# Patient Record
Sex: Male | Born: 1940
Health system: Southern US, Community
[De-identification: ages and names within clinical notes are randomized; demographics above are authoritative.]

## PROBLEM LIST (undated history)

## (undated) DIAGNOSIS — D126 Benign neoplasm of colon, unspecified: Secondary | ICD-10-CM

## (undated) DIAGNOSIS — I10 Essential (primary) hypertension: Secondary | ICD-10-CM

## (undated) DIAGNOSIS — Z87442 Personal history of urinary calculi: Secondary | ICD-10-CM

## (undated) DIAGNOSIS — E119 Type 2 diabetes mellitus without complications: Secondary | ICD-10-CM

## (undated) DIAGNOSIS — Z862 Personal history of diseases of the blood and blood-forming organs and certain disorders involving the immune mechanism: Secondary | ICD-10-CM

## (undated) DIAGNOSIS — J449 Chronic obstructive pulmonary disease, unspecified: Secondary | ICD-10-CM

## (undated) DIAGNOSIS — D696 Thrombocytopenia, unspecified: Secondary | ICD-10-CM

## (undated) DIAGNOSIS — J324 Chronic pansinusitis: Secondary | ICD-10-CM

## (undated) DIAGNOSIS — D693 Immune thrombocytopenic purpura: Secondary | ICD-10-CM

## (undated) DIAGNOSIS — D6959 Other secondary thrombocytopenia: Secondary | ICD-10-CM

## (undated) DIAGNOSIS — E785 Hyperlipidemia, unspecified: Secondary | ICD-10-CM

## (undated) DIAGNOSIS — J3489 Other specified disorders of nose and nasal sinuses: Secondary | ICD-10-CM

## (undated) DIAGNOSIS — J90 Pleural effusion, not elsewhere classified: Secondary | ICD-10-CM

## (undated) HISTORY — PX: ESOPHAGOGASTRODUODENOSCOPY: SHX5428

## (undated) HISTORY — PX: COLONOSCOPY: SHX174

## (undated) HISTORY — DX: Chronic pansinusitis: J32.4

## (undated) HISTORY — PX: OTHER SURGICAL HISTORY: SHX169

---

## 1998-04-09 ENCOUNTER — Encounter: Payer: Self-pay | Admitting: Internal Medicine

## 1998-04-09 ENCOUNTER — Inpatient Hospital Stay (HOSPITAL_COMMUNITY): Admission: EM | Admit: 1998-04-09 | Discharge: 1998-04-11 | Payer: Self-pay | Admitting: Internal Medicine

## 1998-06-10 ENCOUNTER — Ambulatory Visit (HOSPITAL_COMMUNITY): Admission: RE | Admit: 1998-06-10 | Discharge: 1998-06-10 | Payer: Self-pay | Admitting: Gastroenterology

## 2001-08-30 ENCOUNTER — Encounter (INDEPENDENT_AMBULATORY_CARE_PROVIDER_SITE_OTHER): Payer: Self-pay | Admitting: Specialist

## 2001-08-30 ENCOUNTER — Ambulatory Visit (HOSPITAL_COMMUNITY): Admission: RE | Admit: 2001-08-30 | Discharge: 2001-08-30 | Payer: Self-pay | Admitting: Gastroenterology

## 2003-11-18 ENCOUNTER — Inpatient Hospital Stay (HOSPITAL_COMMUNITY): Admission: EM | Admit: 2003-11-18 | Discharge: 2003-11-21 | Payer: Self-pay | Admitting: Emergency Medicine

## 2008-11-22 ENCOUNTER — Encounter: Payer: Self-pay | Admitting: Emergency Medicine

## 2008-11-22 ENCOUNTER — Inpatient Hospital Stay (HOSPITAL_COMMUNITY): Admission: EM | Admit: 2008-11-22 | Discharge: 2008-11-24 | Payer: Self-pay | Admitting: Urology

## 2008-12-02 ENCOUNTER — Ambulatory Visit (HOSPITAL_BASED_OUTPATIENT_CLINIC_OR_DEPARTMENT_OTHER): Admission: RE | Admit: 2008-12-02 | Discharge: 2008-12-02 | Payer: Self-pay | Admitting: Urology

## 2010-08-06 LAB — GLUCOSE, CAPILLARY: Glucose-Capillary: 158 mg/dL — ABNORMAL HIGH (ref 70–99)

## 2010-08-06 LAB — POCT I-STAT 4, (NA,K, GLUC, HGB,HCT)
Glucose, Bld: 172 mg/dL — ABNORMAL HIGH (ref 70–99)
HCT: 40 % (ref 39.0–52.0)
Hemoglobin: 13.6 g/dL (ref 13.0–17.0)
Potassium: 4.5 mEq/L (ref 3.5–5.1)
Sodium: 136 mEq/L (ref 135–145)

## 2010-08-07 LAB — BASIC METABOLIC PANEL
BUN: 23 mg/dL (ref 6–23)
CO2: 25 mEq/L (ref 19–32)
Calcium: 8.8 mg/dL (ref 8.4–10.5)
Calcium: 8.2 mg/dL — ABNORMAL LOW (ref 8.4–10.5)
Calcium: 8.5 mg/dL (ref 8.4–10.5)
Chloride: 101 mEq/L (ref 96–112)
Creatinine, Ser: 1.45 mg/dL (ref 0.4–1.5)
GFR calc Af Amer: 45 mL/min — ABNORMAL LOW (ref >60)
GFR calc Af Amer: 59 mL/min — ABNORMAL LOW (ref >60)
GFR calc non Af Amer: 39 mL/min — ABNORMAL LOW (ref >60)
GFR calc non Af Amer: 37 mL/min — ABNORMAL LOW (ref >60)
GFR calc non Af Amer: 49 mL/min — ABNORMAL LOW (ref >60)
Glucose, Bld: 169 mg/dL — ABNORMAL HIGH (ref 70–99)
Glucose, Bld: 193 mg/dL — ABNORMAL HIGH (ref 70–99)
Potassium: 4 mEq/L (ref 3.5–5.1)
Potassium: 4.3 mEq/L (ref 3.5–5.1)
Sodium: 133 mEq/L — ABNORMAL LOW (ref 135–145)

## 2010-08-07 LAB — URINALYSIS, ROUTINE W REFLEX MICROSCOPIC
Bilirubin Urine: NEGATIVE
Glucose, UA: 100 mg/dL — AB
Ketones, ur: 15 mg/dL — AB
Nitrite: POSITIVE — AB
Protein, ur: 100 mg/dL — AB
Specific Gravity, Urine: 1.026 (ref 1.005–1.030)
Urobilinogen, UA: 1 mg/dL (ref 0.0–1.0)
pH: 5.5 (ref 5.0–8.0)

## 2010-08-07 LAB — CBC
HCT: 34.6 % — ABNORMAL LOW (ref 39.0–52.0)
Hemoglobin: 11 g/dL — ABNORMAL LOW (ref 13.0–17.0)
MCHC: 34.1 g/dL (ref 30.0–36.0)
Platelets: 184 10*3/uL (ref 150–400)
RBC: 3.45 MIL/uL — ABNORMAL LOW (ref 4.22–5.81)
RDW: 14.4 % (ref 11.5–15.5)
RDW: 14.7 % (ref 11.5–15.5)

## 2010-08-07 LAB — LACTIC ACID, PLASMA: Lactic Acid, Venous: 1.9 mmol/L (ref 0.5–2.2)

## 2010-08-07 LAB — URINE CULTURE

## 2010-08-07 LAB — CULTURE, BLOOD (ROUTINE X 2)

## 2010-08-07 LAB — GLUCOSE, CAPILLARY
Glucose-Capillary: 149 mg/dL — ABNORMAL HIGH (ref 70–99)
Glucose-Capillary: 165 mg/dL — ABNORMAL HIGH (ref 70–99)
Glucose-Capillary: 165 mg/dL — ABNORMAL HIGH (ref 70–99)

## 2010-08-07 LAB — DIFFERENTIAL
Basophils Absolute: 0 10*3/uL (ref 0.0–0.1)
Basophils Relative: 0 % (ref 0–1)
Eosinophils Relative: 0 % (ref 0–5)
Lymphocytes Relative: 4 % — ABNORMAL LOW (ref 12–46)

## 2010-09-13 NOTE — Discharge Summary (Signed)
NAMEKAPENA, HAMME              ACCOUNT NO.:  [id number]   MEDICAL RECORD NO.:  [id number]          PATIENT TYPE:  INP   LOCATION:  1435                         FACILITY:  [facility]   PHYSICIAN:  Ronald L. Earlene Plater, M.D.  DATE OF BIRTH:  1940-08-01   DATE OF ADMISSION:  11/22/2008  DATE OF DISCHARGE:  11/24/2008                               DISCHARGE SUMMARY   ADMISSION DIAGNOSIS:  Left ureterolysis with hydronephrosis and  urosepsis.   Operative report was cystourethroscopy, left renal pelvic culture and  sensitivity, and placement of left Polaris double-J stent.   The patient has been maintained since admission of November 22, 2008, on IV  antibiotics of Rocephin 1 g q.24 h. and Cipro 400 mg IV q.24 h.  The  patient appears to be less acute today, states no complaints of nausea  or vomiting.  He is voiding well without any difficulty.   LABORATORY DATA:  BMET today is sodium 134, potassium 4.6, chloride 101,  CO2 27, glucose 169, BUN 20, creatinine 1.45 which is an improvement of  admission creatinine of 1.76.  CBC on November 23, 2008, showed a WBC of  15.7, hemoglobin 11.0, hematocrit of 32.3.  Urine culture from the left  renal pelvis is still pending.   ASSESSMENT AND PLAN:  Status post cystourethroscopy, left retrograde  pyelogram and placement of double-J stent.  He will be discharged home  with Cipro 500 mg 1 p.o. b.i.d., oxycodone 500 mg 1 to 2 p.o. q.4 h.  p.r.n., diazepam 5 mg 1 p.o. q.8 h. p.r.n.  He will follow up in the  office this week with either myself, Dr. Earlene Plater or Cammy Copa,  F.N.P, for an office visit KUB.  If the stone is still present, will  schedule for cystourethroscopy, left retrograde pyelogram, stone  extraction and possible reinsertion of double-J stent by Dr. Earlene Plater.  If  his stone has passed, then he will have the stent removed in our office.      Jetta Lout, NP      Lucrezia Starch. Earlene Plater, M.D.  Electronically Signed    DW/MEDQ  D:  11/24/2008   T:  11/24/2008  Job:  [id number]

## 2010-09-13 NOTE — Op Note (Signed)
[name], [name]              ACCOUNT NO.:  [id number]   MEDICAL RECORD NO.:  [id number]          PATIENT TYPE:  AMB   LOCATION:  NESC                         FACILITY:  [facility]   PHYSICIAN:  Ronald L. Earlene Plater, M.D.  DATE OF BIRTH:  1941/03/14   DATE OF PROCEDURE:  12/02/2008  DATE OF DISCHARGE:                               OPERATIVE REPORT   DIAGNOSIS:  Left distal ureterolithiasis.   OPERATIVE PROCEDURE:  Cystourethroscopy, removal of and replacement of  left double-J stent.  Left ureteroscopy with holmium laser lithotripsy  and basket stone extraction.   SURGEON:  Gaynelle Arabian, M.D.   ANESTHESIA:  LMA.   ESTIMATED BLOOD LOSS:  Negligible.   TUBES:  26-cm 6-French contour double pigtail stent with a string.   COMPLICATIONS:  None.   INDICATIONS FOR PROCEDURE:  [name] is a very nice 70 year old  white male who presented with left-flank pain, fever, nausea and  vomiting.  He emergently underwent cystoscopy and placement of a left  double-J stent and has been treated with antibiotics and subsequently  defervesced.  He comes in now for definitive treatment of the stone.  He  understands risks, benefits and alternatives and has elected to proceed  with the above procedure.   PROCEDURE IN DETAIL:  The patient was placed in supine position.  After  proper LMA anesthesia, he was placed in the dorsal lithotomy position  and prepped and draped with Betadine in a sterile fashion.  Cystourethroscopy was performed with a 22.5 [place] Olympus panendoscope.  He had significant trilobar hypertrophy and a very high bladder neck.  He had grade 1 trabeculation of the bladder.  The Polaris stent was  extracted through the meatus and a 0.038 French sensor wire was placed  in the left renal pelvis.  The distal ureter was then dilated with the  inner dilating sheath of a short ureteral access sheath.  It was quite  tight and had been noted previously and, utilizing a short, thin  semiflexible ureteroscope, ureteroscopy was performed to the stone.  The  stone was fragmented with a holmium laser.  A 365 micron laser fiber was  utilized and a repetition rate of 5 and a setting of 0.5.  The stone was  broken into small fragments which were serially extracted with a nitinol  basket.  Reinspection of the lower two-thirds ureter revealed there were  no significant fragments noted.  Therefore, we felt to was safe to stop  at that point.   The ureteroscope was visually removed and, under fluoroscopic guidance a  26 cm, 6-French contour double pigtail stent was placed with a pullout  string and noted be in good position within the left renal pelvis and  within the bladder.  Bladder was drained.  The panendoscope was removed.  Stent location was confirmed fluoroscopically.  The stone will be given  to the patient for stone analysis and the patient was taken to the  recovery room, stable.      Ronald L. Earlene Plater, M.D.  Electronically Signed     RLD/MEDQ  D:  12/02/2008  T:  12/02/2008  Job:  [id number]

## 2010-09-13 NOTE — Op Note (Signed)
[name], [name]              ACCOUNT NO.:  [id number]   MEDICAL RECORD NO.:  [id number]          PATIENT TYPE:  INP   LOCATION:  1435                         FACILITY:  [facility]   PHYSICIAN:  Ronald L. Earlene Plater, M.D.  DATE OF BIRTH:  Sep 21, 1940   DATE OF PROCEDURE:  11/22/2008  DATE OF DISCHARGE:                               OPERATIVE REPORT   DIAGNOSIS:  Left ureterolithiasis with hydronephrosis and urosepsis.   OPERATIVE PROCEDURE:  Cystourethroscopy, left renal pelvic culture and  sensitivity, placement of left Polaris double-J stent.   SURGEON:  Gaynelle Arabian, M.D.   ANESTHESIA:  LMA.   BLOOD LOSS:  Negligible.   TUBES:  A 26 cm 5-French Polaris stent.   COMPLICATIONS:  None.   PROCEDURE:  [name] is a very nice 70 year old white male. He  presents to the emergency room tonight with a 3-day history of left  flank pain, nausea, vomiting and fever.  He was found on CT scan to have  a 6.5 x 3.0 mm left distal ureteral calculus with hydroureteronephrosis.  He had fever to 101.6 degrees Fahrenheit and very high pulse and  appeared to have infected urine.  After understanding risks, benefits  and alternatives, being given 2 grams of Rocephin IV, he was taken to  surgery.   PROCEDURE IN DETAIL:  Patient is placed in supine position after proper  LMA anesthesia.  Was placed in the dorsal lithotomy position, prepped  and draped with Betadine in a sterile fashion.  Cystourethroscopy was  performed with a 22.5 [place] Olympus pan endoscope.  He was noted to  have moderate trilobar hypertrophy, and the bladder was somewhat  trabeculated, grade 1.  Both ureteral orifices were identified, and no  other lesions were noted.  Under fluoroscopic guidance, a 0.3 French  sensor wire was placed in the left renal pelvis, and a hydronephrotic  drip flush was noted.  A 6-French open-ended catheter was placed into  the left renal pelvis.  Urine was not purulent, but it was concentrated.  Was obtained and sent for culture and sensitivity.  I did not inject  retrograde ureteral pyelogram, due to the infection, I did not want to  spread the infection and create high pressures within the left renal  pelvis.  The wire was replaced.  Under fluoroscopic guidance, a 26-cm 5-  [place] Polaris stent was placed and noted be in good position within the  left renal pelvis and within the bladder.  The bladder was drained.  The  pan endoscope was removed.  Fluoroscopic confirmation of the stent was  performed.  The patient was taken to the recovery room stable.     Ronald L. Earlene Plater, M.D.  Electronically Signed    RLD/MEDQ  D:  11/22/2008  T:  11/23/2008  Job:  [id number]

## 2010-09-16 NOTE — Discharge Summary (Signed)
NAMECHRISTIANJAMES, [name]                          ACCOUNT NO.:  [id number]   MEDICAL RECORD NO.:  [id number]                   PATIENT TYPE:  INP   LOCATION:  3704                                 FACILITY:  [facility]   PHYSICIAN:  Deirdre Peer. Polite, M.D.              DATE OF BIRTH:  10-05-1940   DATE OF ADMISSION:  11/18/2003  DATE OF DISCHARGE:  11/21/2003                                 DISCHARGE SUMMARY   DISCHARGE DIAGNOSES:  1. Gastrointestinal bleed, unknown source.     a. Esophagogastroduodenoscopy which showed small hiatal hernia, fluid in        her stomach, no blood seen.     b. Colonoscopy which showed diverticular disease but no obvious bleed.        The patient underwent Meckel's scan which is pending at  the time of        this dictation and will be followed by [facility].  2. Anemia secondary to #1 requiring two units of packed red blood cells,     hemoglobin nadir of 8.1 and 11.6 on admission.  3. Diabetes, poor control secondary to noncompliance.  A1C this     hospitalization is 8.7.  4. Hyperlipidemia.  5. History of plantar fascitis.  6. Hypertension.   DISCHARGE MEDICATIONS:  1. The patient is asked to resume home medications which include Glucophage     500 mg b.i.d.  2. Glucotrol XL 10 mg q.d.  3. Actos 45 mg q.d.  4. Lisinopril 20 mg q.d.  5. Lipitor q.d.  6. Allegra q.d.  7. The patient is not to take aspirin.   DISPOSITION:  The patient is discharged to home in stable condition.  Asked  to follow up with his primary doctor in one week.  Asked to follow up with  Dr. Fayrene Fearing L. Edwards in approximately one to two weeks.   LABORATORY DATA:  A CBC significant for hemoglobin 11.6 on admission with a  nadir of 8.1.  Hemoglobin at discharge post two units of packed red blood  cells is 10.  BMET on admission significant for hyperkalemia of 6.0,  creatinine 1.1.  No hemolysis reported.  Follow-up lab of 3.5, hemoglobin  A1C of 8.7.   EGD which revealed  hiatal hernia, no active bleeding.  Colonoscopy revealed  diverticula.  No active bleeding.  Meckel's scan pending at the time of this  dictation.   HISTORY OF PRESENT ILLNESS:  A 70 year old male with the above medical  problems who presented to the hospital with complaints of blood per rectum.  In the ED, the patient was evaluated and admission was deemed necessary for  further evaluation and treatment.  Please see dictated H&P for further  details.   PAST MEDICAL HISTORY:  As stated above.   MEDICATIONS:  1. Glucophage.  2. Glucotrol XL.  3. Actos.  4. Lisinopril.  5. Lipitor.  6. Allegra D.  7. Enteric-coated  aspirin.   SOCIAL HISTORY:  Positive for tobacco, social alcohol.  No drugs.   FAMILY HISTORY:  Per admission H&P.   HOSPITAL COURSE:  The patient was admitted to the medicine floor bed for  evaluation and treatment of GI bleed.  The patient was seen in consultation  by GI.  The patient had serial H&H.  The patient underwent EGD, colonoscopy  with results as stated above.  It was noted that the patient's hemoglobin  did trend down to 8.1.  The patient did have significant tachycardia with  activity;  therefore, transfusion was indicated.  The patient was transfused  two units of packed red blood cells which he tolerated well.  Follow-up  hemoglobin of 10.0.   As stated, the patient's EGD and colonoscopy did not reveal a definitive  source for bleeding.  The patient's colonoscopy did show a diverticular  disease which could have possibly the cause of his current bleed.  The  patient did undergo a Meckel's scan which is pending at the time of this  dictation.  The patient has been cleared for discharge by GI.  Dr. Althea Grimmer.  Santogade will follow up with Meckel's scan.  If abnormal results, relay  those to the patient's primary care doctor at this time.  The patient is  continuing today with his discharge.   The patient has several other medical problems which have  been exacerbated  secondary to his noncompliance.  The patient has been informed of the need  to resume his outpatient medications.                                                Deirdre Peer. Polite, M.D.    RDP/MEDQ  D:  11/21/2003  T:  11/22/2003  Job:  [id number]   cc:   Dellis Anes. Idell Pickles, M.D.  [address]  [place]  [place] [address]  Fax: [phone]

## 2010-09-16 NOTE — H&P (Signed)
[name], [name]                          ACCOUNT NO.:  [id number]   MEDICAL RECORD NO.:  [id number]                   PATIENT TYPE:  INP   LOCATION:  3704                                 FACILITY:  [facility]   PHYSICIAN:  Isla Pence, M.D.             DATE OF BIRTH:  05-Jul-1940   DATE OF ADMISSION:  11/18/2003  DATE OF DISCHARGE:                                HISTORY & PHYSICAL   PRIMARY CARE PHYSICIAN:  Dellis Anes. Idell Pickles, M.D.  Gastroenterologist, Dr.  Randa Evens and Dr. Madilyn Fireman.   CHIEF COMPLAINT:  Rectal bleeding and weakness today.   HISTORY OF PRESENT ILLNESS:  This is a 70 year old gentleman with history of  adenomatous polyp last scoped in May 2003, by Dr. Randa Evens, who comes in with  complaints of bright red blood per rectum and hematochezia x3 episodes since  9 a.m. today.  He denies any abdominal pain, no nausea or vomiting, no  shortness of breath or chest pain, but noted some dizziness earlier.  Currently, as I am seeing him up on the floor, he is asymptomatic after  having received IV fluids.  His initial blood pressure in the emergency room  was 82/30, pulse 143, respirations 28, saturations 98% on room air,  temperature 97.9.  His latest blood pressure in the ER was 104/59.  Dr.  Ewing Schlein was on call for GI.  He saw him and went ahead and scoped him and  found on EGD a small amount of food, hiatal hernia, but no active sites of  bleeding.  He is currently on clears with possible plans of colonoscopy in  the morning and I assuming that it will most likely happen.  The patient is  currently asymptomatic.  In addition, to the rectal bleeding the patient was  also noticed to have very high sugar of 436.  I had given some orders to get  him 5 units of regular insulin subcu and then discovered also that his  potassium was elevated.  The repeat potassium came back at 6 and therefore I  gave him 5 units of IV insulin.  I am currently waiting for his repeat  potassium.  The  patient says he had a similar episode of bright red rectal  bleeding about 4 years ago and at that time they apparently only found  traces of blood and he did have a colonoscopy, but unsure of what the  finding was at that time.   ALLERGIES:  No known drug allergies.   CURRENT MEDICATIONS:  The patient says he only takes Celebrex and he does  not know the dose, but apparently is the smallest of the dose once a day.  He has been on it for about 2-3 years and he takes this for plantar  fascitis.  Approximately 3 weeks ago, he stopped his Glucophage.  He thinks  it is 500 mg p.o. b.i.d.  He also stopped his Glucotrol XL,  unsure of the  dose.  Actos, unsure of dose.  Lisinopril, unsure of dose, Lipitor, unsure  of dose and Allegra D.  He also has not been compliant with his aspirin.  Ecotrin 81 mg.  The patient says he started on a low carbohydrate diet in  January 2005, and during his physical in April 2005, his sugars and  cholesterol was the best that he ever had and therefore he made the decision  on his own to just stop the medications.  Dr. Idell Pickles was not notified when  he stopped the medications, although he had made mention of it per the  patient to Dr. Idell Pickles, but Dr. Idell Pickles had told him to see how he does in  April before a decision was made.  Once again, the patient stopped these  medications on his own.   PAST MEDICAL HISTORY:  1. Diabetes mellitus x15 years.  2. Hyperlipidemia.  3. Denies history of hypertension and notes that he is on lisinopril as     preventative measure and I am assuming it is for diabetes.  4. Plantar fascitis for which he sees a podiatrist.  5. History of adenomatous polyp.  He last underwent his surveillance     colonoscopy in May 2003, by Dr. Randa Evens.  At that time, a polyp was     discovered in his descending colon, colon and rectum.  The rectal polyp     was hyperplastic.  The descending colon was adenomatous.  6. Denies history of MI.   PAST  SURGICAL HISTORY:  1. Superficial cyst removed from his spine.  I am thinking that maybe it is     a pilonidal cyst from many years ago.  2. Dental extractions.   SOCIAL HISTORY:  He has been married for the past 40 odd years.  He has  three kids.  He works as a Merchandiser, retail.  He smokes 1/2 pack per day for the  past 7-8 years.  He use to do a pack per day for about 30 odd years.  He  drinks an occasional drink and might have three drinks per week, not all at  the same time.   FAMILY HISTORY:  Colon cancer in the mother.  She was diagnosed in her 36s.  She died from stroke at the age of 32.  She had a few strokes from what he  tells me.  Myocardial infarction in the mother in her 42s.  Coronary artery  disease without an MI in the father in his 63s.  The father died at age 89  secondary to cancer of an unknown type.  There is diabetes mellitus in the  maternal grandfather who died from complications of diabetes.  There is no  hypertension.  There is no prostate cancer.   REVIEW OF SYMPTOMS:  As per HPI.  He otherwise denies polyphagia, polyuria,  although he says he has had polydipsia which he says is new for him, but has  not really noticed any significant difference since stopping his diabetes  medications.  Denies any burning on urination.   PHYSICAL EXAMINATION:  VITAL SIGNS:  Temperature 97.3, pulse 107,  respirations 22, blood pressure 104/70, saturations 95% on room air.  His  latest sugar done about 6:55 p.m. was 328.  This is down from 436.  GENERAL:  He is in no apparent distress.  LUNGS:  Clear to auscultation bilaterally without any crackles or wheezes.  HEART:  Regular rate and rhythm without any appreciable murmurs.  ABDOMEN:  Bowel sounds are normal, soft, nontender.  There is no  organomegaly appreciated.  He does have a slightly protuberant abdomen  secondary to abdominal obesity.  NEUROLOGIC:  No gross deficits. RECTAL:  Not done since he had bright red blood per  rectum.   LABORATORY DATA AND X-RAY FINDINGS:  Initial H&H was 11.9 and 35.  Repeat  H&H on full CBC shows H&H of 11.6 and 33.9, WBC 8.2, platelet count normal  at 235,000, differential normal, MCV 93.8, MCHC 34.3.  His initial glucose  was 436 on a full CMP with sodium 134, potassium 6, chloride 104, CO2 25,  BUN 34, creatinine 1.1.  As mentioned earlier, his CBG shows a sugar of 328  at about 7 p.m.  Pending his stat potassium and stat H&H.  His LFTs were  normal except for protein of 5.2 which is minimally decreased and an albumin  of 3.  His calcium was normal.  No other labs were done through the  emergency room.   An EKG was apparently done in the emergency room, however, I do not have  that in the chart.  The rhythm strip, I do not know which lead this is,  shows sinus tachycardia.   ASSESSMENT/PLAN:  1. Gastrointestinal bleed.  This does not appear to be upper     gastrointestinal.  May be from a lower gastrointestinal.  My guess is     that he will probably need a colonoscopy especially given his history of     adenomatous polyp.  The gastroenterology service will decide on this.     Will keep him on clear liquids for now.  He has already been started on     Protonix.  2. Diabetes mellitus.  Since he is just on clears, I am going to go ahead     with insulin coverage and continue his intravenous fluids.  This seemed     to be bringing his sugars down.  The wife is going to bring in his     medication list so we can restart him back on many of his medications     once he is taking some oral food.  I am holding his lisinopril which I     understand it may be for preventative measure for diabetes secondary to     his lower blood pressures.  I have also held his aspirin for now because     of his gastrointestinal bleeding.  3. Mild hyperkalemia.  It may be mostly likely secondary to the increase in     his sugar.  The insulin that he has been given ought to bring it down.  I      have not given him any Lasix with his blood pressure being down secondary     to the gastrointestinal bleed.  This would not be a good idea.  We may     end up needing to do Kayexalate or just continue with the insulin     intravenously.  If a 12-lead electrocardiogram was not in the emergency     room, I will go ahead and order the 12-lead electrocardiogram.  4. History of hyperlipidemia.  There is no acute urgent need to restart his     Lipitor.  Once we get his correct dose, we will restart him back.  5. Plantar fascitis.  He has been on Celebrex.  I am going to hold it for     now secondary to his  initial complaints with gastrointestinal bleeding.     However, I had advised against the Celebrex because he is high risk for     coronary artery disease, although he has been on it x2-3 years and has     done pretty good.  He says he is on a small dose and from what he read,    the small dose is okay.  He tells me that Dr. Idell Pickles was okay with it     also.  I will leave this between him and Dr. Idell Pickles.  6. Deep venous thrombosis prophylaxis.  We will do PAS hose.  7. Gastrointestinal prophylaxis.  We will place him on Protonix which he has     already been receiving, since he also does have a hiatal hernia.  8. I have advised the patient that he really ought not to stop any of his     medications without getting an okay from his physicians.  The patient is     now aware of the fact that he should not have stopped his medications and     not gone without checking his sugars at all after he stopped his diabetes     related pills.                                                Isla Pence, M.D.    RRV/MEDQ  D:  11/18/2003  T:  11/18/2003  Job:  [id number]   cc:   Dellis Anes. Idell Pickles, M.D.  [address]  [place]  [place] [address]  Fax: [phone]   Llana Aliment. Malon Kindle., M.D.  [address]  [place]  [place] [address]  Fax: [phone]   Everardo All. Madilyn Fireman, M.D.  [address]  [place]  [place] [address]  Fax: [phone]

## 2010-09-16 NOTE — Procedures (Signed)
[facility]  Patient:    [name], [name] Visit Number: [id number] MRN: [id number]          Service Type: END Location: ENDO Attending Physician:  Orland Mustard Dictated by:   Llana Aliment. Randa Evens, M.D. Proc. Date: 08/30/01 Admit Date:  08/30/2001 Discharge Date: 08/30/2001   CC:         Dellis Anes. Idell Pickles, M.D.   Procedure Report  DATE OF BIRTH:  17-Dec-1940.  PROCEDURE:  Colonoscopy with polypectomy.  MEDICATIONS:  Fentanyl 50 mcg, Versed 5 mg IV.  SCOPE:  Adult Olympus video colonoscope.  INDICATION:  A previous history of adenomatous polyps.  This is done as a three-year follow-up.  DESCRIPTION OF PROCEDURE:  The procedure had been explained to the patient and consent obtained.  Scope inserted, advanced to the cecum easily.  The ileocecal valve and appendiceal orifice seen.  Scope withdrawn.  Cecum, ascending colon, hepatic flexure, transverse colon, splenic flexure, descending colon seen well.  A 0.75 cm polyp seen at 55 cm, was removed with the snare and recovered.  The scope was withdrawn.  No polyps seen in the sigmoid.  In the rectum a 3 mm sessile polyp was seen, and it was cauterized. The scope withdrawn.  The patient tolerated the procedure well.  ASSESSMENT:  Polyps removed from the descending colon and rectum.  PLAN:  Routine postpolypectomy instructions.  Recommend repeating in three years. Dictated by:   Llana Aliment. Randa Evens, M.D. Attending Physician:  Orland Mustard DD:  08/30/01 TD:  09/02/01 Job: [id number]

## 2010-09-16 NOTE — Op Note (Signed)
[name], [name]                          ACCOUNT NO.:  [id number]   MEDICAL RECORD NO.:  [id number]                   PATIENT TYPE:  INP   LOCATION:  3704                                 FACILITY:  [facility]   PHYSICIAN:  James L. Malon Kindle., M.D.          DATE OF BIRTH:  1941/03/24   DATE OF PROCEDURE:  11/20/2003  DATE OF DISCHARGE:                                 OPERATIVE REPORT   PROCEDURE:  Colonoscopy.   MEDICATIONS GIVEN:  Fentanyl 75 mcg, Versed 7.5 mg IV.   INSTRUMENT USED:  Olympus pediatric colonoscope.   INDICATIONS FOR PROCEDURE:  The patient had an adenomatous polyps removed in  2002 with a few scattered diverticula.  He came in with a GI bleed.  Upper  endoscopy was normal.  The bright red blood appears to have cleared up even  though he has dropped his hemoglobin down to 8.  He notes that his stool was  finally clear.  This is done to look for a lower GI source.   DESCRIPTION OF PROCEDURE:  The procedure was explained and patient consent  obtained.  With the patient in the left lateral decubitus position, the  scope was inserted following digital exam and advanced.  The prep was quite  good.  We were able to easily advance to the cecum where the ileocecal valve  was seen and entered for a short distance, approximately 2 cm, and was  normal.  There was no blood seen.  The cecum was free of AVMs or any active  bleeding.  The ascending colon,  hepatic flexure, transverse colon,  descending colon, and sigmoid were free of polyps or any other lesions.  In  the sigmoid colon, there was moderate diverticulosis without active  bleeding.  There were no polyps or other lesions seen.  The rectum was free  of bleeding or polyps.  The scope was withdrawn, the patient tolerated the  procedure well.   ASSESSMENT:  1. GI bleed of unclear source, main considerations at this point are     diverticulosis which is the most likely source, Meckel's diverticulum or     some other  small bowel source.  2. Moderate diverticulosis without active bleeding.   PLAN:  Will follow the patient clinically.  Will obtain a Meckel's scan.  Will repeat colon in five years due to his history of previous adenomatous  polyps.                                               James L. Malon Kindle., M.D.    Waldron Session  D:  11/20/2003  T:  11/21/2003  Job:  [id number]   cc:   Dellis Anes. Idell Pickles, M.D.  [address]  [place]  [place] [address]  Fax: [phone]

## 2010-09-16 NOTE — Consult Note (Signed)
NAME:  ISSAIH, KAUS NO.:  [id number]   MEDICAL RECORD NO.:  [id number]                   PATIENT TYPE:  INP   LOCATION:  3704                                 FACILITY:  [facility]   PHYSICIAN:  Petra Kuba, M.D.                 DATE OF BIRTH:  1941/01/20   DATE OF CONSULTATION:  11/18/2003  DATE OF DISCHARGE:                                   CONSULTATION   GASTROENTEROLOGY CONSULTATION:   HISTORY:  Patient known to both Dr. Madilyn Fireman and Dr. Randa Evens with a colonoscopy  a few years ago for colon polyps and a family history of colon cancer who  had had no GI symptoms until today when he began passing bright red blood  per rectum.  He has had no upper tract symptoms, no change in bowel habits,  does take Celebrex but has not been on his usual aspirin.  He did have a  bout of GI bleeding in 2000, had some duodenitis but a nondiagnostic workup  otherwise but has had no bleeding since.   PAST MEDICAL HISTORY:  His past medical history is pertinent for diabetes,  high blood pressure, colon polyps as well as the bleeding mentioned above.  He did have a cyst on his spine removed.   ALLERGIES:  No known allergies.   SOCIAL HISTORY:  He does drink and smoke.  Minimizes over-the-counter  medicine use.   CURRENT MEDICATIONS:  Celebrex only.  He is off his blood pressure and his  sugar pill.   FAMILY HISTORY:  Family history is pertinent for some diverticulosis in his  mother and the colon cancer as mentioned above but no ulcers or other  problems.   REVIEW OF SYSTEMS:  Supposedly he had a recent physical.  His labs were okay  but I do not have those reports.   PHYSICAL EXAMINATION:  GENERAL:  No acute distress.  VITALS:  See chart.  NECK:  Supple without adenopathy.  LUNGS:  Clear.  HEART:  Regular rate and rhythm.  ABDOMEN:  Soft, nontender.  EXTREMITIES:  Decreased peripheral pulses.  RECTAL:  Maroon stool per Dr. Margretta Ditty in the ER.   LABORATORIES:   Labs reported to me with a hemoglobin of 11 but increased BUN  of 34, creatinine normal.   ASSESSMENT:  1. Bright red blood per rectum.  2. History of obscured gastrointestinal bleeding questionable etiology in     2000.  3. Family history of colon cancer.  4. Personal history of colon polyps/colonoscopy in 2003.  5. Diabetes mellitus.  6. High blood pressure.   PLAN:  We will go ahead and do an upper endo since blood was seen on the  last time and his BUN is up, the risk/benefits and methods of that were  discussed and impending those findings further workup and plans.  In the  meantime keep him on clear liquids after the endoscopy,  follow H&H and  number of stools, etc., consider a nuclear bleeding scan p.r.n.                                               Petra Kuba, M.D.    MEM/MEDQ  D:  11/18/2003  T:  11/18/2003  Job:  [id number]   cc:   Fayrene Fearing L. Malon Kindle., M.D.  [address], [address]  [place]  [place] [address]  Fax: [phone]   Dellis Anes. Idell Pickles, M.D.  [address]  [place]  [place] [address]  Fax: [phone]

## 2010-09-16 NOTE — Op Note (Signed)
[name], BUSBEE NO.:  [id number]   MEDICAL RECORD NO.:  [id number]                   PATIENT TYPE:  EMS   LOCATION:  MAJO                                 FACILITY:  [facility]   PHYSICIAN:  Petra Kuba, M.D.                 DATE OF BIRTH:  [birth year]   DATE OF PROCEDURE:  11/18/2003  DATE OF DISCHARGE:                                 OPERATIVE REPORT   PROCEDURE:  Esophagogastroduodenoscopy.   ENDOSCOPIST:  Petra Kuba, M.D.   INDICATION:  GI bleeding with increased BUN.  I want to make sure it is not  an upper source.  He did have what sounds like an upper source in 2000.   INFORMED CONSENT:  Consent was signed after risks, benefits, methods and  options were thoroughly discussed prior to any pre-medications given.   MEDICATIONS USED:  Demerol 30 mg, Versed 4 mg.   PROCEDURE:  The video endoscope was inserted by direct vision.  The  esophagus was normal.  He did have a small hiatal hernia.  Scope passed into  the stomach, where some old food was seen but no blood, advanced through a  normal antrum and normal pylorus into the duodenal bulb, where possibly 1  small bulb erosion was seen but no blood, advanced around the C loop to a  normal second portion of the duodenum; green bile was seen and no blood  distally.  Scope was withdrawn back to the bulb and a good look there ruled  out any __________  lesions in that location.  Scope was withdrawn back into  the stomach and retroflexed.  The food filled the cardia, fundus and  proximal stomach but no obvious abnormality was seen.  The angularis was  normal.  Straight visualization of the stomach did not reveal any additional  findings and no signs of bleeding.  The air was suctioned and scope slowly  withdrawn again.  A good look at the esophagus was normal.  Scope was  removed.  Patient tolerated procedure well.  There was no obvious immediate  complication.   ENDOSCOPIC DIAGNOSES:  1. Small  hiatal hernia.  2. Old blood in the stomach.  3. No blood seen throughout the exam.  4. Small -- doubt significant -- bulb erosion.  5. Otherwise normal esophagogastroduodenoscopy.   PLAN:  Clear liquid diet.  Follow hemoglobin and hematocrit and BUN, number  of stools and color.  Consideration of rechecking his colon per Dr. Fayrene Fearing L.  [place] tomorrow.  May get a nuclear bleeding scan if signs of active  bleeding and possibly even a small-bowel follow-through or capsule endoscopy  1 time to rule out other etiologies.  Petra Kuba, M.D.    MEM/MEDQ  D:  11/18/2003  T:  11/19/2003  Job:  [id number]   cc:   Dellis Anes. Idell Pickles, M.D.  [address]  [place]  [place] [address]  Fax: [phone]   Llana Aliment. Malon Kindle., M.D.  [address], [address]  [place]  [place] [address]  Fax: [phone]

## 2011-05-19 ENCOUNTER — Ambulatory Visit (HOSPITAL_COMMUNITY)
Admission: RE | Admit: 2011-05-19 | Discharge: 2011-05-19 | Disposition: A | Discharge status: Home / Self Care | Payer: Medicare Other | Source: Ambulatory Visit | Attending: Gastroenterology | Admitting: Gastroenterology

## 2011-05-19 ENCOUNTER — Encounter (HOSPITAL_COMMUNITY): Payer: Self-pay | Admitting: *Deleted

## 2011-05-19 ENCOUNTER — Encounter (HOSPITAL_COMMUNITY)
Admission: RE | Disposition: A | Discharge status: Home / Self Care | Payer: Self-pay | Source: Ambulatory Visit | Attending: Gastroenterology

## 2011-05-19 DIAGNOSIS — E7800 Pure hypercholesterolemia, unspecified: Secondary | ICD-10-CM | POA: Insufficient documentation

## 2011-05-19 DIAGNOSIS — K921 Melena: Secondary | ICD-10-CM | POA: Insufficient documentation

## 2011-05-19 DIAGNOSIS — E119 Type 2 diabetes mellitus without complications: Secondary | ICD-10-CM | POA: Insufficient documentation

## 2011-05-19 DIAGNOSIS — Z79899 Other long term (current) drug therapy: Secondary | ICD-10-CM | POA: Insufficient documentation

## 2011-05-19 DIAGNOSIS — K2980 Duodenitis without bleeding: Secondary | ICD-10-CM | POA: Insufficient documentation

## 2011-05-19 DIAGNOSIS — D649 Anemia, unspecified: Secondary | ICD-10-CM | POA: Insufficient documentation

## 2011-05-19 DIAGNOSIS — I10 Essential (primary) hypertension: Secondary | ICD-10-CM | POA: Insufficient documentation

## 2011-05-19 HISTORY — PX: ESOPHAGOGASTRODUODENOSCOPY: SHX5428

## 2011-05-19 HISTORY — DX: Benign neoplasm of colon, unspecified: D12.6

## 2011-05-19 HISTORY — PX: HOT HEMOSTASIS: SHX5433

## 2011-05-19 HISTORY — DX: Other specified disorders of nose and nasal sinuses: J34.89

## 2011-05-19 HISTORY — DX: Hyperlipidemia, unspecified: E78.5

## 2011-05-19 LAB — CBC
MCH: 30.9 pg (ref 26.0–34.0)
MCHC: 34.7 g/dL (ref 30.0–36.0)
Platelets: 215 10*3/uL (ref 150–400)
RBC: 2.98 MIL/uL — ABNORMAL LOW (ref 4.22–5.81)
RDW: 13.7 % (ref 11.5–15.5)

## 2011-05-19 LAB — GLUCOSE, CAPILLARY: Glucose-Capillary: 184 mg/dL — ABNORMAL HIGH (ref 70–99)

## 2011-05-19 SURGERY — EGD (ESOPHAGOGASTRODUODENOSCOPY)
Anesthesia: Moderate Sedation

## 2011-05-19 MED ORDER — BUTAMBEN-TETRACAINE-BENZOCAINE 2-2-14 % EX AERO
INHALATION_SPRAY | CUTANEOUS | Status: DC | PRN
  Administered 2011-05-19: 2 via TOPICAL

## 2011-05-19 MED ORDER — SODIUM CHLORIDE 0.9 % IV SOLN
INTRAVENOUS | Status: DC
  Administered 2011-05-19: 500 mL via INTRAVENOUS

## 2011-05-19 MED ORDER — MIDAZOLAM HCL 10 MG/2ML IJ SOLN
INTRAMUSCULAR | Status: DC | PRN
  Administered 2011-05-19 (×3): 2 mg via INTRAVENOUS

## 2011-05-19 MED ORDER — FENTANYL CITRATE 0.05 MG/ML IJ SOLN
INTRAMUSCULAR | Status: AC
  Filled 2011-05-19: qty 4

## 2011-05-19 MED ORDER — SODIUM CHLORIDE 0.9 % IV SOLN
Freq: Once | INTRAVENOUS | Status: AC
  Administered 2011-05-19: 500 mL via INTRAVENOUS

## 2011-05-19 MED ORDER — FENTANYL NICU IV SYRINGE 50 MCG/ML
INJECTION | INTRAMUSCULAR | Status: DC | PRN
  Administered 2011-05-19 (×2): 25 ug via INTRAVENOUS

## 2011-05-19 MED ORDER — MIDAZOLAM HCL 10 MG/2ML IJ SOLN
INTRAMUSCULAR | Status: AC
  Filled 2011-05-19: qty 4

## 2011-05-19 NOTE — Op Note (Signed)
[facility] [address] [place], [place]  [address]  ENDOSCOPY PROCEDURE REPORT  PATIENT:  [name], [name]  MR#:  [id number] BIRTHDATE:  04-05-1941, 70 yrs. old  GENDER:  male  ENDOSCOPIST:  Wandalee Ferdinand, MD Referred by:  PROCEDURE DATE:  05/19/2011 PROCEDURE: Esophagogastroduodenoscopy ASA CLASS: 2 INDICATIONS: Melena, anemia  MEDICATIONS: Fentanyl 50 mcg IV, Versed 6 mg IV  TOPICAL ANESTHETIC:  DESCRIPTION OF PROCEDURE:   After the risks benefits and alternatives of the procedure were thoroughly explained, informed consent was obtained.  The Pentax Gastroscope [id number] endoscope was introduced through the mouth and advanced to the second portion of the duodenum , without limitations.  The instrument was slowly withdrawn as the mucosa was fully examined.  FINDINGS: Esophagus normal  Stomach mild prepyloric erythema and mild patchy erythema in body of stomach  Duodenum: Duodenitis in bulb  No signs of active bleeding  COMPLICATIONS: None  ENDOSCOPIC IMPRESSION: See above  RECOMMENDATIONS: Continue Nexium and Carafate  REPEAT EXAM:  When necessary  ______________________________ Wandalee Ferdinand, MD  cc  DR. Carman Ching  n. eSIGNED:   Sam Haleema Vanderheyden at 05/19/2011 10:32 AM  Hanley Seamen, [phone]

## 2011-05-22 ENCOUNTER — Encounter (HOSPITAL_COMMUNITY): Payer: Self-pay | Admitting: Gastroenterology

## 2011-06-08 ENCOUNTER — Other Ambulatory Visit: Payer: Self-pay | Admitting: Gastroenterology

## 2011-06-09 ENCOUNTER — Ambulatory Visit
Admission: RE | Admit: 2011-06-09 | Discharge: 2011-06-09 | Disposition: A | Discharge status: Home / Self Care | Payer: Medicare Other | Source: Ambulatory Visit | Attending: Gastroenterology | Admitting: Gastroenterology

## 2012-02-08 ENCOUNTER — Other Ambulatory Visit: Payer: Self-pay | Admitting: Otolaryngology

## 2012-02-08 ENCOUNTER — Ambulatory Visit
Admission: RE | Admit: 2012-02-08 | Discharge: 2012-02-08 | Disposition: A | Discharge status: Home / Self Care | Payer: Medicare Other | Source: Ambulatory Visit | Attending: Otolaryngology | Admitting: Otolaryngology

## 2012-02-08 DIAGNOSIS — R05 Cough: Secondary | ICD-10-CM

## 2012-02-15 ENCOUNTER — Encounter: Payer: Self-pay | Admitting: Internal Medicine

## 2012-02-16 ENCOUNTER — Ambulatory Visit (INDEPENDENT_AMBULATORY_CARE_PROVIDER_SITE_OTHER): Payer: Medicare Other | Admitting: Internal Medicine

## 2012-02-16 ENCOUNTER — Encounter: Payer: Self-pay | Admitting: Internal Medicine

## 2012-02-16 VITALS — BP 110/60 | HR 96 | Temp 97.6°F | Ht 71.0 in | Wt 229.0 lb

## 2012-02-16 DIAGNOSIS — R05 Cough: Secondary | ICD-10-CM

## 2012-02-16 DIAGNOSIS — J4489 Other specified chronic obstructive pulmonary disease: Secondary | ICD-10-CM

## 2012-02-16 DIAGNOSIS — J449 Chronic obstructive pulmonary disease, unspecified: Secondary | ICD-10-CM

## 2012-02-16 DIAGNOSIS — I10 Essential (primary) hypertension: Secondary | ICD-10-CM

## 2012-02-16 DIAGNOSIS — R059 Cough, unspecified: Secondary | ICD-10-CM

## 2012-02-16 MED ORDER — MOMETASONE FURO-FORMOTEROL FUM 100-5 MCG/ACT IN AERO: 2.0000 | INHALATION_SPRAY | Freq: Two times a day (BID) | RESPIRATORY_TRACT | Status: DC

## 2012-02-16 MED ORDER — VALSARTAN 80 MG PO TABS: 80.0000 mg | ORAL_TABLET | Freq: Every day | ORAL | Status: DC

## 2012-02-16 NOTE — Patient Instructions (Addendum)
Stop fish oil and lisinopril dulera 100 Take 2 puffs first thing in am and then another 2 puffs about 12 hours later.  For cough mucinex dm 1-2 every 12 hours as needed   GERD (REFLUX)  is an extremely common cause of respiratory symptoms, many times with no significant heartburn at all.    It can be treated with medication, but also with lifestyle changes including avoidance of late meals, excessive alcohol, smoking cessation, and avoid fatty foods, chocolate, peppermint, colas, red wine, and acidic juices such as orange juice.  NO MINT OR MENTHOL PRODUCTS SO NO COUGH DROPS  USE SUGARLESS CANDY INSTEAD (jolley ranchers or Stover's)  NO OIL BASED VITAMINS - use powdered substitutes.   Please schedule a follow up office visit in 4 weeks, sooner if needed with pfts

## 2012-02-16 NOTE — Progress Notes (Signed)
  Subjective:    Patient ID: [name], male    DOB: [birth year]  MRN: [id number]  HPI  30 yowm quit smoking around 2003  With "smoker cough"  And some coughing assoc with  seasonal rhinitis  Which both improved  but since around 2010 worse again with daily cough dx as sinus dz and planning sinus surgery 10/23 and sent by [facility] for pre-op pulmonary eval 02/16/2012   02/16/2012 1st pulmonary eval cc daily cough min prod mucus x sev years with rattling quality  and no sign sob, cough is better while sleeping does not flare first thing in am  Able to walk a mile a day at 2.7 -2.8 at grade 2 -3 s difficulty.  No obvious daytime variabilty or assoc  cp or chest tightness, subjective wheeze overt  hb symptoms. No unusual exp hx or h/o childhood pna/ asthma or premature birth to his knowledge.    Review of Systems  Constitutional: Negative for fever, chills, activity change, appetite change and unexpected weight change.  HENT: Negative for congestion, sore throat, rhinorrhea, sneezing, trouble swallowing, dental problem, voice change and postnasal drip.   Eyes: Negative for visual disturbance.  Respiratory: Positive for cough. Negative for choking and shortness of breath.   Cardiovascular: Negative for chest pain and leg swelling.  Gastrointestinal: Negative for nausea, vomiting and abdominal pain.  Genitourinary: Negative for difficulty urinating.  Musculoskeletal: Negative for arthralgias.  Skin: Negative for rash.  Psychiatric/Behavioral: Negative for behavioral problems and confusion.       Objective:   Physical Exam amb wm with gruff voice Wt 229 HEENT mild turbinate edema.  Oropharynx no thrush or excess pnd or cobblestoning.  No JVD or cervical adenopathy. Mild accessory muscle hypertrophy. Trachea midline, nl thryroid. Chest was hyperinflated by percussion with diminished breath sounds and moderate increased exp time with very coarse sonorous bilateral  Wheeze both prox and distal.  Hoover sign positive at mid inspiration. Regular rate and rhythm without murmur gallop or rub or increase P2 or edema.  Abd: no hsm, nl excursion. Ext warm without cyanosis or clubbing.    cxr 02/08/12 Copd s acute changes    Assessment & Plan:

## 2012-02-17 DIAGNOSIS — I10 Essential (primary) hypertension: Secondary | ICD-10-CM | POA: Insufficient documentation

## 2012-02-17 DIAGNOSIS — J449 Chronic obstructive pulmonary disease, unspecified: Secondary | ICD-10-CM | POA: Insufficient documentation

## 2012-02-17 DIAGNOSIS — R05 Cough: Secondary | ICD-10-CM | POA: Insufficient documentation

## 2012-02-17 NOTE — Assessment & Plan Note (Addendum)
ACE inhibitors are problematic in  pts with airway complaints because  even experienced pulmonologists can't always distinguish ace effects from copd/asthma.  By themselves they don't actually cause a problem, much like oxygen can't by itself start a fire, but they certainly serve as a powerful catalyst or enhancer for any "fire"  or inflammatory process in the upper airway, be it caused by an ET  tube or more commonly reflux (especially in the obese or pts with known GERD or who are on biphoshonates) or active sinusitis with pnds   In the era of ARB near equivalency until we have a better handle on the reversibility of the airway problem, it just makes sense to avoid ACEI  entirely in the short run and then decide later, having established a level of airway control using a reasonable limited regimen, whether to add back ace but even then being very careful to observe the pt for worsening airway control and number of meds used/ needed to control symptoms.    For now try off lisinopril and rx diovan 80 mg daily

## 2012-02-17 NOTE — Assessment & Plan Note (Addendum)
When respiratory symptoms begin or become refractory well after a patient reports complete smoking cessation,  Especially when this wasn't the case while they were smoking, a red flag is raised based on the work of Dr Primitivo Gauze which states:  if you quit smoking when your best day FEV1 is still well preserved it is highly unlikely you will progress to severe disease.  That is to say, once the smoking stops,  the symptoms should not suddenly erupt or markedly worsen.  If so, the differential diagnosis should include  obesity/deconditioning,  LPR/Reflux/Aspiration/sinusitis syndromes,  occult CHF, or  especially side effect of medications commonly used in this population like acei  For now try off acei and teach mdi and rx gerd with diet ( no fish oil) then regroup with pfts The proper method of use, as well as anticipated side effects, of a metered-dose inhaler are discussed and demonstrated to the patient.   Improved effectiveness after extensive coaching during this visit to a level of approximately  75% > rx dulera 100 2 bid until returns for pfts  He should be good for sinus surgery/ in fact his airway control should be much better once this problem is addressed

## 2012-02-19 ENCOUNTER — Telehealth: Payer: Self-pay | Admitting: Internal Medicine

## 2012-02-19 NOTE — Telephone Encounter (Signed)
Received 11 pages from [facility], sent to Dr. Sherene Sires. 02/19/12/sd

## 2012-02-21 ENCOUNTER — Other Ambulatory Visit: Payer: Self-pay | Admitting: Otolaryngology

## 2012-02-21 ENCOUNTER — Institutional Professional Consult (permissible substitution): Payer: Medicare Other | Admitting: Internal Medicine

## 2012-03-26 ENCOUNTER — Ambulatory Visit (INDEPENDENT_AMBULATORY_CARE_PROVIDER_SITE_OTHER): Payer: Medicare Other | Admitting: Internal Medicine

## 2012-03-26 ENCOUNTER — Encounter: Payer: Self-pay | Admitting: Internal Medicine

## 2012-03-26 VITALS — BP 110/62 | HR 76 | Temp 97.8°F | Ht 71.0 in | Wt 231.0 lb

## 2012-03-26 DIAGNOSIS — J449 Chronic obstructive pulmonary disease, unspecified: Secondary | ICD-10-CM

## 2012-03-26 DIAGNOSIS — I10 Essential (primary) hypertension: Secondary | ICD-10-CM

## 2012-03-26 DIAGNOSIS — R05 Cough: Secondary | ICD-10-CM

## 2012-03-26 LAB — PULMONARY FUNCTION TEST

## 2012-03-26 MED ORDER — LOSARTAN POTASSIUM 50 MG PO TABS: 50.0000 mg | ORAL_TABLET | Freq: Every day | ORAL | Status: DC

## 2012-03-26 MED ORDER — ACLIDINIUM BROMIDE 400 MCG/ACT IN AEPB: 1.0000 | INHALATION_SPRAY | Freq: Two times a day (BID) | RESPIRATORY_TRACT | Status: DC

## 2012-03-26 NOTE — Assessment & Plan Note (Signed)
Trial off acei starte 02/17/2012 due to cough > improved 03/26/2012   Wants generic > cozaar 50 mg daily called in for a year's supply but further refills should be thru Dr Tenny Craw

## 2012-03-26 NOTE — Progress Notes (Signed)
  Subjective:    Patient ID: [name], male    DOB: [birth year]  MRN: [id number]  HPI  44 yowm quit smoking around 2003  With "smoker cough"  And some coughing assoc with  seasonal rhinitis  Which both improved  but since around 2010 worse again with daily cough dx as sinus dz and planning sinus surgery 10/23 and sent by [facility] for pre-op pulmonary eval 02/16/2012   02/16/2012 1st pulmonary eval cc daily cough min prod mucus x sev years with rattling quality  and no sign sob, cough is better while sleeping does not flare first thing in am  Able to walk a mile a day at 2.7 -2.8 at grade 2 -3 s difficulty.  rec Stop fish oil and lisinopril dulera 100 Take 2 puffs first thing in am and then another 2 puffs about 12 hours later.  For cough mucinex dm 1-2 every 12 hours as needed  GERD diet  03/26/2012 f/u ov/Sommer Spickard cc variable cough mostly during the day, rattles but no production, happens sev times at most typically p eating. Min sob, overall much better since sinus surgery and off acei  Sleeping ok without nocturnal  or early am exacerbation  of respiratory  c/o's or need for noct saba. Also denies any obvious fluctuation of symptoms with weather or environmental changes or other aggravating or alleviating factors except as outlined above   ROS  The following are not active complaints unless bolded sore throat, dysphagia, dental problems, itching, sneezing,  nasal congestion or excess/ purulent secretions, ear ache,   fever, chills, sweats, unintended wt loss, pleuritic or exertional cp, hemoptysis,  orthopnea pnd or leg swelling, presyncope, palpitations, heartburn, abdominal pain, anorexia, nausea, vomiting, diarrhea  or change in bowel or urinary habits, change in stools or urine, dysuria,hematuria,  rash, arthralgias, visual complaints, headache, numbness weakness or ataxia or problems with walking or coordination,  change in mood/affect or memory.             Objective:   Physical  Exam amb wm no longer with gruff voice Wt 03/26/2012  Wt Readings from Last 3 Encounters:  02/16/12 229 lb (103.874 kg)  05/19/11 225 lb (102.059 kg)  05/19/11 225 lb (102.059 kg)    HEENT mild turbinate edema.  Oropharynx no thrush or excess pnd or cobblestoning.  No JVD or cervical adenopathy. Mild accessory muscle hypertrophy. Trachea midline, nl thryroid. Chest was hyperinflated by percussion with diminished breath sounds and moderate increased exp time with very coarse sonorous bilateral  Wheeze both prox and distal. Hoover sign positive at mid inspiration. Regular rate and rhythm without murmur gallop or rub or increase P2 or edema.  Abd: no hsm, nl excursion. Ext warm without cyanosis or clubbing.    cxr 02/08/12 Copd s acute changes    Assessment & Plan:

## 2012-03-26 NOTE — Assessment & Plan Note (Signed)
-   PFTs 03/26/2012 FEV1  1.36 (45%) Ratio 55 and no better p B2, DLCO 65% - DPI 100% effective 03/26/2012   GOLD III but with minimal limiting sob 10 y after quit. Main problem is sense of congestion / chest rattling some better off acei, no better on dulera so worth trying LAMA in form of Tudorza bid.  The proper method of use, as well as anticipated side effects, of a metered-dose (DPI) inhaler are discussed and demonstrated to the patient. Improved effectiveness after extensive coaching during this visit to a level of approximately  100%  The cough may also be gerd as worse p supper >  Next step is trial of ppi and hsh2.

## 2012-03-26 NOTE — Progress Notes (Signed)
PFT done today. 

## 2012-03-26 NOTE — Patient Instructions (Signed)
Stop the diovan when you get your cozaar (losartan) 50 mg one daily   Try turdorza one twice daily x 2 weeks to see if it helps your breathing or coughing and if not stop it, if you like it refill it  If continue to cough Try prilosec 20mg   Take 30-60 min before first meal of the day and Pepcid 20 mg one bedtime until cough is completely gone (these are also available as generics we can order you through [facility] if you find them effective)  GERD (REFLUX)  is an extremely common cause of respiratory symptoms, many times with no significant heartburn at all.    It can be treated with medication, but also with lifestyle changes including avoidance of late meals, excessive alcohol, smoking cessation, and avoid fatty foods, chocolate, peppermint, colas, red wine, and acidic juices such as orange juice.  NO MINT OR MENTHOL PRODUCTS SO NO COUGH DROPS  USE SUGARLESS CANDY INSTEAD (jolley ranchers or Stover's)  NO OIL BASED VITAMINS - use powdered substitutes.    If you have bothersome cough or breathing difficulties please return here asap, but if you're happy with medications have your doctor refill them, if not tell me!

## 2012-06-26 ENCOUNTER — Ambulatory Visit (INDEPENDENT_AMBULATORY_CARE_PROVIDER_SITE_OTHER): Payer: Medicare Other | Admitting: Internal Medicine

## 2012-06-26 ENCOUNTER — Encounter: Payer: Self-pay | Admitting: Internal Medicine

## 2012-06-26 VITALS — BP 130/66 | HR 85 | Temp 97.7°F | Ht 71.0 in | Wt 235.0 lb

## 2012-06-26 DIAGNOSIS — I10 Essential (primary) hypertension: Secondary | ICD-10-CM

## 2012-06-26 DIAGNOSIS — R05 Cough: Secondary | ICD-10-CM

## 2012-06-26 DIAGNOSIS — J449 Chronic obstructive pulmonary disease, unspecified: Secondary | ICD-10-CM

## 2012-06-26 MED ORDER — PREDNISONE (PAK) 10 MG PO TABS: ORAL_TABLET | ORAL | Status: DC

## 2012-06-26 MED ORDER — AZELASTINE-FLUTICASONE 137-50 MCG/ACT NA SUSP: 1.0000 | Freq: Two times a day (BID) | NASAL | Status: DC

## 2012-06-26 MED ORDER — MOMETASONE FURO-FORMOTEROL FUM 100-5 MCG/ACT IN AERO: INHALATION_SPRAY | RESPIRATORY_TRACT | Status: DC

## 2012-06-26 NOTE — Progress Notes (Signed)
Subjective:    Patient ID: [name], male    DOB: [birth year]  MRN: [id number]  HPI  70 yowm quit smoking around 2003  With "smoker cough"  And some coughing assoc with  seasonal rhinitis spring and fall Which both improved  but since around 2010 worse again with daily cough dx as sinus dz and planning sinus surgery 10/23 and sent by [facility] for pre-op pulmonary eval 02/16/2012.    02/16/2012 1st pulmonary eval cc daily cough min prod mucus x sev years with rattling quality  and no sign sob, cough is better while sleeping does not flare first thing in am  Able to walk a mile a day at 2.7 -2.8 at grade 2 -3 s difficulty.  rec Stop fish oil and lisinopril dulera 100 Take 2 puffs first thing in am and then another 2 puffs about 12 hours later.  For cough mucinex dm 1-2 every 12 hours as needed  GERD diet  03/26/2012 f/u ov/[name] cc variable cough mostly during the day, rattles but no production, happens sev times at most typically p eating. Min sob, overall much better since sinus surgery and off acei Stop the diovan when you get your cozaar (losartan) 50 mg one daily  Try turdorza one twice daily x 2 weeks to see if it helps your breathing or coughing and if not stop it, if you like it refill it If continue to cough Try prilosec 20mg   Take 30-60 min before first meal of the day and Pepcid 20 mg one bedtime until cough is completely gone   GERD (REFLUX)   06/26/2012 f/u ov/[name] cc daily cough x 4 years worse p bfast and assoc with sense of pnds no better on flonase, better on allegra d but stopped working x one year or so  No obvious daytime variabilty or assoc sob or cp or chest tightness, subjective wheeze overt sinus or hb symptoms. No unusual exp hx or h/o childhood pna/ asthma or premature birth to his knowledge.      Sleeping ok without nocturnal  or early am exacerbation  of respiratory  c/o's or need for noct saba. Also denies any obvious fluctuation of symptoms with weather or  environmental changes or other aggravating or alleviating factors except as outlined above   ROS  The following are not active complaints unless bolded sore throat, dysphagia, dental problems, itching, sneezing,  nasal congestion or excess/ purulent secretions, ear ache,   fever, chills, sweats, unintended wt loss, pleuritic or exertional cp, hemoptysis,  orthopnea pnd or leg swelling, presyncope, palpitations, heartburn, abdominal pain, anorexia, nausea, vomiting, diarrhea  or change in bowel or urinary habits, change in stools or urine, dysuria,hematuria,  rash, arthralgias, visual complaints, headache, numbness weakness or ataxia or problems with walking or coordination,  change in mood/affect or memory.             Objective:   Physical Exam  amb wm with gruff voice and congested  cough Wt  235 06/26/2012  Wt Readings from Last 3 Encounters:  02/16/12 229 lb (103.874 kg)  05/19/11 225 lb (102.059 kg)  05/19/11 225 lb (102.059 kg)    HEENT mild turbinate edema.  Oropharynx no thrush or excess pnd or cobblestoning.  No JVD or cervical adenopathy. Mild accessory muscle hypertrophy. Trachea midline, nl thryroid. Chest was hyperinflated by percussion with diminished breath sounds and moderate increased exp time with very coarse sonorous bilateral  Wheeze both prox and distal. Hoover sign positive at mid  inspiration. Regular rate and rhythm without murmur gallop or rub or increase P2 or edema.  Abd: no hsm, nl excursion. Ext warm without cyanosis or clubbing.    cxr 02/08/12 Copd s acute changes    Assessment & Plan:

## 2012-06-26 NOTE — Patient Instructions (Addendum)
Try prilosec 20mg   Take 30-60 min before first meal of the day and Pepcid 20 mg one bedtime   Dulera 100 Take 2 puffs first thing in am and then another 2 puffs about 12 hours later.   Dymista twice daily each nostil  If still draining try adding chlortrimeton 4 mg every 6 hours (may cause drowsiness)  Prednisone 10 mg take  4 each am x 2 days,   2 each am x 2 days,  1 each am x2days and stop   Please schedule a follow up office visit in 2 weeks, sooner if needed  Late add: sinus ct next

## 2012-06-30 NOTE — Assessment & Plan Note (Addendum)
Adequate control on present rx, reviewed  

## 2012-06-30 NOTE — Assessment & Plan Note (Addendum)
-   Trial off ACEI started 02/17/2012   Still feel the dx here is  Classic Upper airway cough syndrome, so named because it's frequently impossible to sort out how much is  CR/sinusitis with freq throat clearing (which can be related to primary GERD)   vs  causing  secondary (" extra esophageal")  GERD from wide swings in gastric pressure that occur with throat clearing, often  promoting self use of mint and menthol lozenges that reduce the lower esophageal sphincter tone and exacerbate the problem further in a cyclical fashion.   These are the same pts (now being labeled as having "irritable larynx syndrome" by some cough centers) who not infrequently have a history of having failed to tolerate ace inhibitors,  dry powder inhalers or biphosphonates or report having atypical reflux symptoms that don't respond to standard doses of PPI , and are easily confused as having aecopd or asthma flares by even experienced allergists/ pulmonologists.   For now rx chronic rhinitis with trial of dymista and max gerd rx then regroup with sinus ct next   I had an extended discussion with the patient today lasting 15 to 20 minutes of a 25 minute visit on the following issues:   Unlike when you get a prescription for eyeglasses, it's not possible to always walk out of this or any medical office with a perfect prescription that is immediately effective  based on any test that we offer here.    On the contrary, it may take several weeks for the full impact of changes recommened today - hopefully you will respond well.  If not, then we'll adjust your medication on your next visit accordingly, knowing more then than we can possibly know now.

## 2012-06-30 NOTE — Assessment & Plan Note (Signed)
-   PFTs 03/26/2012 FEV1  1.36 (45%) Ratio 55 and no better p B2, DLCO 65% - DPI 100% effective 03/26/2012   Doubt the cough is copd related so  Best option for rx is avoid high dose ics or dpi which can aggravate the cough and instead for now with just dulera 100 2bid

## 2012-07-10 ENCOUNTER — Encounter: Payer: Self-pay | Admitting: Internal Medicine

## 2012-07-10 ENCOUNTER — Ambulatory Visit (INDEPENDENT_AMBULATORY_CARE_PROVIDER_SITE_OTHER): Payer: Medicare Other | Admitting: Internal Medicine

## 2012-07-10 VITALS — BP 110/60 | HR 80 | Temp 97.3°F | Ht 71.0 in | Wt 237.0 lb

## 2012-07-10 DIAGNOSIS — R05 Cough: Secondary | ICD-10-CM

## 2012-07-10 DIAGNOSIS — J449 Chronic obstructive pulmonary disease, unspecified: Secondary | ICD-10-CM

## 2012-07-10 MED ORDER — AZELASTINE-FLUTICASONE 137-50 MCG/ACT NA SUSP: 1.0000 | Freq: Two times a day (BID) | NASAL | Status: DC

## 2012-07-10 NOTE — Patient Instructions (Addendum)
Try off dulera to see if any symptoms flare but no other changes for the next month  Please schedule a follow up office visit in 4 weeks, sooner if needed

## 2012-07-10 NOTE — Progress Notes (Signed)
Subjective:    Patient ID: [name], male    DOB: 1940/12/01  MRN: [id number]  HPI  33 yowm quit smoking around 2003  With "smoker cough"  And some coughing assoc with  seasonal rhinitis spring and fall Which both improved  but since around 2010 worse again with daily cough dx as sinus dz and planning sinus surgery 10/23 and sent by [facility] for pre-op pulmonary eval 02/16/2012.    02/16/2012 1st pulmonary eval cc daily cough min prod mucus x sev years with rattling quality  and no sign sob, cough is better while sleeping does not flare first thing in am  Able to walk a mile a day at 2.7 -2.8 at grade 2 -3 s difficulty.  rec Stop fish oil and lisinopril dulera 100 Take 2 puffs first thing in am and then another 2 puffs about 12 hours later.  For cough mucinex dm 1-2 every 12 hours as needed  GERD diet  03/26/2012 f/u ov/Wert cc variable cough mostly during the day, rattles but no production, happens sev times at most typically p eating. Min sob, overall much better since sinus surgery and off acei Stop the diovan when you get your cozaar (losartan) 50 mg one daily  Try turdorza one twice daily x 2 weeks to see if it helps your breathing or coughing and if not stop it, if you like it refill it If continue to cough Try prilosec 20mg   Take 30-60 min before first meal of the day and Pepcid 20 mg one bedtime until cough is completely gone   GERD (REFLUX)   06/26/2012 f/u ov/Wert cc daily cough x 4 years worse p bfast and assoc with sense of pnds no better on flonase, better on allegra d but stopped working x one year or so rec Try prilosec 20mg   Take 30-60 min before first meal of the day and Pepcid 20 mg one bedtime  Dulera 100 Take 2 puffs first thing in am and then another 2 puffs about 12 hours later.  Dymista twice daily each nostil If still draining try adding chlortrimeton 4 mg every 6 hours (may cause drowsiness) Prednisone 10 mg take  4 each am x 2 days,   2 each am x 2 days,  1  each am x2days and stop         07/10/2012 ov / Pulmonary f/u 100% better on above regimen, no sob, no cough    No obvious daytime variabilty or assoc sob or cp or chest tightness, subjective wheeze overt sinus or hb symptoms. No unusual exp hx or h/o childhood pna/ asthma or premature birth to his knowledge.      Sleeping ok without nocturnal  or early am exacerbation  of respiratory  c/o's or need for noct saba. Also denies any obvious fluctuation of symptoms with weather or environmental changes or other aggravating or alleviating factors except as outlined above   ROS  The following are not active complaints unless bolded sore throat, dysphagia, dental problems, itching, sneezing,  nasal congestion or excess/ purulent secretions, ear ache,   fever, chills, sweats, unintended wt loss, pleuritic or exertional cp, hemoptysis,  orthopnea pnd or leg swelling, presyncope, palpitations, heartburn, abdominal pain, anorexia, nausea, vomiting, diarrhea  or change in bowel or urinary habits, change in stools or urine, dysuria,hematuria,  rash, arthralgias, visual complaints, headache, numbness weakness or ataxia or problems with walking or coordination,  change in mood/affect or memory.  Objective:   Physical Exam  amb wm  nad  Wt  235 06/26/2012 > 07/10/2012  237  Wt Readings from Last 3 Encounters:  02/16/12 229 lb (103.874 kg)  05/19/11 225 lb (102.059 kg)  05/19/11 225 lb (102.059 kg)    HEENT mild turbinate edema.  Oropharynx no thrush or excess pnd or cobblestoning.  No JVD or cervical adenopathy. Mild accessory muscle hypertrophy. Trachea midline, nl thryroid. Chest was hyperinflated by percussion with diminished breath sounds and moderate increased exp time with very coarse sonorous bilateral  Wheeze both prox and distal. Hoover sign positive at mid inspiration. Regular rate and rhythm without murmur gallop or rub or increase P2 or edema.  Abd: no hsm, nl excursion. Ext  warm without cyanosis or clubbing.    cxr 02/08/12 Copd s acute changes    Assessment & Plan:

## 2012-07-11 NOTE — Assessment & Plan Note (Signed)
He clearly has a component of  Classic Upper airway cough syndrome, so named because it's frequently impossible to sort out how much is  CR/sinusitis with freq throat clearing (which can be related to primary GERD)   vs  causing  secondary (" extra esophageal")  GERD from wide swings in gastric pressure that occur with throat clearing, often  promoting self use of mint and menthol lozenges that reduce the lower esophageal sphincter tone and exacerbate the problem further in a cyclical fashion.   These are the same pts (now being labeled as having "irritable larynx syndrome" by some cough centers) who not infrequently have a history of having failed to tolerate ace inhibitors,  dry powder inhalers  (as may well have been the case here) or biphosphonates or report having atypical reflux symptoms that don't respond to standard doses of PPI , and are easily confused as having aecopd or asthma flares by even experienced allergists/ pulmonologists.

## 2012-07-11 NOTE — Assessment & Plan Note (Signed)
As I explained to this patient in detail:  although there may be significant copd present, it does not appear to be limiting activity tolerance any more than a set of worn tires limits someone from driving a car  around a parking lot.  A new set of Michelins might look good but would have no perceived impact on the performance of the car and would not be worth the cost.   his main symptom was cough which was eliminated on present rx and probably doesn't need dulera in low doses so will try off using a reverse therapeutic trial knowing that if he needs to be back on a maint inhaler dulera (or symbicort) will work w/in 15 minutes to address this component of the problem  In meantime cough control discussed separately

## 2012-08-08 ENCOUNTER — Ambulatory Visit: Payer: Medicare Other | Admitting: Internal Medicine

## 2013-04-09 ENCOUNTER — Encounter: Payer: Self-pay | Admitting: Podiatry

## 2013-04-09 ENCOUNTER — Ambulatory Visit (INDEPENDENT_AMBULATORY_CARE_PROVIDER_SITE_OTHER): Payer: Medicare Other | Admitting: Podiatry

## 2013-04-09 VITALS — BP 133/68 | HR 98 | Resp 12

## 2013-04-09 DIAGNOSIS — M21619 Bunion of unspecified foot: Secondary | ICD-10-CM

## 2013-04-09 DIAGNOSIS — M2010 Hallux valgus (acquired), unspecified foot: Secondary | ICD-10-CM

## 2013-04-09 DIAGNOSIS — Q828 Other specified congenital malformations of skin: Secondary | ICD-10-CM

## 2013-04-09 NOTE — Progress Notes (Signed)
   Subjective:    Patient ID: [name], male    DOB: Apr 02, 1941, 72 y.o.   MRN: [id number]  HPI Comments: '' CALLUSES ON BOTH FEET AND THEY HURTING, ESPECIALLY WHEN WALKING''.  Patient wears custom orthotics to offload the fifth MPJ on a daily basis. Orthotics were refurbished July 2014.   Review of Systems     Objective:   Physical Exam  Vascular: The DP and PT pulses are two over four bilaterally. Feet are warm to touch bilaterally. Capillary fill is immediate bilaterally.  Neurological: Sensation  To 10 gm monofilament wire intact 9/10 locations bilaterally. Vibratory sensation intact bilaterally. Knee and ankle reflexes weakly reactive bilaterally.  Dermatological: Nucleated keratoses fifth right MPJ, diffuse keratoses fifth left MPJ. Mild atrophic skin noted bilaterally.  Musculoskeletal: Bilateral Taylor's bunions. HAV right.        Assessment & Plan:   Assessment: Satisfactory neurovascular status bilaterally. 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 Remove bandage in 24-hour. Return as needed for debridement. Continue wear custom foot orthotics.

## 2013-04-09 NOTE — Patient Instructions (Signed)
Wear custom orthotics on a daily basis. Return as needed for debridement of the painful callus on the bottom of the right foot.

## 2014-10-06 ENCOUNTER — Other Ambulatory Visit: Payer: Self-pay | Admitting: Family Medicine

## 2014-10-06 DIAGNOSIS — M5431 Sciatica, right side: Secondary | ICD-10-CM

## 2014-10-17 ENCOUNTER — Ambulatory Visit
Admission: RE | Admit: 2014-10-17 | Discharge: 2014-10-17 | Disposition: A | Discharge status: Home / Self Care | Payer: Medicare HMO | Source: Ambulatory Visit | Attending: Family Medicine | Admitting: Family Medicine

## 2014-10-17 DIAGNOSIS — M5431 Sciatica, right side: Secondary | ICD-10-CM

## 2014-12-02 ENCOUNTER — Other Ambulatory Visit: Payer: Self-pay | Admitting: Neurosurgery

## 2014-12-02 DIAGNOSIS — M48061 Spinal stenosis, lumbar region without neurogenic claudication: Secondary | ICD-10-CM

## 2014-12-03 ENCOUNTER — Ambulatory Visit
Admission: RE | Admit: 2014-12-03 | Discharge: 2014-12-03 | Disposition: A | Discharge status: Home / Self Care | Payer: Medicare HMO | Source: Ambulatory Visit | Attending: Neurosurgery | Admitting: Neurosurgery

## 2014-12-03 VITALS — BP 135/70 | HR 80

## 2014-12-03 DIAGNOSIS — M48061 Spinal stenosis, lumbar region without neurogenic claudication: Secondary | ICD-10-CM

## 2014-12-03 DIAGNOSIS — M519 Unspecified thoracic, thoracolumbar and lumbosacral intervertebral disc disorder: Secondary | ICD-10-CM

## 2014-12-03 MED ORDER — IOHEXOL 180 MG/ML  SOLN
1.0000 mL | Freq: Once | INTRAMUSCULAR | Status: AC | PRN
  Administered 2014-12-03: 1 mL via EPIDURAL

## 2014-12-03 MED ORDER — METHYLPREDNISOLONE ACETATE 40 MG/ML INJ SUSP (RADIOLOG
120.0000 mg | Freq: Once | INTRAMUSCULAR | Status: AC
  Administered 2014-12-03: 120 mg via EPIDURAL

## 2014-12-03 NOTE — Discharge Instructions (Signed)

## 2016-02-02 ENCOUNTER — Encounter: Payer: Self-pay | Admitting: Internal Medicine

## 2016-02-02 ENCOUNTER — Ambulatory Visit (INDEPENDENT_AMBULATORY_CARE_PROVIDER_SITE_OTHER): Payer: Medicare HMO | Admitting: Internal Medicine

## 2016-02-02 VITALS — BP 136/60 | HR 110 | Ht 70.5 in | Wt 201.8 lb

## 2016-02-02 DIAGNOSIS — J449 Chronic obstructive pulmonary disease, unspecified: Secondary | ICD-10-CM

## 2016-02-02 MED ORDER — AMOXICILLIN-POT CLAVULANATE 875-125 MG PO TABS: 1.0000 | ORAL_TABLET | Freq: Two times a day (BID) | ORAL | 0 refills | Status: AC

## 2016-02-02 MED ORDER — FLUTTER DEVI: 0 refills | Status: DC

## 2016-02-02 MED ORDER — PANTOPRAZOLE SODIUM 40 MG PO TBEC: 40.0000 mg | DELAYED_RELEASE_TABLET | Freq: Every day | ORAL | 2 refills | Status: DC

## 2016-02-02 MED ORDER — FAMOTIDINE 20 MG PO TABS: ORAL_TABLET | ORAL | Status: DC

## 2016-02-02 MED ORDER — BUDESONIDE-FORMOTEROL FUMARATE 80-4.5 MCG/ACT IN AERO: 2.0000 | INHALATION_SPRAY | Freq: Two times a day (BID) | RESPIRATORY_TRACT | 0 refills | Status: DC

## 2016-02-02 MED ORDER — PREDNISONE 10 MG PO TABS: ORAL_TABLET | ORAL | 0 refills | Status: DC

## 2016-02-02 NOTE — Patient Instructions (Addendum)
For cough > mucinex dm up to 1200 mg every hours and use flutter valve   symbicort 80 Take 2 puffs first thing in am and then another 2 puffs about 12 hours later.     Pantoprazole (protonix) 40 mg   Take  30-60 min before first meal of the day and Pepcid (famotidine)  20 mg one @  bedtime until return to office - this is the best way to tell whether stomach acid is contributing to your problem.    GERD (REFLUX)  is an extremely common cause of respiratory symptoms just like yours , many times with no obvious heartburn at all.    It can be treated with medication, but also with lifestyle changes including elevation of the head of your bed (ideally with 6 inch  bed blocks),  Smoking cessation, avoidance of late meals, excessive alcohol, and avoid fatty foods, chocolate, peppermint, colas, red wine, and acidic juices such as orange juice.  NO MINT OR MENTHOL PRODUCTS SO NO COUGH DROPS   USE SUGARLESS CANDY INSTEAD (Jolley ranchers or Stover's or Life Savers) or even ice chips will also do - the key is to swallow to prevent all throat clearing. NO OIL BASED VITAMINS - use powdered substitutes.   Prednisone 10 mg take  4 each am x 2 days,   2 each am x 2 days,  1 each am x 2 days and stop   Augmentin 875 mg take one pill twice daily  X 10 days - take at breakfast and supper with large glass of water.  It would help reduce the usual side effects (diarrhea and yeast infections) if you ate cultured yogurt at lunch.   Please schedule a follow up office visit in 2 weeks, sooner if needed Late add: ? Needs allergy profile/ bnp on f/u

## 2016-02-02 NOTE — Progress Notes (Signed)
Subjective:    Patient ID: [name], male    DOB: [birth year]  MRN: [id number]    Brief patient profile:  63 yowm quit smoking around 2003  With "smoker's cough"  And some coughing assoc with  seasonal rhinitis spring and fall Which both improved  but since around 2010 worse again with daily cough dx as sinus dz and planning sinus surgery 10/23 and sent by [facility]NT for pre-op pulmonary eval 02/16/2012.      History of Present Illness  02/16/2012 1st pulmonary eval cc daily cough min prod mucus x sev years with rattling quality  and no sign sob, cough is better while sleeping does not flare first thing in am  Able to walk a mile a day at 2.7 -2.8 at grade 2 -3 s difficulty.  rec Stop fish oil and lisinopril dulera 100 Take 2 puffs first thing in am and then another 2 puffs about 12 hours later.  For cough mucinex dm 1-2 every 12 hours as needed  GERD diet   03/26/2012 f/u ov/[name] cc variable cough mostly during the day, rattles but no production, happens sev times at most typically p eating. Min sob, overall much better since sinus surgery and off acei Stop the diovan when you get your cozaar (losartan) 50 mg one daily  Try turdorza one twice daily x 2 weeks to see if it helps your breathing or coughing and if not stop it, if you like it refill it If continue to cough Try prilosec 20mg   Take 30-60 min before first meal of the day and Pepcid 20 mg one bedtime until cough is completely gone   GERD diet    06/26/2012 f/u ov/[name] cc daily cough x 4 years worse p bfast and assoc with sense of pnds no better on flonase, better on allegra d but stopped working x one year or so rec Try prilosec 20mg   Take 30-60 min before first meal of the day and Pepcid 20 mg one bedtime  Dulera 100 Take 2 puffs first thing in am and then another 2 puffs about 12 hours later.  Dymista twice daily each nostil If still draining try adding chlortrimeton 4 mg every 6 hours (may cause drowsiness) Prednisone 10  mg take  4 each am x 2 days,   2 each am x 2 days,  1 each am x2days and stop         07/10/2012 ov / Pulmonary f/u 100% better on above regimen, no sob, no cough rec Try off dulera to see if any symptoms flare but no other changes for the next month Please schedule a follow up office visit in 4 weeks, sooner if needed > did not return      02/02/2016  Pulmonary consultation/ new sob  Chief Complaint  Patient presents with  . Pulmonary Consult    Referred by Dr Kathryne Eriksson. Pt seen here last in 2014. He c/o increased SOB for the past month. He states he can not walk longer than 5 min on level ground due to SOB. He also c/o "deep cough", hard to produce sputum and he is unsure of color.   Baseline coughing  Since last ov / no prednisone/ no inhalers no gerd rx only flonase  gradually worse cough x 3y then breathing worse x one month Cough is deeper / more congested but not productive still mostly daytime and worse p meals  Was walking sev miles a day until one month prior to OV  Now sob room to room   Already on bevespi and no better after zpak / prednisone   No obvious day to day or daytime variability or assoc excess/ purulent sputum or mucus plugs or hemoptysis or cp or chest tightness, subjective wheeze or overt sinus or hb symptoms. No unusual exp hx or h/o childhood pna/ asthma or knowledge of premature birth.  Sleeping ok without nocturnal  or early am exacerbation  of respiratory  c/o's or need for noct saba. Also denies any obvious fluctuation of symptoms with weather or environmental changes or other aggravating or alleviating factors except as outlined above   Current Medications, Allergies, Complete Past Medical History, Past Surgical History, Family History, and Social History were reviewed in [employer] record.  ROS  The following are not active complaints unless bolded sore throat, dysphagia, dental problems, itching, sneezing,  nasal congestion or  excess/ purulent secretions, ear ache,   fever, chills, sweats, unintended wt loss, classically pleuritic or exertional cp,  orthopnea pnd or leg swelling, presyncope, palpitations, abdominal pain, anorexia, nausea, vomiting, diarrhea  or change in bowel or bladder habits, change in stools or urine, dysuria,hematuria,  rash, arthralgias, visual complaints, headache, numbness, weakness or ataxia or problems with walking or coordination,  change in mood/affect or memory.              Objective:   Physical Exam  amb wm  nad / vital signs reviewed - sasts 95% on RA on Arrival   Wt  235 06/26/2012 > 07/10/2012  237 > 02/02/2016 202    02/16/12 229 lb (103.874 kg)  05/19/11 225 lb (102.059 kg)  05/19/11 225 lb (102.059 kg)    HEENT mild turbinate edema.  Top dentures/ Oropharynx no thrush or excess pnd or cobblestoning.  No JVD or cervical adenopathy. Mild accessory muscle hypertrophy. Trachea midline, nl thryroid. Chest was hyperinflated by percussion with diminished breath sounds and moderate increased exp time with very coarse sonorous bilateral  Rhonchi  both prox and distal. Hoover sign positive at mid inspiration. Regular rate and rhythm without murmur gallop or rub or increase P2 or edema.  Abd: no hsm, nl excursion. Ext warm without cyanosis or clubbing.       CXR 01/28/16  Bilateral int changes, small bilateral effusions  ekg NSR/ no ischemic or hypertrophic changes 01/28/16      Assessment & Plan:

## 2016-02-02 NOTE — Assessment & Plan Note (Signed)
-   PFTs 03/26/2012 FEV1  1.36 (45%) Ratio 55 and no better p B2, DLCO 65% - Spirometry 01/28/16   FEV1 0.82 (27%)  Ratio 60% . 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 DDX of  difficult airways management almost all start with A and  include Adherence, Ace Inhibitors, Acid Reflux, Active Sinus Disease, Alpha 1 Antitripsin deficiency, Anxiety masquerading as Airways dz,  ABPA,  Allergy(esp in young), Aspiration (esp in elderly), Adverse effects of meds,  Active smokers, A bunch of PE's (a small clot burden can't cause this syndrome unless there is already severe underlying pulm or vascular dz with poor reserve) plus two Bs  = Bronchiectasis and Beta blocker use..and one C= CHF   Adherence is always the initial "prime suspect" and is a multilayered concern that requires a "trust but verify" approach in every patient - starting with knowing how to use medications, especially inhalers, correctly, keeping up with refills and understanding the fundamental difference between maintenance and prns vs those medications only taken for a very short course and then stopped and not refilled.  - The proper method of use, as well as anticipated side effects, of a metered-dose inhaler are discussed and demonstrated to the patient. Improved effectiveness after extensive coaching during this visit to a level of approximately 75 % from a baseline of 50 % > try symb 80 2bid since cough is so severe  ? Acid (or non-acid) GERD > always difficult to exclude as up to 75% of pts in some series report no assoc GI/ Heartburn symptoms> rec max (24h)  acid suppression and diet restrictions/ reviewed and instructions given in writing.   ? Allergy >  Prednisone 10 mg take  4 each am x 2 days,   2 each am x 2 days,  1 each am x 2 days and stop  - consider allergy profile on return   ?Active sinus dz >  augmentin x 10 days then CT sinus if not better - note the last time he had refractory symptoms was due to sinusitis/acei  ? ACEi effects > note For reasons that may related to vascular permability and nitric oxide pathways but not elevated  bradykinin levels (as seen with  ACEi use) losartan in the generic form has been reported now from mulitple sources  to cause a similar pattern of non-specific  upper airway symptoms as seen with acei.   This has not been reported with exposure to the other ARB's to date, so may need to consider either generic diovan or avapro if ARB needed or use an alternative class altogether.  See:  Lelon Frohlich Allergy Asthma Immunol  2008: 101: p [phone]    ? chf > check bnp on return if not better     Total time devoted to counseling  = 35/42m review case with pt/wife  discussion of options/alternatives/ personally creating written instructions  in presence of pt  then going over those specific  Instructions directly with the pt including how to use all of the meds but in particular covering each new medication in detail and the difference between the maintenance/automatic meds and the prns using an action plan format for the latter.

## 2016-02-17 ENCOUNTER — Other Ambulatory Visit (INDEPENDENT_AMBULATORY_CARE_PROVIDER_SITE_OTHER): Payer: Medicare HMO

## 2016-02-17 ENCOUNTER — Ambulatory Visit (INDEPENDENT_AMBULATORY_CARE_PROVIDER_SITE_OTHER): Payer: Medicare HMO | Admitting: Internal Medicine

## 2016-02-17 ENCOUNTER — Ambulatory Visit (INDEPENDENT_AMBULATORY_CARE_PROVIDER_SITE_OTHER)
Admission: RE | Admit: 2016-02-17 | Discharge: 2016-02-17 | Disposition: A | Discharge status: Home / Self Care | Payer: Medicare HMO | Source: Ambulatory Visit | Attending: Internal Medicine | Admitting: Internal Medicine

## 2016-02-17 ENCOUNTER — Encounter: Payer: Self-pay | Admitting: Internal Medicine

## 2016-02-17 VITALS — BP 132/70 | HR 89 | Ht 70.5 in | Wt 204.2 lb

## 2016-02-17 DIAGNOSIS — R058 Other specified cough: Secondary | ICD-10-CM

## 2016-02-17 DIAGNOSIS — R0602 Shortness of breath: Secondary | ICD-10-CM

## 2016-02-17 DIAGNOSIS — R05 Cough: Secondary | ICD-10-CM | POA: Diagnosis not present

## 2016-02-17 DIAGNOSIS — J449 Chronic obstructive pulmonary disease, unspecified: Secondary | ICD-10-CM

## 2016-02-17 DIAGNOSIS — Z23 Encounter for immunization: Secondary | ICD-10-CM

## 2016-02-17 LAB — TSH: TSH: 3.03 u[IU]/mL (ref 0.35–4.50)

## 2016-02-17 LAB — SEDIMENTATION RATE: SED RATE: 60 mm/h — AB (ref 0–20)

## 2016-02-17 LAB — CBC WITH DIFFERENTIAL/PLATELET
BASOS PCT: 0.6 % (ref 0.0–3.0)
Basophils Absolute: 0.1 10*3/uL (ref 0.0–0.1)
EOS PCT: 3 % (ref 0.0–5.0)
Eosinophils Absolute: 0.3 10*3/uL (ref 0.0–0.7)
HCT: 38.6 % — ABNORMAL LOW (ref 39.0–52.0)
Hemoglobin: 12.8 g/dL — ABNORMAL LOW (ref 13.0–17.0)
LYMPHS ABS: 2 10*3/uL (ref 0.7–4.0)
Lymphocytes Relative: 22.3 % (ref 12.0–46.0)
MCHC: 33.1 g/dL (ref 30.0–36.0)
MCV: 88.2 fl (ref 78.0–100.0)
MONOS PCT: 6.9 % (ref 3.0–12.0)
Monocytes Absolute: 0.6 10*3/uL (ref 0.1–1.0)
NEUTROS ABS: 6.1 10*3/uL (ref 1.4–7.7)
NEUTROS PCT: 67.2 % (ref 43.0–77.0)
PLATELETS: 294 10*3/uL (ref 150.0–400.0)
RBC: 4.38 Mil/uL (ref 4.22–5.81)
RDW: 15.6 % — AB (ref 11.5–15.5)
WBC: 9 10*3/uL (ref 4.0–10.5)

## 2016-02-17 LAB — BASIC METABOLIC PANEL
BUN: 16 mg/dL (ref 6–23)
CALCIUM: 9 mg/dL (ref 8.4–10.5)
CO2: 32 mEq/L (ref 19–32)
CREATININE: 0.72 mg/dL (ref 0.40–1.50)
Chloride: 99 mEq/L (ref 96–112)
GFR: 113.25 mL/min (ref >60.00)
Glucose, Bld: 169 mg/dL — ABNORMAL HIGH (ref 70–99)
Potassium: 4.2 mEq/L (ref 3.5–5.1)
Sodium: 135 mEq/L (ref 135–145)

## 2016-02-17 LAB — BRAIN NATRIURETIC PEPTIDE: Pro B Natriuretic peptide (BNP): 40 pg/mL (ref 0.0–100.0)

## 2016-02-17 MED ORDER — BUDESONIDE-FORMOTEROL FUMARATE 160-4.5 MCG/ACT IN AERO: 2.0000 | INHALATION_SPRAY | Freq: Two times a day (BID) | RESPIRATORY_TRACT | 11 refills | Status: DC

## 2016-02-17 MED ORDER — BUDESONIDE-FORMOTEROL FUMARATE 160-4.5 MCG/ACT IN AERO: 2.0000 | INHALATION_SPRAY | Freq: Two times a day (BID) | RESPIRATORY_TRACT | 6 refills | Status: DC

## 2016-02-17 NOTE — Progress Notes (Signed)
Subjective:    Patient ID: [name], male    DOB: Mar 29, 1941  MRN: [id number]    Brief patient profile:  66 yowm quit smoking around 2003  With "smoker's cough"  And some coughing assoc with  seasonal rhinitis spring and fall Which both improved  but since around 2010 worse again with daily cough dx as sinus dz and planning sinus surgery 10/23 and sent by [facility] ENT for pre-op pulmonary eval 02/16/2012 with GOLD III COPD criteria then     History of Present Illness  02/16/2012 1st pulmonary eval cc daily cough min prod mucus x sev years with rattling quality  and no sign sob, cough is better while sleeping does not flare first thing in am  Able to walk a mile a day at 2.7 -2.8 at grade 2 -3 s difficulty.  rec Stop fish oil and lisinopril dulera 100 Take 2 puffs first thing in am and then another 2 puffs about 12 hours later.  For cough mucinex dm 1-2 every 12 hours as needed  GERD diet   03/26/2012 f/u ov/[name] cc variable cough mostly during the day, rattles but no production, happens sev times at most typically p eating. Min sob, overall much better since sinus surgery and off acei Stop the diovan when you get your cozaar (losartan) 50 mg one daily  Try turdorza one twice daily x 2 weeks to see if it helps your breathing or coughing and if not stop it, if you like it refill it If continue to cough Try prilosec 20mg   Take 30-60 min before first meal of the day and Pepcid 20 mg one bedtime until cough is completely gone   GERD diet    06/26/2012 f/u ov/[name] cc daily cough x 4 years worse p bfast and assoc with sense of pnds no better on flonase, better on allegra d but stopped working x one year or so rec Try prilosec 20mg   Take 30-60 min before first meal of the day and Pepcid 20 mg one bedtime  Dulera 100 Take 2 puffs first thing in am and then another 2 puffs about 12 hours later.  Dymista twice daily each nostil If still draining try adding chlortrimeton 4 mg every 6 hours (may  cause drowsiness) Prednisone 10 mg take  4 each am x 2 days,   2 each am x 2 days,  1 each am x2days and stop         07/10/2012 ov / Pulmonary f/u 100% better on above regimen, no sob, no cough rec Try off dulera to see if any symptoms flare but no other changes for the next month Please schedule a follow up office visit in 4 weeks, sooner if needed > did not return      02/02/2016  Pulmonary consultation/ new sob  Chief Complaint  Patient presents with  . Pulmonary Consult    Referred by Dr [name]. Pt seen here last in 2014. He c/o increased SOB for the past month. He states he can not walk longer than 5 min on level ground due to SOB. He also c/o "deep cough", hard to produce sputum and he is unsure of color.   Baseline coughing  Since last ov / no prednisone/ no inhalers no gerd rx only flonase  gradually worse cough x 3y then breathing worse x one month Cough is deeper / more congested but not productive still mostly daytime and worse p meals  Was walking sev miles a day until  one month prior to OV  Now sob room to room  Already on bevespi and no better after zpak / prednisone  rec For cough > mucinex dm up to 1200 mg every hours and use flutter valve  symbicort 80 Take 2 puffs first thing in am and then another 2 puffs about 12 hours later.  Pantoprazole (protonix) 40 mg   Take  30-60 min before first meal of the day and Pepcid (famotidine)  20 mg one @  bedtime until return to office - this is the best way to tell whether stomach acid is contributing to your problem.   GERD diet  Prednisone 10 mg take  4 each am x 2 days,   2 each am x 2 days,  1 each am x 2 days and stop  Augmentin 875 mg take one pill twice daily  X 10 days - take at breakfast and supper with large glass of water.  It would help reduce the usual side effects (diarrhea and yeast infections) if you ate cultured yogurt at lunch.         02/17/2016  f/u ov/[name] re: GOLD II copd/ symb 80 2bid and no need for  saba  Chief Complaint  Patient presents with  . Follow-up    Breathing has improved some. He denies any new co's. Still coughing some.    back to walking up to a mile and a half  / flutter valve helping with am cough   No obvious day to day or daytime variability or assoc excess/ purulent sputum or mucus plugs or hemoptysis or cp or chest tightness, subjective wheeze or overt sinus or hb symptoms. No unusual exp hx or h/o childhood pna/ asthma or knowledge of premature birth.  Sleeping ok without nocturnal  or early am exacerbation  of respiratory  c/o's or need for noct saba. Also denies any obvious fluctuation of symptoms with weather or environmental changes or other aggravating or alleviating factors except as outlined above   Current Medications, Allergies, Complete Past Medical History, Past Surgical History, Family History, and Social History were reviewed in [employer] record.  ROS  The following are not active complaints unless bolded sore throat, dysphagia, dental problems, itching, sneezing,  nasal congestion or excess/ purulent secretions, ear ache,   fever, chills, sweats, unintended wt loss, classically pleuritic or exertional cp,  orthopnea pnd or leg swelling, presyncope, palpitations, abdominal pain, anorexia, nausea, vomiting, diarrhea  or change in bowel or bladder habits, change in stools or urine, dysuria,hematuria,  rash, arthralgias, visual complaints, headache, numbness, weakness or ataxia or problems with walking or coordination,  change in mood/affect or memory.           Objective:   Physical Exam  amb wm  nad / vital signs reviewed - sats 95% on RA on Arrival   Wt  235 06/26/2012 > 07/10/2012  237 > 02/02/2016 202 > 02/17/2016 204     02/16/12 229 lb (103.874 kg)  05/19/11 225 lb (102.059 kg)  05/19/11 225 lb (102.059 kg)    HEENT mild turbinate edema.  Top dentures/ Oropharynx no thrush or excess pnd or cobblestoning.  No JVD or cervical  adenopathy. Mild accessory muscle hypertrophy. Trachea midline, nl thryroid. Chest was hyperinflated by percussion with diminished breath sounds and moderate increased exp time with  Bilateral moderate mid exp  Rhonchi    Hoover sign positive at mid inspiration. Regular rate and rhythm without murmur gallop or rub or increase  P2 or edema.  Abd: no hsm, nl excursion. Ext warm without cyanosis or clubbing.       CXR PA and Lateral:   02/17/2016 :    I personally reviewed images and agree with radiology impression as follows:   Findings consistent with a degree of congestive heart failure. No airspace consolidation. Aortic atherosclerosis.  My review :  No evidence of chf    Labs ordered/ reviewed:      Chemistry      Component Value Date/Time   NA 135 02/17/2016 0948   K 4.2 02/17/2016 0948   CL 99 02/17/2016 0948   CO2 32 02/17/2016 0948   BUN 16 02/17/2016 0948   CREATININE 0.72 02/17/2016 0948      Component Value Date/Time   CALCIUM 9.0 02/17/2016 0948        Lab Results  Component Value Date   WBC 9.0 02/17/2016   HGB 12.8 (L) 02/17/2016   HCT 38.6 (L) 02/17/2016   MCV 88.2 02/17/2016   PLT 294.0 02/17/2016        Lab Results  Component Value Date   TSH 3.03 02/17/2016     Lab Results  Component Value Date   PROBNP 40.0 02/17/2016       Lab Results  Component Value Date   ESRSEDRATE 60 (H) 02/17/2016           Assessment & Plan:

## 2016-02-17 NOTE — Patient Instructions (Addendum)
Please see patient coordinator before you leave today  to schedule sinus ct and I will call you with results  Change to symbicort 160 Take 2 puffs first thing in am and then another 2 puffs about 12 hours later.     Please remember to go to the lab and x-ray department downstairs for your tests - we will call you with the results when they are available.   Please schedule a follow up office visit in 6 weeks, call sooner if needed with pfts on return ok to do at [facility]

## 2016-02-18 LAB — RESPIRATORY ALLERGY PROFILE REGION II ~~LOC~~
Allergen, A. alternata, m6: <0.1 kU/L
Allergen, C. Herbarum, M2: <0.1 kU/L
Allergen, D pternoyssinus,d7: <0.1 kU/L
Allergen, Mulberry, t76: <0.1 kU/L
Aspergillus fumigatus, m3: <0.1 kU/L
Box Elder IgE: <0.1 kU/L
Cat Dander: <0.1 kU/L
Cockroach: <0.1 kU/L
Common Ragweed: <0.1 kU/L
D. farinae: <0.1 kU/L
IGE (IMMUNOGLOBULIN E), SERUM: 36 kU/L (ref <115)
Johnson Grass: <0.1 kU/L
Pecan/Hickory Tree IgE: <0.1 kU/L
Rough Pigweed  IgE: <0.1 kU/L
Sheep Sorrel IgE: <0.1 kU/L
Timothy Grass: <0.1 kU/L

## 2016-02-18 NOTE — Progress Notes (Signed)
Spoke with pt and notified of results per Dr. Wert. Pt verbalized understanding and denied any questions. 

## 2016-02-18 NOTE — Progress Notes (Signed)
Pt aware.

## 2016-02-19 NOTE — Assessment & Plan Note (Signed)
No evidence chf/ anemia/ thyroid dz

## 2016-02-19 NOTE — Assessment & Plan Note (Addendum)
-   PFTs 03/26/2012 FEV1  1.36 (45%) Ratio 55 and no better p B2, DLCO 65% - Spirometry 01/28/16   FEV1 0.82 (27%)  Ratio 60% . 02/02/2016   changed bevespi to symbicort 80  - 02/17/2016  After extensive coaching HFA effectiveness =    90% > try symbiocrt 160 2bid   Improved on the 80 dose s flare of uacs so try the 160 next and reduce back to 80 if any worse cough  I had an extended discussion with the patient reviewing all relevant studies completed to date and  lasting 15 to 20 minutes of a 25 minute visit    Each maintenance medication was reviewed in detail including most importantly the difference between maintenance and prns and under what circumstances the prns are to be triggered using an action plan format that is not reflected in the computer generated alphabetically organized AVS.    Please see instructions for details which were reviewed in writing and the patient given a copy highlighting the part that I personally wrote and discussed at today's ov.

## 2016-02-19 NOTE — Assessment & Plan Note (Signed)
Allergy profile 02/17/2016 >  Eos 0.3 /  IgE  35 neg RAST Sinus Ct 02/17/2016 >>>   W/u in progress, in meantime rec continue max gerd rx / use of flutter valve to prevent cyclical coughing

## 2016-02-23 ENCOUNTER — Other Ambulatory Visit: Payer: Self-pay | Admitting: Internal Medicine

## 2016-02-23 ENCOUNTER — Ambulatory Visit (INDEPENDENT_AMBULATORY_CARE_PROVIDER_SITE_OTHER)
Admission: RE | Admit: 2016-02-23 | Discharge: 2016-02-23 | Disposition: A | Discharge status: Home / Self Care | Payer: Medicare HMO | Source: Ambulatory Visit | Attending: Internal Medicine | Admitting: Internal Medicine

## 2016-02-23 DIAGNOSIS — R05 Cough: Secondary | ICD-10-CM

## 2016-02-23 DIAGNOSIS — R058 Other specified cough: Secondary | ICD-10-CM

## 2016-02-23 MED ORDER — AMOXICILLIN-POT CLAVULANATE 875-125 MG PO TABS: 1.0000 | ORAL_TABLET | Freq: Two times a day (BID) | ORAL | 0 refills | Status: DC

## 2016-02-23 NOTE — Progress Notes (Signed)
Spoke with pt and notified of results per Dr. Wert. Pt verbalized understanding and denied any questions. 

## 2016-02-25 ENCOUNTER — Institutional Professional Consult (permissible substitution): Payer: Medicare HMO | Admitting: Internal Medicine

## 2016-02-28 ENCOUNTER — Encounter: Payer: Self-pay | Admitting: Internal Medicine

## 2016-03-08 ENCOUNTER — Other Ambulatory Visit: Payer: Self-pay

## 2016-03-08 ENCOUNTER — Telehealth: Payer: Self-pay | Admitting: Internal Medicine

## 2016-03-08 MED ORDER — FLUTICASONE-SALMETEROL 115-21 MCG/ACT IN AERO: 2.0000 | INHALATION_SPRAY | Freq: Two times a day (BID) | RESPIRATORY_TRACT | 5 refills | Status: DC

## 2016-03-08 NOTE — Telephone Encounter (Signed)
Pt returning call and can be @ reached [phone].[name]

## 2016-03-08 NOTE — Telephone Encounter (Signed)
PA request received on Symbicort- formulary covered alternatives are Advair and Breo.  MW please advise if you'd like to switch pt to a covered alternative.  Thanks!   Patient Instructions   Please see patient coordinator before you leave today  to schedule sinus ct and I will call you with results   Change to symbicort 160 Take 2 puffs first thing in am and then another 2 puffs about 12 hours later.      Please remember to go to the lab and x-ray department downstairs for your tests - we will call you with the results when they are available.     Please schedule a follow up office visit in 6 weeks, call sooner if needed with pfts on return ok to do at [facility]

## 2016-03-08 NOTE — Telephone Encounter (Signed)
Spoke with pt. He is aware of the medication change. Rx has been sent in. Nothing further was needed. 

## 2016-03-08 NOTE — Telephone Encounter (Signed)
advair 115 hfa Take 2 puffs first thing in am and then another 2 puffs about 12 hours later.

## 2016-04-04 ENCOUNTER — Ambulatory Visit (INDEPENDENT_AMBULATORY_CARE_PROVIDER_SITE_OTHER): Payer: Medicare HMO | Admitting: Internal Medicine

## 2016-04-04 ENCOUNTER — Encounter: Payer: Self-pay | Admitting: Internal Medicine

## 2016-04-04 VITALS — BP 110/68 | HR 90 | Ht 70.5 in | Wt 205.0 lb

## 2016-04-04 DIAGNOSIS — J449 Chronic obstructive pulmonary disease, unspecified: Secondary | ICD-10-CM

## 2016-04-04 DIAGNOSIS — R05 Cough: Secondary | ICD-10-CM | POA: Diagnosis not present

## 2016-04-04 DIAGNOSIS — R058 Other specified cough: Secondary | ICD-10-CM

## 2016-04-04 LAB — PULMONARY FUNCTION TEST
DL/VA % pred: 93 %
DL/VA: 4.33 ml/min/mmHg/L
DLCO COR % PRED: 49 %
DLCO COR: 16.58 ml/min/mmHg
DLCO UNC: 16.39 ml/min/mmHg
DLCO unc % pred: 48 %
FEF 25-75 POST: 0.49 L/s
FEF 25-75 Pre: 0.45 L/sec
FEF2575-%Change-Post: 8 %
FEF2575-%PRED-PRE: 19 %
FEF2575-%Pred-Post: 21 %
FEV1-%Change-Post: 3 %
FEV1-%PRED-POST: 32 %
FEV1-%PRED-PRE: 31 %
FEV1-POST: 1.05 L
FEV1-Pre: 1.02 L
FEV1FVC-%Change-Post: 2 %
FEV1FVC-%PRED-PRE: 81 %
FEV6-%Change-Post: 0 %
FEV6-%PRED-POST: 40 %
FEV6-%Pred-Pre: 40 %
FEV6-POST: 1.69 L
FEV6-Pre: 1.68 L
FEV6FVC-%CHANGE-POST: 0 %
FEV6FVC-%Pred-Post: 104 %
FEV6FVC-%Pred-Pre: 104 %
FVC-%CHANGE-POST: 0 %
FVC-%Pred-Post: 39 %
FVC-%Pred-Pre: 38 %
FVC-Post: 1.73 L
FVC-Pre: 1.72 L
PRE FEV1/FVC RATIO: 59 %
Post FEV1/FVC ratio: 61 %
Post FEV6/FVC ratio: 98 %
Pre FEV6/FVC Ratio: 98 %
RV % pred: 124 %
RV: 3.22 L
TLC % PRED: 69 %
TLC: 5.04 L

## 2016-04-04 MED ORDER — TIOTROPIUM BROMIDE-OLODATEROL 2.5-2.5 MCG/ACT IN AERS: 2.0000 | INHALATION_SPRAY | Freq: Every day | RESPIRATORY_TRACT | 0 refills | Status: DC

## 2016-04-04 MED ORDER — TIOTROPIUM BROMIDE-OLODATEROL 2.5-2.5 MCG/ACT IN AERS: 2.0000 | INHALATION_SPRAY | Freq: Every day | RESPIRATORY_TRACT | 11 refills | Status: DC

## 2016-04-04 MED ORDER — ALBUTEROL SULFATE HFA 108 (90 BASE) MCG/ACT IN AERS: INHALATION_SPRAY | RESPIRATORY_TRACT | 11 refills | Status: AC

## 2016-04-04 NOTE — Progress Notes (Signed)
Subjective:    Patient ID: [name], male    DOB: Jun 16, 1940  MRN: [id number]    Brief patient profile:  33 yowm quit smoking around 2003  With "smoker's cough"  And some coughing assoc with  seasonal rhinitis spring and fall Which both improved  but since around 2010 worse again with daily cough dx as sinus dz and planning sinus surgery 10/23 and sent by [facility] ENT for pre-op pulmonary eval 02/16/2012 with GOLD III COPD criteria then     History of Present Illness  02/16/2012 1st pulmonary eval cc daily cough min prod mucus x sev years with rattling quality  and no sign sob, cough is better while sleeping does not flare first thing in am  Able to walk a mile a day at 2.7 -2.8 at grade 2 -3 s difficulty.  rec Stop fish oil and lisinopril dulera 100 Take 2 puffs first thing in am and then another 2 puffs about 12 hours later.  For cough mucinex dm 1-2 every 12 hours as needed  GERD diet   03/26/2012 f/u ov/Maylie Ashton cc variable cough mostly during the day, rattles but no production, happens sev times at most typically p eating. Min sob, overall much better since sinus surgery and off acei Stop the diovan when you get your cozaar (losartan) 50 mg one daily  Try turdorza one twice daily x 2 weeks to see if it helps your breathing or coughing and if not stop it, if you like it refill it If continue to cough Try prilosec 20mg   Take 30-60 min before first meal of the day and Pepcid 20 mg one bedtime until cough is completely gone   GERD diet    06/26/2012 f/u ov/Jahnya Trindade cc daily cough x 4 years worse p bfast and assoc with sense of pnds no better on flonase, better on allegra d but stopped working x one year or so rec Try prilosec 20mg   Take 30-60 min before first meal of the day and Pepcid 20 mg one bedtime  Dulera 100 Take 2 puffs first thing in am and then another 2 puffs about 12 hours later.  Dymista twice daily each nostil If still draining try adding chlortrimeton 4 mg every 6 hours (may  cause drowsiness) Prednisone 10 mg take  4 each am x 2 days,   2 each am x 2 days,  1 each am x2days and stop         07/10/2012 ov / Pulmonary f/u 100% better on above regimen, no sob, no cough rec Try off dulera to see if any symptoms flare but no other changes for the next month Please schedule a follow up office visit in 4 weeks, sooner if needed > did not return      02/02/2016  Pulmonary consultation/ new sob  Chief Complaint  Patient presents with  . Pulmonary Consult    Referred by Dr Kathryne Eriksson. Pt seen here last in 2014. He c/o increased SOB for the past month. He states he can not walk longer than 5 min on level ground due to SOB. He also c/o "deep cough", hard to produce sputum and he is unsure of color.   Baseline coughing  Since last ov / no prednisone/ no inhalers no gerd rx only flonase  gradually worse cough x 3y then breathing worse x one month Cough is deeper / more congested but not productive still mostly daytime and worse p meals  Was walking sev miles a day until  one month prior to OV  Now sob room to room  Already on bevespi and no better after zpak / prednisone  rec For cough > mucinex dm up to 1200 mg every hours and use flutter valve  symbicort 80 Take 2 puffs first thing in am and then another 2 puffs about 12 hours later.  Pantoprazole (protonix) 40 mg   Take  30-60 min before first meal of the day and Pepcid (famotidine)  20 mg one @  bedtime until return to office - this is the best way to tell whether stomach acid is contributing to your problem.   GERD diet  Prednisone 10 mg take  4 each am x 2 days,   2 each am x 2 days,  1 each am x 2 days and stop  Augmentin 875 mg take one pill twice daily  X 10 days - take at breakfast and supper with large glass of water.  It would help reduce the usual side effects (diarrhea and yeast infections) if you ate cultured yogurt at lunch.         02/17/2016  f/u ov/Sharetha Newson re: GOLD II copd/ symb 80 2bid and no need for  saba  Chief Complaint  Patient presents with  . Follow-up    Breathing has improved some. He denies any new co's. Still coughing some.    back to walking up to a mile and a half  / flutter valve helping with am cough  rec Please see patient coordinator before you leave today  to schedule sinus ct  > pos > referred to ENT  Change to symbicort 160 Take 2 puffs first thing in am and then another 2 puffs about 12 hours later.     03/02/16 seen by Bates/ 2 weeks cipro > drainage resolved      04/04/2016  f/u ov/Rowdy Guerrini re: GOLD III on symb 160 2bid  Chief Complaint  Patient presents with  . Follow-up    for PFT results   MMRC2 = can't walk a nl pace on a flat grade s sob but does fine slow and flat eg can walk up to a quarter mile and stops about half way  Could not tell symbicort was better than advair Worse symptoms at hs of cough and wheeze and sob  No obvious day to day or daytime variability or assoc excess/ purulent sputum or mucus plugs or hemoptysis or cp or chest tightness, subjective wheeze or overt sinus or hb symptoms. No unusual exp hx or h/o childhood pna/ asthma or knowledge of premature birth.   Also denies any obvious fluctuation of symptoms with weather or environmental changes or other aggravating or alleviating factors except as outlined above   Current Medications, Allergies, Complete Past Medical History, Past Surgical History, Family History, and Social History were reviewed in [employer] record.  ROS  The following are not active complaints unless bolded sore throat, dysphagia, dental problems, itching, sneezing,  nasal congestion or excess/ purulent secretions, ear ache,   fever, chills, sweats, unintended wt loss, classically pleuritic or exertional cp,  orthopnea pnd or leg swelling, presyncope, palpitations, abdominal pain, anorexia, nausea, vomiting, diarrhea  or change in bowel or bladder habits, change in stools or urine, dysuria,hematuria,   rash, arthralgias, visual complaints, headache, numbness, weakness or ataxia or problems with walking or coordination,  change in mood/affect or memory.              Objective:   Physical Exam  amb wm  nad / vital signs reviewed - sats 94% on RA on Arrival   Wt  235 06/26/2012 > 07/10/2012  237 > 02/02/2016 202 > 02/17/2016 204 > 04/04/2016 205     02/16/12 229 lb (103.874 kg)  05/19/11 225 lb (102.059 kg)  05/19/11 225 lb (102.059 kg)    HEENT mild turbinate edema.  Top dentures/ Oropharynx no thrush or excess pnd or cobblestoning.  No JVD or cervical adenopathy. Mild accessory muscle hypertrophy. Trachea midline, nl thryroid. Chest was hyperinflated by percussion with diminished breath sounds and moderate increased exp time with  Bilateral late mid exp  Rhonchi  s cough on exp    Hoover sign positive at mid inspiration. Regular rate and rhythm without murmur gallop or rub or increase P2 or edema.  Abd: no hsm, nl excursion. Ext warm without cyanosis or clubbing.                     Assessment & Plan:

## 2016-04-04 NOTE — Patient Instructions (Addendum)
Plan A = Automatic =  Stiolto 2 pffs each am - fill the prescription if you feel it helps and insurance will pay, otherwise go back to the advair     Plan B = Backup Only use your albuterol (Ventolin or Proair)as a rescue medication to be used if you can't catch your breath by resting or doing a relaxed purse lip breathing pattern.  - The less you use it, the better it will work when you need it. - Ok to use the inhaler up to 2 puffs  every 4 hours if you must but call for appointment if use goes up over your usual need - Don't leave home without it !!  (think of it like the spare tire for your car)   Please schedule a follow up office visit in 6 weeks, call sooner if needed with all active meds in hand

## 2016-04-05 NOTE — Assessment & Plan Note (Signed)
-   PFTs 03/26/2012 FEV1  1.36 (45%) Ratio 55 and no better p B2, DLCO 65% - Spirometry 01/28/16   FEV1 0.82 (27%)  Ratio 60% . 02/02/2016   changed bevespi to symbicort 80  - 02/17/2016  After extensive coaching HFA effectiveness =    90% > try symbiocrt 160 2bid   - PFT's  04/04/2016  FEV1 1.05 (32 % ) ratio 61  p 3  % improvement from saba p symb 160  prior to study with DLCO  48/49  % corrects to 93  % for alv volume   - 04/04/2016  After extensive coaching HFA effectiveness =    90% try stiolto   Pt is Group B in terms of symptom/risk and laba/lama therefore appropriate rx at this point with the other option being lama/laba/ics if exacerbates on lama/laba  I had an extended discussion with the patient reviewing all relevant studies completed to date and  lasting 15 to 20 minutes of a 25 minute visit    Each maintenance medication was reviewed in detail including most importantly the difference between maintenance and prns and under what circumstances the prns are to be triggered using an action plan format that is not reflected in the computer generated alphabetically organized AVS.    Please see AVS for unique instructions that I personally wrote and verbalized to the the pt in detail and then reviewed with pt  by my nurse highlighting any  changes in therapy recommended at today's visit to their plan of care.

## 2016-04-05 NOTE — Assessment & Plan Note (Signed)
Allergy profile 02/17/2016 >  Eos 0.3 /  IgE  35 neg RAST Sinus Ct 02/23/2016 > Postsurgical changes as described above. Air-fluid levels are noted in both maxillary sinuses consistent with sinusitis. 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

## 2016-05-03 ENCOUNTER — Encounter: Payer: Self-pay | Admitting: Internal Medicine

## 2016-05-03 ENCOUNTER — Ambulatory Visit (INDEPENDENT_AMBULATORY_CARE_PROVIDER_SITE_OTHER): Payer: Medicare HMO | Admitting: Internal Medicine

## 2016-05-03 VITALS — BP 104/62 | HR 87 | Ht 70.5 in | Wt 205.0 lb

## 2016-05-03 DIAGNOSIS — R05 Cough: Secondary | ICD-10-CM

## 2016-05-03 DIAGNOSIS — R0602 Shortness of breath: Secondary | ICD-10-CM

## 2016-05-03 DIAGNOSIS — J449 Chronic obstructive pulmonary disease, unspecified: Secondary | ICD-10-CM | POA: Diagnosis not present

## 2016-05-03 DIAGNOSIS — R058 Other specified cough: Secondary | ICD-10-CM

## 2016-05-03 MED ORDER — PANTOPRAZOLE SODIUM 40 MG PO TBEC: 40.0000 mg | DELAYED_RELEASE_TABLET | Freq: Every day | ORAL | 2 refills | Status: AC

## 2016-05-03 MED ORDER — PREDNISONE 10 MG PO TABS: ORAL_TABLET | ORAL | 0 refills | Status: DC

## 2016-05-03 NOTE — Patient Instructions (Addendum)
Plan A = Automatic =Stiolto 2 pffs each am   Plan B = Backup Only use your albuterol as a rescue medication to be used if you can't catch your breath by resting or doing a relaxed purse lip breathing pattern.  - The less you use it, the better it will work when you need it. - Ok to use the inhaler up to 2 puffs  every 4 hours if you must but call for appointment if use goes up over your usual need - Don't leave home without it !!  (think of it like the spare tire for your car)   Prednisone 10 mg take  4 each am x 2 days,   2 each am x 2 days,  1 each am x 2 days and stop   For cough mucinex dm 1200 mg every 12 hours and use the flutter valve as much as you possibly can   Please see patient coordinator before you leave today  to schedule sinus ct and  High resolution   Pantoprazole (protonix) 40 mg   Take  30-60 min before first meal of the day and Pepcid (famotidine)  20 mg one @  bedtime until return to office - this is the best way to tell whether stomach acid is contributing to your problem.    Please remember to go to the lab  department downstairs for your tests - we will call you with the results when they are available.  Please schedule a follow up office visit in 2  weeks, sooner if needed - with all medications including over the counters and all inhalers

## 2016-05-03 NOTE — Progress Notes (Signed)
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  rec Stop fish oil and lisinopril dulera 100 Take 2 puffs first thing in am and then another 2 puffs about 12 hours later.  For cough mucinex dm 1-2 every 12 hours as needed  GERD diet   03/26/2012 Chang/u ov/[name] Chang cc variable cough mostly during the day, rattles but no production, happens sev times at most typically p eating. Min sob, overall much better since sinus surgery and off acei Stop the diovan when you get your cozaar (losartan) 50 mg one daily  Try turdorza one twice daily x 2 weeks to see if it helps your breathing or coughing and if not stop it, if you like it refill it If continue to cough Try prilosec 20mg   Take 30-60 min before first meal of the day and Pepcid 20 mg one bedtime until cough is completely gone   GERD diet    06/26/2012 Chang/u ov/[name] Chang cc daily cough x 4 years worse p bfast and assoc with sense of pnds no better on flonase, better on allegra d but stopped working x one year or so rec Try prilosec 20mg   Take 30-60 min before first meal of the day and Pepcid 20 mg one bedtime  Dulera 100 Take 2 puffs first thing in am and then another 2 puffs about 12 hours later.  Dymista twice daily each nostil If still draining try adding chlortrimeton 4 mg every 6 hours (may  cause drowsiness) Prednisone 10 mg take  4 each am x 2 days,   2 each am x 2 days,  1 each am x2days and stop         07/10/2012 ov / [name] Chang/u 100% better on above regimen, no sob, no cough rec Try off dulera to see if any symptoms flare but no other changes for the next month Please schedule a follow up office visit in 4 weeks, sooner if needed > did not return      02/02/2016  [name] consultation/ new sob  Chief Complaint  Patient presents with  . [name] Chang    Referred by Dr [name] Chang. Pt seen here last in 2014. He c/o increased SOB for the past month. He states he can not walk longer than 5 min on level ground due to SOB. He also c/o "deep cough", hard to produce sputum and he is unsure of color.   Baseline coughing  Since last ov / no prednisone/ no inhalers no gerd rx only flonase  gradually worse cough x 3y then breathing worse x one month Cough is deeper / more congested but not productive still mostly daytime and worse p meals  Was walking sev miles a day until  one month prior to OV  Now sob room to room  Already on bevespi and no better after zpak / prednisone  rec For cough > mucinex dm up to 1200 mg every hours and use flutter valve  symbicort 80 Take 2 puffs first thing in am and then another 2 puffs about 12 hours later.  Pantoprazole (protonix) 40 mg   Take  30-60 min before first meal of the day and Pepcid (famotidine)  20 mg one @  bedtime until return to office - this is the best way to tell whether stomach acid is contributing to your problem.   GERD diet  Prednisone 10 mg take  4 each am x 2 days,   2 each am x 2 days,  1 each am x 2 days and stop  Augmentin 875 mg take one pill twice daily  X 10 days - take at breakfast and supper with large glass of water.  It would help reduce the usual side effects (diarrhea and yeast infections) if you ate cultured yogurt at lunch.         02/17/2016  Chang/u ov/[name] Chang re: GOLD II copd/ symb 80 2bid and no need for  saba  Chief Complaint  Patient presents with  . Follow-up    Breathing has improved some. He denies any new co's. Still coughing some.    back to walking up to a mile and a half  / flutter valve helping with am cough  rec Please see patient coordinator before you leave today  to schedule sinus ct  > pos > referred to ENT  Change to symbicort 160 Take 2 puffs first thing in am and then another 2 puffs about 12 hours later.     03/02/16 seen by [name]/ 2 weeks cipro > drainage resolved      04/04/2016  Chang/u ov/[name] Chang re: GOLD III on symb 160 2bid  Chief Complaint  Patient presents with  . Follow-up    for PFT results   MMRC2 = can't walk a nl pace on a flat grade s sob but does fine slow and flat eg can walk up to a quarter mile and stops about half way  Could not tell symbicort was better than advair Worse symptoms at hs of cough and wheeze and sob rec Plan A = Automatic =  Stiolto 2 pffs each am - fill the prescription if you feel it helps and insurance will pay, otherwise go back to the advair  Plan B = Backup Only use your albuterol (Ventolin or Proair)as a rescue medication   Please schedule a follow up office visit in 6 weeks, call sooner if needed with all active meds in hand   05/03/2016 acute extended ov/[name] Chang re: cough x 3 years/ worse sob x sev months/ did not bring meds / easily confused with details  Chief Complaint  Patient presents with  . Acute Visit    Pt states he has SOB during physical exertion that resolves after resting, lots of wheezing, cough with mucus production that is swallowed. Inhalers prescribed last visit has had little effect on pt status   pt feels cough is better but has extremely congested sounding honking upper airway cough during interview Not sure what color his mucus is  Cough is worse after meals  Wheeze and sob worse when lie down  Did not follow gerd plan / no h2hs and still using mint products     No obvious day to day or daytime variability  or assoc excess/ purulent sputum production or mucus plugs or hemoptysis or cp or chest tightness, subjective wheeze or overt sinus or hb symptoms. No unusual exp hx or h/o childhood pna/ asthma or knowledge of premature birth.   Also denies any obvious fluctuation of symptoms with weather or environmental changes or other aggravating or alleviating factors except as outlined above   Current Medications, Allergies, Complete Past Medical History, Past Surgical History, Family History, and Social History were reviewed in [employer] record.  ROS  The following are not active complaints unless bolded sore throat, dysphagia, dental problems, itching, sneezing,  nasal congestion or excess/ purulent secretions, ear ache,   fever, chills, sweats, unintended wt loss, classically pleuritic or exertional cp,  orthopnea pnd or leg swelling, presyncope, palpitations, abdominal pain, anorexia, nausea, vomiting, diarrhea  or change in bowel or bladder habits, change in stools or urine, dysuria,hematuria,  rash, arthralgias, visual complaints, headache, numbness, weakness or ataxia or problems with walking or coordination,  change in mood/affect or memory.              Objective:   Physical Exam  amb wm  nad / vital signs reviewed - sats 95% on RA on Arrival   Wt  235 06/26/2012 > 07/10/2012  237 > 02/02/2016 202 > 02/17/2016 204 > 04/04/2016 205     02/16/12 229 lb (103.874 kg)  05/19/11 225 lb (102.059 kg)  05/19/11 225 lb (102.059 kg)    HEENT mild turbinate edema.  Top dentures/ Oropharynx no thrush or excess pnd or cobblestoning.  No JVD or cervical adenopathy. Mild accessory muscle hypertrophy. Trachea midline, nl thryroid. Chest was hyperinflated by percussion with diminished breath sounds and lots of transmitted upper airway noise and  moderate increased exp time with  Bilateral late mid exp Rhonchi  s cough on exp    Hoover sign positive at mid inspiration. Regular rate and  rhythm without murmur gallop or rub or increase P2 or edema.  Abd: no hsm, nl excursion. Ext warm without cyanosis or clubbing.         Labs ordered 05/03/2016  D dimer/ tsh / bnp, bmet > did not go to lab as requested          Assessment & Plan:

## 2016-05-04 ENCOUNTER — Encounter: Payer: Self-pay | Admitting: Internal Medicine

## 2016-05-04 NOTE — Assessment & Plan Note (Signed)
Symptoms are markedly disproportionate to objective findings and not clear this is a lung problem but pt does appear to have difficult airway management issues.   Lack of improvement  on a verified empirical regimen (which I have not been able to confirm yet) could mean an alternative diagnosis(that's why labs were ordered but he did not go as requested) , persistence of the disease state (eg sinusitis or bronchiectasis> ct's ordered 05/03/2016 ) , or inadequacy of currently available therapy (eg no medical rx available for non-acid gerd so critical he complete the diet/ lifestyle recs which he has also failed to do)   Offered to refer his to alternate provider if not willing to meet me half way and bring all active meds with him to each ov

## 2016-05-04 NOTE — Assessment & Plan Note (Signed)
-   PFTs 03/26/2012 FEV1  1.36 (45%) Ratio 55 and no better p B2, DLCO 65% - Spirometry 01/28/16   FEV1 0.82 (27%)  Ratio 60% . 02/02/2016   changed bevespi to symbicort 80  - 02/17/2016  After extensive coaching HFA effectiveness =    90% > try symbiocrt 160 2bid  - PFT's  04/04/2016  FEV1 1.05 (32 % ) ratio 61  p 3  % improvement from saba p symb 160  prior to study with DLCO  48/49  % corrects to 93  % for alv volume   - 04/04/2016  After extensive coaching HFA effectiveness =    90% try stiolto    Severe copd but no real evidence of aecopd at this point so no change in rx needed   see avs for instructions unique to this ov

## 2016-05-04 NOTE — Assessment & Plan Note (Signed)
Allergy profile 02/17/2016 >  Eos 0.3 /  IgE  35 neg RAST Sinus Ct 02/23/2016 > Postsurgical changes as described above. Air-fluid levels are noted in both maxillary sinuses consistent with sinusitis. 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    This may partially respond to steroids and look like asthma and post nasal drainage but never erradicated completely unless the cough and the secondary reflux are eliminated, preferably both at the same time.  While not intuitively obvious, many patients with chronic low grade reflux do not cough until there is a secondary insult that disturbs the protective epithelial barrier and exposes sensitive nerve endings.  This can be viral or direct physical injury such as with an endotracheal tube.   The point is that once this occurs, it is difficult to eliminate using anything but a maximally effective acid suppression regimen at least in the short run, accompanied by an appropriate diet to address non acid GERD.   rec max rx for gerd/check sinus ct/ control cyclical cough with mucinex dm/ flutter   I had an extended discussion with the patient reviewing all relevant studies completed to date and  lasting 25 minutes of a 40  minute acute office visit    re  non-specific but potentially very serious pulmonary symptoms of unknown etiology.  Each maintenance medication was reviewed in detail  including most importantly the difference between maintenance and prns and under what circumstances the prns are to be triggered using an action plan format that is not reflected in the computer generated alphabetically organized AVS.    Please see AVS for specific instructions unique to this office visit that I personally wrote and verbalized to the the pt in detail and then reviewed with pt  by my nurse highlighting any  changes in therapy recommended at today's visit to their plan of care.

## 2016-05-05 ENCOUNTER — Telehealth: Payer: Self-pay | Admitting: *Deleted

## 2016-05-05 ENCOUNTER — Ambulatory Visit (INDEPENDENT_AMBULATORY_CARE_PROVIDER_SITE_OTHER)
Admission: RE | Admit: 2016-05-05 | Discharge: 2016-05-05 | Disposition: A | Discharge status: Home / Self Care | Payer: Medicare HMO | Source: Ambulatory Visit | Attending: Internal Medicine | Admitting: Internal Medicine

## 2016-05-05 ENCOUNTER — Other Ambulatory Visit (INDEPENDENT_AMBULATORY_CARE_PROVIDER_SITE_OTHER): Payer: Medicare HMO

## 2016-05-05 DIAGNOSIS — R0602 Shortness of breath: Secondary | ICD-10-CM

## 2016-05-05 DIAGNOSIS — R05 Cough: Secondary | ICD-10-CM

## 2016-05-05 DIAGNOSIS — R058 Other specified cough: Secondary | ICD-10-CM

## 2016-05-05 LAB — CBC WITH DIFFERENTIAL/PLATELET
BASOS PCT: 0.2 % (ref 0.0–3.0)
Basophils Absolute: 0 10*3/uL (ref 0.0–0.1)
Eosinophils Absolute: 0 10*3/uL (ref 0.0–0.7)
Eosinophils Relative: 0.1 % (ref 0.0–5.0)
HCT: 38.5 % — ABNORMAL LOW (ref 39.0–52.0)
HEMOGLOBIN: 12.9 g/dL — AB (ref 13.0–17.0)
LYMPHS ABS: 0.7 10*3/uL (ref 0.7–4.0)
LYMPHS PCT: 7.6 % — AB (ref 12.0–46.0)
MCHC: 33.5 g/dL (ref 30.0–36.0)
MCV: 87.4 fl (ref 78.0–100.0)
MONO ABS: 0.1 10*3/uL (ref 0.1–1.0)
Monocytes Relative: 0.9 % — ABNORMAL LOW (ref 3.0–12.0)
Neutro Abs: 8.9 10*3/uL — ABNORMAL HIGH (ref 1.4–7.7)
Neutrophils Relative %: 91.2 % — ABNORMAL HIGH (ref 43.0–77.0)
Platelets: <20 10*3/uL — CL (ref 150.0–400.0)
RBC: 4.4 Mil/uL (ref 4.22–5.81)
RDW: 13.9 % (ref 11.5–15.5)
WBC: 9.8 10*3/uL (ref 4.0–10.5)

## 2016-05-05 LAB — BASIC METABOLIC PANEL
BUN: 16 mg/dL (ref 6–23)
CALCIUM: 9.2 mg/dL (ref 8.4–10.5)
CO2: 32 mEq/L (ref 19–32)
CREATININE: 0.79 mg/dL (ref 0.40–1.50)
Chloride: 96 mEq/L (ref 96–112)
GFR: 101.7 mL/min (ref >60.00)
GLUCOSE: 187 mg/dL — AB (ref 70–99)
Potassium: 4.6 mEq/L (ref 3.5–5.1)
Sodium: 133 mEq/L — ABNORMAL LOW (ref 135–145)

## 2016-05-05 LAB — TSH: TSH: 1.64 u[IU]/mL (ref 0.35–4.50)

## 2016-05-05 LAB — BRAIN NATRIURETIC PEPTIDE: Pro B Natriuretic peptide (BNP): 17 pg/mL (ref 0.0–100.0)

## 2016-05-05 MED ORDER — AMOXICILLIN-POT CLAVULANATE 875-125 MG PO TABS: 1.0000 | ORAL_TABLET | Freq: Two times a day (BID) | ORAL | 0 refills | Status: DC

## 2016-05-05 NOTE — Telephone Encounter (Signed)
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  It would help reduce the usual side effects (diarrhea and yeast infections) if you ate cultured yogurt at lunch.

## 2016-05-05 NOTE — Progress Notes (Signed)
Spoke with pt and notified of results per Dr. Melvyn Novas. Pt verbalized understanding. He was advised to go to [facility] ED ASAP. He reports he may not go b/c "this sounds like an all night job". I again advised that he could have severe bleeding and this problem needs immediate attn. He verbalized understanding.

## 2016-05-05 NOTE — Telephone Encounter (Signed)
Spoke with pt and notified of results per Dr. Melvyn Novas. Pt verbalized understanding and denied any questions. He agrees to return for labs

## 2016-05-06 ENCOUNTER — Emergency Department (HOSPITAL_COMMUNITY)
Admission: EM | Admit: 2016-05-06 | Discharge: 2016-05-06 | Disposition: A | Discharge status: Home / Self Care | Payer: Medicare HMO | Attending: Emergency Medicine | Admitting: Emergency Medicine

## 2016-05-06 ENCOUNTER — Encounter (HOSPITAL_COMMUNITY): Payer: Self-pay | Admitting: Emergency Medicine

## 2016-05-06 DIAGNOSIS — Z87891 Personal history of nicotine dependence: Secondary | ICD-10-CM | POA: Insufficient documentation

## 2016-05-06 DIAGNOSIS — D696 Thrombocytopenia, unspecified: Secondary | ICD-10-CM | POA: Insufficient documentation

## 2016-05-06 DIAGNOSIS — J449 Chronic obstructive pulmonary disease, unspecified: Secondary | ICD-10-CM | POA: Diagnosis not present

## 2016-05-06 DIAGNOSIS — R7989 Other specified abnormal findings of blood chemistry: Secondary | ICD-10-CM | POA: Diagnosis present

## 2016-05-06 DIAGNOSIS — Z794 Long term (current) use of insulin: Secondary | ICD-10-CM | POA: Diagnosis not present

## 2016-05-06 DIAGNOSIS — E119 Type 2 diabetes mellitus without complications: Secondary | ICD-10-CM | POA: Insufficient documentation

## 2016-05-06 DIAGNOSIS — Z7982 Long term (current) use of aspirin: Secondary | ICD-10-CM | POA: Diagnosis not present

## 2016-05-06 DIAGNOSIS — Z79899 Other long term (current) drug therapy: Secondary | ICD-10-CM | POA: Diagnosis not present

## 2016-05-06 LAB — D-DIMER, QUANTITATIVE: D-Dimer, Quant: 1.91 mcg/mL FEU — ABNORMAL HIGH (ref <0.50)

## 2016-05-06 MED ORDER — PREDNISONE 10 MG PO TABS: 100.0000 mg | ORAL_TABLET | Freq: Every day | ORAL | 0 refills | Status: DC

## 2016-05-06 MED ORDER — PREDNISONE 20 MG PO TABS: 60.0000 mg | ORAL_TABLET | Freq: Once | ORAL | Status: DC

## 2016-05-06 MED ORDER — PREDNISONE 20 MG PO TABS
80.0000 mg | ORAL_TABLET | Freq: Once | ORAL | Status: AC
  Administered 2016-05-06: 80 mg via ORAL
  Filled 2016-05-06: qty 4

## 2016-05-06 NOTE — ED Notes (Signed)
Per Mickle Plumb from [facility] pt being referred here by Redmond Pulling PCP for evaluation of abnormal lab. Platelet count recently checked was 20 and now 6. Pt coming POV.

## 2016-05-06 NOTE — ED Provider Notes (Signed)
[name] Provider Note   CSN: [id number] Arrival date & time: 05/06/16  1933     History   Chief Complaint Chief Complaint  Patient presents with  . Abnormal Lab    HPI [name] is a 76 y.o. male.  The history is provided by the patient. No language interpreter was used.  Abnormal Lab    [name] is a 76 y.o. male who presents to the Emergency Department complaining of abnormal lab.  He was referred to the emergency department for low platelet count. He had routine labs performed at a pulmonology appointment yesterday and his lipids were noted to be 20,000. He did not believe it and went to his PCP and had them recheck today and they were 6000. He reports bruising to arms over the last day. He is followed by pulmonology for progressive shortness of breath and cough over the last 2-3 months. He has not been responding to treatments with courses of steroids and antibiotics. 2 days ago he was started on prednisone and amoxicillin for recurrent pulmonary infection.He had light bleeding from his nose yesterday that is now resolved. No hematochezia, melena, hematuria. Past Medical History:  Diagnosis Date  . Adenomatous colon polyp    01/2006  . Chronic pansinusitis   . Diabetes mellitus   . Diverticula of colon   . Hyperlipemia    under control now with meds  . Kidney stones    Stones in past  . Sinus drainage    Cough with post nasal drip    Patient Active Problem List   Diagnosis Date Noted  . Upper airway cough syndrome 02/17/2016  . SOB (shortness of breath) 02/17/2016  . Cough while on acei and with active sinusitis 02/17/2012  . COPD  GOLD III  02/17/2012  . HBP (high blood pressure) 02/17/2012    Past Surgical History:  Procedure Laterality Date  . COLONOSCOPY    . ESOPHAGOGASTRODUODENOSCOPY    . ESOPHAGOGASTRODUODENOSCOPY  05/19/2011   Procedure: ESOPHAGOGASTRODUODENOSCOPY (EGD);  Surgeon: Wonda Horner, MD;  Location: [facility];   Service: Endoscopy;  Laterality: N/A;  stat CBC  APC also needed  . HOT HEMOSTASIS  05/19/2011   Procedure: HOT HEMOSTASIS (ARGON PLASMA COAGULATION/BICAP);  Surgeon: Wonda Horner, MD;  Location: [facility];  Service: Endoscopy;  Laterality: N/A;  . spinal cyst         Home Medications    Prior to Admission medications   Medication Sig Start Date End Date Taking? Authorizing Provider  albuterol (PROAIR HFA) 108 (90 Base) MCG/ACT inhaler 2 puffs every 4 hours as needed only  if your can't catch your breath 04/04/16   Tanda Rockers, MD  amoxicillin-clavulanate (AUGMENTIN) 875-125 MG tablet Take 1 tablet by mouth 2 (two) times daily. 05/05/16   Tanda Rockers, MD  aspirin 81 MG tablet Take 81 mg by mouth daily.     Historical Provider, MD  dextromethorphan-guaiFENesin (MUCINEX DM) 30-600 MG 12hr tablet Take 1 tablet by mouth 2 (two) times daily as needed for cough.    Historical Provider, MD  DOCOSAHEXAENOIC ACID PO Take 1 g by mouth.    Historical Provider, MD  famotidine (PEPCID) 20 MG tablet One at bedtime 02/02/16   Tanda Rockers, MD  fluticasone [facility]) 50 MCG/ACT nasal spray  03/10/13   Historical Provider, MD  Ginkgo Biloba 40 MG TABS Take 150 mg by mouth daily.    Historical Provider, MD  Glucosamine-Chondroit-Vit C-Mn (GLUCOSAMINE 1500 COMPLEX PO) Take 1,500  mg by mouth 2 (two) times daily.    Historical Provider, MD  Insulin Glargine (TOUJEO SOLOSTAR) 300 UNIT/ML SOPN Inject 45 Units into the skin daily.    Historical Provider, MD  losartan (COZAAR) 50 MG tablet Take 1 tablet (50 mg total) by mouth daily. 03/26/12   Tanda Rockers, MD  metFORMIN (GLUCOPHAGE) 1000 MG tablet Take 1,000 mg by mouth 2 (two) times daily with a meal.    Historical Provider, MD  Multiple Vitamins-Minerals (MULTIVITAMIN PO) Take by mouth 1 day or 1 dose.    Historical Provider, MD  pantoprazole (PROTONIX) 40 MG tablet Take 1 tablet (40 mg total) by mouth daily. Take 30-60 min before first meal of the day  05/03/16   Tanda Rockers, MD  predniSONE (DELTASONE) 10 MG tablet Take 10 tablets (100 mg total) by mouth daily. 05/06/16   Quintella Reichert, MD  Respiratory Therapy Supplies (FLUTTER) DEVI Use as directed 02/02/16   Tanda Rockers, MD  simvastatin (ZOCOR) 80 MG tablet Take 80 mg by mouth at bedtime.    Historical Provider, MD  Tiotropium Bromide-Olodaterol (STIOLTO RESPIMAT) 2.5-2.5 MCG/ACT AERS Inhale 2 puffs into the lungs daily. 04/04/16   Tanda Rockers, MD  UNABLE TO FIND Med Name: [employer] otc daily    Historical Provider, MD    Family History Family History  Problem Relation Age of Onset  . Colon cancer Mother   . Heart disease Mother   . Liver cancer Father   . Heart disease Father     Social History Social History  Substance Use Topics  . Smoking status: Former Smoker    Packs/day: 1.00    Years: 42.00    Types: Cigarettes    Start date: 05/02/1959    Quit date: 05/01/2001  . Smokeless tobacco: Never Used  . Alcohol use Yes     Comment: social     Allergies   Patient has no known allergies.   Review of Systems Review of Systems  All other systems reviewed and are negative.    Physical Exam Updated Vital Signs BP 122/75 (BP Location: Right Arm)   Pulse 90   Temp 97.7 F (36.5 C) (Oral)   Resp 16   Ht 5\' 10"  (1.778 m)   Wt 200 lb (90.7 kg)   SpO2 95%   BMI 28.70 kg/m   Physical Exam  Constitutional: He is oriented to person, place, and time. He appears well-developed and well-nourished.  HENT:  Head: Normocephalic and atraumatic.  Cardiovascular: Normal rate and regular rhythm.   No murmur heard. Pulmonary/Chest: Effort normal and breath sounds normal. No respiratory distress.  Abdominal: Soft. There is no tenderness. There is no rebound and no guarding.  Musculoskeletal: He exhibits no edema or tenderness.  Neurological: He is alert and oriented to person, place, and time.  Skin: Skin is warm and dry. There is pallor.  Scattered  ecchymosis of BUE  Psychiatric: He has a normal mood and affect. His behavior is normal.  Nursing note and vitals reviewed.    ED Treatments / Results  Labs (all labs ordered are listed, but only abnormal results are displayed) Labs Reviewed - No data to display  EKG  EKG Interpretation None       Radiology Ct Chest High Resolution  Result Date: 05/05/2016 CLINICAL DATA:  Chronic cough and worsening dyspnea for 3 months. COPD. Former smoker. EXAM: CT CHEST WITHOUT CONTRAST TECHNIQUE: Multidetector CT imaging of the chest was  performed following the standard protocol without intravenous contrast. High resolution imaging of the lungs, as well as inspiratory and expiratory imaging, was performed. COMPARISON:  02/17/2016 chest radiograph. FINDINGS: Cardiovascular: Normal heart size. Trace pericardial effusion/ thickening. Left main, left anterior descending, left circumflex and right coronary atherosclerosis. Atherosclerotic nonaneurysmal thoracic aorta. Normal caliber pulmonary arteries. Mediastinum/Nodes: No discrete thyroid nodules. Unremarkable esophagus. No pathologically enlarged axillary, mediastinal or gross hilar lymph nodes, noting limited sensitivity for the detection of hilar adenopathy on this noncontrast study. Lungs/Pleura: No pneumothorax. Small to moderate dependent pleural effusions bilaterally, slightly greater on the left. Mild centrilobular and paraseptal emphysema with diffuse bronchial wall thickening. There is patchy subpleural reticulation throughout both lungs. There are extensive patchy tree-in-bud opacities throughout both lungs involving all lung lobes, most prominent in the right middle lobe, lingula and lower lobes. There is scattered mild to moderate cylindrical and varicoid bronchiectasis throughout both lungs, most prominent at the areas of tree-in-bud opacity in the right middle lobe, lingula and basilar lower lobes. There are bandlike subpleural foci of  consolidation with associated volume loss and mild distortion in the medial right middle lobe and lingula. Patchy areas of consolidation in the basilar/dependent lower lobes bilaterally, likely a combination of compressive atelectasis and bandlike subpleural consolidation. No frank honeycombing. No significant lobular air trapping on the expiration sequence. No lung masses. Upper abdomen: Simple 1.0 cm posterior upper right renal cyst. Musculoskeletal: No aggressive appearing focal osseous lesions. Mild thoracic spondylosis. IMPRESSION: 1. Complex pulmonary parenchymal pattern of disease. 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 There is also extensive subpleural reticulation throughout both lungs, which appears out of proportion to the expected findings from atypical mycobacterial infection, and may indicate an underlying interstitial lung disease such as nonspecific interstitial pneumonia (NSIP) or usual interstitial pneumonia (UIP). No frank honeycombing. A follow-up high-resolution chest CT in 12 months is recommended to assess temporal pattern stability. 2. Small to moderate dependent bilateral pleural effusions, slightly greater on the left. 3. Mild emphysema with diffuse bronchial wall thickening, suggesting COPD. 4. Trace pericardial effusion/thickening. 5. Aortic atherosclerosis. left main and 3 vessel coronary atherosclerosis. Electronically Signed   By: Ilona Sorrel M.D.   On: 05/05/2016 10:25   Ct Maxillofacial Limited Wo Contrast  Result Date: 05/05/2016 CLINICAL DATA:  Previous sinus infection. Sinus pressure and drainage have resolved. Persistent shortness of breath. EXAM: CT PARANASAL SINUS LIMITED WITHOUT CONTRAST TECHNIQUE: Non-contiguous  multidetector CT images of the paranasal sinuses were obtained in a single plane without contrast. COMPARISON:  Limited sinus CT 02/23/2016 FINDINGS: Previously noted in fluid levels within bilateral maxillary sinuses have near completely resolved. Minimal residual fluid and mucosal thickening is present. Maxillary antrostomies are again noted. There is persistent opacification of residual left ethmoid air cells and the left frontal sinus. Partial ethmoidectomies have been performed bilaterally. Residual mucosal thickening is present in the right ethmoid air cells. The frontal sinus has cleared on the right. Mild mucosal thickening within the sphenoid sinuses remains, significantly improved. Minimal fluid is present in the inferior mastoid air cells. No obstructing nasopharyngeal lesion is present. Atherosclerotic calcifications are present at the cavernous carotid arteries. Mild atrophy is present. IMPRESSION: 1. Significant improvement in bilateral sinus disease. 2. Minimal residual fluid in the maxillary sinuses bilaterally. 3. Left ethmoid and frontal sinus opacification is not significantly  changed. 4. Right frontal and bilateral sphenoid sinus colossal thickening is significantly improved. Electronically Signed   By: San Morelle M.D.   On: 05/05/2016 10:00    Procedures Procedures (including critical care time)  Medications Ordered in ED Medications  predniSONE (DELTASONE) tablet 80 mg (80 mg Oral Given 05/06/16 2121)     Initial Impression / Assessment and Plan / ED Course  I have reviewed the triage vital signs and the nursing notes.  Pertinent labs & imaging results that were available during my care of the patient were reviewed by me and considered in my medical decision making (see chart for details).  Clinical Course     Patient here for evaluation of thrombocytopenia with outpatient lab today that demonstrate a platelet count of 6000. Lab results were reviewed and care  everywhere. Discussed with Dr. [name] with hematology oncology. Recommends initiating oral prednisone, 100 mg daily and he will see the patient in the office on Monday. Patient has no active bleeding in the emergency department and has no acute complaints. Discussed with patient homecare for thrombocytopenia as well as very close return precautions for any bleeding or injuries.  Final Clinical Impressions(s) / ED Diagnoses   Final diagnoses:  Thrombocytopenia [facility])    New Prescriptions Discharge Medication List as of 05/06/2016  8:59 PM       Quintella Reichert, MD 05/07/16 [phone]

## 2016-05-06 NOTE — ED Triage Notes (Signed)
Pt reports being called today for a low platelet count. Pt had labs done on 05/05/16 and repeated today. Platelet count was at 6 today and was 20 on 05/05/16

## 2016-05-08 ENCOUNTER — Encounter: Payer: Self-pay | Admitting: Hematology and Oncology

## 2016-05-08 ENCOUNTER — Ambulatory Visit (HOSPITAL_BASED_OUTPATIENT_CLINIC_OR_DEPARTMENT_OTHER): Payer: Medicare HMO

## 2016-05-08 ENCOUNTER — Ambulatory Visit (HOSPITAL_BASED_OUTPATIENT_CLINIC_OR_DEPARTMENT_OTHER): Payer: Medicare HMO | Admitting: Hematology and Oncology

## 2016-05-08 ENCOUNTER — Other Ambulatory Visit: Payer: Self-pay | Admitting: Emergency Medicine

## 2016-05-08 VITALS — BP 128/64 | HR 87 | Temp 97.3°F | Resp 19 | Ht 70.0 in | Wt 206.0 lb

## 2016-05-08 DIAGNOSIS — D696 Thrombocytopenia, unspecified: Secondary | ICD-10-CM

## 2016-05-08 DIAGNOSIS — D693 Immune thrombocytopenic purpura: Secondary | ICD-10-CM

## 2016-05-08 LAB — RESPIRATORY ALLERGY PROFILE REGION II ~~LOC~~
Allergen, A. alternata, m6: <0.1 kU/L
Allergen, Comm Silver Birch, t9: <0.1 kU/L
Allergen, D pternoyssinus,d7: <0.1 kU/L
Allergen, P. notatum, m1: <0.1 kU/L
Aspergillus fumigatus, m3: <0.1 kU/L
Box Elder IgE: <0.1 kU/L
Cockroach: <0.1 kU/L
Common Ragweed: <0.1 kU/L
D. farinae: <0.1 kU/L
IGE (IMMUNOGLOBULIN E), SERUM: 49 kU/L (ref <115)
Johnson Grass: <0.1 kU/L
Pecan/Hickory Tree IgE: <0.1 kU/L
Rough Pigweed  IgE: <0.1 kU/L
Sheep Sorrel IgE: <0.1 kU/L
Timothy Grass: <0.1 kU/L

## 2016-05-08 LAB — CBC WITH DIFFERENTIAL/PLATELET
BASO%: 0.1 % (ref 0.0–2.0)
Basophils Absolute: 0 10*3/uL (ref 0.0–0.1)
EOS ABS: 0 10*3/uL (ref 0.0–0.5)
EOS%: 0.2 % (ref 0.0–7.0)
HEMATOCRIT: 35.8 % — AB (ref 38.4–49.9)
HEMOGLOBIN: 11.8 g/dL — AB (ref 13.0–17.1)
LYMPH#: 1.9 10*3/uL (ref 0.9–3.3)
LYMPH%: 18 % (ref 14.0–49.0)
MCH: 28.8 pg (ref 27.2–33.4)
MCHC: 33 g/dL (ref 32.0–36.0)
MCV: 87.3 fL (ref 79.3–98.0)
MONO#: 0.9 10*3/uL (ref 0.1–0.9)
MONO%: 8.6 % (ref 0.0–14.0)
NEUT%: 73.1 % (ref 39.0–75.0)
NEUTROS ABS: 7.8 10*3/uL — AB (ref 1.5–6.5)
NRBC: 0 % (ref 0–0)
RBC: 4.1 10*6/uL — AB (ref 4.20–5.82)
RDW: 13.9 % (ref 11.0–14.6)
WBC: 10.6 10*3/uL — ABNORMAL HIGH (ref 4.0–10.3)

## 2016-05-08 LAB — TECHNOLOGIST REVIEW

## 2016-05-08 MED ORDER — PREDNISONE 20 MG PO TABS: 20.0000 mg | ORAL_TABLET | Freq: Every day | ORAL | 1 refills | Status: DC

## 2016-05-08 NOTE — Progress Notes (Signed)
[place] NOTE  Patient Care Team: Christain Sacramento, MD as PCP - General (Family Medicine)  CHIEF COMPLAINTS/PURPOSE OF CONSULTATION:  ITP  HISTORY OF PRESENTING ILLNESS:  [name] 76 y.o. male is here because of recent diagnosis of severe thrombocytopenia. Recently patient had cough and shortness of breath exertion. He has been diagnosed with pneumonia based on the CT scan performed on 05/05/2016 which showedComplex pulmonary parenchymal pattern of disease. 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 Patient follows with Dr. Melvyn Novas with pulmonology. This scan was reviewed and he was put on antibiotics with amoxicillin. He presented with severe thrombocytopenia with a platelet count that was undetectable. His clinical findings are suspicious right DP and the patient was started on prednisone in the emergency room. He is diabetic and he has increase the dosage of insulin. Today's platelets is still undetectable. He does have petechiae on his wrist and small bruises at the site of blood draws. Otherwise he denies any nose bleeds or any other sites of bleeding. I reviewed her records extensively and collaborated the history with the patient.  MEDICAL HISTORY:  Past Medical History:  Diagnosis Date  . Adenomatous colon polyp    01/2006  . Chronic pansinusitis   . Diabetes mellitus   . Diverticula of colon   . Hyperlipemia    under control now with meds  . Kidney stones    Stones in past  . Sinus drainage    Cough with post nasal drip    SURGICAL HISTORY: Past Surgical History:  Procedure Laterality Date  . COLONOSCOPY    . ESOPHAGOGASTRODUODENOSCOPY    . ESOPHAGOGASTRODUODENOSCOPY  05/19/2011   Procedure:  ESOPHAGOGASTRODUODENOSCOPY (EGD);  Surgeon: Wonda Horner, MD;  Location: [facility];  Service: Endoscopy;  Laterality: N/A;  stat CBC  APC also needed  . HOT HEMOSTASIS  05/19/2011   Procedure: HOT HEMOSTASIS (ARGON PLASMA COAGULATION/BICAP);  Surgeon: Wonda Horner, MD;  Location: [facility];  Service: Endoscopy;  Laterality: N/A;  . spinal cyst      SOCIAL HISTORY: Social History   Social History  . Marital status: Married    Spouse name: N/A  . Number of children: N/A  . Years of education: N/A   Occupational History  . Not on file.   Social History Main Topics  . Smoking status: Former Smoker    Packs/day: 1.00    Years: 42.00    Types: Cigarettes    Start date: 05/02/1959    Quit date: 05/01/2001  . Smokeless tobacco: Never Used  . Alcohol use Yes     Comment: social  . Drug use: No  . Sexual activity: Not on file   Other Topics Concern  . Not on file   Social History Narrative  . No narrative on file    FAMILY HISTORY: Family History  Problem Relation Age of Onset  . Colon cancer Mother   . Heart disease Mother   . Liver cancer Father   . Heart disease Father     ALLERGIES:  has No Known Allergies.  MEDICATIONS:  Current Outpatient Prescriptions  Medication Sig Dispense Refill  . albuterol (PROAIR HFA) 108 (90 Base) MCG/ACT inhaler 2 puffs every 4 hours as needed only  if your can't catch your breath 1 Inhaler 11  .  amoxicillin-clavulanate (AUGMENTIN) 875-125 MG tablet Take 1 tablet by mouth 2 (two) times daily. 20 tablet 0  . aspirin 81 MG tablet Take 81 mg by mouth daily.     Marland Kitchen dextromethorphan-guaiFENesin (MUCINEX DM) 30-600 MG 12hr tablet Take 1 tablet by mouth 2 (two) times daily as needed for cough.    . DOCOSAHEXAENOIC ACID PO Take 1 g by mouth.    . famotidine (PEPCID) 20 MG tablet One at bedtime    . fluticasone (FLONASE) 50 MCG/ACT nasal spray     . Ginkgo Biloba 40 MG TABS Take 150 mg by mouth daily.    . Glucosamine-Chondroit-Vit C-Mn  (GLUCOSAMINE 1500 COMPLEX PO) Take 1,500 mg by mouth 2 (two) times daily.    . Insulin Glargine (TOUJEO SOLOSTAR) 300 UNIT/ML SOPN Inject 45 Units into the skin daily.    Marland Kitchen losartan (COZAAR) 50 MG tablet Take 1 tablet (50 mg total) by mouth daily. 90 tablet 2  . metFORMIN (GLUCOPHAGE) 1000 MG tablet Take 1,000 mg by mouth 2 (two) times daily with a meal.    . Multiple Vitamins-Minerals (MULTIVITAMIN PO) Take by mouth 1 day or 1 dose.    . pantoprazole (PROTONIX) 40 MG tablet Take 1 tablet (40 mg total) by mouth daily. Take 30-60 min before first meal of the day 30 tablet 2  . predniSONE (DELTASONE) 20 MG tablet Take 1 tablet (20 mg total) by mouth daily with breakfast. (Patient taking differently: Take 90 mg by mouth daily with breakfast. ) 150 tablet 1  . Respiratory Therapy Supplies (FLUTTER) DEVI Use as directed 1 each 0  . simvastatin (ZOCOR) 80 MG tablet Take 80 mg by mouth at bedtime.    . Tiotropium Bromide-Olodaterol (STIOLTO RESPIMAT) 2.5-2.5 MCG/ACT AERS Inhale 2 puffs into the lungs daily. 1 Inhaler 0  . UNABLE TO FIND Med Name: [employer] otc daily     No current facility-administered medications for this visit.     REVIEW OF SYSTEMS:   Constitutional: Denies fevers, chills or abnormal night sweats Eyes: Denies blurriness of vision, double vision or watery eyes Ears, nose, mouth, throat, and face: Denies mucositis or sore throat Respiratory: Denies cough, dyspnea or wheezes Cardiovascular: Denies palpitation, chest discomfort or lower extremity swelling Gastrointestinal:  Denies nausea, heartburn or change in bowel habits Skin: Denies abnormal skin rashes Lymphatics: Denies new lymphadenopathy or easy bruising Neurological:Denies numbness, tingling or new weaknesses Behavioral/Psych: Mood is stable, no new changes  All other systems were reviewed with the patient and are negative.  PHYSICAL EXAMINATION: ECOG PERFORMANCE STATUS: 1 - Symptomatic but  completely ambulatory  Vitals:   05/08/16 0953  BP: 128/64  Pulse: 87  Resp: 19  Temp: 97.3 F (36.3 C)   Filed Weights   05/08/16 0953  Weight: 206 lb (93.4 kg)    GENERAL:alert, no distress and comfortable SKIN: skin color, texture, turgor are normal, no rashes or significant lesions EYES: normal, conjunctiva are pink and non-injected, sclera clear OROPHARYNX:no exudate, no erythema and lips, buccal mucosa, and tongue normal  NECK: supple, thyroid normal size, non-tender, without nodularity LYMPH:  no palpable lymphadenopathy in the cervical, axillary or inguinal LUNGS: clear to auscultation and percussion with normal breathing effort HEART: regular rate & rhythm and no murmurs and no lower extremity edema ABDOMEN:abdomen soft, non-tender and normal bowel sounds Musculoskeletal:no cyanosis of digits and no clubbing  PSYCH: alert & oriented x 3 with fluent speech NEURO: no focal motor/sensory deficits LABORATORY DATA:  I have  reviewed the data as listed Lab Results  Component Value Date   WBC 10.6 (H) 05/08/2016   HGB 11.8 (L) 05/08/2016   HCT 35.8 (L) 05/08/2016   MCV 87.3 05/08/2016   PLT <2 (LL) 05/08/2016   Lab Results  Component Value Date   NA 133 (L) 05/05/2016   K 4.6 05/05/2016   CL 96 05/05/2016   CO2 32 05/05/2016    RADIOGRAPHIC STUDIES: I have personally reviewed the radiological reports and agreed with the findings in the report.  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 Currently on prednisone I will decrease it to 90 mg daily. He will continue the same dose until next Monday. If his platelets started to improve then we can continue with the steroid therapy with a slow taper. If his platelets do not improve, then he will need IV immunoglobulin which will be arranged for next Tuesday and Wednesday. Pathogenesis: I discussed with patient extensively the pathogenesis of  immune thrombocytopenia.  Steroid-related side effects:  1. Acid reflux and gastritis: Patient is currently on Protonix and Pepcid I encouraged him to continue with both  2. I discussed with them that his blood sugars would be elevated due to steroids. He is already adjusting his insulin doses.   Bilateral pneumonia: Patient has been discussing with his pulmonologist regarding his complex findings on the CT scan.   All questions were answered. The patient knows to call the clinic with any problems, questions or concerns.    Rulon Eisenmenger, MD 05/08/16

## 2016-05-15 ENCOUNTER — Other Ambulatory Visit: Payer: Self-pay | Admitting: Hematology and Oncology

## 2016-05-15 ENCOUNTER — Other Ambulatory Visit (HOSPITAL_BASED_OUTPATIENT_CLINIC_OR_DEPARTMENT_OTHER): Payer: Medicare HMO

## 2016-05-15 DIAGNOSIS — D693 Immune thrombocytopenic purpura: Secondary | ICD-10-CM | POA: Diagnosis not present

## 2016-05-15 LAB — COMPREHENSIVE METABOLIC PANEL
ALBUMIN: 2.8 g/dL — AB (ref 3.5–5.0)
ALK PHOS: 64 U/L (ref 40–150)
ALT: 10 U/L (ref 0–55)
AST: 8 U/L (ref 5–34)
Anion Gap: 7 mEq/L (ref 3–11)
BUN: 20.6 mg/dL (ref 7.0–26.0)
CALCIUM: 8.7 mg/dL (ref 8.4–10.4)
CHLORIDE: 98 meq/L (ref 98–109)
CO2: 31 mEq/L — ABNORMAL HIGH (ref 22–29)
CREATININE: 0.8 mg/dL (ref 0.7–1.3)
EGFR: 88 mL/min/{1.73_m2} — ABNORMAL LOW (ref >90)
GLUCOSE: 258 mg/dL — AB (ref 70–140)
POTASSIUM: 4.5 meq/L (ref 3.5–5.1)
SODIUM: 136 meq/L (ref 136–145)
Total Bilirubin: 0.39 mg/dL (ref 0.20–1.20)
Total Protein: 6.2 g/dL — ABNORMAL LOW (ref 6.4–8.3)

## 2016-05-15 LAB — CBC WITH DIFFERENTIAL/PLATELET
BASO%: 0.1 % (ref 0.0–2.0)
Basophils Absolute: 0 10*3/uL (ref 0.0–0.1)
EOS%: 1.1 % (ref 0.0–7.0)
Eosinophils Absolute: 0.2 10*3/uL (ref 0.0–0.5)
HEMATOCRIT: 35.8 % — AB (ref 38.4–49.9)
HEMOGLOBIN: 11.5 g/dL — AB (ref 13.0–17.1)
LYMPH#: 1.9 10*3/uL (ref 0.9–3.3)
LYMPH%: 13.9 % — ABNORMAL LOW (ref 14.0–49.0)
MCH: 28.8 pg (ref 27.2–33.4)
MCHC: 32.1 g/dL (ref 32.0–36.0)
MCV: 89.7 fL (ref 79.3–98.0)
MONO#: 1 10*3/uL — ABNORMAL HIGH (ref 0.1–0.9)
MONO%: 7.4 % (ref 0.0–14.0)
NEUT#: 10.4 10*3/uL — ABNORMAL HIGH (ref 1.5–6.5)
NEUT%: 77.5 % — ABNORMAL HIGH (ref 39.0–75.0)
Platelets: 7 10*3/uL — CL (ref 140–400)
RBC: 3.99 10*6/uL — ABNORMAL LOW (ref 4.20–5.82)
RDW: 15.1 % — AB (ref 11.0–14.6)
WBC: 13.5 10*3/uL — AB (ref 4.0–10.3)

## 2016-05-15 NOTE — Progress Notes (Signed)
Pt platelets improved to 7k today. Went out in the lobby to see if pt was still in the building but no response when name was called. Called pt at home. Wife answered the phone. Instructed pt wife that since Dr.Gudena was off today, discussed pt situation and labs from today with Dr.Magrinat. Per Dr.Gudena's note, as long as pt is not bleeding, he is ok to come back to see Dr tomorrow and start ivig then. Pt wife read back results and plan for tomorrow. Pt will call with any concerns with bleeding. Told pt that if pt starts to feel sob, dizzy or passes out, pt needs to go to ED immediately. Wife states that pt had some nose bleeds over the weekend but had resolved today. Pt wife appreciative and will relay the message to pt when he gets home from the cancer center.

## 2016-05-15 NOTE — Assessment & Plan Note (Signed)
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 Currently on prednisone 90 mg daily.  If his platelets do not improve, then he will need IV immunoglobulin which will be arranged for next Tuesday and Wednesday.  Steroid-related side effects:  1. Acid reflux and gastritis: Patient is currently on Protonix and Pepcid I encouraged him to continue with both  2. I discussed with them that his blood sugars would be elevated due to steroids. He is already adjusting his insulin doses.   Bilateral pneumonia: Patient has been discussing with his pulmonologist regarding his complex findings on the CT scan.

## 2016-05-16 ENCOUNTER — Ambulatory Visit: Payer: Medicare HMO

## 2016-05-16 ENCOUNTER — Ambulatory Visit (HOSPITAL_BASED_OUTPATIENT_CLINIC_OR_DEPARTMENT_OTHER): Payer: Medicare HMO | Admitting: Hematology and Oncology

## 2016-05-16 ENCOUNTER — Telehealth: Payer: Self-pay | Admitting: *Deleted

## 2016-05-16 ENCOUNTER — Ambulatory Visit: Payer: Medicare HMO | Admitting: Internal Medicine

## 2016-05-16 ENCOUNTER — Other Ambulatory Visit: Payer: Medicare HMO

## 2016-05-16 DIAGNOSIS — D693 Immune thrombocytopenic purpura: Secondary | ICD-10-CM

## 2016-05-16 MED ORDER — PREDNISONE 20 MG PO TABS: 90.0000 mg | ORAL_TABLET | Freq: Every day | ORAL | Status: DC

## 2016-05-16 NOTE — Progress Notes (Signed)
Patient Care Team: Christain Sacramento, MD as PCP - General (Family Medicine)  DIAGNOSIS:  Encounter Diagnosis  Name Primary?  . 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 Is been on 90 mg daily. He has not had any bleeding symptoms. He has a small sore in the mouth which bled a little bit but has not had any problems since. He continues to have cough with expectoration. He has not seen Dr. Melvyn Novas  Over this past week.Marland Kitchen  REVIEW OF SYSTEMS:   Constitutional: Denies fevers, chills or abnormal weight loss Eyes: Denies blurriness of vision Ears, nose, mouth, throat, and face: Denies mucositis or sore throat Respiratory: Denies cough, dyspnea or wheezes Cardiovascular: Denies palpitation, chest discomfort Gastrointestinal:  Denies nausea, heartburn or change in bowel habits Skin: Denies abnormal skin rashes Lymphatics: Denies new lymphadenopathy or easy bruising Neurological:Denies numbness, tingling or new weaknesses Behavioral/Psych: Mood is stable, no new changes  Extremities: No lower extremity edema  All other systems were reviewed with the patient and are negative.  I have reviewed the past medical history, past surgical history, social history and family history with the patient and they are unchanged from previous note.  ALLERGIES:  has No Known Allergies.  MEDICATIONS:  Current Outpatient Prescriptions  Medication Sig Dispense Refill  . albuterol (PROAIR HFA) 108 (90 Base) MCG/ACT inhaler 2 puffs every 4 hours as needed only  if your can't catch your breath 1 Inhaler 11  . amoxicillin-clavulanate (AUGMENTIN) 875-125 MG tablet Take 1 tablet by mouth 2 (two) times daily. 20 tablet 0  . aspirin 81 MG tablet Take 81 mg by mouth daily.     Marland Kitchen dextromethorphan-guaiFENesin  (MUCINEX DM) 30-600 MG 12hr tablet Take 1 tablet by mouth 2 (two) times daily as needed for cough.    . DOCOSAHEXAENOIC ACID PO Take 1 g by mouth.    . famotidine (PEPCID) 20 MG tablet One at bedtime    . fluticasone (FLONASE) 50 MCG/ACT nasal spray     . Ginkgo Biloba 40 MG TABS Take 150 mg by mouth daily.    . Glucosamine-Chondroit-Vit C-Mn (GLUCOSAMINE 1500 COMPLEX PO) Take 1,500 mg by mouth 2 (two) times daily.    . Insulin Glargine (TOUJEO SOLOSTAR) 300 UNIT/ML SOPN Inject 45 Units into the skin daily.    Marland Kitchen losartan (COZAAR) 50 MG tablet Take 1 tablet (50 mg total) by mouth daily. 90 tablet 2  . metFORMIN (GLUCOPHAGE) 1000 MG tablet Take 1,000 mg by mouth 2 (two) times daily with a meal.    . Multiple Vitamins-Minerals (MULTIVITAMIN PO) Take by mouth 1 day or 1 dose.    . pantoprazole (PROTONIX) 40 MG tablet Take 1 tablet (40 mg total) by mouth daily. Take 30-60 min before first meal of the day 30 tablet 2  . predniSONE (DELTASONE) 20 MG tablet Take 1 tablet (20 mg total) by mouth daily with breakfast. (Patient taking differently: Take 90 mg by mouth daily with breakfast. ) 150 tablet 1  . Respiratory Therapy Supplies (FLUTTER) DEVI Use as directed 1 each 0  . simvastatin (ZOCOR) 80 MG tablet Take 80 mg by mouth at bedtime.    . Tiotropium Bromide-Olodaterol (STIOLTO RESPIMAT) 2.5-2.5 MCG/ACT AERS Inhale 2 puffs into the lungs daily. 1 Inhaler 0  .  UNABLE TO FIND Med Name: [employer] Greens otc daily     No current facility-administered medications for this visit.     PHYSICAL EXAMINATION: ECOG PERFORMANCE STATUS: 1 - Symptomatic but completely ambulatory  Vitals:   05/16/16 1006  BP: (!) 117/54  Pulse: (!) 103  Resp: 19  Temp: 97.6 F (36.4 C)   Filed Weights   05/16/16 1006  Weight: 210 lb 14.4 oz (95.7 kg)    GENERAL:alert, no distress and comfortable SKIN: skin color, texture, turgor are normal, no rashes or significant lesions EYES: normal, Conjunctiva  are pink and non-injected, sclera clear OROPHARYNX:no exudate, no erythema and lips, buccal mucosa, and tongue normal  NECK: supple, thyroid normal size, non-tender, without nodularity LYMPH:  no palpable lymphadenopathy in the cervical, axillary or inguinal LUNGS: clear to auscultation and percussion with normal breathing effort HEART: regular rate & rhythm and no murmurs and no lower extremity edema ABDOMEN:abdomen soft, non-tender and normal bowel sounds MUSCULOSKELETAL:no cyanosis of digits and no clubbing  NEURO: alert & oriented x 3 with fluent speech, no focal motor/sensory deficits EXTREMITIES: No lower extremity edema  LABORATORY DATA:  I have reviewed the data as listed   Chemistry      Component Value Date/Time   NA 136 05/15/2016 0938   K 4.5 05/15/2016 0938   CL 96 05/05/2016 1450   CO2 31 (H) 05/15/2016 0938   BUN 20.6 05/15/2016 0938   CREATININE 0.8 05/15/2016 0938      Component Value Date/Time   CALCIUM 8.7 05/15/2016 0938   ALKPHOS 64 05/15/2016 0938   AST 8 05/15/2016 0938   ALT 10 05/15/2016 0938   BILITOT 0.39 05/15/2016 0938       Lab Results  Component Value Date   WBC 13.5 (H) 05/15/2016   HGB 11.5 (L) 05/15/2016   HCT 35.8 (L) 05/15/2016   MCV 89.7 05/15/2016   PLT 7 (LL) 05/15/2016   NEUTROABS 10.4 (H) 05/15/2016    ASSESSMENT & PLAN:  Acute ITP (HCC) Acute autoimmune thrombocytopenia: Most likely precipitated by his underlying lung infection. Currently on prednisone 90 mg daily.  If his platelets do not improve, then he will need IV immunoglobulin which will be arranged for next Tuesday and Wednesday.  Steroid-related side effects:  1. Acid reflux and gastritis: Patient is currently on Protonix and Pepcid I encouraged him to continue with both  2. I discussed with them that his blood sugars would be elevated due to steroids. He is already adjusting his insulin doses.   Bilateral pneumonia: Patient has been discussing with his  pulmonologist Dr.Wert regarding his complex findings on the CT scan.   patient's platelet count today is 7.  I recommended that he received 2 doses of IVIG  his insurance did not authorize IVIG yet. We will plan to administer tomorrow and threafter.   we discussed the risks and benefits of IVIG including the risk of allergy reactions or infusion reactions.  He will return once a week for platelet count check and follow-up. I spent 25 minutes talking to the patient of which more than half was spent in counseling and coordination of care.  No orders of the defined types were placed in this encounter.  The patient has a good understanding of the overall plan. he agrees with it. he will call with any problems that may develop before the next visit here.   Rulon Eisenmenger, MD 05/16/16

## 2016-05-16 NOTE — Telephone Encounter (Signed)
Per Arbie Cookey, [facility], IVIG has been prior authorized. This RN informed desk nurse and MD. Also, I called the patient and informed him. Patient verbalized understanding. His appointment is for tomorrow at 9:30 am. He stated,"I'll be there unless I can't get out due to the snow."

## 2016-05-17 ENCOUNTER — Ambulatory Visit (HOSPITAL_BASED_OUTPATIENT_CLINIC_OR_DEPARTMENT_OTHER): Payer: Medicare HMO

## 2016-05-17 VITALS — BP 123/62 | HR 101 | Temp 97.1°F | Resp 18

## 2016-05-17 DIAGNOSIS — D693 Immune thrombocytopenic purpura: Secondary | ICD-10-CM | POA: Diagnosis not present

## 2016-05-17 MED ORDER — DIPHENHYDRAMINE HCL 25 MG PO CAPS
ORAL_CAPSULE | ORAL | Status: AC
  Filled 2016-05-17: qty 1

## 2016-05-17 MED ORDER — IMMUNE GLOBULIN (HUMAN) 5 GM/50ML IV SOLN
100.0000 g | Freq: Once | INTRAVENOUS | Status: AC
  Administered 2016-05-17: 10:00:00 95 g via INTRAVENOUS
  Filled 2016-05-17: qty 50

## 2016-05-17 MED ORDER — DEXTROSE 5 % IV SOLN
INTRAVENOUS | Status: DC
Start: 2016-05-17 — End: 2016-05-17
  Administered 2016-05-17: 09:00:00 via INTRAVENOUS

## 2016-05-17 MED ORDER — ACETAMINOPHEN 325 MG PO TABS
ORAL_TABLET | ORAL | Status: AC
  Filled 2016-05-17: qty 2

## 2016-05-17 MED ORDER — SODIUM CHLORIDE 0.9 % IV SOLN: Freq: Once | INTRAVENOUS | Status: DC

## 2016-05-17 MED ORDER — ACETAMINOPHEN 325 MG PO TABS
650.0000 mg | ORAL_TABLET | Freq: Once | ORAL | Status: AC
  Administered 2016-05-17: 650 mg via ORAL

## 2016-05-17 MED ORDER — DIPHENHYDRAMINE HCL 25 MG PO TABS
25.0000 mg | ORAL_TABLET | Freq: Once | ORAL | Status: AC
  Administered 2016-05-17: 25 mg via ORAL
  Filled 2016-05-17: qty 1

## 2016-05-17 NOTE — Patient Instructions (Signed)

## 2016-05-18 ENCOUNTER — Ambulatory Visit (HOSPITAL_BASED_OUTPATIENT_CLINIC_OR_DEPARTMENT_OTHER): Payer: Medicare HMO

## 2016-05-18 ENCOUNTER — Other Ambulatory Visit: Payer: Self-pay | Admitting: Hematology and Oncology

## 2016-05-18 VITALS — BP 124/69 | HR 89 | Temp 98.7°F | Resp 18

## 2016-05-18 DIAGNOSIS — D693 Immune thrombocytopenic purpura: Secondary | ICD-10-CM

## 2016-05-18 MED ORDER — DIPHENHYDRAMINE HCL 25 MG PO CAPS
25.0000 mg | ORAL_CAPSULE | Freq: Once | ORAL | Status: AC
  Administered 2016-05-18: 25 mg via ORAL

## 2016-05-18 MED ORDER — ACETAMINOPHEN 325 MG PO TABS
650.0000 mg | ORAL_TABLET | Freq: Once | ORAL | Status: AC
  Administered 2016-05-18: 650 mg via ORAL

## 2016-05-18 MED ORDER — DIPHENHYDRAMINE HCL 25 MG PO CAPS
ORAL_CAPSULE | ORAL | Status: AC
  Filled 2016-05-18: qty 1

## 2016-05-18 MED ORDER — HEPARIN SOD (PORK) LOCK FLUSH 100 UNIT/ML IV SOLN
500.0000 [IU] | Freq: Once | INTRAVENOUS | Status: DC | PRN
  Filled 2016-05-18: qty 5

## 2016-05-18 MED ORDER — HEPARIN SOD (PORK) LOCK FLUSH 100 UNIT/ML IV SOLN
250.0000 [IU] | Freq: Once | INTRAVENOUS | Status: AC | PRN
  Filled 2016-05-18: qty 5

## 2016-05-18 MED ORDER — ACETAMINOPHEN 325 MG PO TABS
ORAL_TABLET | ORAL | Status: AC
  Filled 2016-05-18: qty 2

## 2016-05-18 MED ORDER — IMMUNE GLOBULIN (HUMAN) 20 GM/200ML IV SOLN
1.0500 g/kg | Freq: Once | INTRAVENOUS | Status: AC
  Administered 2016-05-18: 100 g via INTRAVENOUS
  Filled 2016-05-18: qty 1000

## 2016-05-18 MED ORDER — SODIUM CHLORIDE 0.9% FLUSH
10.0000 mL | INTRAVENOUS | Status: DC | PRN
  Filled 2016-05-18: qty 10

## 2016-05-18 NOTE — Progress Notes (Signed)
Entered note in error.

## 2016-05-18 NOTE — Patient Instructions (Signed)

## 2016-05-22 NOTE — Assessment & Plan Note (Addendum)
Most likely precipitated by his underlying lung infection. Currently on prednisone 90 mg daily.  If his platelets do not improve,then he will need IV immunoglobulin which will be arranged for next Tuesday andWednesday.  Steroid-related side effects:  1. Acid reflux and gastritis: Patient is currently on Protonix and Pepcid I encouraged him to continue with both  2. I discussed with them that his blood sugars would be elevated due to steroids. He is already adjusting his insulin doses.   Bilateral pneumonia: Patient has been discussing with his pulmonologist Dr.Wert regarding his complex findings on the CT scan.   S/P IVIG

## 2016-05-23 ENCOUNTER — Encounter: Payer: Self-pay | Admitting: Internal Medicine

## 2016-05-23 ENCOUNTER — Ambulatory Visit (HOSPITAL_BASED_OUTPATIENT_CLINIC_OR_DEPARTMENT_OTHER): Payer: Medicare HMO | Admitting: Hematology and Oncology

## 2016-05-23 ENCOUNTER — Other Ambulatory Visit (HOSPITAL_BASED_OUTPATIENT_CLINIC_OR_DEPARTMENT_OTHER): Payer: Medicare HMO

## 2016-05-23 ENCOUNTER — Encounter: Payer: Self-pay | Admitting: Hematology and Oncology

## 2016-05-23 ENCOUNTER — Ambulatory Visit (INDEPENDENT_AMBULATORY_CARE_PROVIDER_SITE_OTHER): Payer: Medicare HMO | Admitting: Internal Medicine

## 2016-05-23 ENCOUNTER — Ambulatory Visit: Payer: Medicare HMO

## 2016-05-23 VITALS — BP 146/72 | HR 98 | Temp 97.7°F | Resp 18 | Wt 213.5 lb

## 2016-05-23 VITALS — BP 128/82 | HR 90 | Ht 70.0 in | Wt 215.0 lb

## 2016-05-23 DIAGNOSIS — J471 Bronchiectasis with (acute) exacerbation: Secondary | ICD-10-CM

## 2016-05-23 DIAGNOSIS — J449 Chronic obstructive pulmonary disease, unspecified: Secondary | ICD-10-CM

## 2016-05-23 DIAGNOSIS — D693 Immune thrombocytopenic purpura: Secondary | ICD-10-CM | POA: Diagnosis not present

## 2016-05-23 DIAGNOSIS — J479 Bronchiectasis, uncomplicated: Secondary | ICD-10-CM | POA: Insufficient documentation

## 2016-05-23 DIAGNOSIS — J189 Pneumonia, unspecified organism: Secondary | ICD-10-CM

## 2016-05-23 LAB — CBC WITH DIFFERENTIAL/PLATELET
BASO%: 0 % (ref 0.0–2.0)
BASOS ABS: 0 10*3/uL (ref 0.0–0.1)
EOS ABS: 0 10*3/uL (ref 0.0–0.5)
EOS%: 0.1 % (ref 0.0–7.0)
HCT: 31.7 % — ABNORMAL LOW (ref 38.4–49.9)
HGB: 10.3 g/dL — ABNORMAL LOW (ref 13.0–17.1)
LYMPH#: 0.6 10*3/uL — AB (ref 0.9–3.3)
LYMPH%: 5.5 % — AB (ref 14.0–49.0)
MCH: 28.8 pg (ref 27.2–33.4)
MCHC: 32.5 g/dL (ref 32.0–36.0)
MCV: 88.5 fL (ref 79.3–98.0)
MONO#: 0.2 10*3/uL (ref 0.1–0.9)
MONO%: 2.1 % (ref 0.0–14.0)
NEUT#: 10.7 10*3/uL — ABNORMAL HIGH (ref 1.5–6.5)
NEUT%: 92.3 % — AB (ref 39.0–75.0)
RBC: 3.58 10*6/uL — AB (ref 4.20–5.82)
RDW: 15 % — ABNORMAL HIGH (ref 11.0–14.6)
WBC: 11.6 10*3/uL — ABNORMAL HIGH (ref 4.0–10.3)

## 2016-05-23 NOTE — Progress Notes (Signed)
Subjective:   Patient ID: [name], male    DOB: [birth year]  MRN: [id number]    Brief patient profile:  29 yowm quit smoking around 2003  With "smoker's cough"  And some coughing assoc with  seasonal rhinitis spring and fall Which both improved  but since around 2010 worse again with daily cough dx as sinus dz and planning sinus surgery 02/21/12  and sent by [facility] ENT for pre-op pulmonary eval 02/16/2012 with GOLD III COPD criteria then     History of Present Illness  02/16/2012 1st pulmonary eval cc daily cough min prod mucus x sev years with rattling quality  and no sign sob, cough is better while sleeping does not flare first thing in am  Able to walk a mile a day at 2.7 -2.8 at grade 2 -3 s difficulty.  rec Stop fish oil and lisinopril dulera 100 Take 2 puffs first thing in am and then another 2 puffs about 12 hours later.  For cough mucinex dm 1-2 every 12 hours as needed  GERD diet   03/26/2012 f/u ov/[name] cc variable cough mostly during the day, rattles but no production, happens sev times at most typically p eating. Min sob, overall much better since sinus surgery and off acei Stop the diovan when you get your cozaar (losartan) 50 mg one daily  Try turdorza one twice daily x 2 weeks to see if it helps your breathing or coughing and if not stop it, if you like it refill it If continue to cough Try prilosec 20mg   Take 30-60 min before first meal of the day and Pepcid 20 mg one bedtime until cough is completely gone   GERD diet    06/26/2012 f/u ov/[name] cc daily cough x 4 years worse p bfast and assoc with sense of pnds no better on flonase, better on allegra d but stopped working x one year or so rec Try prilosec 20mg   Take 30-60 min before first meal of the day and Pepcid 20 mg one bedtime  Dulera 100 Take 2 puffs first thing in am and then another 2 puffs about 12 hours later.  Dymista twice daily each nostil If still draining try adding chlortrimeton 4 mg every 6 hours  (may cause drowsiness) Prednisone 10 mg take  4 each am x 2 days,   2 each am x 2 days,  1 each am x2days and stop         07/10/2012 ov / Pulmonary f/u 100% better on above regimen, no sob, no cough rec Try off dulera to see if any symptoms flare but no other changes for the next month Please schedule a follow up office visit in 4 weeks, sooner if needed > did not return      02/02/2016  Pulmonary consultation/ new sob  Chief Complaint  Patient presents with  . Pulmonary Consult    Referred by Dr Kathryne Eriksson. Pt seen here last in 2014. He c/o increased SOB for the past month. He states he can not walk longer than 5 min on level ground due to SOB. He also c/o "deep cough", hard to produce sputum and he is unsure of color.   Baseline coughing  Since last ov / no prednisone/ no inhalers no gerd rx only flonase  gradually worse cough x 3y then breathing worse x one month Cough is deeper / more congested but not productive still mostly daytime and worse p meals  Was walking sev miles a day until  one month prior to OV  Now sob room to room  Already on bevespi and no better after zpak / prednisone  rec For cough > mucinex dm up to 1200 mg every hours and use flutter valve  symbicort 80 Take 2 puffs first thing in am and then another 2 puffs about 12 hours later.  Pantoprazole (protonix) 40 mg   Take  30-60 min before first meal of the day and Pepcid (famotidine)  20 mg one @  bedtime until return to office - this is the best way to tell whether stomach acid is contributing to your problem.   GERD diet  Prednisone 10 mg take  4 each am x 2 days,   2 each am x 2 days,  1 each am x 2 days and stop  Augmentin 875 mg take one pill twice daily  X 10 days - take at breakfast and supper with large glass of water.  It would help reduce the usual side effects (diarrhea and yeast infections) if you ate cultured yogurt at lunch.         02/17/2016  f/u ov/[name] re: GOLD II copd/ symb 80 2bid and no  need for saba  Chief Complaint  Patient presents with  . Follow-up    Breathing has improved some. He denies any new co's. Still coughing some.    back to walking up to a mile and a half  / flutter valve helping with am cough  rec Please see patient coordinator before you leave today  to schedule sinus ct  > pos > referred to ENT  Change to symbicort 160 Take 2 puffs first thing in am and then another 2 puffs about 12 hours later.     03/02/16 seen by [facility]/ 2 weeks cipro > drainage resolved      04/04/2016  f/u ov/[name] re: GOLD III on symb 160 2bid  Chief Complaint  Patient presents with  . Follow-up    for PFT results   MMRC2 = can't walk a nl pace on a flat grade s sob but does fine slow and flat eg can walk up to a quarter mile and stops about half way  Could not tell symbicort was better than advair Worse symptoms at hs of cough and wheeze and sob rec Plan A = Automatic =  Stiolto 2 pffs each am - fill the prescription if you feel it helps and insurance will pay, otherwise go back to the advair  Plan B = Backup Only use your albuterol (Ventolin or Proair)as a rescue medication   Please schedule a follow up office visit in 6 weeks, call sooner if needed with all active meds in hand    05/03/2016 acute extended ov/[name] re: cough x 3 years/ worse sob x sev months/ did not bring meds / easily confused with details  Chief Complaint  Patient presents with  . Acute Visit    Pt states he has SOB during physical exertion that resolves after resting, lots of wheezing, cough with mucus production that is swallowed. Inhalers prescribed last visit has had little effect on pt status   pt feels cough is better but has extremely congested sounding honking upper airway cough during interview Not sure what color his mucus is  Cough is worse after meals  Wheeze and sob worse when lie down  Did not follow gerd plan / no h2hs and still using mint products  rec Plan A = Automatic =Stiolto 2  pffs each  am  Plan B = Backup Only use your albuterol as a rescue medication to be used if you can't catch your breath Prednisone 10 mg take  4 each am x 2 days,   2 each am x 2 days,  1 each am x 2 days and stop  For cough mucinex dm 1200 mg every 12 hours and use the flutter valve as much as you possibly can  Please see patient coordinator before you leave today  to schedule sinus ct and  High resolution > bronchiectasis      05/23/2016  f/u ov/[name] re: GOLD III COPD/ bronchiectasis no change doe on stiolto/ never using saba now  Chief Complaint  Patient presents with  . Follow-up    3 wk f/u. pt. is feeling about the same. Still having SOB.   cough is some better but still worse first thing in am and no trouble with meals/ never cough anything up all  Even with use of flutter  ? What activity does your breathing interfere with or hold you back from doing like you want?  A : walks 50 yards before doe x and 100 yards  Out of breath but 3 months prior to OV  Could do 3 miles  Does treadmill daily x 15 min x 2.0 mph x elevation s stopping  Some nose bleeds from ITP dx 05/03/16 and now on pred 80 mg daily with no change in cough or sob   No obvious day to day or daytime variability or assoc excess/ purulent sputum or mucus plugs or hemoptysis or cp or chest tightness, subjective wheeze or overt  hb symptoms. No unusual exp hx or h/o childhood pna/ asthma or knowledge of premature birth.  Sleeping ok without nocturnal  or early am exacerbation  of respiratory  c/o's or need for noct saba. Also denies any obvious fluctuation of symptoms with weather or environmental changes or other aggravating or alleviating factors except as outlined above   Current Medications, Allergies, Complete Past Medical History, Past Surgical History, Family History, and Social History were reviewed in [employer] record.  ROS  The following are not active complaints unless bolded sore throat,  dysphagia, dental problems, itching, sneezing,  nasal congestion or excess/ purulent secretions, ear ache,   fever, chills, sweats, unintended wt loss, classically pleuritic or exertional cp,  orthopnea pnd or leg swelling, presyncope, palpitations, abdominal pain, anorexia, nausea, vomiting, diarrhea  or change in bowel or bladder habits, change in stools or urine, dysuria,hematuria,  rash, arthralgias, visual complaints, headache, numbness, weakness or ataxia or problems with walking or coordination,  change in mood/affect or memory.            Objective:   Physical Exam  amb wm  nad / vital signs reviewed - - Note on arrival 02 sats  95% on RA    Wt  235 06/26/2012 > 07/10/2012  237 > 02/02/2016 202 > 02/17/2016 204 > 04/04/2016 205 > 05/23/2016     02/16/12 229 lb (103.874 kg)  05/19/11 225 lb (102.059 kg)  05/19/11 225 lb (102.059 kg)    HEENT mild turbinate edema.  Top dentures/ Oropharynx no thrush or excess pnd or cobblestoning.  No JVD or cervical adenopathy. Mild accessory muscle hypertrophy. Trachea midline, nl thryroid. Chest was hyperinflated by percussion with diminished breath sounds and lots of transmitted upper airway noise and  moderate increased exp time with  Bilateral late mid/late  exp Rhonchi with cough on forced exp  Hoover sign positive at mid inspiration. Regular rate and rhythm without murmur gallop or rub or increase P2 or edema.  Abd: no hsm, nl excursion. Ext warm without cyanosis or clubbing.          I personally reviewed images and agree with radiology impression as follows:  CT HRCT Chest  05/05/16 1. Complex pulmonary parenchymal pattern of disease. 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 There is also  extensive subpleural reticulation throughout both lungs, which appears out of proportion to the expected findings from atypical mycobacterial infection, and may indicate an underlying interstitial lung disease such as nonspecific interstitial pneumonia (NSIP) or usual interstitial pneumonia (UIP). No frank honeycombing. A follow-up high-resolution chest CT in 12 months is recommended to assess temporal pattern stability. 2. Small to moderate dependent bilateral pleural effusions, slightly greater on the left. 3. Mild emphysema with diffuse bronchial wall thickening, suggesting COPD. 4. Trace pericardial effusion/thickening. 5. Aortic atherosclerosis. left main and 3 vessel coronary atherosclerosis.       Labs   reviewed:      Chemistry      Component Value Date/Time   NA 136 05/15/2016 0938   K 4.5 05/15/2016 0938   CL 96 05/05/2016 1450   CO2 31 (H) 05/15/2016 0938   BUN 20.6 05/15/2016 0938   CREATININE 0.8 05/15/2016 0938      Component Value Date/Time   CALCIUM 8.7 05/15/2016 0938   ALKPHOS 64 05/15/2016 0938   AST 8 05/15/2016 0938   ALT 10 05/15/2016 0938   BILITOT 0.39 05/15/2016 0938        Lab Results  Component Value Date   WBC 13.5 (H) 05/15/2016   HGB 11.5 (L) 05/15/2016   HCT 35.8 (L) 05/15/2016   MCV 89.7 05/15/2016   PLT 7 (LL) 05/15/2016       Lab Results  Component Value Date   TSH 1.64 05/05/2016     Lab Results  Component Value Date   PROBNP 17.0 05/05/2016       Lab Results  Component Value Date   ESRSEDRATE 60 (H) 02/17/2016           Assessment & Plan:

## 2016-05-23 NOTE — Progress Notes (Signed)
Patient Care Team: Christain Sacramento, MD as PCP - General (Family Medicine)  DIAGNOSIS:  Encounter Diagnosis  Name Primary?  . 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 He received IVIG treatment last week and is here today to review his blood work. He complains of intermittent bruises on his arm and occasional nosebleeds. No other significant bleeding issues. He also complains of leg swelling bilaterally. He has Lasix and is planning to take it. He tolerated IVIG treatment fairly well.  REVIEW OF SYSTEMS:   Constitutional: Denies fevers, chills or abnormal weight loss Eyes: Denies blurriness of vision Ears, nose, mouth, throat, and face: Denies mucositis or sore throat Respiratory: Denies cough, dyspnea or wheezes Cardiovascular: Denies palpitation, chest discomfort Gastrointestinal:  Denies nausea, heartburn or change in bowel habits Skin: Denies abnormal skin rashes Lymphatics: Denies new lymphadenopathy or easy bruising Neurological:Denies numbness, tingling or new weaknesses Behavioral/Psych: Mood is stable, no new changes  Extremities: No lower extremity edema, occasional bruises All other systems were reviewed with the patient and are negative.  I have reviewed the past medical history, past surgical history, social history and family history with the patient and they are unchanged from previous note.  ALLERGIES:  has No Known Allergies.  MEDICATIONS:  Current Outpatient Prescriptions  Medication Sig Dispense Refill  . albuterol (PROAIR HFA) 108 (90 Base) MCG/ACT inhaler 2 puffs every 4 hours as needed only  if your can't catch your breath 1 Inhaler 11  . dextromethorphan-guaiFENesin (MUCINEX DM) 30-600 MG 12hr tablet Take 1 tablet by mouth 2 (two) times daily as needed for cough.    . DOCOSAHEXAENOIC ACID PO Take 1 g by  mouth.    . famotidine (PEPCID) 20 MG tablet One at bedtime    . Ginkgo Biloba 40 MG TABS Take 150 mg by mouth daily.    . Glucosamine-Chondroit-Vit C-Mn (GLUCOSAMINE 1500 COMPLEX PO) Take 1,500 mg by mouth 2 (two) times daily.    . Insulin Glargine (TOUJEO SOLOSTAR) 300 UNIT/ML SOPN Inject 45 Units into the skin daily.    Marland Kitchen losartan (COZAAR) 50 MG tablet Take 1 tablet (50 mg total) by mouth daily. 90 tablet 2  . metFORMIN (GLUCOPHAGE) 1000 MG tablet Take 1,000 mg by mouth 2 (two) times daily with a meal.    . Multiple Vitamins-Minerals (MULTIVITAMIN PO) Take by mouth 1 day or 1 dose.    . pantoprazole (PROTONIX) 40 MG tablet Take 1 tablet (40 mg total) by mouth daily. Take 30-60 min before first meal of the day 30 tablet 2  . predniSONE (DELTASONE) 20 MG tablet Take 4.5 tablets (90 mg total) by mouth daily with breakfast.    . Respiratory Therapy Supplies (FLUTTER) DEVI Use as directed 1 each 0  . simvastatin (ZOCOR) 80 MG tablet Take 80 mg by mouth at bedtime.    . Tiotropium Bromide-Olodaterol (STIOLTO RESPIMAT) 2.5-2.5 MCG/ACT AERS Inhale 2 puffs into the lungs daily. 1 Inhaler 0  . UNABLE TO FIND Med Name: [employer] Greens otc daily     No current facility-administered medications for this visit.     PHYSICAL EXAMINATION: ECOG PERFORMANCE STATUS: 1 - Symptomatic but completely ambulatory  Vitals:   05/23/16 1122  BP: (!) 146/72  Pulse: 98  Resp: 18  Temp: 97.7 F (36.5 C)   Filed Weights   05/23/16 1122  Weight: 213  lb 8 oz (96.8 kg)    GENERAL:alert, no distress and comfortable SKIN: skin color, texture, turgor are normal, no rashes or significant lesions EYES: normal, Conjunctiva are pink and non-injected, sclera clear OROPHARYNX:no exudate, no erythema and lips, buccal mucosa, and tongue normal  NECK: supple, thyroid normal size, non-tender, without nodularity LYMPH:  no palpable lymphadenopathy in the cervical, axillary or inguinal LUNGS: clear to  auscultation and percussion with normal breathing effort HEART: regular rate & rhythm and no murmurs  ABDOMEN:abdomen soft, non-tender and normal bowel sounds MUSCULOSKELETAL:no cyanosis of digits and no clubbing  NEURO: alert & oriented x 3 with fluent speech, no focal motor/sensory deficits EXTREMITIES: 2+lower extremity edema  LABORATORY DATA:  I have reviewed the data as listed   Chemistry      Component Value Date/Time   NA 136 05/15/2016 0938   K 4.5 05/15/2016 0938   CL 96 05/05/2016 1450   CO2 31 (H) 05/15/2016 0938   BUN 20.6 05/15/2016 0938   CREATININE 0.8 05/15/2016 0938      Component Value Date/Time   CALCIUM 8.7 05/15/2016 0938   ALKPHOS 64 05/15/2016 0938   AST 8 05/15/2016 0938   ALT 10 05/15/2016 0938   BILITOT 0.39 05/15/2016 0938       Lab Results  Component Value Date   WBC 11.6 (H) 05/23/2016   HGB 10.3 (L) 05/23/2016   HCT 31.7 (L) 05/23/2016   MCV 88.5 05/23/2016   PLT <2 (LL) 05/23/2016   NEUTROABS 10.7 (H) 05/23/2016    ASSESSMENT & PLAN:  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 Currently on prednisone 90 mg daily.  Steroid-related side effects:  1. Acid reflux and gastritis: Patient is currently on Protonix and Pepcid I encouraged him to continue with both  2. I discussed with them that his blood sugars would be elevated due to steroids. He is already adjusting his insulin doses.   Bilateral pneumonia: Patient has been discussing with his pulmonologist Dr.Wert regarding his complex findings on the CT scan.  Patient saw Dr. Melvyn Novas today.  S/P IVIG Today's platelets are less than 2000. I discussed with them that it is possible that he may still respond to IVIG treatment. However I'm concerned that if he does not respond to IVIG patient will need Rituxan treatment.   we will see him back in one week and plan to give him IV Rituxan weekly 4. I discussed with him the risks and benefits of Rituxan including  the long-term risks of chronic infections as well as short-term risk of infusion reactions. He will need hepatitis B and C testing which will be obtained today.  Leg swelling: Encouraged him to take Lasix and elevate his legs.  I will request prior authorization for [place] to use rituximab in this steroid refractory acute ITP situation. Return to clinic in one week to recheck his blood counts.  I spent 25 minutes talking to the patient of which more than half was spent in counseling and coordination of care.  Orders Placed This Encounter  Procedures  . Hepatitis C antibody    Standing Status:   Future    Standing Expiration Date:   05/23/2017  . Hepatitis B surface antigen    Standing Status:   Future    Standing Expiration Date:   05/23/2017  . PHYSICIAN COMMUNICATION ORDER    Hepatitis B Virus screening with HBsAg and anti-HBc recommended prior to treatment with rituximab (Rituxan), ofatumumab (Arzerra) or obinutuzumab Dyann Kief).  The patient has a good understanding of the overall plan. he agrees with it. he will call with any problems that may develop before the next visit here.   Rulon Eisenmenger, MD 05/23/16

## 2016-05-23 NOTE — Assessment & Plan Note (Signed)
-   PFTs 03/26/2012 FEV1  1.36 (45%) Ratio 55 and no better p B2, DLCO 65% - Spirometry 01/28/16   FEV1 0.82 (27%)  Ratio 60% . 02/02/2016   changed bevespi to symbicort 80  - 02/17/2016  After extensive coaching HFA effectiveness =    90% > try symbiocrt 160 2bid  - PFT's  04/04/2016  FEV1 1.05 (32 % ) ratio 61  p 3  % improvement from saba p symb 160  prior to study with DLCO  48/49  % corrects to 93  % for alv volume   - 04/04/2016    try stiolto  - Spirometry 05/23/2016  FEV1 1.30  (42%)  Ratio 61  On stiolto 2 each am  - 05/23/2016  Walked RA x 3 laps @ 185 ft each stopped due to  Sob/ desats to 86%   - The proper method of use, as well as anticipated side effects, of a metered-dose inhaler are discussed and demonstrated to the patient. 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 can be treated but not cured, used the escalator analogy applied to airways in chest and sinuses to explain the problem   Desats with rapid walking can be approached with  1) walk slower 2) wear 02  3) participate in rehab   He chose the 1st option for now but I asked him to keep an open mind about the other two  Since can't tell any change on stiolto and is 140/month rec one more week and try off  Formulary restrictions will be an ongoing challenge for the forseable future and I would be happy to pick an alternative if the pt will first  provide me a list of them but pt  will need to return here for training for any new device that is required eg dpi vs hfa vs respimat.    In meantime we can always provide samples so the patient never runs out of any needed  respiratory medications.   Each maintenance medication was reviewed in detail including most importantly the difference between maintenance and as needed and under what circumstances the prns are to be used.  Please see AVS for specific  Instructions which are unique to this visit and I personally typed out  which were reviewed in detail in writing with the patient and a copy provided.

## 2016-05-23 NOTE — Assessment & Plan Note (Signed)
See CT chest 05/05/16   Best options for now are prn mucienex dm/ rx gerd/ use flutter and add VEST option later if not satisfied

## 2016-05-23 NOTE — Patient Instructions (Addendum)
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  - The less you use it, the better it will work when you need it. - Ok to use up to 2 puffs  every 4 hours if you must but call for immediate appointment if use goes up over your usual need - Don't leave home without it !!  (think of it like the spare tire for your car)    To get the most out of exercise, you need to be continuously aware that you are short of breath, but never out of breath, for 30 minutes daily. As you improve, it will actually be easier for you to do the same amount of exercise  in  30 minutes so always push to the level where you are short of breath.    Please schedule a follow up visit in 3 months but call sooner if needed

## 2016-05-24 ENCOUNTER — Telehealth: Payer: Self-pay

## 2016-05-24 ENCOUNTER — Telehealth: Payer: Self-pay | Admitting: Medical Oncology

## 2016-05-24 ENCOUNTER — Encounter: Payer: Self-pay | Admitting: Hematology and Oncology

## 2016-05-24 ENCOUNTER — Other Ambulatory Visit: Payer: Self-pay | Admitting: Hematology and Oncology

## 2016-05-24 LAB — HEPATITIS C ANTIBODY: HEP C VIRUS AB: 0.1 {s_co_ratio} (ref 0.0–0.9)

## 2016-05-24 LAB — HEPATITIS B SURFACE ANTIGEN: HEP B S AG: NEGATIVE

## 2016-05-24 MED ORDER — AMINOCAPROIC ACID 500 MG PO TABS: 1000.0000 mg | ORAL_TABLET | Freq: Three times a day (TID) | ORAL | 1 refills | Status: DC

## 2016-05-24 NOTE — Progress Notes (Signed)
PA approval for Amicar 04/29/16-04/30/17. Referral number [id number].

## 2016-05-24 NOTE — Telephone Encounter (Signed)
Pt calling and asking for something for his nosebleeds.( ongoing a couple days).  The blood is "seeping" out his nostrils , first one then the other.  The bleeding has "not increase or decreased"  since yesterday when he saw [place]. He indicated Dr [name] would order something for his nosebleeds.Uses [employer]

## 2016-05-24 NOTE — Telephone Encounter (Signed)
Called pt to let him know that AMICAR was ordered for his nosebleeds. Gave pt instructions on how to take the pill. 1000mg  (2tabs) every 8hrs as needed for nose bleeds. Pt advised to call if medication doesn't help in the next 24-48hrs. Also advised for pt to go to the emergency room if pt starts to notice heavy bleeding or bleeding anywhere else. Verbalized understanding and will pick up medication today at [facility].

## 2016-05-24 NOTE — Progress Notes (Signed)
Received call from May RN PA needed for Amicar. [employer] and completed PA over the phone. Spoke with Daaimah whom started the authorization and also had to speak with the pharmacist(Tracy). Olivia Mackie approved PA over the phone and [name] sent approval via fax.  Called [facility] on [address] and provided approval to Sheridan in pharmacy. Raquel Sarna states she will contact the patient to inform him.  Called May RN to advise her as well.

## 2016-05-24 NOTE — Telephone Encounter (Signed)
Pt called states that he called his pharmacy to see if his amicar was ready for pick up but was told that it wasn't approved by his insurance and that it needs prior auth first. Will reach out to manage care and will get back with pt today to let him know of approval status. Pt states that he is still having nosebleeds and wants to know if there was another medication that Dr.Gudena wants to prescribe at this time. Told pt that we will wait until we hear from prior auth and will discuss with Dr.Gudena for an alternative. Pt verbalized understanding and will wait for phone call from nurse.

## 2016-05-24 NOTE — Progress Notes (Signed)
Patient is complaining of nosebleeds. I sent a prescription for Amicar 1 g by mouth every 8 hours when necessary

## 2016-05-25 NOTE — Telephone Encounter (Signed)
Opened in error today 1/25

## 2016-05-29 ENCOUNTER — Other Ambulatory Visit: Payer: Self-pay | Admitting: Emergency Medicine

## 2016-05-29 MED ORDER — PREDNISONE 20 MG PO TABS: 90.0000 mg | ORAL_TABLET | Freq: Every day | ORAL | 1 refills | Status: DC

## 2016-05-29 NOTE — Assessment & Plan Note (Signed)
Most likely precipitated by his underlying lung infection. Currently on prednisone 90 mg daily.  Steroid-related side effects:  1. Acid reflux and gastritis: Patient is currently on Protonix and Pepcid I encouraged him to continue with both  2. I discussed with them that his blood sugars would be elevated due to steroids. He is already adjusting his insulin doses.   Bilateral pneumonia: Patient has been discussing with his pulmonologist Dr.Wert regarding his complex findings on the CT scan.  Patient saw Dr. Melvyn Novas today.  S/P IVIG Today's platelets are less than 2000.  Leg swelling: Encouraged him to take Lasix and elevate his legs.

## 2016-05-30 ENCOUNTER — Other Ambulatory Visit (HOSPITAL_BASED_OUTPATIENT_CLINIC_OR_DEPARTMENT_OTHER): Payer: Medicare HMO

## 2016-05-30 ENCOUNTER — Other Ambulatory Visit (HOSPITAL_COMMUNITY)
Admission: RE | Admit: 2016-05-30 | Discharge: 2016-05-30 | Disposition: A | Discharge status: Home / Self Care | Payer: Medicare HMO | Source: Ambulatory Visit | Attending: Hematology and Oncology | Admitting: Hematology and Oncology

## 2016-05-30 ENCOUNTER — Ambulatory Visit (HOSPITAL_BASED_OUTPATIENT_CLINIC_OR_DEPARTMENT_OTHER): Payer: Medicare HMO | Admitting: Hematology and Oncology

## 2016-05-30 ENCOUNTER — Encounter: Payer: Self-pay | Admitting: Hematology and Oncology

## 2016-05-30 ENCOUNTER — Ambulatory Visit (HOSPITAL_BASED_OUTPATIENT_CLINIC_OR_DEPARTMENT_OTHER): Payer: Medicare HMO

## 2016-05-30 ENCOUNTER — Telehealth: Payer: Self-pay | Admitting: *Deleted

## 2016-05-30 VITALS — BP 134/77 | HR 10 | Temp 98.1°F | Resp 16

## 2016-05-30 DIAGNOSIS — Z5112 Encounter for antineoplastic immunotherapy: Secondary | ICD-10-CM | POA: Diagnosis not present

## 2016-05-30 DIAGNOSIS — J189 Pneumonia, unspecified organism: Secondary | ICD-10-CM | POA: Diagnosis not present

## 2016-05-30 DIAGNOSIS — D649 Anemia, unspecified: Secondary | ICD-10-CM | POA: Insufficient documentation

## 2016-05-30 DIAGNOSIS — D693 Immune thrombocytopenic purpura: Secondary | ICD-10-CM | POA: Diagnosis not present

## 2016-05-30 LAB — CBC WITH DIFFERENTIAL/PLATELET
BASO%: 0.1 % (ref 0.0–2.0)
BASOS ABS: 0 10*3/uL (ref 0.0–0.1)
EOS ABS: 0.1 10*3/uL (ref 0.0–0.5)
EOS%: 0.7 % (ref 0.0–7.0)
HEMATOCRIT: 30.2 % — AB (ref 38.4–49.9)
HEMOGLOBIN: 9.7 g/dL — AB (ref 13.0–17.1)
LYMPH#: 2.3 10*3/uL (ref 0.9–3.3)
LYMPH%: 20.4 % (ref 14.0–49.0)
MCH: 29.1 pg (ref 27.2–33.4)
MCHC: 32.1 g/dL (ref 32.0–36.0)
MCV: 90.7 fL (ref 79.3–98.0)
MONO#: 0.8 10*3/uL (ref 0.1–0.9)
MONO%: 7.5 % (ref 0.0–14.0)
NEUT#: 8 10*3/uL — ABNORMAL HIGH (ref 1.5–6.5)
NEUT%: 71.3 % (ref 39.0–75.0)
RBC: 3.33 10*6/uL — ABNORMAL LOW (ref 4.20–5.82)
RDW: 16 % — ABNORMAL HIGH (ref 11.0–14.6)
WBC: 11.2 10*3/uL — ABNORMAL HIGH (ref 4.0–10.3)
nRBC: 0 % (ref 0–0)

## 2016-05-30 LAB — BONE MARROW EXAM

## 2016-05-30 MED ORDER — ACETAMINOPHEN 325 MG PO TABS
650.0000 mg | ORAL_TABLET | Freq: Once | ORAL | Status: AC
  Administered 2016-05-30: 650 mg via ORAL

## 2016-05-30 MED ORDER — SODIUM CHLORIDE 0.9 % IV SOLN
Freq: Once | INTRAVENOUS | Status: AC
  Administered 2016-05-30: 10:00:00 via INTRAVENOUS

## 2016-05-30 MED ORDER — DIPHENHYDRAMINE HCL 25 MG PO CAPS
50.0000 mg | ORAL_CAPSULE | Freq: Once | ORAL | Status: AC
Start: 2016-05-30 — End: 2016-05-30
  Administered 2016-05-30: 50 mg via ORAL

## 2016-05-30 MED ORDER — RITUXIMAB CHEMO INJECTION 500 MG/50ML
375.0000 mg/m2 | Freq: Once | INTRAVENOUS | Status: AC
  Administered 2016-05-30: 800 mg via INTRAVENOUS
  Filled 2016-05-30: qty 30

## 2016-05-30 MED ORDER — DIPHENHYDRAMINE HCL 25 MG PO CAPS
ORAL_CAPSULE | ORAL | Status: AC
  Filled 2016-05-30: qty 2

## 2016-05-30 MED ORDER — ACETAMINOPHEN 325 MG PO TABS
ORAL_TABLET | ORAL | Status: AC
  Filled 2016-05-30: qty 2

## 2016-05-30 NOTE — Patient Instructions (Addendum)
[facility] Discharge Instructions for Patients Receiving Chemotherapy  Today you received the following chemotherapy agents Rituximab  To help prevent nausea and vomiting after your treatment, we encourage you to take your nausea medication as prescribed   If you develop nausea and vomiting that is not controlled by your nausea medication, call the clinic.   BELOW ARE SYMPTOMS THAT SHOULD BE REPORTED IMMEDIATELY:  *FEVER GREATER THAN 100.5 F  *CHILLS WITH OR WITHOUT FEVER  NAUSEA AND VOMITING THAT IS NOT CONTROLLED WITH YOUR NAUSEA MEDICATION  *UNUSUAL SHORTNESS OF BREATH  *UNUSUAL BRUISING OR BLEEDING  TENDERNESS IN MOUTH AND THROAT WITH OR WITHOUT PRESENCE OF ULCERS  *URINARY PROBLEMS  *BOWEL PROBLEMS  UNUSUAL RASH Items with * indicate a potential emergency and should be followed up as soon as possible.  Feel free to call the clinic you have any questions or concerns. The clinic phone number is [phone].  Please show the [facility] at check-in to the Emergency Department and triage nurse.  Rituximab injection What is this medicine? RITUXIMAB (ri TUX i mab) is a monoclonal antibody. It is used to treat non-Hodgkin lymphoma and chronic lymphocytic leukemia. It is also used to treat rheumatoid arthritis (RA). In RA, this medicine slows the inflammatory process and help reduce joint pain and swelling. This medicine is often used with other cancer or arthritis medications. This medicine may be used for other purposes; ask your health care provider or pharmacist if you have questions. COMMON BRAND NAME(S): Rituxan What should I tell my health care provider before I take this medicine? They need to know if you have any of these conditions: -heart disease -infection (especially a virus infection such as hepatitis B, chickenpox, cold sores, or herpes) -immune system problems -irregular heartbeat -kidney disease -lung or breathing disease, like  asthma -recently received or scheduled to receive a vaccine -an unusual or allergic reaction to rituximab, mouse proteins, other medicines, foods, dyes, or preservatives -pregnant or trying to get pregnant -breast-feeding How should I use this medicine? This medicine is for infusion into a vein. It is administered in a hospital or clinic by a specially trained health care professional. A special MedGuide will be given to you by the pharmacist with each prescription and refill. Be sure to read this information carefully each time. Talk to your pediatrician regarding the use of this medicine in children. This medicine is not approved for use in children. Overdosage: If you think you have taken too much of this medicine contact a poison control center or emergency room at once. NOTE: This medicine is only for you. Do not share this medicine with others. What if I miss a dose? It is important not to miss a dose. Call your doctor or health care professional if you are unable to keep an appointment. What may interact with this medicine? -cisplatin -medicines for blood pressure -some other medicines for arthritis -vaccines This list may not describe all possible interactions. Give your health care provider a list of all the medicines, herbs, non-prescription drugs, or dietary supplements you use. Also tell them if you smoke, drink alcohol, or use illegal drugs. Some items may interact with your medicine. What should I watch for while using this medicine? Report any side effects that you notice during your treatment right away, such as changes in your breathing, fever, chills, dizziness or lightheadedness. These effects are more common with the first dose. Visit your prescriber or health care professional for checks on your progress.  You will need to have regular blood work. Report any other side effects. The side effects of this medicine can continue after you finish your treatment. Continue your  course of treatment even though you feel ill unless your doctor tells you to stop. Call your doctor or health care professional for advice if you get a fever, chills or sore throat, or other symptoms of a cold or flu. Do not treat yourself. This drug decreases your body's ability to fight infections. Try to avoid being around people who are sick. This medicine may increase your risk to bruise or bleed. Call your doctor or health care professional if you notice any unusual bleeding. Be careful brushing and flossing your teeth or using a toothpick because you may get an infection or bleed more easily. If you have any dental work done, tell your dentist you are receiving this medicine. Avoid taking products that contain aspirin, acetaminophen, ibuprofen, naproxen, or ketoprofen unless instructed by your doctor. These medicines may hide a fever. Do not become pregnant while taking this medicine. Women should inform their doctor if they wish to become pregnant or think they might be pregnant. There is a potential for serious side effects to an unborn child. Talk to your health care professional or pharmacist for more information. Do not breast-feed an infant while taking this medicine. What side effects may I notice from receiving this medicine? Side effects that you should report to your doctor or health care professional as soon as possible: -allergic reactions like skin rash, itching or hives, swelling of the face, lips, or tongue -low blood counts - this medicine may decrease the number of white blood cells, red blood cells and platelets. You may be at increased risk for infections and bleeding. -signs of infection - fever or chills, cough, sore throat, pain or difficulty passing urine -signs of decreased platelets or bleeding - bruising, pinpoint red spots on the skin, black, tarry stools, blood in the urine -signs of decreased red blood cells - unusually weak or tired, fainting spells,  lightheadedness -breathing problems -confused, not responsive -chest pain -fast, irregular heartbeat -feeling faint or lightheaded, falls -mouth sores -redness, blistering, peeling or loosening of the skin, including inside the mouth -stomach pain -swelling of the ankles, feet, or hands -trouble passing urine or change in the amount of urine Side effects that usually do not require medical attention (report to your doctor or health care professional if they continue or are bothersome): -anxiety -headache -loss of appetite -muscle aches -nausea -night sweats This list may not describe all possible side effects. Call your doctor for medical advice about side effects. You may report side effects to FDA at 1-800-FDA-1088. Where should I keep my medicine? This drug is given in a hospital or clinic and will not be stored at home. NOTE: This sheet is a summary. It may not cover all possible information. If you have questions about this medicine, talk to your doctor, pharmacist, or health care provider.  Bone Marrow Aspiration and Bone Marrow Biopsy, Adult, Care After This sheet gives you information about how to care for yourself after your procedure. Your health care provider may also give you more specific instructions. If you have problems or questions, contact your health care provider. What can I expect after the procedure? After the procedure, it is common to have:  Mild pain and tenderness.  Swelling.  Bruising. Follow these instructions at home:  Take over-the-counter or prescription medicines only as told by your health care  provider.  Do not take baths, swim, or use a hot tub until your health care provider approves. Ask if you can take a shower or have a sponge bath.  Follow instructions from your health care provider about how to take care of the puncture site. Make sure you:  Wash your hands with soap and water before you change your bandage (dressing). If soap and  water are not available, use hand sanitizer.  Change your dressing as told by your health care provider.  Check your puncture siteevery day for signs of infection. Check for:  More redness, swelling, or pain.  More fluid or blood.  Warmth.  Pus or a bad smell.  Return to your normal activities as told by your health care provider. Ask your health care provider what activities are safe for you.  Do not drive for 24 hours if you were given a medicine to help you relax (sedative).  Keep all follow-up visits as told by your health care provider. This is important. Contact a health care provider if:  You have more redness, swelling, or pain around the puncture site.  You have more fluid or blood coming from the puncture site.  Your puncture site feels warm to the touch.  You have pus or a bad smell coming from the puncture site.  You have a fever.  Your pain is not controlled with medicine. This information is not intended to replace advice given to you by your health care provider. Make sure you discuss any questions you have with your health care provider. Document Released: 11/04/2004 Document Revised: 11/05/2015 Document Reviewed: 09/29/2015 Elsevier Interactive Patient Education  2017 [place].  2017 Elsevier/Gold Standard (2015-10-28 17:23:26)

## 2016-05-30 NOTE — Telephone Encounter (Signed)
This RN contacted flow and arranged for BMBX to be done today at 11 am per Butch Penny in flow cytometry.

## 2016-05-30 NOTE — Progress Notes (Signed)
Patient Care Team: Christain Sacramento, MD as PCP - General (Family Medicine)  DIAGNOSIS:  Encounter Diagnosis  Name Primary?  . Acute ITP ([place])     CHIEF COMPLIANT: Follow-up of acute ITP  INTERVAL HISTORY: [name] is a 76 year old gentleman with above-mentioned history of acute ITP who did not respond to prednisone therapy as well as IVIG. He is here today to review his blood work and to determine if additional treatment will be necessary. He has had intermittent nosebleeds and we have prescribed Amicar.  REVIEW OF SYSTEMS:   Constitutional: Denies fevers, chills or abnormal weight loss Eyes: Denies blurriness of vision Ears, nose, mouth, throat, and face: Nosebleeds Respiratory: Denies cough, dyspnea or wheezes Cardiovascular: Denies palpitation, chest discomfort Gastrointestinal:  Denies nausea, heartburn or change in bowel habits Skin: Denies abnormal skin rashes Lymphatics: Denies new lymphadenopathy or easy bruising Neurological:Denies numbness, tingling or new weaknesses Behavioral/Psych: Mood is stable, no new changes  Extremities: No lower extremity edema  All other systems were reviewed with the patient and are negative.  I have reviewed the past medical history, past surgical history, social history and family history with the patient and they are unchanged from previous note.  ALLERGIES:  has No Known Allergies.  MEDICATIONS:  Current Outpatient Prescriptions  Medication Sig Dispense Refill  . albuterol (PROAIR HFA) 108 (90 Base) MCG/ACT inhaler 2 puffs every 4 hours as needed only  if your can't catch your breath 1 Inhaler 11  . aminocaproic acid (AMICAR) 500 MG tablet Take 2 tablets (1,000 mg total) by mouth every 8 (eight) hours. As Needed 60 tablet 1  . dextromethorphan-guaiFENesin (MUCINEX DM) 30-600 MG 12hr tablet Take 1 tablet by mouth 2 (two) times daily as needed for cough.    . DOCOSAHEXAENOIC ACID PO Take 1 g by mouth.    . famotidine (PEPCID) 20 MG  tablet One at bedtime    . Ginkgo Biloba 40 MG TABS Take 150 mg by mouth daily.    . Glucosamine-Chondroit-Vit C-Mn (GLUCOSAMINE 1500 COMPLEX PO) Take 1,500 mg by mouth 2 (two) times daily.    . Insulin Glargine (TOUJEO SOLOSTAR) 300 UNIT/ML SOPN Inject 45 Units into the skin daily.    Marland Kitchen losartan (COZAAR) 50 MG tablet Take 1 tablet (50 mg total) by mouth daily. 90 tablet 2  . metFORMIN (GLUCOPHAGE) 1000 MG tablet Take 1,000 mg by mouth 2 (two) times daily with a meal.    . Multiple Vitamins-Minerals (MULTIVITAMIN PO) Take by mouth 1 day or 1 dose.    . pantoprazole (PROTONIX) 40 MG tablet Take 1 tablet (40 mg total) by mouth daily. Take 30-60 min before first meal of the day 30 tablet 2  . predniSONE (DELTASONE) 20 MG tablet Take 4.5 tablets (90 mg total) by mouth daily with breakfast. 135 tablet 1  . Respiratory Therapy Supplies (FLUTTER) DEVI Use as directed 1 each 0  . simvastatin (ZOCOR) 80 MG tablet Take 80 mg by mouth at bedtime.    . Tiotropium Bromide-Olodaterol (STIOLTO RESPIMAT) 2.5-2.5 MCG/ACT AERS Inhale 2 puffs into the lungs daily. 1 Inhaler 0  . UNABLE TO FIND Med Name: [employer] Greens otc daily     No current facility-administered medications for this visit.     PHYSICAL EXAMINATION: ECOG PERFORMANCE STATUS: 1 - Symptomatic but completely ambulatory  Vitals:   05/30/16 0900  BP: (!) 118/59  Pulse: 98  Resp: 18  Temp: 97.5 F (36.4 C)   Filed Weights  05/30/16 0900  Weight: 215 lb 9.6 oz (97.8 kg)    GENERAL:alert, no distress and comfortable SKIN: skin color, texture, turgor are normal, no rashes or significant lesions EYES: normal, Conjunctiva are pink and non-injected, sclera clear OROPHARYNX:no exudate, no erythema and lips, buccal mucosa, and tongue normal  NECK: supple, thyroid normal size, non-tender, without nodularity LYMPH:  no palpable lymphadenopathy in the cervical, axillary or inguinal LUNGS: clear to auscultation and percussion  with normal breathing effort HEART: regular rate & rhythm and no murmurs and no lower extremity edema ABDOMEN:abdomen soft, non-tender and normal bowel sounds MUSCULOSKELETAL:no cyanosis of digits and no clubbing  NEURO: alert & oriented x 3 with fluent speech, no focal motor/sensory deficits EXTREMITIES: No lower extremity edema  LABORATORY DATA:  I have reviewed the data as listed   Chemistry      Component Value Date/Time   NA 136 05/15/2016 0938   K 4.5 05/15/2016 0938   CL 96 05/05/2016 1450   CO2 31 (H) 05/15/2016 0938   BUN 20.6 05/15/2016 0938   CREATININE 0.8 05/15/2016 0938      Component Value Date/Time   CALCIUM 8.7 05/15/2016 0938   ALKPHOS 64 05/15/2016 0938   AST 8 05/15/2016 0938   ALT 10 05/15/2016 0938   BILITOT 0.39 05/15/2016 0938       Lab Results  Component Value Date   WBC 11.2 (H) 05/30/2016   HGB 9.7 (L) 05/30/2016   HCT 30.2 (L) 05/30/2016   MCV 90.7 05/30/2016   PLT <2 (LL) 05/30/2016   NEUTROABS 8.0 (H) 05/30/2016    ASSESSMENT & PLAN:  Acute ITP ([name]) Most likely precipitated by his underlying lung infection. Currently on prednisone 90 mg daily.  Steroid-related side effects:  1. Acid reflux and gastritis: Patient is currently on Protonix and Pepcid I encouraged him to continue with both  2. I discussed with them that his blood sugars would be elevated due to steroids. He is already adjusting his insulin doses.   Bilateral pneumonia: Patient has been discussing with his pulmonologist Dr.Wert regarding his complex findings on the CT scan.  Patient saw Dr. Melvyn Novas today.  S/P IVIG Today's platelets are less than 2000.  Because of patient has not responded to treatment, I recommended the following 1. Bone marrow biopsy 2. CT chest abdomen pelvis Initiate Rituxan weekly 4  Prior lung honeycombing: I will discuss with Dr. Melvyn Novas regarding possibility of obtaining a bronchoscopy or lavage.  Leg swelling: Encouraged him to take Lasix  and elevate his legs. I will reduce the dosage of prednisone to 60 mg daily and then every week we'll reduce it by 20 mg   return to clinic in one week for follow-up to review the tests    Bone Marrow Biopsy and Aspiration Procedure Note   Indication: Severe thrombocytopenia  Informed consent was obtained and potential risks including bleeding, infection and pain were reviewed with the patient. I verified that the patient has been fasting since midnight. The patient's name, date of birth, identification, consent and allergies were verified prior to the start of procedure and time out was performed.  The left posterior iliac crest was chosen as the site of biopsy. The skin was prepped with Betadine solution.  8 cc of 1% lidocaine was used to provide local anaesthesia.   10 cc of bone marrow aspirate was obtained followed by 1/2 inch biopsy.  Pressure was applied to the biopsy site and bandage was placed over the biopsy site. Patient was  made to lie on her back for 15 mins prior to discharge.  The procedure was tolerated well. COMPLICATIONS: None BLOOD LOSS: none The patient was discharged home in stable condition with a 1 week follow up to review results. She was provided with post bone marrow biopsy instructions and instructed to call if there was any bleeding or worsening pain.  Specimens sent for flow cytometry, cytogenetics and additional studies. The procedure was assisted by Charlestine Massed, NP Signed Rulon Eisenmenger, MD   I spent 25 minutes talking to the patient of which more than half was spent in counseling and coordination of care.  No orders of the defined types were placed in this encounter.  The patient has a good understanding of the overall plan. he agrees with it. he will call with any problems that may develop before the next visit here.   Rulon Eisenmenger, MD 05/30/16

## 2016-05-30 NOTE — Progress Notes (Signed)
1030 - IVF's paused and patient escorted to Dr. Geralyn Flash office for bone marrow biopsy. 1205 - Patient returned to infusion room via wheelchair in NAD. Bleeding at procedure site was controlled prior to arriving in tx area. Patient denied discomfort. Procedure site checked multiple times throughout tx with no signs of active bleeding. Dressing C/D/I.

## 2016-05-31 ENCOUNTER — Other Ambulatory Visit: Payer: Self-pay | Admitting: Emergency Medicine

## 2016-06-02 ENCOUNTER — Ambulatory Visit (HOSPITAL_COMMUNITY): Payer: Medicare HMO

## 2016-06-06 ENCOUNTER — Telehealth: Payer: Self-pay

## 2016-06-06 ENCOUNTER — Encounter: Payer: Self-pay | Admitting: Hematology and Oncology

## 2016-06-06 ENCOUNTER — Other Ambulatory Visit (HOSPITAL_BASED_OUTPATIENT_CLINIC_OR_DEPARTMENT_OTHER): Payer: Medicare HMO

## 2016-06-06 ENCOUNTER — Other Ambulatory Visit: Payer: Self-pay | Admitting: Emergency Medicine

## 2016-06-06 ENCOUNTER — Ambulatory Visit (HOSPITAL_BASED_OUTPATIENT_CLINIC_OR_DEPARTMENT_OTHER): Payer: Medicare HMO

## 2016-06-06 ENCOUNTER — Ambulatory Visit (HOSPITAL_BASED_OUTPATIENT_CLINIC_OR_DEPARTMENT_OTHER): Payer: Medicare HMO | Admitting: Hematology and Oncology

## 2016-06-06 VITALS — BP 104/59 | HR 107 | Temp 98.0°F | Resp 18

## 2016-06-06 DIAGNOSIS — R600 Localized edema: Secondary | ICD-10-CM

## 2016-06-06 DIAGNOSIS — Z5112 Encounter for antineoplastic immunotherapy: Secondary | ICD-10-CM | POA: Diagnosis not present

## 2016-06-06 DIAGNOSIS — D693 Immune thrombocytopenic purpura: Secondary | ICD-10-CM | POA: Diagnosis not present

## 2016-06-06 DIAGNOSIS — R0602 Shortness of breath: Secondary | ICD-10-CM | POA: Diagnosis not present

## 2016-06-06 LAB — CBC WITH DIFFERENTIAL/PLATELET
BASO%: 0.1 % (ref 0.0–2.0)
BASOS ABS: 0 10*3/uL (ref 0.0–0.1)
EOS%: 0.8 % (ref 0.0–7.0)
Eosinophils Absolute: 0.1 10*3/uL (ref 0.0–0.5)
HEMATOCRIT: 34.7 % — AB (ref 38.4–49.9)
HEMOGLOBIN: 10.7 g/dL — AB (ref 13.0–17.1)
LYMPH%: 10 % — AB (ref 14.0–49.0)
MCH: 29.1 pg (ref 27.2–33.4)
MCHC: 30.8 g/dL — AB (ref 32.0–36.0)
MCV: 94.3 fL (ref 79.3–98.0)
MONO#: 0.9 10*3/uL (ref 0.1–0.9)
MONO%: 6.5 % (ref 0.0–14.0)
NEUT#: 11 10*3/uL — ABNORMAL HIGH (ref 1.5–6.5)
NEUT%: 82.6 % — AB (ref 39.0–75.0)
Platelets: 3 10*3/uL — CL (ref 140–400)
RBC: 3.68 10*6/uL — ABNORMAL LOW (ref 4.20–5.82)
RDW: 16 % — ABNORMAL HIGH (ref 11.0–14.6)
WBC: 13.3 10*3/uL — ABNORMAL HIGH (ref 4.0–10.3)
lymph#: 1.3 10*3/uL (ref 0.9–3.3)
nRBC: 0 % (ref 0–0)

## 2016-06-06 LAB — COMPREHENSIVE METABOLIC PANEL
ALBUMIN: 2.4 g/dL — AB (ref 3.5–5.0)
ALK PHOS: 71 U/L (ref 40–150)
ALT: 18 U/L (ref 0–55)
AST: 14 U/L (ref 5–34)
Anion Gap: 8 mEq/L (ref 3–11)
BUN: 24.5 mg/dL (ref 7.0–26.0)
CALCIUM: 8.9 mg/dL (ref 8.4–10.4)
CHLORIDE: 96 meq/L — AB (ref 98–109)
CO2: 32 mEq/L — ABNORMAL HIGH (ref 22–29)
Creatinine: 0.8 mg/dL (ref 0.7–1.3)
EGFR: 86 mL/min/{1.73_m2} — AB (ref >90)
Glucose: 225 mg/dl — ABNORMAL HIGH (ref 70–140)
POTASSIUM: 3.7 meq/L (ref 3.5–5.1)
SODIUM: 136 meq/L (ref 136–145)
Total Bilirubin: 0.56 mg/dL (ref 0.20–1.20)
Total Protein: 6.7 g/dL (ref 6.4–8.3)

## 2016-06-06 LAB — CHROMOSOME ANALYSIS, BONE MARROW

## 2016-06-06 MED ORDER — DIPHENHYDRAMINE HCL 25 MG PO CAPS
ORAL_CAPSULE | ORAL | Status: AC
  Filled 2016-06-06: qty 2

## 2016-06-06 MED ORDER — ACETAMINOPHEN 325 MG PO TABS
650.0000 mg | ORAL_TABLET | Freq: Once | ORAL | Status: AC
  Administered 2016-06-06: 650 mg via ORAL

## 2016-06-06 MED ORDER — ACETAMINOPHEN 325 MG PO TABS
ORAL_TABLET | ORAL | Status: AC
Start: 2016-06-06 — End: 2016-06-06
  Filled 2016-06-06: qty 2

## 2016-06-06 MED ORDER — SODIUM CHLORIDE 0.9 % IV SOLN
Freq: Once | INTRAVENOUS | Status: AC
  Administered 2016-06-06: 12:00:00 via INTRAVENOUS

## 2016-06-06 MED ORDER — SODIUM CHLORIDE 0.9 % IV SOLN: 375.0000 mg/m2 | Freq: Once | INTRAVENOUS | Status: DC

## 2016-06-06 MED ORDER — DIPHENHYDRAMINE HCL 25 MG PO CAPS
50.0000 mg | ORAL_CAPSULE | Freq: Once | ORAL | Status: AC
  Administered 2016-06-06: 50 mg via ORAL

## 2016-06-06 MED ORDER — FUROSEMIDE 10 MG/ML IJ SOLN
20.0000 mg | Freq: Once | INTRAMUSCULAR | Status: AC
  Administered 2016-06-06: 20 mg via INTRAVENOUS

## 2016-06-06 MED ORDER — SODIUM CHLORIDE 0.9 % IV SOLN
375.0000 mg/m2 | Freq: Once | INTRAVENOUS | Status: AC
  Administered 2016-06-06: 800 mg via INTRAVENOUS
  Filled 2016-06-06: qty 50

## 2016-06-06 MED ORDER — FUROSEMIDE 10 MG/ML IJ SOLN
INTRAMUSCULAR | Status: AC
Start: 2016-06-06 — End: 2016-06-06
  Filled 2016-06-06: qty 2

## 2016-06-06 NOTE — Patient Instructions (Signed)
[facility] Discharge Instructions for Patients Receiving Chemotherapy  Today you received the following chemotherapy agents Rituxan To help prevent nausea and vomiting after your treatment, we encourage you to take your nausea medication as prescribed.  If you develop nausea and vomiting that is not controlled by your nausea medication, call the clinic.   BELOW ARE SYMPTOMS THAT SHOULD BE REPORTED IMMEDIATELY:  *FEVER GREATER THAN 100.5 F  *CHILLS WITH OR WITHOUT FEVER  NAUSEA AND VOMITING THAT IS NOT CONTROLLED WITH YOUR NAUSEA MEDICATION  *UNUSUAL SHORTNESS OF BREATH  *UNUSUAL BRUISING OR BLEEDING  TENDERNESS IN MOUTH AND THROAT WITH OR WITHOUT PRESENCE OF ULCERS  *URINARY PROBLEMS  *BOWEL PROBLEMS  UNUSUAL RASH Items with * indicate a potential emergency and should be followed up as soon as possible.  Feel free to call the clinic you have any questions or concerns. The clinic phone number is [phone].  Please show the CHEMO ALERT CARD at check-in to the Emergency Department and triage nurse.   

## 2016-06-06 NOTE — Progress Notes (Signed)
Pt bp in the low 100's but asymptomatic. Pt does have baseline doe and cough. Gave pt lasix 20mg  IV, as intructed by Dr.Gudena post Rituxan. Pt accompanied to his car after discharge. Advised pt to call if he has any concerns.Pt verbalized understanding.

## 2016-06-06 NOTE — Telephone Encounter (Signed)
Pt requested a call back about his CT test tomorrow and precertification. S/w Elmyra Ricks in managed care. The PA was denied and a peer to peer was given to Dr [name]. Called pt back. The pt is aware and had the CT r/s to Friday 2/9 in hopes that peer to peer is quickly successful.

## 2016-06-06 NOTE — Progress Notes (Signed)
Patient Care Team: Christain Sacramento, MD as PCP - General (Family Medicine)  DIAGNOSIS:  Encounter Diagnosis  Name Primary?  . Acute ITP ([place])    Current treatment: Rituximab week 2/4 CHIEF COMPLIANT: Leg swelling and shortness of breath  INTERVAL HISTORY: [name] is a 76 year old with above-mentioned history of ITP currently on rituximab. He had a bone marrow biopsy last week and appears to be healing well. He continues to remain short of breath to minimal exertion. He also continues to have performed bilateral lower extremity swelling.  REVIEW OF SYSTEMS:   Constitutional: Denies fevers, chills or abnormal weight loss Eyes: Denies blurriness of vision Ears, nose, mouth, throat, and face: Denies mucositis or sore throat Respiratory: Shortness of breath and cough with productive sputum denies any fevers Cardiovascular: Denies palpitation, chest discomfort Gastrointestinal:  Denies nausea, heartburn or change in bowel habits Skin: Denies abnormal skin rashes Lymphatics: Denies new lymphadenopathy or easy bruising Neurological:Denies numbness, tingling or new weaknesses Behavioral/Psych: Mood is stable, no new changes  Extremities: Profound lower extremity edema in spite of Lasix All other systems were reviewed with the patient and are negative.  I have reviewed the past medical history, past surgical history, social history and family history with the patient and they are unchanged from previous note.  ALLERGIES:  has No Known Allergies.  MEDICATIONS:  Current Outpatient Prescriptions  Medication Sig Dispense Refill  . albuterol (PROAIR HFA) 108 (90 Base) MCG/ACT inhaler 2 puffs every 4 hours as needed only  if your can't catch your breath 1 Inhaler 11  . aminocaproic acid (AMICAR) 500 MG tablet Take 2 tablets (1,000 mg total) by mouth every 8 (eight) hours. As Needed 60 tablet 1  . dextromethorphan-guaiFENesin (MUCINEX DM) 30-600 MG 12hr tablet Take 1 tablet by mouth 2  (two) times daily as needed for cough.    . DOCOSAHEXAENOIC ACID PO Take 1 g by mouth.    . famotidine (PEPCID) 20 MG tablet One at bedtime    . Ginkgo Biloba 40 MG TABS Take 150 mg by mouth daily.    . Glucosamine-Chondroit-Vit C-Mn (GLUCOSAMINE 1500 COMPLEX PO) Take 1,500 mg by mouth 2 (two) times daily.    . Insulin Glargine (TOUJEO SOLOSTAR) 300 UNIT/ML SOPN Inject 45 Units into the skin daily.    Marland Kitchen losartan (COZAAR) 50 MG tablet Take 1 tablet (50 mg total) by mouth daily. 90 tablet 2  . metFORMIN (GLUCOPHAGE) 1000 MG tablet Take 1,000 mg by mouth 2 (two) times daily with a meal.    . Multiple Vitamins-Minerals (MULTIVITAMIN PO) Take by mouth 1 day or 1 dose.    . pantoprazole (PROTONIX) 40 MG tablet Take 1 tablet (40 mg total) by mouth daily. Take 30-60 min before first meal of the day 30 tablet 2  . predniSONE (DELTASONE) 20 MG tablet Take 4.5 tablets (90 mg total) by mouth daily with breakfast. 135 tablet 1  . Respiratory Therapy Supplies (FLUTTER) DEVI Use as directed 1 each 0  . simvastatin (ZOCOR) 80 MG tablet Take 80 mg by mouth at bedtime.    . Tiotropium Bromide-Olodaterol (STIOLTO RESPIMAT) 2.5-2.5 MCG/ACT AERS Inhale 2 puffs into the lungs daily. 1 Inhaler 0  . UNABLE TO FIND Med Name: [employer] Greens otc daily     Current Facility-Administered Medications  Medication Dose Route Frequency Provider Last Rate Last Dose  . furosemide (LASIX) injection 20 mg  20 mg Intravenous Once Nicholas Lose, MD  PHYSICAL EXAMINATION: ECOG PERFORMANCE STATUS: 2 - Symptomatic, <50% confined to bed  Vitals:   06/06/16 1115  BP: (!) 114/50  Pulse: (!) 125  Resp: 20  Temp: 97.7 F (36.5 C)   Filed Weights   06/06/16 1115  Weight: 211 lb 8 oz (95.9 kg)    GENERAL:alert, no distress and comfortable SKIN: skin color, texture, turgor are normal, no rashes or significant lesions EYES: normal, Conjunctiva are pink and non-injected, sclera clear OROPHARYNX:no  exudate, no erythema and lips, buccal mucosa, and tongue normal  NECK: supple, thyroid normal size, non-tender, without nodularity LYMPH:  no palpable lymphadenopathy in the cervical, axillary or inguinal LUNGS: Bilateral lung crackles HEART: regular rate & rhythm and no murmurs and no lower extremity edema ABDOMEN:abdomen soft, non-tender and normal bowel sounds MUSCULOSKELETAL:no cyanosis of digits and no clubbing  NEURO: alert & oriented x 3 with fluent speech, no focal motor/sensory deficits EXTREMITIES: 3+ lower extremity edema  LABORATORY DATA:  I have reviewed the data as listed   Chemistry      Component Value Date/Time   NA 136 06/06/2016 1101   K 3.7 06/06/2016 1101   CL 96 05/05/2016 1450   CO2 32 (H) 06/06/2016 1101   BUN 24.5 06/06/2016 1101   CREATININE 0.8 06/06/2016 1101      Component Value Date/Time   CALCIUM 8.9 06/06/2016 1101   ALKPHOS 71 06/06/2016 1101   AST 14 06/06/2016 1101   ALT 18 06/06/2016 1101   BILITOT 0.56 06/06/2016 1101       Lab Results  Component Value Date   WBC 13.3 (H) 06/06/2016   HGB 10.7 (L) 06/06/2016   HCT 34.7 (L) 06/06/2016   MCV 94.3 06/06/2016   PLT 3 (LL) 06/06/2016   NEUTROABS 11.0 (H) 06/06/2016    ASSESSMENT & PLAN:  Acute ITP ([facility]) Most likely precipitated by his underlying lung infection. Currently on prednisone tapering dose 60 mg daily.  Steroid-related side effects:  1. Acid reflux and gastritis: Patient is currently on Protonix and Pepcid I encouraged him to continue with both  2. elevated blood sugars. He had adjusted his insulin doses.   Bilateral pneumonia: Patient has been discussing with his pulmonologist Dr.Wert regarding his complex findings on the CT scan. Patient saw Dr. Melvyn Novas today.  S/P IVIG (lack of response) Current treatment: Rituximab started 05/30/2016 weekly 4 Rituxan toxicities: Denies any major side effects Rituxan.  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 Melvyn Novas regarding possibility of obtaining a bronchoscopy or lavage. Dr. Melvyn Novas will review his CT scans from tomorrow and then make a decision. Leg swelling: Encouraged him to take Lasix and elevate his legs. Return to clinic in one week for follow-up to review the tests and to receive Rituxan   I spent 25 minutes talking to the patient of which more than half was spent in counseling and coordination of care.  No orders of the defined types were placed in this encounter.  The patient has a good understanding of the overall plan. he agrees with it. he will call with any problems that may develop before the next visit here.   Rulon Eisenmenger, MD 06/06/16

## 2016-06-06 NOTE — Assessment & Plan Note (Signed)
Most likely precipitated by his underlying lung infection. Currently on prednisone tapering dose 60 mg daily.  Steroid-related side effects:  1. Acid reflux and gastritis: Patient is currently on Protonix and Pepcid I encouraged him to continue with both  2. elevated blood sugars. He had adjusted his insulin doses.   Bilateral pneumonia: Patient has been discussing with his pulmonologist Dr.Wert regarding his complex findings on the CT scan. Patient saw Dr. Wert today.  S/P IVIG (lack of response) Current treatment: Rituximab started 05/30/2016 weekly 4 Rituxan toxicities:  Today's platelets are   Bone marrow biopsy 05/30/2016: Findings suggestive of ITP with lots of megakaryocytes CT chest abdomen pelvis to be done 06/07/2016  Prior lung honeycombing: I will discuss with Dr. Wert regarding possibility of obtaining a bronchoscopy or lavage.  Leg swelling: Encouraged him to take Lasix and elevate his legs.  Return to clinic in one week for follow-up to review the tests and to receive Rituxan 

## 2016-06-07 ENCOUNTER — Ambulatory Visit (HOSPITAL_COMMUNITY): Payer: Medicare HMO

## 2016-06-08 ENCOUNTER — Telehealth: Payer: Self-pay | Admitting: Hematology and Oncology

## 2016-06-08 NOTE — Telephone Encounter (Signed)
Faxed requested paperwork to [employer] fax#[phone] °

## 2016-06-08 NOTE — Telephone Encounter (Signed)
Wife called for information about the status of PA for the CT scan.

## 2016-06-08 NOTE — Telephone Encounter (Signed)
Called patient and advised him that the CT scan has been approved. Patient verbalized understanding.

## 2016-06-09 ENCOUNTER — Encounter (HOSPITAL_COMMUNITY): Payer: Self-pay

## 2016-06-09 ENCOUNTER — Ambulatory Visit (HOSPITAL_COMMUNITY)
Admission: RE | Admit: 2016-06-09 | Discharge: 2016-06-09 | Disposition: A | Discharge status: Home / Self Care | Payer: Medicare HMO | Source: Ambulatory Visit | Attending: Hematology and Oncology | Admitting: Hematology and Oncology

## 2016-06-09 DIAGNOSIS — M47896 Other spondylosis, lumbar region: Secondary | ICD-10-CM | POA: Insufficient documentation

## 2016-06-09 DIAGNOSIS — K5730 Diverticulosis of large intestine without perforation or abscess without bleeding: Secondary | ICD-10-CM

## 2016-06-09 DIAGNOSIS — I2510 Atherosclerotic heart disease of native coronary artery without angina pectoris: Secondary | ICD-10-CM | POA: Insufficient documentation

## 2016-06-09 DIAGNOSIS — R188 Other ascites: Secondary | ICD-10-CM

## 2016-06-09 DIAGNOSIS — A419 Sepsis, unspecified organism: Secondary | ICD-10-CM | POA: Diagnosis not present

## 2016-06-09 DIAGNOSIS — R0602 Shortness of breath: Secondary | ICD-10-CM | POA: Diagnosis not present

## 2016-06-09 DIAGNOSIS — M160 Bilateral primary osteoarthritis of hip: Secondary | ICD-10-CM | POA: Insufficient documentation

## 2016-06-09 DIAGNOSIS — M47894 Other spondylosis, thoracic region: Secondary | ICD-10-CM | POA: Insufficient documentation

## 2016-06-09 DIAGNOSIS — I700 Atherosclerosis of aorta: Secondary | ICD-10-CM

## 2016-06-09 DIAGNOSIS — J90 Pleural effusion, not elsewhere classified: Secondary | ICD-10-CM | POA: Insufficient documentation

## 2016-06-09 DIAGNOSIS — D693 Immune thrombocytopenic purpura: Secondary | ICD-10-CM

## 2016-06-09 DIAGNOSIS — M47892 Other spondylosis, cervical region: Secondary | ICD-10-CM | POA: Insufficient documentation

## 2016-06-09 DIAGNOSIS — J320 Chronic maxillary sinusitis: Secondary | ICD-10-CM

## 2016-06-09 DIAGNOSIS — N281 Cyst of kidney, acquired: Secondary | ICD-10-CM | POA: Insufficient documentation

## 2016-06-09 MED ORDER — IOPAMIDOL (ISOVUE-300) INJECTION 61%
INTRAVENOUS | Status: AC
  Administered 2016-06-09: 100 mL
  Filled 2016-06-09: qty 100

## 2016-06-09 MED ORDER — SODIUM CHLORIDE 0.9 % IJ SOLN
INTRAMUSCULAR | Status: AC
  Filled 2016-06-09: qty 50

## 2016-06-10 ENCOUNTER — Other Ambulatory Visit: Payer: Self-pay

## 2016-06-10 ENCOUNTER — Encounter (HOSPITAL_COMMUNITY): Payer: Self-pay | Admitting: Emergency Medicine

## 2016-06-10 ENCOUNTER — Inpatient Hospital Stay (HOSPITAL_COMMUNITY)
Admission: EM | Admit: 2016-06-10 | Discharge: 2016-06-20 | DRG: 872 | Disposition: A | Discharge status: Home Health | Payer: Medicare HMO | Attending: Family Medicine | Admitting: Family Medicine

## 2016-06-10 ENCOUNTER — Emergency Department (HOSPITAL_COMMUNITY): Payer: Medicare HMO

## 2016-06-10 DIAGNOSIS — R0602 Shortness of breath: Secondary | ICD-10-CM

## 2016-06-10 DIAGNOSIS — M47892 Other spondylosis, cervical region: Secondary | ICD-10-CM | POA: Diagnosis present

## 2016-06-10 DIAGNOSIS — Z8249 Family history of ischemic heart disease and other diseases of the circulatory system: Secondary | ICD-10-CM | POA: Diagnosis not present

## 2016-06-10 DIAGNOSIS — J90 Pleural effusion, not elsewhere classified: Secondary | ICD-10-CM | POA: Diagnosis present

## 2016-06-10 DIAGNOSIS — J320 Chronic maxillary sinusitis: Secondary | ICD-10-CM | POA: Diagnosis present

## 2016-06-10 DIAGNOSIS — Z7952 Long term (current) use of systemic steroids: Secondary | ICD-10-CM

## 2016-06-10 DIAGNOSIS — N281 Cyst of kidney, acquired: Secondary | ICD-10-CM | POA: Diagnosis present

## 2016-06-10 DIAGNOSIS — Z862 Personal history of diseases of the blood and blood-forming organs and certain disorders involving the immune mechanism: Secondary | ICD-10-CM | POA: Diagnosis not present

## 2016-06-10 DIAGNOSIS — I10 Essential (primary) hypertension: Secondary | ICD-10-CM

## 2016-06-10 DIAGNOSIS — N390 Urinary tract infection, site not specified: Secondary | ICD-10-CM | POA: Diagnosis present

## 2016-06-10 DIAGNOSIS — Z9889 Other specified postprocedural states: Secondary | ICD-10-CM

## 2016-06-10 DIAGNOSIS — R000 Tachycardia, unspecified: Secondary | ICD-10-CM | POA: Diagnosis not present

## 2016-06-10 DIAGNOSIS — I471 Supraventricular tachycardia, unspecified: Secondary | ICD-10-CM | POA: Diagnosis present

## 2016-06-10 DIAGNOSIS — E119 Type 2 diabetes mellitus without complications: Secondary | ICD-10-CM | POA: Diagnosis not present

## 2016-06-10 DIAGNOSIS — Z800 Family history of malignant neoplasm of digestive organs: Secondary | ICD-10-CM | POA: Diagnosis not present

## 2016-06-10 DIAGNOSIS — Z86010 Personal history of colonic polyps: Secondary | ICD-10-CM

## 2016-06-10 DIAGNOSIS — I2510 Atherosclerotic heart disease of native coronary artery without angina pectoris: Secondary | ICD-10-CM | POA: Diagnosis present

## 2016-06-10 DIAGNOSIS — K5730 Diverticulosis of large intestine without perforation or abscess without bleeding: Secondary | ICD-10-CM | POA: Diagnosis present

## 2016-06-10 DIAGNOSIS — J471 Bronchiectasis with (acute) exacerbation: Secondary | ICD-10-CM | POA: Diagnosis present

## 2016-06-10 DIAGNOSIS — I472 Ventricular tachycardia: Secondary | ICD-10-CM | POA: Diagnosis not present

## 2016-06-10 DIAGNOSIS — Z79899 Other long term (current) drug therapy: Secondary | ICD-10-CM

## 2016-06-10 DIAGNOSIS — D6959 Other secondary thrombocytopenia: Secondary | ICD-10-CM | POA: Diagnosis not present

## 2016-06-10 DIAGNOSIS — Z794 Long term (current) use of insulin: Secondary | ICD-10-CM

## 2016-06-10 DIAGNOSIS — D693 Immune thrombocytopenic purpura: Secondary | ICD-10-CM | POA: Diagnosis present

## 2016-06-10 DIAGNOSIS — Z9221 Personal history of antineoplastic chemotherapy: Secondary | ICD-10-CM | POA: Diagnosis not present

## 2016-06-10 DIAGNOSIS — I700 Atherosclerosis of aorta: Secondary | ICD-10-CM | POA: Diagnosis present

## 2016-06-10 DIAGNOSIS — E785 Hyperlipidemia, unspecified: Secondary | ICD-10-CM | POA: Diagnosis present

## 2016-06-10 DIAGNOSIS — A419 Sepsis, unspecified organism: Principal | ICD-10-CM | POA: Diagnosis present

## 2016-06-10 DIAGNOSIS — I959 Hypotension, unspecified: Secondary | ICD-10-CM | POA: Diagnosis present

## 2016-06-10 DIAGNOSIS — E11649 Type 2 diabetes mellitus with hypoglycemia without coma: Secondary | ICD-10-CM | POA: Diagnosis present

## 2016-06-10 DIAGNOSIS — R188 Other ascites: Secondary | ICD-10-CM | POA: Diagnosis present

## 2016-06-10 DIAGNOSIS — M160 Bilateral primary osteoarthritis of hip: Secondary | ICD-10-CM | POA: Diagnosis present

## 2016-06-10 DIAGNOSIS — Z87891 Personal history of nicotine dependence: Secondary | ICD-10-CM | POA: Diagnosis not present

## 2016-06-10 DIAGNOSIS — M47896 Other spondylosis, lumbar region: Secondary | ICD-10-CM | POA: Diagnosis present

## 2016-06-10 DIAGNOSIS — M47894 Other spondylosis, thoracic region: Secondary | ICD-10-CM | POA: Diagnosis present

## 2016-06-10 DIAGNOSIS — E876 Hypokalemia: Secondary | ICD-10-CM | POA: Diagnosis present

## 2016-06-10 DIAGNOSIS — Z87442 Personal history of urinary calculi: Secondary | ICD-10-CM | POA: Diagnosis not present

## 2016-06-10 DIAGNOSIS — J449 Chronic obstructive pulmonary disease, unspecified: Secondary | ICD-10-CM | POA: Diagnosis not present

## 2016-06-10 DIAGNOSIS — J939 Pneumothorax, unspecified: Secondary | ICD-10-CM

## 2016-06-10 HISTORY — DX: Personal history of diseases of the blood and blood-forming organs and certain disorders involving the immune mechanism: Z86.2

## 2016-06-10 HISTORY — DX: Personal history of urinary calculi: Z87.442

## 2016-06-10 HISTORY — DX: Type 2 diabetes mellitus without complications: E11.9

## 2016-06-10 LAB — CBC WITH DIFFERENTIAL/PLATELET
Basophils Absolute: 0 10*3/uL (ref 0.0–0.1)
Basophils Relative: 0 %
Eosinophils Absolute: 0 10*3/uL (ref 0.0–0.7)
Eosinophils Relative: 0 %
HCT: 32.6 % — ABNORMAL LOW (ref 39.0–52.0)
HEMOGLOBIN: 10.4 g/dL — AB (ref 13.0–17.0)
LYMPHS ABS: 0.5 10*3/uL — AB (ref 0.7–4.0)
LYMPHS PCT: 3 %
MCH: 29.1 pg (ref 26.0–34.0)
MCHC: 31.9 g/dL (ref 30.0–36.0)
MCV: 91.3 fL (ref 78.0–100.0)
Monocytes Absolute: 0.5 10*3/uL (ref 0.1–1.0)
Monocytes Relative: 3 %
NEUTROS PCT: 94 %
Neutro Abs: 15.2 10*3/uL — ABNORMAL HIGH (ref 1.7–7.7)
Platelets: <5 10*3/uL — CL (ref 150–400)
RBC: 3.57 MIL/uL — ABNORMAL LOW (ref 4.22–5.81)
RDW: 15.6 % — ABNORMAL HIGH (ref 11.5–15.5)
WBC: 16.2 10*3/uL — AB (ref 4.0–10.5)

## 2016-06-10 LAB — URINALYSIS, ROUTINE W REFLEX MICROSCOPIC
BILIRUBIN URINE: NEGATIVE
Glucose, UA: NEGATIVE mg/dL
Ketones, ur: NEGATIVE mg/dL
Nitrite: NEGATIVE
PROTEIN: NEGATIVE mg/dL
SPECIFIC GRAVITY, URINE: 1.01 (ref 1.005–1.030)
pH: 5 (ref 5.0–8.0)

## 2016-06-10 LAB — COMPREHENSIVE METABOLIC PANEL
ALT: 15 U/L — ABNORMAL LOW (ref 17–63)
ANION GAP: 9 (ref 5–15)
AST: 16 U/L (ref 15–41)
Albumin: 2.7 g/dL — ABNORMAL LOW (ref 3.5–5.0)
Alkaline Phosphatase: 62 U/L (ref 38–126)
BUN: 22 mg/dL — ABNORMAL HIGH (ref 6–20)
CHLORIDE: 93 mmol/L — AB (ref 101–111)
CO2: 31 mmol/L (ref 22–32)
Calcium: 8.3 mg/dL — ABNORMAL LOW (ref 8.9–10.3)
Creatinine, Ser: 0.88 mg/dL (ref 0.61–1.24)
GFR calc non Af Amer: >60 mL/min (ref >60)
Glucose, Bld: 240 mg/dL — ABNORMAL HIGH (ref 65–99)
POTASSIUM: 3.9 mmol/L (ref 3.5–5.1)
SODIUM: 133 mmol/L — AB (ref 135–145)
Total Bilirubin: 0.5 mg/dL (ref 0.3–1.2)
Total Protein: 6.9 g/dL (ref 6.5–8.1)

## 2016-06-10 LAB — MRSA PCR SCREENING: MRSA by PCR: NEGATIVE

## 2016-06-10 LAB — PROTIME-INR
INR: 1.13
PROTHROMBIN TIME: 14.6 s (ref 11.4–15.2)

## 2016-06-10 LAB — GLUCOSE, CAPILLARY: GLUCOSE-CAPILLARY: 285 mg/dL — AB (ref 65–99)

## 2016-06-10 LAB — MAGNESIUM: MAGNESIUM: 1.4 mg/dL — AB (ref 1.7–2.4)

## 2016-06-10 LAB — TROPONIN I

## 2016-06-10 LAB — I-STAT CG4 LACTIC ACID, ED: Lactic Acid, Venous: 1.49 mmol/L (ref 0.5–1.9)

## 2016-06-10 LAB — BRAIN NATRIURETIC PEPTIDE: B NATRIURETIC PEPTIDE 5: 109.9 pg/mL — AB (ref 0.0–100.0)

## 2016-06-10 MED ORDER — LEVALBUTEROL HCL 1.25 MG/0.5ML IN NEBU
1.2500 mg | INHALATION_SOLUTION | RESPIRATORY_TRACT | Status: DC | PRN
  Filled 2016-06-10: qty 0.5

## 2016-06-10 MED ORDER — METOPROLOL TARTRATE 5 MG/5ML IV SOLN
5.0000 mg | Freq: Once | INTRAVENOUS | Status: AC
  Administered 2016-06-10: 5 mg via INTRAVENOUS
  Filled 2016-06-10: qty 5

## 2016-06-10 MED ORDER — INSULIN ASPART 100 UNIT/ML ~~LOC~~ SOLN
0.0000 [IU] | Freq: Three times a day (TID) | SUBCUTANEOUS | Status: DC
  Administered 2016-06-11: 11 [IU] via SUBCUTANEOUS
  Administered 2016-06-11 (×2): 3 [IU] via SUBCUTANEOUS
  Administered 2016-06-12: 11 [IU] via SUBCUTANEOUS
  Administered 2016-06-12: 3 [IU] via SUBCUTANEOUS
  Administered 2016-06-13 (×3): 5 [IU] via SUBCUTANEOUS

## 2016-06-10 MED ORDER — ARFORMOTEROL TARTRATE 15 MCG/2ML IN NEBU
15.0000 ug | INHALATION_SOLUTION | Freq: Two times a day (BID) | RESPIRATORY_TRACT | Status: DC
  Administered 2016-06-10 – 2016-06-12 (×4): 15 ug via RESPIRATORY_TRACT
  Filled 2016-06-10 (×4): qty 2

## 2016-06-10 MED ORDER — ADENOSINE 6 MG/2ML IV SOLN
6.0000 mg | Freq: Once | INTRAVENOUS | Status: DC | PRN
  Filled 2016-06-10: qty 2

## 2016-06-10 MED ORDER — METOPROLOL TARTRATE 25 MG PO TABS
25.0000 mg | ORAL_TABLET | Freq: Two times a day (BID) | ORAL | Status: DC
  Administered 2016-06-10 – 2016-06-11 (×2): 25 mg via ORAL
  Filled 2016-06-10 (×2): qty 1

## 2016-06-10 MED ORDER — CEFTRIAXONE SODIUM 1 G IJ SOLR
1.0000 g | Freq: Once | INTRAMUSCULAR | Status: AC
  Administered 2016-06-10: 1 g via INTRAVENOUS
  Filled 2016-06-10: qty 10

## 2016-06-10 MED ORDER — FUROSEMIDE 10 MG/ML IJ SOLN
80.0000 mg | Freq: Two times a day (BID) | INTRAMUSCULAR | Status: DC
  Administered 2016-06-10 – 2016-06-15 (×11): 80 mg via INTRAVENOUS
  Filled 2016-06-10 (×11): qty 8

## 2016-06-10 MED ORDER — INSULIN GLARGINE 100 UNIT/ML ~~LOC~~ SOLN
30.0000 [IU] | Freq: Every day | SUBCUTANEOUS | Status: DC
  Administered 2016-06-10 – 2016-06-11 (×2): 30 [IU] via SUBCUTANEOUS
  Filled 2016-06-10 (×2): qty 0.3

## 2016-06-10 MED ORDER — BUDESONIDE 0.25 MG/2ML IN SUSP
0.2500 mg | Freq: Two times a day (BID) | RESPIRATORY_TRACT | Status: DC
  Administered 2016-06-10 – 2016-06-20 (×11): 0.25 mg via RESPIRATORY_TRACT
  Filled 2016-06-10 (×12): qty 2

## 2016-06-10 MED ORDER — LEVALBUTEROL HCL 1.25 MG/0.5ML IN NEBU
1.2500 mg | INHALATION_SOLUTION | Freq: Four times a day (QID) | RESPIRATORY_TRACT | Status: DC
  Administered 2016-06-10: 1.25 mg via RESPIRATORY_TRACT

## 2016-06-10 MED ORDER — IPRATROPIUM BROMIDE 0.02 % IN SOLN
0.5000 mg | Freq: Three times a day (TID) | RESPIRATORY_TRACT | Status: DC
  Administered 2016-06-11 – 2016-06-13 (×9): 0.5 mg via RESPIRATORY_TRACT
  Filled 2016-06-10 (×9): qty 2.5

## 2016-06-10 MED ORDER — ADENOSINE 6 MG/2ML IV SOLN
INTRAVENOUS | Status: AC
  Filled 2016-06-10: qty 2

## 2016-06-10 MED ORDER — PREDNISONE 20 MG PO TABS
60.0000 mg | ORAL_TABLET | Freq: Every day | ORAL | Status: DC
  Administered 2016-06-11: 40 mg via ORAL
  Filled 2016-06-10 (×2): qty 3

## 2016-06-10 MED ORDER — IPRATROPIUM BROMIDE 0.02 % IN SOLN
0.5000 mg | Freq: Four times a day (QID) | RESPIRATORY_TRACT | Status: DC
  Administered 2016-06-10: 0.5 mg via RESPIRATORY_TRACT
  Filled 2016-06-10: qty 2.5

## 2016-06-10 MED ORDER — IPRATROPIUM-ALBUTEROL 0.5-2.5 (3) MG/3ML IN SOLN: 3.0000 mL | Freq: Four times a day (QID) | RESPIRATORY_TRACT | Status: DC

## 2016-06-10 NOTE — Progress Notes (Signed)
Pt heart rate sustaining 170s for 2 minutes then goes back down to 100 range.  Informed Dr. Cipriano Mile.  Irven Baltimore, RN

## 2016-06-10 NOTE — Progress Notes (Signed)
Patient ID: [name], male   DOB: 1940/10/04, 76 y.o.   MRN: [id number]   Called by nursing regarding intermittent episodes of SVT with rate in the 180s-190s with BP dropping to 60s/50s with MAP 54.  BP at baseline 107/59.  Patient mostly asymptomatic except for mild SOB during episodes.  He has already received metoprolol 25 PO this evening.   When I attempted to order adenosine PRN for sustained SVT I was informed that the policy is that a provider needs to be at bedside to administer adenosine. Additional metoprolol not ordered given low baseline BP and hypotension during episodes of SVT.  Given that patient is becoming unstable during these episodes, nursing may activate rapid response or code status if needed.  In the meantime I have notified CCM as e-Link may be able to be at the bedside quickest.  Check stat mag level and monitor closely.

## 2016-06-10 NOTE — ED Provider Notes (Signed)
[facility] DEPT Provider Note   CSN: [id number] Arrival date & time: 06/10/16  1025     History   Chief Complaint Chief Complaint  Patient presents with  . chemo patient  . Shortness of Breath    HPI [name] is a 76 y.o. male.  HPI Patient presents with shortness of breath and cough. Has been worse over the last 4 months. 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 Thought to be initially cause secondary to infection. Has shortness of breath with minimal exertion. Has had a little bit of a cough with it. Ox levels in the 80s upon arrival. History of bronchiectasis for which he sees Dr. Melvyn Novas. Had CT scan done yesterday showed bilateral pleural effusions. Denies fevers. May have slight dysuria. Has had increased swelling in his legs. Has a cough with some production but states this is really unchanged. No fevers. Past Medical History:  Diagnosis Date  . Adenomatous colon polyp    01/2006  . Chronic pansinusitis   . Diabetes mellitus   . Diverticula of colon   . Hyperlipemia    under control now with meds  . Kidney stones    Stones in past  . Sinus drainage    Cough with post nasal drip    Patient Active Problem List   Diagnosis Date Noted  . Obstructive bronchiectasis ([place]) 05/23/2016  . Acute ITP ([place]) 05/15/2016  . Upper airway cough syndrome 02/17/2016  . SOB (shortness of breath) 02/17/2016  . Cough while on acei and with active sinusitis 02/17/2012  . COPD  GOLD III  02/17/2012  . HBP (high blood pressure) 02/17/2012    Past Surgical History:  Procedure Laterality Date  . COLONOSCOPY    . ESOPHAGOGASTRODUODENOSCOPY    . ESOPHAGOGASTRODUODENOSCOPY  05/19/2011   Procedure: ESOPHAGOGASTRODUODENOSCOPY (EGD);  Surgeon: Wonda Horner, MD;  Location: [facility];  Service: Endoscopy;  Laterality: N/A;  stat CBC  APC also needed  . HOT HEMOSTASIS  05/19/2011   Procedure: HOT HEMOSTASIS (ARGON PLASMA COAGULATION/BICAP);  Surgeon: Wonda Horner, MD;  Location: [facility];  Service: Endoscopy;  Laterality: N/A;  . spinal cyst         Home Medications    Prior to Admission medications   Medication Sig Start Date End Date Taking? Authorizing Provider  albuterol (PROAIR HFA) 108 (90 Base) MCG/ACT inhaler 2 puffs every 4 hours as needed only  if your can't catch your breath 04/04/16  Yes Tanda Rockers, MD  aminocaproic acid (AMICAR) 500 MG tablet Take 2 tablets (1,000 mg total) by mouth every 8 (eight) hours. As Needed 05/24/16  Yes Nicholas Lose, MD  dextromethorphan-guaiFENesin (MUCINEX DM) 30-600 MG 12hr tablet Take 1 tablet by mouth 2 (two) times daily as needed for cough.   Yes Historical Provider, MD  ECHINACEA EXTRACT PO Take 1 capsule by mouth daily.   Yes Historical Provider, MD  famotidine (PEPCID) 20 MG tablet One at bedtime 02/02/16  Yes Tanda Rockers, MD  furosemide (LASIX) 20 MG tablet Take 20 mg by mouth daily. 04/27/16  Yes Historical Provider, MD  GINKGO BILOBA PO Take 1 tablet by mouth daily.    Yes Historical Provider, MD  Glucosamine-Chondroit-Vit C-Mn (GLUCOSAMINE 1500 COMPLEX PO) Take 1,500 mg by mouth 2 (two) times daily.   Yes Historical Provider, MD  Insulin Glargine (TOUJEO SOLOSTAR) 300 UNIT/ML SOPN Inject 30 Units into the skin daily. 05/11/16  Yes Historical Provider, MD  loratadine (CLARITIN) 10 MG tablet Take 10 mg by  mouth daily.   Yes Historical Provider, MD  losartan (COZAAR) 50 MG tablet Take 1 tablet (50 mg total) by mouth daily. 03/26/12  Yes Tanda Rockers, MD  metFORMIN (GLUCOPHAGE) 1000 MG tablet Take 1,000 mg by mouth 2 (two) times daily with a meal.   Yes Historical Provider, MD  Multiple Vitamins-Minerals (MULTIVITAMIN PO) Take by mouth 1 day or 1 dose.   Yes Historical Provider, MD  pantoprazole (PROTONIX) 40 MG tablet Take 1 tablet (40 mg total) by mouth daily. Take 30-60 min before first meal of the day 05/03/16  Yes Tanda Rockers, MD  predniSONE (DELTASONE) 20 MG tablet Take 4.5 tablets (90 mg total) by mouth  daily with breakfast. Patient taking differently: Take 40 mg by mouth daily with breakfast.  05/29/16  Yes Nicholas Lose, MD  simvastatin (ZOCOR) 80 MG tablet Take 80 mg by mouth daily.    Yes Historical Provider, MD  Tiotropium Bromide-Olodaterol (STIOLTO RESPIMAT) 2.5-2.5 MCG/ACT AERS Inhale 2 puffs into the lungs daily. 04/04/16  Yes Tanda Rockers, MD  Respiratory Therapy Supplies (FLUTTER) DEVI Use as directed 02/02/16   Tanda Rockers, MD    Family History Family History  Problem Relation Age of Onset  . Colon cancer Mother   . Heart disease Mother   . Liver cancer Father   . Heart disease Father     Social History Social History  Substance Use Topics  . Smoking status: Former Smoker    Packs/day: 1.00    Years: 42.00    Types: Cigarettes    Start date: 05/02/1959    Quit date: 05/01/2001  . Smokeless tobacco: Never Used  . Alcohol use Yes     Comment: social     Allergies   Patient has no known allergies.   Review of Systems Review of Systems  Constitutional: Negative for appetite change.  HENT: Negative for congestion.   Respiratory: Positive for cough and shortness of breath.   Cardiovascular: Positive for leg swelling. Negative for chest pain.  Gastrointestinal: Negative for abdominal pain.  Genitourinary: Negative for dysuria.  Musculoskeletal: Negative for back pain.  Neurological: Negative for headaches.  Hematological: Negative for adenopathy. Bruises/bleeds easily.  Psychiatric/Behavioral: Negative for confusion.     Physical Exam Updated Vital Signs BP (!) 102/52   Pulse (!) 121   Temp 97.5 F (36.4 C) (Oral)   Resp 23   Ht 5\' 11"  (1.803 m)   Wt 211 lb 1 oz (95.7 kg)   SpO2 98%   BMI 29.44 kg/m   Physical Exam  Constitutional: He appears well-developed.  HENT:  Head: Normocephalic.  Neck: Neck supple.  Cardiovascular:  Tachycardia  Pulmonary/Chest:  Somewhat decreased breath sounds throughout. Personally breathing. Some respiratory  distress.  Abdominal: Soft. There is no tenderness.  Musculoskeletal: He exhibits edema.  Moderate pitting edema to bilateral lower extremities.  Neurological: He is alert.  Skin: Skin is warm. Capillary refill takes less than 2 seconds.  Multiple various areas of bruising on extremities.     ED Treatments / Results  Labs (all labs ordered are listed, but only abnormal results are displayed) Labs Reviewed  COMPREHENSIVE METABOLIC PANEL - Abnormal; Notable for the following:       Result Value   Sodium 133 (*)    Chloride 93 (*)    Glucose, Bld 240 (*)    BUN 22 (*)    Calcium 8.3 (*)    Albumin 2.7 (*)    ALT 15 (*)  All other components within normal limits  CBC WITH DIFFERENTIAL/PLATELET - Abnormal; Notable for the following:    WBC 16.2 (*)    RBC 3.57 (*)    Hemoglobin 10.4 (*)    HCT 32.6 (*)    RDW 15.6 (*)    Platelets <5 (*)    Neutro Abs 15.2 (*)    Lymphs Abs 0.5 (*)    All other components within normal limits  URINALYSIS, ROUTINE W REFLEX MICROSCOPIC - Abnormal; Notable for the following:    Hgb urine dipstick MODERATE (*)    Leukocytes, UA LARGE (*)    Bacteria, UA MANY (*)    Squamous Epithelial / LPF 0-5 (*)    All other components within normal limits  BRAIN NATRIURETIC PEPTIDE - Abnormal; Notable for the following:    B Natriuretic Peptide 109.9 (*)    All other components within normal limits  CULTURE, BLOOD (ROUTINE X 2)  CULTURE, BLOOD (ROUTINE X 2)  PROTIME-INR  TROPONIN I  I-STAT CG4 LACTIC ACID, ED  I-STAT CG4 LACTIC ACID, ED    EKG  EKG Interpretation  Date/Time:  Saturday June 10 2016 10:35:59 EST Ventricular Rate:  126 PR Interval:    QRS Duration: 86 QT Interval:  291 QTC Calculation: 422 R Axis:   44 Text Interpretation:  Sinus tachycardia Ventricular premature complex Low voltage, precordial leads Confirmed by Alvino Chapel  MD, Marina Boerner [phone]) on 06/10/2016 1:24:50 PM       Radiology Dg Chest 2 View  Result Date:  06/10/2016 CLINICAL DATA:  Dyspnea EXAM: CHEST  2 VIEW COMPARISON:  02/17/2016 chest radiograph. FINDINGS: Stable cardiomediastinal silhouette with normal heart size. No pneumothorax. Small right and small to moderate left pleural effusions, stable on the right and increased on the left. Emphysema. Patchy bibasilar lung opacities, increased on the left and stable on the right. IMPRESSION: 1. Stable small right pleural effusion. Increased small to moderate left pleural effusion. 2. Patchy bibasilar lung opacities, stable on the right and increased on the left, favor atelectasis. 3. Emphysema. Electronically Signed   By: Ilona Sorrel M.D.   On: 06/10/2016 11:07   Ct Chest W Contrast  Result Date: 06/09/2016 CLINICAL DATA:  Is breath for 3-4 months. Pleural effusion. Acute ITP. EXAM: CT CHEST, ABDOMEN, AND PELVIS WITH CONTRAST TECHNIQUE: Multidetector CT imaging of the chest, abdomen and pelvis was performed following the standard protocol during bolus administration of intravenous contrast. CONTRAST:  121mL ISOVUE-300 IOPAMIDOL (ISOVUE-300) INJECTION 61% COMPARISON:  Multiple exams, including chest CT from 05/05/2016 and abdominal CT from 11/22/2008 FINDINGS: CT CHEST FINDINGS Cardiovascular: Coronary, aortic arch, and branch vessel atherosclerotic vascular disease. Mediastinum/Nodes: Part of the patient's face was scanned and shows bilateral maxillary sinusitis. No pathologic adenopathy in the chest. Lungs/Pleura: Large bilateral pleural effusions with passive atelectasis. Nonspecific for transudative versus exudative etiology but the effusions for the most part appear free-flowing. Peripheral interstitial accentuation in both lungs. Probable scarring scarring and slight nodularity scattered in the right upper lobe. Airspace opacity in the right middle lobe. Bilateral airway thickening. Musculoskeletal: Lower cervical and thoracic spondylosis. CT ABDOMEN PELVIS FINDINGS Hepatobiliary: Unremarkable Pancreas:  Unremarkable Spleen: Unremarkable Adrenals/Urinary Tract: Adrenal glands normal. Right kidney upper pole benign-appearing cyst 1.4 cm on image 69/2. Left mid to lower kidney 2.2 cm benign-appearing cyst on image 79/2. Several additional hypodense renal lesions are technically too small to characterize although statistically likely to be cysts. Stomach/Bowel: Descending colon and proximal sigmoid colon diverticulosis. No active diverticulitis identified. Vascular/Lymphatic: Aortoiliac atherosclerotic vascular  disease. No adenopathy identified. Reproductive: Several punctate calcifications in the central gland. Other: Trace ascites along the left paracolic gutter Musculoskeletal: Degenerative arthropathy of both hips. Lumbar spondylosis and degenerative disc disease. IMPRESSION: 1. No adenopathy or mass is identified in the chest, abdomen, or pelvis. 2. Large bilateral pleural effusions with passive atelectasis. There is some scattered scarring and nodularity in both lungs along with peripheral interstitial accentuation which may reflect mild fibrosis. 3. Trace ascites in the left paracolic gutter. 4. Airway thickening is present, suggesting bronchitis or reactive airways disease. 5. Other imaging findings of potential clinical significance: Coronary, aortic arch, and branch vessel atherosclerotic vascular disease. Bilateral chronic appearing maxillary sinusitis. Cervical, thoracic, and lumbar spondylosis. Degenerative arthropathy of both hips. Bilateral renal cysts. Descending and proximal sigmoid colon diverticulosis. Aortoiliac atherosclerotic vascular disease. Electronically Signed   By: Van Clines M.D.   On: 06/09/2016 16:29   Ct Abdomen Pelvis W Contrast  Result Date: 06/09/2016 CLINICAL DATA:  Is breath for 3-4 months. Pleural effusion. Acute ITP. EXAM: CT CHEST, ABDOMEN, AND PELVIS WITH CONTRAST TECHNIQUE: Multidetector CT imaging of the chest, abdomen and pelvis was performed following the standard  protocol during bolus administration of intravenous contrast. CONTRAST:  147mL ISOVUE-300 IOPAMIDOL (ISOVUE-300) INJECTION 61% COMPARISON:  Multiple exams, including chest CT from 05/05/2016 and abdominal CT from 11/22/2008 FINDINGS: CT CHEST FINDINGS Cardiovascular: Coronary, aortic arch, and branch vessel atherosclerotic vascular disease. Mediastinum/Nodes: Part of the patient's face was scanned and shows bilateral maxillary sinusitis. No pathologic adenopathy in the chest. Lungs/Pleura: Large bilateral pleural effusions with passive atelectasis. Nonspecific for transudative versus exudative etiology but the effusions for the most part appear free-flowing. Peripheral interstitial accentuation in both lungs. Probable scarring scarring and slight nodularity scattered in the right upper lobe. Airspace opacity in the right middle lobe. Bilateral airway thickening. Musculoskeletal: Lower cervical and thoracic spondylosis. CT ABDOMEN PELVIS FINDINGS Hepatobiliary: Unremarkable Pancreas: Unremarkable Spleen: Unremarkable Adrenals/Urinary Tract: Adrenal glands normal. Right kidney upper pole benign-appearing cyst 1.4 cm on image 69/2. Left mid to lower kidney 2.2 cm benign-appearing cyst on image 79/2. Several additional hypodense renal lesions are technically too small to characterize although statistically likely to be cysts. Stomach/Bowel: Descending colon and proximal sigmoid colon diverticulosis. No active diverticulitis identified. Vascular/Lymphatic: Aortoiliac atherosclerotic vascular disease. No adenopathy identified. Reproductive: Several punctate calcifications in the central gland. Other: Trace ascites along the left paracolic gutter Musculoskeletal: Degenerative arthropathy of both hips. Lumbar spondylosis and degenerative disc disease. IMPRESSION: 1. No adenopathy or mass is identified in the chest, abdomen, or pelvis. 2. Large bilateral pleural effusions with passive atelectasis. There is some scattered  scarring and nodularity in both lungs along with peripheral interstitial accentuation which may reflect mild fibrosis. 3. Trace ascites in the left paracolic gutter. 4. Airway thickening is present, suggesting bronchitis or reactive airways disease. 5. Other imaging findings of potential clinical significance: Coronary, aortic arch, and branch vessel atherosclerotic vascular disease. Bilateral chronic appearing maxillary sinusitis. Cervical, thoracic, and lumbar spondylosis. Degenerative arthropathy of both hips. Bilateral renal cysts. Descending and proximal sigmoid colon diverticulosis. Aortoiliac atherosclerotic vascular disease. Electronically Signed   By: Van Clines M.D.   On: 06/09/2016 16:29    Procedures Procedures (including critical care time)  Medications Ordered in ED Medications - No data to display   Initial Impression / Assessment and Plan / ED Course  I have reviewed the triage vital signs and the nursing notes.  Pertinent labs & imaging results that were available during my care of the patient  were reviewed by me and considered in my medical decision making (see chart for details).     Patient presents with worsening shortness of breath. History of bronchiectasis and ITP. Platelets are less than 5 which appears to be at the baseline for him now. Chest CT done yesterday does show bilateral moderate pleural effusions. This could be contributing to his increasing dyspnea. Has not had increased sputum production or fevers. Possible dysuria. Normal lactic acid. Had did have one episode of an SVT while he was in the ER with heart rates up to about 185. Likely multifocal atrial tachycardia versus SVT. He converted back to sinus rhythm spontaneously. Oxygen level maintained on nasal cannula. Will admit to internal medicine.  Final Clinical Impressions(s) / ED Diagnoses   Final diagnoses:  Pleural effusion  Bronchiectasis with acute exacerbation (HCC)  ITP secondary to  infection    New Prescriptions New Prescriptions   No medications on file     Davonna Belling, MD 06/10/16 1335

## 2016-06-10 NOTE — H&P (Signed)
History and Physical    [name] [id number] DOB: 1941/02/01  DOA: 06/10/2016 PCP: [name], MD  Patient coming from: Home  Chief Complaint: SOB  HPI: [name] is a 76 y.o. male with medical history significant of recently diagnosed with ITP, bronchiectasis, hypertension, diabetes mellitus type 2. Presented to the ED complaining of shortness of breath for the past week has been worsening with no improvement after inhaler therapies. Patient was seen by his hematologist yesterday ordered a CT scan which showed increasing pleural effusion from previous studies. Since seen by pulmonologist, who has him on prednisone taper and multiple inhalers. Hematology giving him IVIG infusions with poor response.  ED Course: Severe short of breath no really hypoxic but needing oxygen for comfort. Platelet of 5, CT shows a large right pleural effusion. WBC 16.2, SVT up to 150. Converted with no intervention. Patient to be admitted to step down, for aggressive diuresis and further monitoring.  Review of Systems:   General:  ositive for generalized weakness. no changes in body weight, no fever or chills. HEENT: no blurry vision, hearing changes or sore throat Respiratory:  see history of present illness  CV: no chest pain, no palpitations GI: no nausea, vomiting, abdominal pain, diarrhea, constipation GU: no dysuria, burning on urination, increased urinary frequency, hematuria  Ext:. No deformities,  Neuro: no unilateral weakness, numbness, or tingling, no vision change or hearing loss Skin: No rashes, lesions or wounds. MSK: No muscle spasm, no deformity, no limitation of range of movement in spin Heme: No easy bruising.    Past Medical History:  Diagnosis Date  . Adenomatous colon polyp    01/2006  . Chronic pansinusitis   . Diabetes mellitus   . Diverticula of colon   . Hyperlipemia    under control now with meds  . Kidney stones    Stones in past  . Sinus drainage    Cough with post nasal drip    Past Surgical History:  Procedure Laterality Date  . COLONOSCOPY    . ESOPHAGOGASTRODUODENOSCOPY    . ESOPHAGOGASTRODUODENOSCOPY  05/19/2011   Procedure: ESOPHAGOGASTRODUODENOSCOPY (EGD);  Surgeon: Wonda Horner, MD;  Location: [facility];  Service: Endoscopy;  Laterality: N/A;  stat CBC  APC also needed  . HOT HEMOSTASIS  05/19/2011   Procedure: HOT HEMOSTASIS (ARGON PLASMA COAGULATION/BICAP);  Surgeon: Wonda Horner, MD;  Location: [facility];  Service: Endoscopy;  Laterality: N/A;  . spinal cyst       reports that he quit smoking about 15 years ago. His smoking use included Cigarettes. He started smoking about 57 years ago. He has a 42.00 pack-year smoking history. He has never used smokeless tobacco. He reports that he drinks alcohol. He reports that he does not use drugs.  No Known Allergies  Family History  Problem Relation Age of Onset  . Colon cancer Mother   . Heart disease Mother   . Liver cancer Father   . Heart disease Father    Family history reviewed and non-pertinent  Prior to Admission medications   Medication Sig Start Date End Date Taking? Authorizing Provider  albuterol (PROAIR HFA) 108 (90 Base) MCG/ACT inhaler 2 puffs every 4 hours as needed only  if your can't catch your breath 04/04/16  Yes Tanda Rockers, MD  aminocaproic acid (AMICAR) 500 MG tablet Take 2 tablets (1,000 mg total) by mouth every 8 (eight) hours. As Needed 05/24/16  Yes Nicholas Lose, MD  dextromethorphan-guaiFENesin [facility] DM) 30-600 MG 12hr tablet Take  1 tablet by mouth 2 (two) times daily as needed for cough.   Yes Historical Provider, MD  ECHINACEA EXTRACT PO Take 1 capsule by mouth daily.   Yes Historical Provider, MD  famotidine (PEPCID) 20 MG tablet One at bedtime 02/02/16  Yes Tanda Rockers, MD  furosemide (LASIX) 20 MG tablet Take 20 mg by mouth daily. 04/27/16  Yes Historical Provider, MD  GINKGO BILOBA PO Take 1 tablet by mouth daily.    Yes  Historical Provider, MD  Glucosamine-Chondroit-Vit C-Mn (GLUCOSAMINE 1500 COMPLEX PO) Take 1,500 mg by mouth 2 (two) times daily.   Yes Historical Provider, MD  Insulin Glargine (TOUJEO SOLOSTAR) 300 UNIT/ML SOPN Inject 30 Units into the skin daily. 05/11/16  Yes Historical Provider, MD  loratadine (CLARITIN) 10 MG tablet Take 10 mg by mouth daily.   Yes Historical Provider, MD  losartan (COZAAR) 50 MG tablet Take 1 tablet (50 mg total) by mouth daily. 03/26/12  Yes Tanda Rockers, MD  metFORMIN (GLUCOPHAGE) 1000 MG tablet Take 1,000 mg by mouth 2 (two) times daily with a meal.   Yes Historical Provider, MD  Multiple Vitamins-Minerals (MULTIVITAMIN PO) Take by mouth 1 day or 1 dose.   Yes Historical Provider, MD  pantoprazole (PROTONIX) 40 MG tablet Take 1 tablet (40 mg total) by mouth daily. Take 30-60 min before first meal of the day 05/03/16  Yes Tanda Rockers, MD  predniSONE (DELTASONE) 20 MG tablet Take 4.5 tablets (90 mg total) by mouth daily with breakfast. Patient taking differently: Take 40 mg by mouth daily with breakfast.  05/29/16  Yes Nicholas Lose, MD  simvastatin (ZOCOR) 80 MG tablet Take 80 mg by mouth daily.    Yes Historical Provider, MD  Respiratory Therapy Supplies (FLUTTER) DEVI Use as directed 02/02/16   Tanda Rockers, MD    Physical Exam: Vitals:   06/10/16 1130 06/10/16 1415 06/10/16 1430 06/10/16 1500  BP: (!) 102/52 103/60 97/61 92/59   Pulse: (!) 121 110 113 112  Resp: 23 24 24  (!) 31  Temp:      TempSrc:      SpO2: 98% 99% 99% 100%  Weight:      Height:         Constitutional: Moderate distress, difficulty speaking in full sentences Eyes: PERRL ENMT: Mucous membranes are moist.  Neck: normal, supple, no masses, no thyromegaly Respiratory: Decreased breath sounds throughout more prominent at the left side. Course sounds with crackles at the bases. Upper lobes with respiratory wheezing bilaterally. Cardiovascular: Tachycardia, no murmurs / rubs / gallops. 2+  pitting edema lower extremity extending up to the knees. 2+ pedal pulses. No carotid bruits.  Abdomen: no tenderness, no masses palpated. No hepatosplenomegaly. Bowel sounds positive.  Musculoskeletal: no clubbing / cyanosis.  Skin: no rashes, lesions, ulcers. Neurologic: CN 2-12 grossly intact. Strength 5/5 in all 4.  Psychiatric: Normal judgment and insight. Alert and oriented x 3. Anxiety    Labs on Admission: I have personally reviewed following labs and imaging studies  CBC:  Recent Labs Lab 06/06/16 1101 06/10/16 1107  WBC 13.3* 16.2*  NEUTROABS 11.0* 15.2*  HGB 10.7* 10.4*  HCT 34.7* 32.6*  MCV 94.3 91.3  PLT 3* <5*   Basic Metabolic Panel:  Recent Labs Lab 06/06/16 1101 06/10/16 1107  NA 136 133*  K 3.7 3.9  CL  --  93*  CO2 32* 31  GLUCOSE 225* 240*  BUN 24.5 22*  CREATININE 0.8 0.88  CALCIUM 8.9 8.3*  GFR: Estimated Creatinine Clearance: 87 mL/min (by C-G formula based on SCr of 0.88 mg/dL). Liver Function Tests:  Recent Labs Lab 06/06/16 1101 06/10/16 1107  AST 14 16  ALT 18 15*  ALKPHOS 71 62  BILITOT 0.56 0.5  PROT 6.7 6.9  ALBUMIN 2.4* 2.7*   No results for input(s): LIPASE, AMYLASE in the last 168 hours. No results for input(s): AMMONIA in the last 168 hours. Coagulation Profile:  Recent Labs Lab 06/10/16 1107  INR 1.13   Cardiac Enzymes:  Recent Labs Lab 06/10/16 1107  TROPONINI <0.03   BNP (last 3 results)  Recent Labs  02/17/16 0948 05/05/16 1450  PROBNP 40.0 17.0   Urine analysis:    Component Value Date/Time   COLORURINE YELLOW 06/10/2016 1155   APPEARANCEUR CLEAR 06/10/2016 1155   LABSPEC 1.010 06/10/2016 1155   PHURINE 5.0 06/10/2016 1155   GLUCOSEU NEGATIVE 06/10/2016 1155   HGBUR MODERATE (A) 06/10/2016 1155   BILIRUBINUR NEGATIVE 06/10/2016 1155   KETONESUR NEGATIVE 06/10/2016 1155   PROTEINUR NEGATIVE 06/10/2016 1155   UROBILINOGEN 1.0 11/22/2008 1922   NITRITE NEGATIVE 06/10/2016 1155   LEUKOCYTESUR  LARGE (A) 06/10/2016 1155   Radiological Exams on Admission: Dg Chest 2 View  Result Date: 06/10/2016 CLINICAL DATA:  Dyspnea EXAM: CHEST  2 VIEW COMPARISON:  02/17/2016 chest radiograph. FINDINGS: Stable cardiomediastinal silhouette with normal heart size. No pneumothorax. Small right and small to moderate left pleural effusions, stable on the right and increased on the left. Emphysema. Patchy bibasilar lung opacities, increased on the left and stable on the right. IMPRESSION: 1. Stable small right pleural effusion. Increased small to moderate left pleural effusion. 2. Patchy bibasilar lung opacities, stable on the right and increased on the left, favor atelectasis. 3. Emphysema. Electronically Signed   By: Ilona Sorrel M.D.   On: 06/10/2016 11:07   Ct Chest W Contrast  Result Date: 06/09/2016 CLINICAL DATA:  Is breath for 3-4 months. Pleural effusion. Acute ITP. EXAM: CT CHEST, ABDOMEN, AND PELVIS WITH CONTRAST TECHNIQUE: Multidetector CT imaging of the chest, abdomen and pelvis was performed following the standard protocol during bolus administration of intravenous contrast. CONTRAST:  115mL ISOVUE-300 IOPAMIDOL (ISOVUE-300) INJECTION 61% COMPARISON:  Multiple exams, including chest CT from 05/05/2016 and abdominal CT from 11/22/2008 FINDINGS: CT CHEST FINDINGS Cardiovascular: Coronary, aortic arch, and branch vessel atherosclerotic vascular disease. Mediastinum/Nodes: Part of the patient's face was scanned and shows bilateral maxillary sinusitis. No pathologic adenopathy in the chest. Lungs/Pleura: Large bilateral pleural effusions with passive atelectasis. Nonspecific for transudative versus exudative etiology but the effusions for the most part appear free-flowing. Peripheral interstitial accentuation in both lungs. Probable scarring scarring and slight nodularity scattered in the right upper lobe. Airspace opacity in the right middle lobe. Bilateral airway thickening. Musculoskeletal: Lower cervical  and thoracic spondylosis. CT ABDOMEN PELVIS FINDINGS Hepatobiliary: Unremarkable Pancreas: Unremarkable Spleen: Unremarkable Adrenals/Urinary Tract: Adrenal glands normal. Right kidney upper pole benign-appearing cyst 1.4 cm on image 69/2. Left mid to lower kidney 2.2 cm benign-appearing cyst on image 79/2. Several additional hypodense renal lesions are technically too small to characterize although statistically likely to be cysts. Stomach/Bowel: Descending colon and proximal sigmoid colon diverticulosis. No active diverticulitis identified. Vascular/Lymphatic: Aortoiliac atherosclerotic vascular disease. No adenopathy identified. Reproductive: Several punctate calcifications in the central gland. Other: Trace ascites along the left paracolic gutter Musculoskeletal: Degenerative arthropathy of both hips. Lumbar spondylosis and degenerative disc disease. IMPRESSION: 1. No adenopathy or mass is identified in the chest, abdomen, or pelvis. 2. Large  bilateral pleural effusions with passive atelectasis. There is some scattered scarring and nodularity in both lungs along with peripheral interstitial accentuation which may reflect mild fibrosis. 3. Trace ascites in the left paracolic gutter. 4. Airway thickening is present, suggesting bronchitis or reactive airways disease. 5. Other imaging findings of potential clinical significance: Coronary, aortic arch, and branch vessel atherosclerotic vascular disease. Bilateral chronic appearing maxillary sinusitis. Cervical, thoracic, and lumbar spondylosis. Degenerative arthropathy of both hips. Bilateral renal cysts. Descending and proximal sigmoid colon diverticulosis. Aortoiliac atherosclerotic vascular disease. Electronically Signed   By: Van Clines M.D.   On: 06/09/2016 16:29   Ct Abdomen Pelvis W Contrast  Result Date: 06/09/2016 CLINICAL DATA:  Is breath for 3-4 months. Pleural effusion. Acute ITP. EXAM: CT CHEST, ABDOMEN, AND PELVIS WITH CONTRAST TECHNIQUE:  Multidetector CT imaging of the chest, abdomen and pelvis was performed following the standard protocol during bolus administration of intravenous contrast. CONTRAST:  163mL ISOVUE-300 IOPAMIDOL (ISOVUE-300) INJECTION 61% COMPARISON:  Multiple exams, including chest CT from 05/05/2016 and abdominal CT from 11/22/2008 FINDINGS: CT CHEST FINDINGS Cardiovascular: Coronary, aortic arch, and branch vessel atherosclerotic vascular disease. Mediastinum/Nodes: Part of the patient's face was scanned and shows bilateral maxillary sinusitis. No pathologic adenopathy in the chest. Lungs/Pleura: Large bilateral pleural effusions with passive atelectasis. Nonspecific for transudative versus exudative etiology but the effusions for the most part appear free-flowing. Peripheral interstitial accentuation in both lungs. Probable scarring scarring and slight nodularity scattered in the right upper lobe. Airspace opacity in the right middle lobe. Bilateral airway thickening. Musculoskeletal: Lower cervical and thoracic spondylosis. CT ABDOMEN PELVIS FINDINGS Hepatobiliary: Unremarkable Pancreas: Unremarkable Spleen: Unremarkable Adrenals/Urinary Tract: Adrenal glands normal. Right kidney upper pole benign-appearing cyst 1.4 cm on image 69/2. Left mid to lower kidney 2.2 cm benign-appearing cyst on image 79/2. Several additional hypodense renal lesions are technically too small to characterize although statistically likely to be cysts. Stomach/Bowel: Descending colon and proximal sigmoid colon diverticulosis. No active diverticulitis identified. Vascular/Lymphatic: Aortoiliac atherosclerotic vascular disease. No adenopathy identified. Reproductive: Several punctate calcifications in the central gland. Other: Trace ascites along the left paracolic gutter Musculoskeletal: Degenerative arthropathy of both hips. Lumbar spondylosis and degenerative disc disease. IMPRESSION: 1. No adenopathy or mass is identified in the chest, abdomen, or  pelvis. 2. Large bilateral pleural effusions with passive atelectasis. There is some scattered scarring and nodularity in both lungs along with peripheral interstitial accentuation which may reflect mild fibrosis. 3. Trace ascites in the left paracolic gutter. 4. Airway thickening is present, suggesting bronchitis or reactive airways disease. 5. Other imaging findings of potential clinical significance: Coronary, aortic arch, and branch vessel atherosclerotic vascular disease. Bilateral chronic appearing maxillary sinusitis. Cervical, thoracic, and lumbar spondylosis. Degenerative arthropathy of both hips. Bilateral renal cysts. Descending and proximal sigmoid colon diverticulosis. Aortoiliac atherosclerotic vascular disease. Electronically Signed   By: Van Clines M.D.   On: 06/09/2016 16:29    EKG: Independently reviewed. Sinus tachycardia at 126 bpm, multiple PVCs. No ST segment changes.  Assessment/Plan Pleural effusion - Discussed with Dr. Ashok Cordia PCCM, patient not candidate to tap at this point given low platelets. Recommended aggressive diuresis. If patient become unstable transfuse platelet and place a chest tube other wise treat with diuretics.  Admitted to step down IV Lasix 80 mg twice a day At this point not concern for infectious process, increasing white count likely related to prednisone use. If develop fever get blood cultures and start empiric antibiotics. Although getting Rocephin for possible UTI Strict I&O's   UTI -  grossly abnormal UA  Agree with rocephin will continue for now  Follow up urine cultures   Bronchiectasis  Continue prednisone 60 mg daily Will start Pulmicort, Brovana and DuoNeb's. Oxygen supplementation as needed.  ITP - Seen by Oncology yesterday Dr. Lindi Chang - unable to speak with him today  Patient has been reciving IVIG infusions Holding Protonix and Pepcid due to thrombocytopenia  Monitor for bleeding   DM Type II  Resume home insuline  Hold  metformin  SSI  Monitor CBG's    HTN - stable - initially in the lowe side.  Not on BP meds at home On Lasix and BB  Monitor BP   Tachycardia ? MAT  Add metoprolol 25 mg BID   Monitor tele   DVT prophylaxis: SCD's  Code Status: FULL  Family Communication:  Disposition Plan: Anticipate discharge to previous home environment.  Consults called: Discussed with PCCM, not officially consulted  Admission status: SDU/Inpatient    Chipper Oman MD [facility] Pager: Text Page via [email or web address]  [phone]  If 7PM-7AM, please contact night-coverage [email or web address] Password [facility]  06/10/2016, 3:37 PM

## 2016-06-10 NOTE — Progress Notes (Signed)
Pt frequently in and out of SVT with rates into the 190's. Will return to ST in 110's with PVC's intermittently. MD on call notified. Luther Parody, RN

## 2016-06-10 NOTE — ED Triage Notes (Signed)
Pt reports SOB for the past week that got worse today. Pt being treated at the cancer center for low platelets. Last treatment 3 days ago. No CP. Occasional cough.

## 2016-06-11 ENCOUNTER — Encounter (HOSPITAL_COMMUNITY): Payer: Self-pay | Admitting: Cardiology

## 2016-06-11 ENCOUNTER — Inpatient Hospital Stay (HOSPITAL_COMMUNITY): Payer: Medicare HMO

## 2016-06-11 DIAGNOSIS — D693 Immune thrombocytopenic purpura: Secondary | ICD-10-CM

## 2016-06-11 DIAGNOSIS — Z862 Personal history of diseases of the blood and blood-forming organs and certain disorders involving the immune mechanism: Secondary | ICD-10-CM

## 2016-06-11 DIAGNOSIS — J471 Bronchiectasis with (acute) exacerbation: Secondary | ICD-10-CM

## 2016-06-11 DIAGNOSIS — I471 Supraventricular tachycardia, unspecified: Secondary | ICD-10-CM | POA: Diagnosis present

## 2016-06-11 DIAGNOSIS — D6959 Other secondary thrombocytopenia: Secondary | ICD-10-CM

## 2016-06-11 DIAGNOSIS — I472 Ventricular tachycardia: Secondary | ICD-10-CM

## 2016-06-11 LAB — HEPATIC FUNCTION PANEL
ALK PHOS: 56 U/L (ref 38–126)
ALT: 13 U/L — AB (ref 17–63)
AST: 14 U/L — AB (ref 15–41)
Albumin: 2.4 g/dL — ABNORMAL LOW (ref 3.5–5.0)
BILIRUBIN TOTAL: 0.5 mg/dL (ref 0.3–1.2)
Total Protein: 6.4 g/dL — ABNORMAL LOW (ref 6.5–8.1)

## 2016-06-11 LAB — CBC
HCT: 32.9 % — ABNORMAL LOW (ref 39.0–52.0)
Hemoglobin: 10.1 g/dL — ABNORMAL LOW (ref 13.0–17.0)
MCH: 28.5 pg (ref 26.0–34.0)
MCHC: 30.7 g/dL (ref 30.0–36.0)
MCV: 92.9 fL (ref 78.0–100.0)
Platelets: <5 10*3/uL — CL (ref 150–400)
RBC: 3.54 MIL/uL — ABNORMAL LOW (ref 4.22–5.81)
RDW: 15.8 % — ABNORMAL HIGH (ref 11.5–15.5)
WBC: 14.6 10*3/uL — ABNORMAL HIGH (ref 4.0–10.5)

## 2016-06-11 LAB — BASIC METABOLIC PANEL
Anion gap: 8 (ref 5–15)
BUN: 24 mg/dL — AB (ref 6–20)
CO2: 36 mmol/L — ABNORMAL HIGH (ref 22–32)
CREATININE: 0.95 mg/dL (ref 0.61–1.24)
Calcium: 8.2 mg/dL — ABNORMAL LOW (ref 8.9–10.3)
Chloride: 92 mmol/L — ABNORMAL LOW (ref 101–111)
GFR calc Af Amer: >60 mL/min (ref >60)
GLUCOSE: 177 mg/dL — AB (ref 65–99)
Potassium: 4.2 mmol/L (ref 3.5–5.1)
SODIUM: 136 mmol/L (ref 135–145)

## 2016-06-11 LAB — GLUCOSE, CAPILLARY
GLUCOSE-CAPILLARY: 315 mg/dL — AB (ref 65–99)
GLUCOSE-CAPILLARY: 219 mg/dL — AB (ref 65–99)
Glucose-Capillary: 157 mg/dL — ABNORMAL HIGH (ref 65–99)
Glucose-Capillary: 187 mg/dL — ABNORMAL HIGH (ref 65–99)

## 2016-06-11 LAB — ECHOCARDIOGRAM COMPLETE
HEIGHTINCHES: 71 in
WEIGHTICAEL: 3377 [oz_av]

## 2016-06-11 LAB — TSH: TSH: 2.718 u[IU]/mL (ref 0.350–4.500)

## 2016-06-11 MED ORDER — AMIODARONE IV BOLUS ONLY 150 MG/100ML
150.0000 mg | Freq: Once | INTRAVENOUS | Status: AC
  Administered 2016-06-11: 150 mg via INTRAVENOUS
  Filled 2016-06-11 (×2): qty 100

## 2016-06-11 MED ORDER — FAMOTIDINE 20 MG PO TABS
20.0000 mg | ORAL_TABLET | Freq: Every day | ORAL | Status: DC
  Administered 2016-06-11 – 2016-06-19 (×9): 20 mg via ORAL
  Filled 2016-06-11 (×9): qty 1

## 2016-06-11 MED ORDER — AMIODARONE IV BOLUS ONLY 150 MG/100ML
150.0000 mg | Freq: Once | INTRAVENOUS | Status: AC
  Administered 2016-06-11: 150 mg via INTRAVENOUS
  Filled 2016-06-11: qty 100

## 2016-06-11 MED ORDER — MAGNESIUM SULFATE 4 GM/100ML IV SOLN
4.0000 g | Freq: Once | INTRAVENOUS | Status: AC
  Administered 2016-06-11: 4 g via INTRAVENOUS
  Filled 2016-06-11: qty 100

## 2016-06-11 MED ORDER — PERFLUTREN LIPID MICROSPHERE
1.0000 mL | INTRAVENOUS | Status: AC | PRN
  Administered 2016-06-11: 2 mL via INTRAVENOUS
  Filled 2016-06-11: qty 10

## 2016-06-11 MED ORDER — AMIODARONE LOAD VIA INFUSION: 150.0000 mg | Freq: Once | INTRAVENOUS | Status: DC

## 2016-06-11 MED ORDER — AMIODARONE HCL IN DEXTROSE 360-4.14 MG/200ML-% IV SOLN
30.0000 mg/h | INTRAVENOUS | Status: DC
  Administered 2016-06-11 – 2016-06-14 (×6): 30 mg/h via INTRAVENOUS
  Filled 2016-06-11 (×6): qty 200

## 2016-06-11 MED ORDER — ORAL CARE MOUTH RINSE
15.0000 mL | Freq: Two times a day (BID) | OROMUCOSAL | Status: DC
  Administered 2016-06-12 – 2016-06-18 (×8): 15 mL via OROMUCOSAL

## 2016-06-11 MED ORDER — AMIODARONE HCL IN DEXTROSE 360-4.14 MG/200ML-% IV SOLN
60.0000 mg/h | INTRAVENOUS | Status: AC
  Administered 2016-06-11: 60 mg/h via INTRAVENOUS
  Filled 2016-06-11: qty 200

## 2016-06-11 NOTE — Consult Note (Signed)
Requesting provider: Dr. Cristal Chang Consulting cardiologist: [name]  Reason for consultation: SVT  Clinical Summary [name] is a 76 y.o.male with past medical history outlined below, admitted to the hospital yesterday complaining of worsening shortness of breath and weakness. He has a history of ITP status post treatment with IVIG and also Rituxan. He follows with [name]. Recent chest CT was obtained with findings of large bilateral pleural effusions. He reports chest congestion and cough, no fevers or chills. He was admitted for diuresis, felt not to be a candidate for thoracentesis at this time due to current level of thrombocytopenia.  Since yesterday he has been experiencing recurring episodes of SVT with heart rate up into the 180s to 190s. He has had associated hypotension with some of these episodes, but at times has been asymptomatic. He does not endorse any palpitations or chest pain at baseline. Has no prior history of cardiac arrhythmia. So far he has been treated with beta blocker therapy. Adenosine ordered at one point by covering provider, not given. This morning he is noted to be in sinus tachycardia.  No Known Allergies  Medications Scheduled Medications: . arformoterol  15 mcg Nebulization BID  . budesonide (PULMICORT) nebulizer solution  0.25 mg Nebulization BID  . furosemide  80 mg Intravenous BID  . insulin aspart  0-15 Units Subcutaneous TID WC  . insulin glargine  30 Units Subcutaneous QHS  . ipratropium  0.5 mg Nebulization TID  . metoprolol tartrate  25 mg Oral BID  . predniSONE  60 mg Oral Q breakfast    PRN Medications: adenosine (ADENOCARD) IV, levalbuterol   Past Medical History:  Diagnosis Date  . Adenomatous colon polyp    01/2006  . Chronic pansinusitis   . Diverticula of colon   . History of ITP   . History of nephrolithiasis   . Hyperlipemia   . Sinus drainage    Cough with post nasal drip  . Type 2 diabetes  mellitus ([place])     Past Surgical History:  Procedure Laterality Date  . COLONOSCOPY    . ESOPHAGOGASTRODUODENOSCOPY    . ESOPHAGOGASTRODUODENOSCOPY  05/19/2011   Procedure: ESOPHAGOGASTRODUODENOSCOPY (EGD);  Surgeon: Wonda Horner, MD;  Location: [facility];  Service: Endoscopy;  Laterality: N/A;  stat CBC  APC also needed  . HOT HEMOSTASIS  05/19/2011   Procedure: HOT HEMOSTASIS (ARGON PLASMA COAGULATION/BICAP);  Surgeon: Wonda Horner, MD;  Location: [facility];  Service: Endoscopy;  Laterality: N/A;  . Spinal cyst resection      Family History  Problem Relation Age of Onset  . Colon cancer Mother   . Heart disease Mother   . Liver cancer Father   . Heart disease Father     Social History [name] reports that he quit smoking about 15 years ago. His smoking use included Cigarettes. He started smoking about 57 years ago. He has a 42.00 pack-year smoking history. He has never used smokeless tobacco. [name] reports that he drinks alcohol.  Review of Systems Complete review of systems negative except as otherwise outlined in the clinical summary and also the following. Progressive fatigue. Recurrent petechia/ecchymoses.  Physical Examination Blood pressure 105/73, pulse (!) 110, temperature 97.7 F (36.5 C), temperature source Oral, resp. rate 18, height 5\' 11"  (1.803 m), weight 211 lb 1 oz (95.7 kg), SpO2 97 %.  Intake/Output Summary (Last 24 hours) at 06/11/16 0809 Last data filed at 06/11/16 0400  Gross per 24 hour  Intake              337 ml  Output             1375 ml  Net            -1038 ml   Telemetry: Currently sinus tachycardia.  Gen.: No distress, chronically ill-appearing. HEENT: Conjunctiva and lids normal, oropharynx clear. Neck: Supple, no elevated JVP or carotid bruits, no thyromegaly. Lungs: Decreased breath sounds bilaterally with upper airway rhonchi and anterior congestion, nonlabored breathing at rest. Cardiac: Regular rate and rhythm, no  S3 gallop, no pericardial rub. Abdomen: Soft, nontender, bowel sounds present, no guarding or rebound. Extremities: 2+ lower leg edema, distal pulses 2+. Skin: Warm and dry. Scattered petechiae and ecchymoses. Musculoskeletal: No kyphosis. Neuropsychiatric: Alert and oriented x3, affect grossly appropriate.  Lab Results  Basic Metabolic Panel:  Recent Labs Lab 06/06/16 1101 06/10/16 1107 06/10/16 2254  NA 136 133*  --   K 3.7 3.9  --   CL  --  93*  --   CO2 32* 31  --   GLUCOSE 225* 240*  --   BUN 24.5 22*  --   CREATININE 0.8 0.88  --   CALCIUM 8.9 8.3*  --   MG  --   --  1.4*    Liver Function Tests:  Recent Labs Lab 06/06/16 1101 06/10/16 1107  AST 14 16  ALT 18 15*  ALKPHOS 71 62  BILITOT 0.56 0.5  PROT 6.7 6.9  ALBUMIN 2.4* 2.7*    CBC:  Recent Labs Lab 06/06/16 1101 06/10/16 1107  WBC 13.3* 16.2*  NEUTROABS 11.0* 15.2*  HGB 10.7* 10.4*  HCT 34.7* 32.6*  MCV 94.3 91.3  PLT 3* <5*    Cardiac Enzymes:  Recent Labs Lab 06/10/16 1107  TROPONINI <0.03    BNP: 109  ECG I personally reviewed the tracing from 06/10/2016 which showed sinus tachycardia with PVC.  Imaging Chest CT 06/09/2016: IMPRESSION: 1. No adenopathy or mass is identified in the chest, abdomen, or pelvis. 2. Large bilateral pleural effusions with passive atelectasis. There is some scattered scarring and nodularity in both lungs along with peripheral interstitial accentuation which may reflect mild fibrosis. 3. Trace ascites in the left paracolic gutter. 4. Airway thickening is present, suggesting bronchitis or reactive airways disease. 5. Other imaging findings of potential clinical significance: Coronary, aortic arch, and branch vessel atherosclerotic vascular disease. Bilateral chronic appearing maxillary sinusitis. Cervical, thoracic, and lumbar spondylosis. Degenerative arthropathy of both hips. Bilateral renal cysts. Descending and proximal sigmoid  colon diverticulosis. Aortoiliac atherosclerotic vascular disease.  Impression  1. Recurring PSVT, some associated hypotension transiently. This has been occurring since admission. He was placed on Lopressor by the primary team. I reviewed the telemetry strips, rhythm is most suggestive of a reentrant SVT such as AVRT or AVNRT, however there are some episodes that are more irregular so cannot exclude some interval atrial fibrillation. He has no previously documented history of arrhythmia.  2. ITP status post treatment with IVIG and also Rituxan. Current platelet count less than 5. He follows with oncology, [name].  3. Large bilateral pleural effusions with worsening shortness of breath.  4. Hypomagnesemia, potassium normal.   Recommendations  Discussed with Dr. Ree Kida and patient. Recommend starting IV amiodarone to help suppress recurring PSVT, particularly in the acute setting as this is very likely to continue to recur. Would follow-up with an echocardiogram to assess cardiac structure and function, but critically in light of  pleural effusions. Replete magnesium.  Satira Chang, M.D., F.A.C.C.

## 2016-06-11 NOTE — Progress Notes (Signed)
PROGRESS NOTE    [name]  [id number] DOB: 1940-10-06 DOA: 06/10/2016 PCP: [name], MD   Chief Complaint  Patient presents with  . chemo patient  . Shortness of Breath    Brief Narrative:  HPI on 06/10/2016 by Dr. Chipper Oman [name] is a 76 y.o. male with medical history significant of recently diagnosed with ITP, bronchiectasis, hypertension, diabetes mellitus type 2. Presented to the ED complaining of shortness of breath for the past week has been worsening with no improvement after inhaler therapies. Patient was seen by his hematologist yesterday ordered a CT scan which showed increasing pleural effusion from previous studies. Since seen by pulmonologist, who has him on prednisone taper and multiple inhalers. Hematology giving him IVIG infusions with poor response. Assessment & Plan   Pleural effusion -CXR showed pleural effusions -CT chest showed large right pleural effusion -Admitting Physician discuss with Dr. Ashok Cordia with PCCM, patient not a candidate for thoracentesis at this point given low platelets. Recommended aggressive diuresis. If patient became unstable, transfuse platelets and placed chest tube. -Placed on Lasix -Monitor intake and output  Sepsis -Patient presented with leukocytosis and tachycardia, tachypnea -Possibly secondary to urinary tract infection -blood and urine cultures pending -Continue to monitor   Nonsustained SVT -Patient has had heart rates in the 180s. -Suspect related to pulmonary etiology  -Echocardiogram ordered -Replacing magnesium -Cardiology consulted appreciated -Started patient on amiodarone drip  Urinary tract infection -Urine culture pending -Continue ceftriaxone  Bronchiectasis -Patient has been on high-dose prednisone in the past -Continue prednisone 60 mg daily -Continue nebs, Pulmicort, Brovana, supplemental oxygen  ITP -Patient follows with Dr. [name], oncology -Has been receiving IVIG  infusions/ Rituxan -Intake to monitor CBC  Diabetes mellitus, type II -Metformin held, continue lantus insulin sliding scale and CBG monitoring  Essential hypertension -BP has been somewhat soft, continue to monitor closely -Patient does not use blood pressure medications at home  DVT Prophylaxis  SCDs  Code Status: Full  Family Communication: None at bedside  Disposition Plan: Admitted. Continue to monitor in stepdown  Consultants Cardiology PCCM, via phone, by admitting physician  Procedures  None  Antibiotics   Anti-infectives    Start     Dose/Rate Route Frequency Ordered Stop   06/10/16 1345  cefTRIAXone (ROCEPHIN) 1 g in dextrose 5 % 50 mL IVPB     1 g 100 mL/hr over 30 Minutes Intravenous  Once 06/10/16 1338 06/10/16 1506      Subjective:   [name] seen and examined today.  Patient is complaining of cough and feeling weak. Denies any chest pain, abdominal pain, nausea or vomiting, diarrhea or constipation, headache or dizziness.    Objective:   Vitals:   06/11/16 0549 06/11/16 0600 06/11/16 0755 06/11/16 0800  BP:  (!) 83/61 105/73 (!) 105/51  Pulse: (!) 191 (!) 180 (!) 110 (!) 109  Resp: (!) 31 20 18  (!) 26  Temp:    97.7 F (36.5 C)  TempSrc:    Oral  SpO2: 98% 97% 97% 100%  Weight:      Height:        Intake/Output Summary (Last 24 hours) at 06/11/16 1135 Last data filed at 06/11/16 0800  Gross per 24 hour  Intake              337 ml  Output             1625 ml  Net            -  1288 ml   Filed Weights   06/10/16 1048 06/10/16 1110  Weight: 95.7 kg (211 lb) 95.7 kg (211 lb 1 oz)    Exam  General: Well developed, well nourished, NAD, appears stated age  HEENT: NCAT, mucous membranes moist.   Cardiovascular: S1 S2 auscultated, regular, tachycardic  Respiratory: Diminished breath sounds, upper airway rhonchi and congestion noted anteriorly  Abdomen: Soft, nontender, nondistended, + bowel sounds  Extremities: warm dry without  cyanosis clubbing. LE edema   Neuro: AAOx3, nonfocal  Skin: No rashes lesions or wound, patient does have scattered petechiae and ecchymosis  Psych: Normal affect and demeanor, pleasant   Data Reviewed: I have personally reviewed following labs and imaging studies  CBC:  Recent Labs Lab 06/06/16 1101 06/10/16 1107  WBC 13.3* 16.2*  NEUTROABS 11.0* 15.2*  HGB 10.7* 10.4*  HCT 34.7* 32.6*  MCV 94.3 91.3  PLT 3* <5*   Basic Metabolic Panel:  Recent Labs Lab 06/06/16 1101 06/10/16 1107 06/10/16 2254  NA 136 133*  --   K 3.7 3.9  --   CL  --  93*  --   CO2 32* 31  --   GLUCOSE 225* 240*  --   BUN 24.5 22*  --   CREATININE 0.8 0.88  --   CALCIUM 8.9 8.3*  --   MG  --   --  1.4*   GFR: Estimated Creatinine Clearance: 87 mL/min (by C-G formula based on SCr of 0.88 mg/dL). Liver Function Tests:  Recent Labs Lab 06/06/16 1101 06/10/16 1107  AST 14 16  ALT 18 15*  ALKPHOS 71 62  BILITOT 0.56 0.5  PROT 6.7 6.9  ALBUMIN 2.4* 2.7*   No results for input(s): LIPASE, AMYLASE in the last 168 hours. No results for input(s): AMMONIA in the last 168 hours. Coagulation Profile:  Recent Labs Lab 06/10/16 1107  INR 1.13   Cardiac Enzymes:  Recent Labs Lab 06/10/16 1107  TROPONINI <0.03   BNP (last 3 results)  Recent Labs  02/17/16 0948 05/05/16 1450  PROBNP 40.0 17.0   HbA1C: No results for input(s): HGBA1C in the last 72 hours. CBG:  Recent Labs Lab 06/10/16 2122 06/11/16 0823 06/11/16 1123  GLUCAP 285* 157* 187*   Lipid Profile: No results for input(s): CHOL, HDL, LDLCALC, TRIG, CHOLHDL, LDLDIRECT in the last 72 hours. Thyroid Function Tests: No results for input(s): TSH, T4TOTAL, FREET4, T3FREE, THYROIDAB in the last 72 hours. Anemia Panel: No results for input(s): VITAMINB12, FOLATE, FERRITIN, TIBC, IRON, RETICCTPCT in the last 72 hours. Urine analysis:    Component Value Date/Time   COLORURINE YELLOW 06/10/2016 1155   APPEARANCEUR  CLEAR 06/10/2016 1155   LABSPEC 1.010 06/10/2016 1155   PHURINE 5.0 06/10/2016 1155   GLUCOSEU NEGATIVE 06/10/2016 1155   HGBUR MODERATE (A) 06/10/2016 1155   BILIRUBINUR NEGATIVE 06/10/2016 1155   KETONESUR NEGATIVE 06/10/2016 1155   PROTEINUR NEGATIVE 06/10/2016 1155   UROBILINOGEN 1.0 11/22/2008 1922   NITRITE NEGATIVE 06/10/2016 1155   LEUKOCYTESUR LARGE (A) 06/10/2016 1155   Sepsis Labs: @LABRCNTIP (procalcitonin:4,lacticidven:4)  ) Recent Results (from the past 240 hour(s))  MRSA PCR Screening     Status: None   Collection Time: 06/10/16  4:49 PM  Result Value Ref Range Status   MRSA by PCR NEGATIVE NEGATIVE Final    Comment:        The GeneXpert MRSA Assay (FDA approved for NASAL specimens only), is one component of a comprehensive MRSA colonization surveillance program. It is not intended  to diagnose MRSA infection nor to guide or monitor treatment for MRSA infections.       Radiology Studies: Dg Chest 2 View  Result Date: 06/10/2016 CLINICAL DATA:  Dyspnea EXAM: CHEST  2 VIEW COMPARISON:  02/17/2016 chest radiograph. FINDINGS: Stable cardiomediastinal silhouette with normal heart size. No pneumothorax. Small right and small to moderate left pleural effusions, stable on the right and increased on the left. Emphysema. Patchy bibasilar lung opacities, increased on the left and stable on the right. IMPRESSION: 1. Stable small right pleural effusion. Increased small to moderate left pleural effusion. 2. Patchy bibasilar lung opacities, stable on the right and increased on the left, favor atelectasis. 3. Emphysema. Electronically Signed   By: Ilona Sorrel M.D.   On: 06/10/2016 11:07   Ct Chest W Contrast  Result Date: 06/09/2016 CLINICAL DATA:  Is breath for 3-4 months. Pleural effusion. Acute ITP. EXAM: CT CHEST, ABDOMEN, AND PELVIS WITH CONTRAST TECHNIQUE: Multidetector CT imaging of the chest, abdomen and pelvis was performed following the standard protocol during bolus  administration of intravenous contrast. CONTRAST:  141mL ISOVUE-300 IOPAMIDOL (ISOVUE-300) INJECTION 61% COMPARISON:  Multiple exams, including chest CT from 05/05/2016 and abdominal CT from 11/22/2008 FINDINGS: CT CHEST FINDINGS Cardiovascular: Coronary, aortic arch, and branch vessel atherosclerotic vascular disease. Mediastinum/Nodes: Part of the patient's face was scanned and shows bilateral maxillary sinusitis. No pathologic adenopathy in the chest. Lungs/Pleura: Large bilateral pleural effusions with passive atelectasis. Nonspecific for transudative versus exudative etiology but the effusions for the most part appear free-flowing. Peripheral interstitial accentuation in both lungs. Probable scarring scarring and slight nodularity scattered in the right upper lobe. Airspace opacity in the right middle lobe. Bilateral airway thickening. Musculoskeletal: Lower cervical and thoracic spondylosis. CT ABDOMEN PELVIS FINDINGS Hepatobiliary: Unremarkable Pancreas: Unremarkable Spleen: Unremarkable Adrenals/Urinary Tract: Adrenal glands normal. Right kidney upper pole benign-appearing cyst 1.4 cm on image 69/2. Left mid to lower kidney 2.2 cm benign-appearing cyst on image 79/2. Several additional hypodense renal lesions are technically too small to characterize although statistically likely to be cysts. Stomach/Bowel: Descending colon and proximal sigmoid colon diverticulosis. No active diverticulitis identified. Vascular/Lymphatic: Aortoiliac atherosclerotic vascular disease. No adenopathy identified. Reproductive: Several punctate calcifications in the central gland. Other: Trace ascites along the left paracolic gutter Musculoskeletal: Degenerative arthropathy of both hips. Lumbar spondylosis and degenerative disc disease. IMPRESSION: 1. No adenopathy or mass is identified in the chest, abdomen, or pelvis. 2. Large bilateral pleural effusions with passive atelectasis. There is some scattered scarring and nodularity  in both lungs along with peripheral interstitial accentuation which may reflect mild fibrosis. 3. Trace ascites in the left paracolic gutter. 4. Airway thickening is present, suggesting bronchitis or reactive airways disease. 5. Other imaging findings of potential clinical significance: Coronary, aortic arch, and branch vessel atherosclerotic vascular disease. Bilateral chronic appearing maxillary sinusitis. Cervical, thoracic, and lumbar spondylosis. Degenerative arthropathy of both hips. Bilateral renal cysts. Descending and proximal sigmoid colon diverticulosis. Aortoiliac atherosclerotic vascular disease. Electronically Signed   By: Van Clines M.D.   On: 06/09/2016 16:29   Ct Abdomen Pelvis W Contrast  Result Date: 06/09/2016 CLINICAL DATA:  Is breath for 3-4 months. Pleural effusion. Acute ITP. EXAM: CT CHEST, ABDOMEN, AND PELVIS WITH CONTRAST TECHNIQUE: Multidetector CT imaging of the chest, abdomen and pelvis was performed following the standard protocol during bolus administration of intravenous contrast. CONTRAST:  143mL ISOVUE-300 IOPAMIDOL (ISOVUE-300) INJECTION 61% COMPARISON:  Multiple exams, including chest CT from 05/05/2016 and abdominal CT from 11/22/2008 FINDINGS: CT CHEST FINDINGS Cardiovascular:  Coronary, aortic arch, and branch vessel atherosclerotic vascular disease. Mediastinum/Nodes: Part of the patient's face was scanned and shows bilateral maxillary sinusitis. No pathologic adenopathy in the chest. Lungs/Pleura: Large bilateral pleural effusions with passive atelectasis. Nonspecific for transudative versus exudative etiology but the effusions for the most part appear free-flowing. Peripheral interstitial accentuation in both lungs. Probable scarring scarring and slight nodularity scattered in the right upper lobe. Airspace opacity in the right middle lobe. Bilateral airway thickening. Musculoskeletal: Lower cervical and thoracic spondylosis. CT ABDOMEN PELVIS FINDINGS  Hepatobiliary: Unremarkable Pancreas: Unremarkable Spleen: Unremarkable Adrenals/Urinary Tract: Adrenal glands normal. Right kidney upper pole benign-appearing cyst 1.4 cm on image 69/2. Left mid to lower kidney 2.2 cm benign-appearing cyst on image 79/2. Several additional hypodense renal lesions are technically too small to characterize although statistically likely to be cysts. Stomach/Bowel: Descending colon and proximal sigmoid colon diverticulosis. No active diverticulitis identified. Vascular/Lymphatic: Aortoiliac atherosclerotic vascular disease. No adenopathy identified. Reproductive: Several punctate calcifications in the central gland. Other: Trace ascites along the left paracolic gutter Musculoskeletal: Degenerative arthropathy of both hips. Lumbar spondylosis and degenerative disc disease. IMPRESSION: 1. No adenopathy or mass is identified in the chest, abdomen, or pelvis. 2. Large bilateral pleural effusions with passive atelectasis. There is some scattered scarring and nodularity in both lungs along with peripheral interstitial accentuation which may reflect mild fibrosis. 3. Trace ascites in the left paracolic gutter. 4. Airway thickening is present, suggesting bronchitis or reactive airways disease. 5. Other imaging findings of potential clinical significance: Coronary, aortic arch, and branch vessel atherosclerotic vascular disease. Bilateral chronic appearing maxillary sinusitis. Cervical, thoracic, and lumbar spondylosis. Degenerative arthropathy of both hips. Bilateral renal cysts. Descending and proximal sigmoid colon diverticulosis. Aortoiliac atherosclerotic vascular disease. Electronically Signed   By: Van Clines M.D.   On: 06/09/2016 16:29     Scheduled Meds: . arformoterol  15 mcg Nebulization BID  . budesonide (PULMICORT) nebulizer solution  0.25 mg Nebulization BID  . furosemide  80 mg Intravenous BID  . insulin aspart  0-15 Units Subcutaneous TID WC  . insulin glargine   30 Units Subcutaneous QHS  . ipratropium  0.5 mg Nebulization TID  . metoprolol tartrate  25 mg Oral BID  . predniSONE  60 mg Oral Q breakfast   Continuous Infusions: . amiodarone 60 mg/hr (06/11/16 1023)   Followed by  . amiodarone       LOS: 1 day   Time Spent in minutes   30 minutes  Inioluwa Baris D.O. on 06/11/2016 at 11:35 AM  Between 7am to 7pm - Pager - [phone]  After 7pm go to [email or web address] - password [email or web address]  And look for the night coverage person covering for me after hours  [facility] Office  [phone]

## 2016-06-11 NOTE — Progress Notes (Signed)
Pt had a 50min run of SVT; During the episode this RN called cardiology who suggested this RN give adenosine. An MD has to be at bedside to give adenosine and there was not one readily available. The patient returned to normal sinus rhythm before an MD could come.  After this patient continued to have spurts of SVT that lasted 20-30 minutes. This RN called cardiology again, and they ordered a BMP and Mg labs; Cardiologist also instructed RN to give adenosine if the SVT lasted longer than 30-45 minutes.  Will continue to monitor.

## 2016-06-11 NOTE — Progress Notes (Signed)
Patient in and out of tachyarrhythmia throughout the night with rates into the 190's. Patient is alert or easily aroused and oriented x4 during these episodes and denies pain or tightness in chest. TRH MD and CCM MD aware. Will continue to monitor. S.Lucy Boardman, RN

## 2016-06-11 NOTE — Progress Notes (Signed)
  Echocardiogram 2D Echocardiogram with Definity has been performed.  [name] 06/11/2016, 11:50 AM

## 2016-06-11 NOTE — Progress Notes (Signed)
[facility] Progress Note Patient Name: [name] DOB: [birth year] MRN: [id number]   Date of Service  06/11/2016  HPI/Events of Note  tachyarrhythmia with HR periodically in the 180s non-sustained.  Given BB earlier.  Mag of 1.4  eICU Interventions  Plan: 1. Mag replaced 2. BB now 3. Needs cardiology consult     Intervention Category Major Interventions: Arrhythmia - evaluation and management  Dezarai Prew 06/11/2016, 12:02 AM

## 2016-06-11 NOTE — Progress Notes (Signed)
Pt having 5-20 minute runs of SVT despite being on amiodarone.  Informed Dr. Domenic Polite and additional bolus ordered.  See orders.  Irven Baltimore, RN

## 2016-06-12 ENCOUNTER — Inpatient Hospital Stay (HOSPITAL_COMMUNITY): Payer: Medicare HMO

## 2016-06-12 ENCOUNTER — Telehealth: Payer: Self-pay | Admitting: *Deleted

## 2016-06-12 ENCOUNTER — Encounter (HOSPITAL_COMMUNITY): Payer: Self-pay | Admitting: Pulmonary Disease

## 2016-06-12 DIAGNOSIS — A419 Sepsis, unspecified organism: Principal | ICD-10-CM

## 2016-06-12 DIAGNOSIS — J90 Pleural effusion, not elsewhere classified: Secondary | ICD-10-CM

## 2016-06-12 DIAGNOSIS — J449 Chronic obstructive pulmonary disease, unspecified: Secondary | ICD-10-CM

## 2016-06-12 DIAGNOSIS — D6959 Other secondary thrombocytopenia: Secondary | ICD-10-CM

## 2016-06-12 DIAGNOSIS — J471 Bronchiectasis with (acute) exacerbation: Secondary | ICD-10-CM

## 2016-06-12 LAB — BASIC METABOLIC PANEL
Anion gap: 10 (ref 5–15)
Anion gap: 6 (ref 5–15)
BUN: 26 mg/dL — AB (ref 6–20)
BUN: 24 mg/dL — AB (ref 6–20)
CHLORIDE: 92 mmol/L — AB (ref 101–111)
CO2: 35 mmol/L — ABNORMAL HIGH (ref 22–32)
CO2: 39 mmol/L — ABNORMAL HIGH (ref 22–32)
CREATININE: 0.92 mg/dL (ref 0.61–1.24)
CREATININE: 0.82 mg/dL (ref 0.61–1.24)
Calcium: 8.3 mg/dL — ABNORMAL LOW (ref 8.9–10.3)
Calcium: 8.4 mg/dL — ABNORMAL LOW (ref 8.9–10.3)
Chloride: 90 mmol/L — ABNORMAL LOW (ref 101–111)
GFR calc Af Amer: >60 mL/min (ref >60)
GFR calc Af Amer: >60 mL/min (ref >60)
GFR calc non Af Amer: >60 mL/min (ref >60)
GLUCOSE: 129 mg/dL — AB (ref 65–99)
GLUCOSE: 67 mg/dL (ref 65–99)
Potassium: 3.5 mmol/L (ref 3.5–5.1)
Potassium: 3.7 mmol/L (ref 3.5–5.1)
SODIUM: 137 mmol/L (ref 135–145)
Sodium: 135 mmol/L (ref 135–145)

## 2016-06-12 LAB — MAGNESIUM
MAGNESIUM: 1.6 mg/dL — AB (ref 1.7–2.4)
Magnesium: 1.5 mg/dL — ABNORMAL LOW (ref 1.7–2.4)

## 2016-06-12 LAB — GLUCOSE, CAPILLARY
GLUCOSE-CAPILLARY: 78 mg/dL (ref 65–99)
GLUCOSE-CAPILLARY: 307 mg/dL — AB (ref 65–99)
GLUCOSE-CAPILLARY: 253 mg/dL — AB (ref 65–99)
Glucose-Capillary: 41 mg/dL — CL (ref 65–99)
Glucose-Capillary: 82 mg/dL (ref 65–99)
Glucose-Capillary: 191 mg/dL — ABNORMAL HIGH (ref 65–99)
Glucose-Capillary: 181 mg/dL — ABNORMAL HIGH (ref 65–99)
Glucose-Capillary: 53 mg/dL — ABNORMAL LOW (ref 65–99)

## 2016-06-12 LAB — CBC
HCT: 31.2 % — ABNORMAL LOW (ref 39.0–52.0)
Hemoglobin: 9.7 g/dL — ABNORMAL LOW (ref 13.0–17.0)
MCH: 28.7 pg (ref 26.0–34.0)
MCHC: 31.1 g/dL (ref 30.0–36.0)
MCV: 92.3 fL (ref 78.0–100.0)
RBC: 3.38 MIL/uL — ABNORMAL LOW (ref 4.22–5.81)
RDW: 15.5 % (ref 11.5–15.5)
WBC: 19.6 10*3/uL — ABNORMAL HIGH (ref 4.0–10.5)

## 2016-06-12 LAB — LACTIC ACID, PLASMA: Lactic Acid, Venous: 2.3 mmol/L (ref 0.5–1.9)

## 2016-06-12 LAB — ABO/RH: ABO/RH(D): A POS

## 2016-06-12 LAB — PROCALCITONIN

## 2016-06-12 MED ORDER — DEXTROSE 5 % IV SOLN
1.0000 g | Freq: Every day | INTRAVENOUS | Status: DC
  Administered 2016-06-12 – 2016-06-13 (×2): 1 g via INTRAVENOUS
  Filled 2016-06-12 (×3): qty 10

## 2016-06-12 MED ORDER — DILTIAZEM LOAD VIA INFUSION
5.0000 mg | Freq: Once | INTRAVENOUS | Status: AC
  Administered 2016-06-12: 10 mg via INTRAVENOUS
  Filled 2016-06-12: qty 10

## 2016-06-12 MED ORDER — DILTIAZEM HCL-DEXTROSE 100-5 MG/100ML-% IV SOLN (PREMIX)
5.0000 mg/h | INTRAVENOUS | Status: DC
  Administered 2016-06-12: 15 mg/h via INTRAVENOUS
  Administered 2016-06-12: 5 mg/h via INTRAVENOUS
  Administered 2016-06-12 – 2016-06-13 (×3): 15 mg/h via INTRAVENOUS
  Filled 2016-06-12 (×5): qty 100

## 2016-06-12 MED ORDER — SODIUM CHLORIDE 0.9 % IV SOLN
Freq: Once | INTRAVENOUS | Status: AC
  Administered 2016-06-12: 12:00:00 via INTRAVENOUS

## 2016-06-12 MED ORDER — LEVALBUTEROL HCL 0.63 MG/3ML IN NEBU: 0.6300 mg | INHALATION_SOLUTION | Freq: Four times a day (QID) | RESPIRATORY_TRACT | Status: DC | PRN

## 2016-06-12 MED ORDER — FUROSEMIDE 10 MG/ML IJ SOLN
40.0000 mg | Freq: Once | INTRAMUSCULAR | Status: AC
  Administered 2016-06-12: 40 mg via INTRAVENOUS
  Filled 2016-06-12: qty 4

## 2016-06-12 MED ORDER — DILTIAZEM LOAD VIA INFUSION
5.0000 mg | Freq: Four times a day (QID) | INTRAVENOUS | Status: DC | PRN
  Administered 2016-06-12 (×2): 10 mg via INTRAVENOUS
  Filled 2016-06-12: qty 10

## 2016-06-12 MED ORDER — MAGNESIUM SULFATE 2 GM/50ML IV SOLN
2.0000 g | Freq: Once | INTRAVENOUS | Status: AC
  Administered 2016-06-12: 2 g via INTRAVENOUS
  Filled 2016-06-12: qty 50

## 2016-06-12 MED ORDER — PREDNISONE 20 MG PO TABS
40.0000 mg | ORAL_TABLET | Freq: Every day | ORAL | Status: DC
  Administered 2016-06-12: 40 mg via ORAL
  Administered 2016-06-13 – 2016-06-14 (×2): 20 mg via ORAL
  Filled 2016-06-12 (×2): qty 2

## 2016-06-12 MED ORDER — PANTOPRAZOLE SODIUM 40 MG PO TBEC
40.0000 mg | DELAYED_RELEASE_TABLET | Freq: Every day | ORAL | Status: DC
  Administered 2016-06-12 – 2016-06-20 (×9): 40 mg via ORAL
  Filled 2016-06-12 (×9): qty 1

## 2016-06-12 NOTE — Progress Notes (Signed)
The patient has been seen in conjunction with Rosaria Ferries, PAC . All aspects of care have been considered and discussed. The patient has been personally interviewed, examined, and all clinical data has been reviewed.   The patient is very ill with sepsis, bronchiectasis, bilateral large pleural effusions, and ITP with platelet count less than 10,000.  Cardiology consult because of rapid narrow complex tachycardia at times as high as 180 bpm despite initiation of IV amiodarone within the past 24 hours.  Plan to add diltiazem intravenously which will help suppress reentrant AV nodal tachycardia. Will need to monitor BP closely.   Progress Note  Patient Name: [name] Date of Encounter: 06/12/2016  Primary Cardiologist: [facility], Dr Tamala Julian  Subjective   No sx w/ SVT, BP starting to drop with it, no chest pain, breathing minimally better.  Inpatient Medications    Scheduled Meds: . arformoterol  15 mcg Nebulization BID  . budesonide (PULMICORT) nebulizer solution  0.25 mg Nebulization BID  . cefTRIAXone (ROCEPHIN)  IV  1 g Intravenous Daily  . famotidine  20 mg Oral QHS  . furosemide  80 mg Intravenous BID  . insulin aspart  0-15 Units Subcutaneous TID WC  . insulin glargine  30 Units Subcutaneous QHS  . ipratropium  0.5 mg Nebulization TID  . mouth rinse  15 mL Mouth Rinse BID  . predniSONE  40 mg Oral Q breakfast   Continuous Infusions: . amiodarone 30 mg/hr (06/12/16 0800)   PRN Meds: adenosine (ADENOCARD) IV, levalbuterol   Vital Signs    Vitals:   06/12/16 0803 06/12/16 0805 06/12/16 0810 06/12/16 0815  BP:      Pulse: 98 (!) 168  (!) 102  Resp: (!) 24 (!) 32  20  Temp:      TempSrc:      SpO2: 100% 95% 100% 100%  Weight:      Height:        Intake/Output Summary (Last 24 hours) at 06/12/16 0839 Last data filed at 06/12/16 0800  Gross per 24 hour  Intake           587.64 ml  Output             1725 ml  Net         -1137.36 ml   Filed Weights   06/10/16 1048 06/10/16 1110  Weight: 211 lb (95.7 kg) 211 lb 1 oz (95.7 kg)    Telemetry    In/out SVT, more frequently as the morning has progressed - Personally Reviewed  ECG    SR, none while in SVT - Personally Reviewed  Physical Exam   General: Well developed, well nourished, male appearing in no acute distress. Head: Normocephalic, atraumatic.  Neck: Supple without bruits, JVD mildly elevated. Lungs:  Resp regular and unlabored, decreased BS bases w/ rales. Heart: Rapid RRR, S1, S2, no S3, S4, or murmur; no rub. Abdomen: Soft, non-tender, non-distended with normoactive bowel sounds. No hepatomegaly. No rebound/guarding. No obvious abdominal masses. Extremities: No clubbing, cyanosis, 1+ edema. Distal pedal pulses are 2+ bilaterally. Neuro: Alert and oriented X 3. Moves all extremities spontaneously. Psych: Normal affect.  Labs    Chemistry Recent Labs Lab 06/06/16 1101  06/10/16 1107 06/11/16 1044 06/11/16 1200 06/12/16 0024 06/12/16 0422  NA 136  < > 133*  --  136 137 135  K 3.7  < > 3.9  --  4.2 3.5 3.7  CL  --   < > 93*  --  92* 92* 90*  CO2 32*  < > 31  --  36* 39* 35*  GLUCOSE 225*  < > 240*  --  177* 67 129*  BUN 24.5  < > 22*  --  24* 24* 26*  CREATININE 0.8  < > 0.88  --  0.95 0.82 0.92  CALCIUM 8.9  < > 8.3*  --  8.2* 8.4* 8.3*  PROT 6.7  --  6.9 6.4*  --   --   --   ALBUMIN 2.4*  --  2.7* 2.4*  --   --   --   AST 14  --  16 14*  --   --   --   ALT 18  --  15* 13*  --   --   --   ALKPHOS 71  --  62 56  --   --   --   BILITOT 0.56  --  0.5 0.5  --   --   --   GFRNONAA  --   < > >60  --  >60 >60 >60  GFRAA  --   < > >60  --  >60 >60 >60  ANIONGAP 8  < > 9  --  8 6 10   < > = values in this interval not displayed.   Hematology Recent Labs Lab 06/10/16 1107 06/11/16 1200 06/12/16 0422  WBC 16.2* 14.6* 19.6*  RBC 3.57* 3.54* 3.38*  HGB 10.4* 10.1* 9.7*  HCT 32.6* 32.9* 31.2*  MCV 91.3 92.9 92.3  MCH 29.1 28.5 28.7  MCHC 31.9 30.7 31.1  RDW  15.6* 15.8* 15.5  PLT <5* <5* <5*    Cardiac Enzymes Recent Labs Lab 06/10/16 1107  TROPONINI <0.03    BNP Recent Labs Lab 06/10/16 1107  BNP 109.9*     Radiology    Dg Chest 2 View Result Date: 06/10/2016 CLINICAL DATA:  Dyspnea EXAM: CHEST  2 VIEW COMPARISON:  02/17/2016 chest radiograph. FINDINGS: Stable cardiomediastinal silhouette with normal heart size. No pneumothorax. Small right and small to moderate left pleural effusions, stable on the right and increased on the left. Emphysema. Patchy bibasilar lung opacities, increased on the left and stable on the right. IMPRESSION: 1. Stable small right pleural effusion. Increased small to moderate left pleural effusion. 2. Patchy bibasilar lung opacities, stable on the right and increased on the left, favor atelectasis. 3. Emphysema. Electronically Signed   By: Ilona Sorrel M.D.   On: 06/10/2016 11:07    Cardiac Studies   ECHO 06/11/2016 - Left ventricle: The cavity size was normal. There was moderate   concentric hypertrophy. Systolic function was normal. The   estimated ejection fraction was in the range of 55% to 60%. Wall   motion was normal; there were no regional wall motion   abnormalities. Doppler parameters are consistent with abnormal   left ventricular relaxation (grade 1 diastolic dysfunction). - Aortic valve: Trileaflet; moderately thickened, moderately   calcified leaflets. Valve mobility was restricted. - Tricuspid valve: There was mild regurgitation. - Pericardium, extracardiac: There was a small left pleural   effusion with rounded mass like confluence adjacent to LV apical free wall.  Impressions: - PSVT noted during study (180bpm)  Patient Profile     76 y.o. male with a history of ITP (recent dx), HLD, DM, bronchiectasis, was admitted 02/10 w/ SOB, plt <5, pleural effusion. Pt in/out of SVT  Assessment & Plan    1.  Parox SVT (nonsustained ventricular tachycardia) (HCC) - episodes occurring more  frequently this am -  Preceded by PACs, no flutter waves or fib waves seen during transition periods - probably stimulated by the acute illness, effusions - spoke w/ Dr Tamala Julian by phone - continue Amio (may need to repeat boluses to get level higher faster) - use IVF (TEMPORARILY) to get BP up high enough to give Cardizem - Adenosine may also help w/ diagnosis and treatment  Otherwise, per IM, CCM seeing but pt too high risk for t-centesis Active Problems:   Pleural effusion   Bronchiectasis with acute exacerbation ([place])   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

## 2016-06-12 NOTE — Progress Notes (Signed)
PROGRESS NOTE    [name]  [id number] DOB: 1940/12/12 DOA: 06/10/2016 PCP: [name], MD   Chief Complaint  Patient presents with  . chemo patient  . Shortness of Breath    Brief Narrative:  HPI on 06/10/2016 by Dr. Chipper Oman [name] is a 76 y.o. male with medical history significant of recently diagnosed with ITP, bronchiectasis, hypertension, diabetes mellitus type 2. Presented to the ED complaining of shortness of breath for the past week has been worsening with no improvement after inhaler therapies. Patient was seen by his hematologist yesterday ordered a CT scan which showed increasing pleural effusion from previous studies. Since seen by pulmonologist, who has him on prednisone taper and multiple inhalers. Hematology giving him IVIG infusions with poor response. Assessment & Plan   Pleural effusion -CXR showed pleural effusions -CT chest showed large bilateral pleural effusions -Admitting Physician discuss with Dr. Ashok Cordia with PCCM, patient not a candidate for thoracentesis at this point given low platelets. Recommended aggressive diuresis. If patient became unstable, transfuse platelets and placed chest tube. -Placed on Lasix -Monitor intake and output -CXR this morning shows mild improvement in Right pleural effusion -Spoke with IR, plts need to be >50 -Will transfuse 2u platelets. If plts >50, and still in need of thoracentesis will order that at the appropriate time.  Sepsis -Patient presented with leukocytosis and tachycardia, tachypnea -Possibly secondary to urinary tract infection -Blood cultures show no growth -Urine culture pending  -Continue to monitor   PSVT -Patient has had heart rates in the 180s. -Suspect related to pulmonary etiology  -Echocardiogram EF 0000000, grade 1 diastolic dysfunction -Magnesium 1.6, will replace (goal 2) -Potassium 3.7, will replace (goal 4) -Cardiology consulted and appreciated -Started patient on  amiodarone drip  Urinary tract infection -Urine culture pending -Continue ceftriaxone  Bronchiectasis -Patient has been on high-dose prednisone in the past -Continue prednisone 40 mg daily -Continue nebs, Pulmicort, Brovana, supplemental oxygen  ITP -Patient follows with Dr. [name], oncology. Spoke with Dr. [name] this morning. Will transfuse 2 u platelets -Has been receiving IVIG infusions/ Rituxan -Intake to monitor CBC  Diabetes mellitus, type II -Metformin held, continue lantus insulin sliding scale and CBG monitoring  Essential hypertension -BP has been somewhat soft, continue to monitor closely -Patient does not use blood pressure medications at home  DVT Prophylaxis  SCDs  Code Status: Full  Family Communication: None at bedside  Disposition Plan: Admitted. Continue to monitor in stepdown.  Consultants Cardiology PCCM, via phone, by admitting physician Oncology, Dr. [name]  Procedures  None  Antibiotics   Anti-infectives    Start     Dose/Rate Route Frequency Ordered Stop   06/12/16 0615  cefTRIAXone (ROCEPHIN) 1 g in dextrose 5 % 50 mL IVPB     1 g 100 mL/hr over 30 Minutes Intravenous Daily 06/12/16 0604     06/10/16 1345  cefTRIAXone (ROCEPHIN) 1 g in dextrose 5 % 50 mL IVPB     1 g 100 mL/hr over 30 Minutes Intravenous  Once 06/10/16 1338 06/10/16 1506      Subjective:   [name] seen and examined today.  Patient is complaining of cough and feeling weak. Wants the effusion drained. Does not feel that he is at risk of bleeding.  Feels this has been ongoing for 4 months and wants something done about it now. Denies any chest pain, abdominal pain, nausea or vomiting, diarrhea or constipation, headache or dizziness.    Objective:   Vitals:   06/12/16  [id number] 06/12/16 1000 06/12/16 1010 06/12/16 1017  BP:  97/63    Pulse: (!) 105 (!) 172 (!) 180   Resp: (!) 35 (!) 39 (!) 37 20  Temp:      TempSrc:      SpO2: 99% 100% 100%   Weight:        Height:        Intake/Output Summary (Last 24 hours) at 06/12/16 1101 Last data filed at 06/12/16 1000  Gross per 24 hour  Intake            500.5 ml  Output             2200 ml  Net          -1699.5 ml   Filed Weights   06/10/16 1048 06/10/16 1110  Weight: 95.7 kg (211 lb) 95.7 kg (211 lb 1 oz)    Exam  General: Well developed, well nourished, NAD, appears stated age  HEENT: NCAT, mucous membranes moist.   Cardiovascular: S1 S2 auscultated, tachycardic   Respiratory: Diminished breath sounds, upper airway rhonchi and congestion noted anteriorly  Abdomen: Soft, nontender, nondistended, + bowel sounds  Extremities: warm dry without cyanosis clubbing. +LE  edema B/L  Neuro: AAOx3, nonfocal   Data Reviewed: I have personally reviewed following labs and imaging studies  CBC:  Recent Labs Lab 06/06/16 1101 06/10/16 1107 06/11/16 1200 06/12/16 0422  WBC 13.3* 16.2* 14.6* 19.6*  NEUTROABS 11.0* 15.2*  --   --   HGB 10.7* 10.4* 10.1* 9.7*  HCT 34.7* 32.6* 32.9* 31.2*  MCV 94.3 91.3 92.9 92.3  PLT 3* <5* <5* <5*   Basic Metabolic Panel:  Recent Labs Lab 06/06/16 1101 06/10/16 1107 06/10/16 2254 06/11/16 1200 06/12/16 0024 06/12/16 0422  NA 136 133*  --  136 137 135  K 3.7 3.9  --  4.2 3.5 3.7  CL  --  93*  --  92* 92* 90*  CO2 32* 31  --  36* 39* 35*  GLUCOSE 225* 240*  --  177* 67 129*  BUN 24.5 22*  --  24* 24* 26*  CREATININE 0.8 0.88  --  0.95 0.82 0.92  CALCIUM 8.9 8.3*  --  8.2* 8.4* 8.3*  MG  --   --  1.4*  --  1.6*  --    GFR: Estimated Creatinine Clearance: 83.2 mL/min (by C-G formula based on SCr of 0.92 mg/dL). Liver Function Tests:  Recent Labs Lab 06/06/16 1101 06/10/16 1107 06/11/16 1044  AST 14 16 14*  ALT 18 15* 13*  ALKPHOS 71 62 56  BILITOT 0.56 0.5 0.5  PROT 6.7 6.9 6.4*  ALBUMIN 2.4* 2.7* 2.4*   No results for input(s): LIPASE, AMYLASE in the last 168 hours. No results for input(s): AMMONIA in the last 168  hours. Coagulation Profile:  Recent Labs Lab 06/10/16 1107  INR 1.13   Cardiac Enzymes:  Recent Labs Lab 06/10/16 1107  TROPONINI <0.03   BNP (last 3 results)  Recent Labs  02/17/16 0948 05/05/16 1450  PROBNP 40.0 17.0   HbA1C: No results for input(s): HGBA1C in the last 72 hours. CBG:  Recent Labs Lab 06/11/16 2116 06/12/16 0225 06/12/16 0252 06/12/16 0423 06/12/16 0831  GLUCAP 219* 41* 82 191* 181*   Lipid Profile: No results for input(s): CHOL, HDL, LDLCALC, TRIG, CHOLHDL, LDLDIRECT in the last 72 hours. Thyroid Function Tests:  Recent Labs  06/11/16 1044  TSH 2.718   Anemia Panel: No results for input(s):  VITAMINB12, FOLATE, FERRITIN, TIBC, IRON, RETICCTPCT in the last 72 hours. Urine analysis:    Component Value Date/Time   COLORURINE YELLOW 06/10/2016 1155   APPEARANCEUR CLEAR 06/10/2016 1155   LABSPEC 1.010 06/10/2016 1155   PHURINE 5.0 06/10/2016 1155   GLUCOSEU NEGATIVE 06/10/2016 1155   HGBUR MODERATE (A) 06/10/2016 1155   BILIRUBINUR NEGATIVE 06/10/2016 1155   KETONESUR NEGATIVE 06/10/2016 1155   PROTEINUR NEGATIVE 06/10/2016 1155   UROBILINOGEN 1.0 11/22/2008 1922   NITRITE NEGATIVE 06/10/2016 1155   LEUKOCYTESUR LARGE (A) 06/10/2016 1155   Sepsis Labs: @LABRCNTIP (procalcitonin:4,lacticidven:4)  ) Recent Results (from the past 240 hour(s))  Culture, blood (Routine x 2)     Status: None (Preliminary result)   Collection Time: 06/10/16 11:12 AM  Result Value Ref Range Status   Specimen Description BLOOD LEFT ANTECUBITAL  Final   Special Requests BOTTLES DRAWN AEROBIC AND ANAEROBIC 10CC  Final   Culture   Final    NO GROWTH 1 DAY Performed at [facility], [place] [address] [place], [place] [address]    Report Status PENDING  Incomplete  Culture, blood (Routine x 2)     Status: None (Preliminary result)   Collection Time: 06/10/16 11:14 AM  Result Value Ref Range Status   Specimen Description BLOOD RIGHT ANTECUBITAL  Final    Special Requests BOTTLES DRAWN AEROBIC AND ANAEROBIC 5CC  Final   Culture   Final    NO GROWTH 1 DAY Performed at [facility], [place] [address], [place], [place] [address]    Report Status PENDING  Incomplete  MRSA PCR Screening     Status: None   Collection Time: 06/10/16  4:49 PM  Result Value Ref Range Status   MRSA by PCR NEGATIVE NEGATIVE Final    Comment:        The GeneXpert MRSA Assay (FDA approved for NASAL specimens only), is one component of a comprehensive MRSA colonization surveillance program. It is not intended to diagnose MRSA infection nor to guide or monitor treatment for MRSA infections.       Radiology Studies: Dg Chest 2 View  Result Date: 06/10/2016 CLINICAL DATA:  Dyspnea EXAM: CHEST  2 VIEW COMPARISON:  02/17/2016 chest radiograph. FINDINGS: Stable cardiomediastinal silhouette with normal heart size. No pneumothorax. Small right and small to moderate left pleural effusions, stable on the right and increased on the left. Emphysema. Patchy bibasilar lung opacities, increased on the left and stable on the right. IMPRESSION: 1. Stable small right pleural effusion. Increased small to moderate left pleural effusion. 2. Patchy bibasilar lung opacities, stable on the right and increased on the left, favor atelectasis. 3. Emphysema. Electronically Signed   By: Ilona Sorrel M.D.   On: 06/10/2016 11:07   Dg Chest Port 1 View  Result Date: 06/12/2016 CLINICAL DATA:  Pleural effusion EXAM: PORTABLE CHEST 1 VIEW COMPARISON:  06/10/2016 FINDINGS: Cardiomediastinal silhouette is stable. Subtle mild perihilar interstitial prominence suspicious for mild pulmonary edema. Stable small right pleural effusion. Stable left basilar atelectasis or infiltrate. Right basilar atelectasis. IMPRESSION: Subtle mild perihilar interstitial prominence suspicious for mild pulmonary edema. Stable small right pleural effusion. Stable left basilar atelectasis or infiltrate. Right basilar  atelectasis. Electronically Signed   By: Lahoma Crocker M.D.   On: 06/12/2016 08:49     Scheduled Meds: . sodium chloride   Intravenous Once  . arformoterol  15 mcg Nebulization BID  . budesonide (PULMICORT) nebulizer solution  0.25 mg Nebulization BID  . cefTRIAXone (ROCEPHIN)  IV  1 g Intravenous Daily  . famotidine  20 mg Oral QHS  . furosemide  80 mg Intravenous BID  . insulin aspart  0-15 Units Subcutaneous TID WC  . insulin glargine  30 Units Subcutaneous QHS  . ipratropium  0.5 mg Nebulization TID  . mouth rinse  15 mL Mouth Rinse BID  . predniSONE  40 mg Oral Q breakfast   Continuous Infusions: . amiodarone 30 mg/hr (06/12/16 0800)     LOS: 2 days   Time Spent in minutes   30 minutes  Araceli Arango D.O. on 06/12/2016 at 11:01 AM  Between 7am to 7pm - Pager - [phone]  After 7pm go to [email or web address] - password [email or web address]  And look for the night coverage person covering for me after hours  [facility] Office  [phone]

## 2016-06-12 NOTE — Progress Notes (Signed)
CRITICAL VALUE ALERT  Critical value received:  Lactic acid 2.3  Date of notification:  06/12/16  Time of notification:  [id number]  Critical value read back:Yes.    Nurse who received alert:  Reche Dixon  MD notified (1st page):  K Schorr  Time of first page:  [phone]  MD notified (2nd page):  Time of second page:  Responding MD:  Lamar Blinks  Time MD responded:  0600

## 2016-06-12 NOTE — Progress Notes (Signed)
EKG ordered to be done prior to cardizem initiation. Not done per PA at bedside. Will obtain EKG if patient goes back into SVT, now in NSR 90s-110s.

## 2016-06-12 NOTE — Progress Notes (Signed)
  Amiodarone Drug - Drug Interaction Consult Note  Recommendations:  Amiodarone is metabolized by the cytochrome P450 system and therefore has the potential to cause many drug interactions. Amiodarone has an average plasma half-life of 50 days (range 20 to 100 days).   There is potential for drug interactions to occur several weeks or months after stopping treatment and the onset of drug interactions may be slow after initiating amiodarone.   []  Statins: Increased risk of myopathy. Simvastatin- restrict dose to 20mg  daily. Other statins: counsel patients to report any muscle pain or weakness immediately.  []  Anticoagulants: Amiodarone can increase anticoagulant effect. Consider warfarin dose reduction. Patients should be monitored closely and the dose of anticoagulant altered accordingly, remembering that amiodarone levels take several weeks to stabilize.  []  Antiepileptics: Amiodarone can increase plasma concentration of phenytoin, the dose should be reduced. Note that small changes in phenytoin dose can result in large changes in levels. Monitor patient and counsel on signs of toxicity.  []  Beta blockers: increased risk of bradycardia, AV block and myocardial depression. Sotalol - avoid concomitant use.  [x]   Calcium channel blockers (diltiazem and verapamil): increased risk of bradycardia, AV block and myocardial depression.  []   Cyclosporine: Amiodarone increases levels of cyclosporine. Reduced dose of cyclosporine is recommended.  []  Digoxin dose should be halved when amiodarone is started.  [x]  Diuretics: increased risk of cardiotoxicity if hypokalemia occurs.  []  Oral hypoglycemic agents (glyburide, glipizide, glimepiride): increased risk of hypoglycemia. Patient's glucose levels should be monitored closely when initiating amiodarone therapy.   []  Drugs that prolong the QT interval:  Torsades de pointes risk may be increased with concurrent use - avoid if possible.  Monitor QTc,  also keep magnesium/potassium WNL if concurrent therapy can't be avoided. Marland Kitchen Antibiotics: e.g. fluoroquinolones, erythromycin. . Antiarrhythmics: e.g. quinidine, procainamide, disopyramide, sotalol. . Antipsychotics: e.g. phenothiazines, haloperidol.  . Lithium, tricyclic antidepressants, and methadone. Thank You,  Nani Skillern Crowford  06/12/2016 11:41 PM

## 2016-06-12 NOTE — Care Management Note (Signed)
Case Management Note  Patient Details  Name: [name] MRN: [id number] Date of Birth: 1941/01/06  Subjective/Objective:     sepsis               Action/Plan: Date:  June 12, 2016 Chart reviewed for concurrent status and case management needs. Will continue to follow patient progress. Discharge Planning: following for needs Expected discharge date: [id number] Velva Harman, BSN, [place], [place]  Expected Discharge Date:                  Expected Discharge Plan:  Home/Self Care  In-House Referral:     Discharge planning Services     Post Acute Care Choice:    Choice offered to:     DME Arranged:    DME Agency:     HH Arranged:    [place] Agency:     Status of Service:  In process, will continue to follow  If discussed at Long Length of Stay Meetings, dates discussed:    Additional Comments:  Leeroy Cha, RN 06/12/2016, 9:43 AM

## 2016-06-12 NOTE — Progress Notes (Signed)
At [place] this RN auscultated coarse crackles in the upper and lower lungs. Earlier in the shift the patient had fine crackles in upper lungs. This RN called [facility] NP who ordered 40 mg  of lasix.  At 0415 the patient's rectal temperature was 94.7 farenheit.  This RN called the [facility] NP and asked for a warming blanket and if they wanted to draw a lactic acid. Warming blanket was ordered and applied at 0418. Lactic acid ordered and will be drawn.  No further orders, RN will continue to monitor.

## 2016-06-12 NOTE — Consult Note (Signed)
[facility] Consult  Physician Requesting Consult:  Cristal Ford, D.O. 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 Patient reports he has had shortness of breath for approximately 3-4 months. After evaluation in our pulmonary clinic he did have some improvement but this was short-lived. The patient reports his dyspnea returned and became especially worse over the last 1-2 weeks. This prompted his presentation to hospital. Patient reports his dyspnea seems to be worse with exertion and he describes it as a "lack of endurance". Patient does endorse a nonproductive cough. He denies any subjective fever, chills, or sweats. He denies any sinus congestion, pressure, or drainage. He denies any sore throat. He denies any recent sick contacts. Patient has noticed increased swelling in his legs but denies any chest pain, chest pressure, or palpitations. The patient also reports that his dyspnea is worse with putting on his shoes and bending over. He reports his dyspnea does not seem to have improved significantly since admission. Since admission the patient has had difficult to control arrhythmia. He has been undergoing diuresis with episodes of borderline hypotension in the setting of his episodes of arrhythmia. Patient has been on antibiotics for urinary tract infection coverage.  Review of Systems:  No rashes. Patient does endorse easy bruising. Denies any abdominal pain, nausea, vomiting, melena, or hematochezia. Denies any headache or acute vision changes. A peritnent 14 point review of systems is negative except as per the history of presenting illness.  No Known Allergies  No current facility-administered medications on file prior to encounter.    Current Outpatient Prescriptions on File Prior to Encounter  Medication Sig Dispense Refill  .  albuterol (PROAIR HFA) 108 (90 Base) MCG/ACT inhaler 2 puffs every 4 hours as needed only  if your can't catch your breath 1 Inhaler 11  . aminocaproic acid (AMICAR) 500 MG tablet Take 2 tablets (1,000 mg total) by mouth every 8 (eight) hours. As Needed 60 tablet 1  . dextromethorphan-guaiFENesin (MUCINEX DM) 30-600 MG 12hr tablet Take 1 tablet by mouth 2 (two) times daily as needed for cough.    . famotidine (PEPCID) 20 MG tablet One at bedtime    . GINKGO BILOBA PO Take 1 tablet by mouth daily.     . Glucosamine-Chondroit-Vit C-Mn (GLUCOSAMINE 1500 COMPLEX PO) Take 1,500 mg by mouth 2 (two) times daily.    Marland Kitchen losartan (COZAAR) 50 MG tablet Take 1 tablet (50 mg total) by mouth daily. 90 tablet 2  . metFORMIN (GLUCOPHAGE) 1000 MG tablet Take 1,000 mg by mouth 2 (two) times daily with a meal.    . Multiple Vitamins-Minerals (MULTIVITAMIN PO) Take by mouth 1 day or 1 dose.    . pantoprazole (PROTONIX) 40 MG tablet Take 1 tablet (40 mg total) by mouth daily. Take 30-60 min before first meal of the day 30 tablet 2  . predniSONE (DELTASONE) 20 MG tablet Take 4.5 tablets (90 mg total) by mouth daily with breakfast. (Patient taking differently: Take 40 mg by mouth daily with breakfast. ) 135 tablet 1  . simvastatin (ZOCOR) 80 MG tablet Take 80 mg by mouth daily.     Marland Kitchen Respiratory Therapy Supplies (FLUTTER) DEVI Use as directed 1 each 0    Past Medical History:  Diagnosis Date  . Adenomatous colon polyp    01/2006  . Chronic pansinusitis   . Diverticula  of colon   . History of ITP   . History of nephrolithiasis   . Hyperlipemia   . Sinus drainage    Cough with post nasal drip  . Type 2 diabetes mellitus ([place])     Past Surgical History:  Procedure Laterality Date  . COLONOSCOPY    . ESOPHAGOGASTRODUODENOSCOPY    . ESOPHAGOGASTRODUODENOSCOPY  05/19/2011   Procedure: ESOPHAGOGASTRODUODENOSCOPY (EGD);  Surgeon: Wonda Horner, MD;  Location: [facility];  Service: Endoscopy;  Laterality: N/A;   stat CBC  APC also needed  . HOT HEMOSTASIS  05/19/2011   Procedure: HOT HEMOSTASIS (ARGON PLASMA COAGULATION/BICAP);  Surgeon: Wonda Horner, MD;  Location: [facility];  Service: Endoscopy;  Laterality: N/A;  . Spinal cyst resection      Family History  Problem Relation Age of Onset  . Colon cancer Mother   . Heart disease Mother   . Liver cancer Father   . Heart disease Father     Social History   Social History  . Marital status: Married    Spouse name: N/A  . Number of children: N/A  . Years of education: N/A   Social History Main Topics  . Smoking status: Former Smoker    Packs/day: 1.00    Years: 42.00    Types: Cigarettes    Start date: 05/02/1959    Quit date: 05/01/2001  . Smokeless tobacco: Never Used  . Alcohol use Yes     Comment: social  . Drug use: No  . Sexual activity: Not Asked   Other Topics Concern  . 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 Denies any pets currently. No bird or mold exposure. No recent travel.          Temp:  [94.3 F (34.6 C)-98.3 F (36.8 C)] 97.9 F (36.6 C) (02/12 0815) Pulse Rate:  [87-180] 180 (02/12 1010) Resp:  [17-44] 20 (02/12 1017) BP: (97-144)/(49-78) 97/63 (02/12 1000) SpO2:  [95 %-100 %] 100 % (02/12 1010)   General:  Awake. Alert. No acute distress. Watching TV. Wife & daughter at bedside. Integument:  Warm & dry. No rash on exposed skin. Minimal bruising of various ages noted. Extremities:  No cyanosis or clubbing.  Lymphatics:  No appreciated cervical or supraclavicular lymphadenoapthy. HEENT:  Tacky mucus membranes. No oral ulcers. No scleral injection or icterus.  Cardiovascular:  Slightly tachycardic. 2+ pitting edema bilateral lower extremities. No JVD appreciated with body positioning.  Pulmonary:  Decreased breath sounds bilateral lung bases with faint end expiratory wheeze bilaterally. Symmetric chest wall expansion. No  accessory muscle use on nasal cannula oxygen. Abdomen: Soft. Normal bowel sounds. Nondistended. Nontender. Musculoskeletal:  Normal bulk and tone. Hand grip strength 5/5 bilaterally. No joint deformity or effusion appreciated. Neurological:  Cranial nerves 2-12 grossly in tact. No meningismus. Moving all 4 extremities equally. Symmetric brachioradialis deep tendon reflexes. Psychiatric:  Mood and affect congruent. Speech normal rhythm, rate & tone.   CBC Latest Ref Rng & Units 06/12/2016 06/11/2016 06/10/2016  WBC 4.0 - 10.5 K/uL 19.6(H) 14.6(H) 16.2(H)  Hemoglobin 13.0 - 17.0 g/dL 9.7(L) 10.1(L) 10.4(L)  Hematocrit 39.0 - 52.0 % 31.2(L) 32.9(L) 32.6(L)  Platelets 150 - 400 K/uL <5(LL) <5(LL) <5(LL)    BMP Latest Ref Rng & Units 06/12/2016 06/12/2016 06/11/2016  Glucose 65 - 99 mg/dL 129(H) 67 177(H)  BUN 6 - 20 mg/dL 26(H) 24(H) 24(H)  Creatinine 0.61 - 1.24 mg/dL 0.92  0.82 0.95  Sodium 135 - 145 mmol/L 135 137 136  Potassium 3.5 - 5.1 mmol/L 3.7 3.5 4.2  Chloride 101 - 111 mmol/L 90(L) 92(L) 92(L)  CO2 22 - 32 mmol/L 35(H) 39(H) 36(H)  Calcium 8.9 - 10.3 mg/dL 8.3(L) 8.4(L) 8.2(L)   Hepatic Function Latest Ref Rng & Units 06/11/2016 06/10/2016 06/06/2016  Total Protein 6.5 - 8.1 g/dL 6.4(L) 6.9 6.7  Albumin 3.5 - 5.0 g/dL 2.4(L) 2.7(L) 2.4(L)  AST 15 - 41 U/L 14(L) 16 14  ALT 17 - 63 U/L 13(L) 15(L) 18  Alk Phosphatase 38 - 126 U/L 56 62 71  Total Bilirubin 0.3 - 1.2 mg/dL 0.5 0.5 0.56  Bilirubin, Direct 0.1 - 0.5 mg/dL <0.1(L) - -    IMAGING/STUDIES: PFT 04/04/16: FVC 1.17 L (38%) FEV1 1.02 L (31%) FEV1/FVC 0.59 FEF 25-75 0.45 L (19%) negative bronchodilator response TLC 5.04 L (69%) RV 124% ERV 32% DLCO corrected 49%  CT CHEST W/ CONTRAST 06/09/16:  Personally reviewed by me. Bilateral pleural effusions left greater than right. No significant change when compared with CT imaging from January. No pathologic mediastinal adenopathy. No pericardial effusion. No obvious focal opacity in the  patient does have scattered subpleural reticulation.  PORT CXR 06/12/16:  Personally reviewed by me. Opacification in both lower lobes. Significant worse opacification within the left lower lobe and suggestive of pleural effusion bilaterally left greater than right. This is unchanged when compared with chest x-ray PA/LAT on 06/10/16 by my review.  TTE 06/11/16: LV poorly visualized. Grade 1 diastolic dysfunction with EF 55-60% and normal regional wall motion. LA poorly visualized but appears normal in size. RA normal in size. RV poorly visualized but cavity size appeared normal as well as systolic function. Aortic valve poorly visualized but without obvious stenosis or regurgitation. Aortic root normal in size. Mitral valve poorly visualized but without evidence of stenosis or regurgitation. Pulmonic valve poorly visualized but without stenosis or regurgitation. Mild tricuspid regurgitation. No pericardial effusion.  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 He may have some minimal improvement in the size of his pleural effusions with diuresis based upon my review of his chest x-ray from today when compared with previous x-ray imaging. However, he certainly has had pleural effusion since January by my review of his imaging. The most likely etiology for his pleural effusions stems from his diastolic congestive heart failure. In the absence of symptoms I do not feel that he has an underlying pneumonia and that these represent parapneumonic effusions. His mild wheezes feels due to his congestive heart failure rather than an acute exacerbation of his underlying COPD. As such, I am adjusting his nebulizer regimen.  1. Bilateral pleural effusions: Recommend continued diuresis as blood pressure and renal function allows. Deferring thoracentesis  until platelet count improved and stable. 2. Sepsis:  Likely secondary to UTI. Ordering procalcitonin to trend per algorithm.  3. Very severe COPD: Based on a function testing from 2017 by my review. Discontinuing Brovana. Continuing Pulmicort nebulized twice daily. Switching to Xopenex 0.63 mg nebulized every 6 hours as needed. Continuing Atrovent nebulizers 3 times a day.  Remainder of care as per primary service and cardiology. We will be available as needed please contact the rounding physician if there is any change in his platelet count all clinical status for which we may be of assistance.  Sonia Baller Ashok Cordia, M.D. [facility] Pager:  [phone] After 3pm or if no response, call [phone] 12:06 PM 06/12/16

## 2016-06-12 NOTE — Progress Notes (Signed)
HEMATOLOGY I saw the patient and reviewed the chart and discussed with the hospitalist team 1. 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 Awaiting response to treatment 2. profound shortness of breath as result of large pleural effusions. I agree that the patient would benefit from thoracentesis. In order to accomplish that, we can proceed with giving 2 units of platelets and checking a post platelet count. If it is more than 50, patient couldn't undergo thoracentesis. Please send the pleural fluid for flow cytometry for lymphoma as well as routine cytology. Pulmonary has been consulted. I also discussed the case with Dr. Melvyn Novas, who is also the patient's pulmonologist.

## 2016-06-12 NOTE — Progress Notes (Signed)
Patient ID: [name], male   DOB: 1940-09-14, 76 y.o.   MRN: [id number] Per Dr. Anselm Pancoast, plt count should ideally be above 50k before thoracentesis performed. Current level of <5 much too low to proceed.

## 2016-06-12 NOTE — Progress Notes (Addendum)
Hypoglycemic Event  CBG: 53  Treatment: 15 GM carbohydrate snack  Symptoms: Sweaty, Shaky and Nervous/irritable  Follow-up CBG: Time:11:24 CBG Result: 78  Possible Reasons for Event: Unknown  Comments/MD notified:MD notified    Adele Dan

## 2016-06-12 NOTE — Telephone Encounter (Signed)
"  This is Dai Duhn calling to speak with Dr. [name].  Saul was admitted to [facility] ICU room 1230 Saturday with Pleural effusion.   I want Dr. [name] involved in every decision for treatment.  They say they will contact Dr. [name] but I want to talk with him.  There's so much going on with him.  They have started an antibiotic, his blood sugar was low in the forties.  They need to drain the fluid from around the lung but his platelets are low.  They're talking with radiology but I need to make sure Dr. [name] is involved with all decisions of care because he's been caring for him the past month.  There hasn't been a pulmonologist yet.  Everything is dragging because it's the weekend and he needs help now.  Can Dr. [name] call us in the room?"    Advised the hospitalist are excellent care providers.  Will notify Dr. [name] of this call.

## 2016-06-12 NOTE — Progress Notes (Signed)
Hypoglycemic Event  CBG: 41  Treatment: 15 GM carbohydrate snack  Symptoms: Pale, Sweaty, Shaky and Hungry  Follow-up CBG: Time:0252 CBG Result:82  Possible Reasons for Event: Unknown  Comments/MD notified:K Schorr paged    Orrin Brigham

## 2016-06-13 ENCOUNTER — Ambulatory Visit: Payer: Medicare HMO

## 2016-06-13 ENCOUNTER — Other Ambulatory Visit: Payer: Medicare HMO

## 2016-06-13 ENCOUNTER — Ambulatory Visit: Payer: Medicare HMO | Admitting: Hematology and Oncology

## 2016-06-13 LAB — BASIC METABOLIC PANEL
Anion gap: 7 (ref 5–15)
BUN: 20 mg/dL (ref 6–20)
CHLORIDE: 86 mmol/L — AB (ref 101–111)
CO2: 41 mmol/L — AB (ref 22–32)
CREATININE: 0.77 mg/dL (ref 0.61–1.24)
Calcium: 8.2 mg/dL — ABNORMAL LOW (ref 8.9–10.3)
GFR calc non Af Amer: >60 mL/min (ref >60)
Glucose, Bld: 165 mg/dL — ABNORMAL HIGH (ref 65–99)
POTASSIUM: 3.4 mmol/L — AB (ref 3.5–5.1)
Sodium: 134 mmol/L — ABNORMAL LOW (ref 135–145)

## 2016-06-13 LAB — GLUCOSE, CAPILLARY
GLUCOSE-CAPILLARY: 201 mg/dL — AB (ref 65–99)
GLUCOSE-CAPILLARY: 215 mg/dL — AB (ref 65–99)
Glucose-Capillary: 277 mg/dL — ABNORMAL HIGH (ref 65–99)
Glucose-Capillary: 275 mg/dL — ABNORMAL HIGH (ref 65–99)

## 2016-06-13 LAB — PREPARE PLATELET PHERESIS
Blood Product Expiration Date: 201802122359
Blood Product Expiration Date: 201802142359
ISSUE DATE / TIME: 201802121341
ISSUE DATE / TIME: 201802121706
UNIT TYPE AND RH: 7300
Unit Type and Rh: 6200

## 2016-06-13 LAB — CBC
HEMATOCRIT: 28.7 % — AB (ref 39.0–52.0)
HEMOGLOBIN: 9 g/dL — AB (ref 13.0–17.0)
MCH: 28.8 pg (ref 26.0–34.0)
MCHC: 31.4 g/dL (ref 30.0–36.0)
MCV: 92 fL (ref 78.0–100.0)
Platelets: 5 10*3/uL — CL (ref 150–400)
RBC: 3.12 MIL/uL — AB (ref 4.22–5.81)
RDW: 15.5 % (ref 11.5–15.5)
WBC: 11.6 10*3/uL — ABNORMAL HIGH (ref 4.0–10.5)

## 2016-06-13 LAB — URINE CULTURE

## 2016-06-13 LAB — MAGNESIUM: Magnesium: 1.6 mg/dL — ABNORMAL LOW (ref 1.7–2.4)

## 2016-06-13 MED ORDER — IMMUNE GLOBULIN (HUMAN) 5 GM/50ML IV SOLN
1.0000 g/kg | Freq: Once | INTRAVENOUS | Status: AC
  Administered 2016-06-13: 14:00:00 95 g via INTRAVENOUS
  Filled 2016-06-13: qty 50

## 2016-06-13 MED ORDER — DILTIAZEM HCL 60 MG PO TABS
60.0000 mg | ORAL_TABLET | Freq: Four times a day (QID) | ORAL | Status: DC
  Administered 2016-06-13 – 2016-06-15 (×8): 60 mg via ORAL
  Filled 2016-06-13 (×8): qty 1

## 2016-06-13 MED ORDER — SODIUM CHLORIDE 0.9 % IV SOLN: Freq: Once | INTRAVENOUS | Status: DC

## 2016-06-13 MED ORDER — MAGNESIUM SULFATE 2 GM/50ML IV SOLN
2.0000 g | Freq: Once | INTRAVENOUS | Status: AC
  Administered 2016-06-13: 2 g via INTRAVENOUS
  Filled 2016-06-13: qty 50

## 2016-06-13 MED ORDER — IMMUNE GLOBULIN (HUMAN) 20 GM/200ML IV SOLN: 1.0000 g/kg | Freq: Once | INTRAVENOUS | Status: DC

## 2016-06-13 NOTE — Progress Notes (Signed)
In RN to RN report, oncoming RN informed to begin PLT administration at 0300 on 06/14/16, in preparation for thoracentesis. Pt not currently on schedule for IR procedure, per IR staff. RN informed that following PLT administration pt had short window of time to have thoracentesis if PLT count allotted. RN spoke with Dr. Geroge Baseman, on-call for radiology, regarding ideal time for 2U PLT administration in preparation for pt need for thoracentesis on 06/14/16. Dr. Geroge Baseman suggested holding off on PLT administration until later AM of 06/14/16, and then starting one unit, and potentially having pt go to IR for thoracentesis procedure with second unit of PLTs transfusing; once time was known for procedure. RN consulted with on-call, Lamar Blinks, NP, regarding current orders, information received in RN to RN report, and information received from Dr. Geroge Baseman. Orders modified. Will continue to monitor.

## 2016-06-13 NOTE — Progress Notes (Signed)
PROGRESS NOTE    [name]  [id number] DOB: [birth year] DOA: 06/10/2016 PCP: [name], MD   Chief Complaint  Patient presents with  . chemo patient  . Shortness of Breath    Brief Narrative:  HPI on 06/10/2016 by Dr. Chipper Oman [name] is a 76 y.o. male with medical history significant of recently diagnosed with ITP, bronchiectasis, hypertension, diabetes mellitus type 2. Presented to the ED complaining of shortness of breath for the past week has been worsening with no improvement after inhaler therapies. Patient was seen by his hematologist yesterday ordered a CT scan which showed increasing pleural effusion from previous studies. Since seen by pulmonologist, who has him on prednisone taper and multiple inhalers. Hematology giving him IVIG infusions with poor response.  Interim history Admitted for pleural effusion. Attempting to diurese patient. Patient developed SVT, cardiology consulted.  Currently on Cardizem drip. Hem/Onc consulted for platelets <5. Given 2u on 2/12.  Will given another 2u platelets after IVIG today. Assessment & Plan   Pleural effusion -CXR showed pleural effusions -CT chest showed large bilateral pleural effusions -Admitting Physician discuss with Dr. Ashok Cordia with PCCM, patient not a candidate for thoracentesis at this point given low platelets. Recommended aggressive diuresis. If patient became unstable, transfuse platelets and placed chest tube. -Placed on Lasix -Monitor intake and output, urine output 2456cc over the past 24 hours -CXR this morning shows mild improvement in Right pleural effusion -Spoke with IR, plts need to be >50 -Will transfuse 2u platelets. If plts >50, and still in need of thoracentesis will order that at the appropriate time.  Sepsis -Patient presented with leukocytosis and tachycardia, tachypnea -Leukocytosis improving  -Possibly secondary to urinary tract infection -Blood cultures show no  growth -Urine culture multiple species -Continue to monitor  -Continue ceftriaxone   PSVT -Patient has had heart rates in the 180s. -Suspect related to pulmonary etiology  -Echocardiogram EF 0000000, grade 1 diastolic dysfunction -Magnesium 1.6, will replace (goal 2) -Potassium 3.4, will replace (goal 4) -Cardiology consulted and appreciated -Continue amiodarone and cardizem drips -HR better controlled today  Urinary tract infection -As above -Continue ceftriaxone  Bronchiectasis -Patient has been on high-dose prednisone in the past -Continue prednisone 40 mg daily -Continue nebs, Pulmicort, supplemental oxygen -Pulmonology consulted and appreciated  ITP -Patient follows with Dr. [name], oncology. Spoke with Dr. [name] 2/12. Transfused 2 u platelets -Has been receiving IVIG infusions/ Rituxan -platelets 5 today -Continue to monitor CBC -Patient and wife wish to have another 2 u transfused -Dr. [name] consulted and appreciated- spoke with Dr. [name] today, 2/13. Will transfuse another 2u platelets after infusion of IVIG.  Obtain CBC 30 minutes after transfusion.  Diabetes mellitus, type II complicated by hypoglycemia -Metformin held, continue insulin sliding scale and CBG monitoring -lantus discontinued due to hypoglycemic episodes  Essential hypertension -BP has been somewhat soft, continue to monitor closely -Patient does not use blood pressure medications at home  DVT Prophylaxis  SCDs  Code Status: Full  Family Communication: None at bedside  Disposition Plan: Admitted. Continue to monitor in stepdown.  Consultants Cardiology PCCM Oncology, Dr. [name]  Procedures  None  Antibiotics   Anti-infectives    Start     Dose/Rate Route Frequency Ordered Stop   06/12/16 0615  cefTRIAXone (ROCEPHIN) 1 g in dextrose 5 % 50 mL IVPB     1 g 100 mL/hr over 30 Minutes Intravenous Daily 06/12/16 0604     06/10/16 1345  cefTRIAXone (ROCEPHIN) 1 g in dextrose 5 %  50 mL  IVPB     1 g 100 mL/hr over 30 Minutes Intravenous  Once 06/10/16 1338 06/10/16 1506      Subjective:   [name] seen and examined today.  Patient continues to complain of shortness of breath and cough.  Feels weak.  Wants a thoracentesis. Denies any chest pain, abdominal pain, nausea or vomiting, diarrhea or constipation, headache or dizziness.    Objective:   Vitals:   06/13/16 0739 06/13/16 0800 06/13/16 0900 06/13/16 1000  BP:  (!) 122/49 (!) 126/53 (!) 103/52  Pulse:  93 100 81  Resp:  18 19 (!) 27  Temp:  98.2 F (36.8 C)    TempSrc:  Oral    SpO2: 95% 93% 95% 95%  Weight:      Height:        Intake/Output Summary (Last 24 hours) at 06/13/16 1051 Last data filed at 06/13/16 0834  Gross per 24 hour  Intake           2383.4 ml  Output             2140 ml  Net            243.4 ml   Filed Weights   06/10/16 1048 06/10/16 1110  Weight: 95.7 kg (211 lb) 95.7 kg (211 lb 1 oz)    Exam  General: Well developed, well nourished, no distress  HEENT: NCAT, mucous membranes moist.   Cardiovascular: S1 S2 auscultated, RRR  Respiratory: Diminished breath sounds, b/L expiratory wheezing.  Abdomen: Soft, nontender, nondistended, + bowel sounds  Extremities: warm dry without cyanosis clubbing. +2LE  edema B/L  Neuro: AAOx3, nonfocal  Psych: Appropriate   Data Reviewed: I have personally reviewed following labs and imaging studies  CBC:  Recent Labs Lab 06/06/16 1101 06/10/16 1107 06/11/16 1200 06/12/16 0422 06/13/16 0344  WBC 13.3* 16.2* 14.6* 19.6* 11.6*  NEUTROABS 11.0* 15.2*  --   --   --   HGB 10.7* 10.4* 10.1* 9.7* 9.0*  HCT 34.7* 32.6* 32.9* 31.2* 28.7*  MCV 94.3 91.3 92.9 92.3 92.0  PLT 3* <5* <5* <5* 5*   Basic Metabolic Panel:  Recent Labs Lab 06/10/16 1107 06/10/16 2254 06/11/16 1200 06/12/16 0024 06/12/16 0422 06/13/16 0344  NA 133*  --  136 137 135 134*  K 3.9  --  4.2 3.5 3.7 3.4*  CL 93*  --  92* 92* 90* 86*  CO2 31  --   36* 39* 35* 41*  GLUCOSE 240*  --  177* 67 129* 165*  BUN 22*  --  24* 24* 26* 20  CREATININE 0.88  --  0.95 0.82 0.92 0.77  CALCIUM 8.3*  --  8.2* 8.4* 8.3* 8.2*  MG  --  1.4*  --  1.6* 1.5* 1.6*   GFR: Estimated Creatinine Clearance: 95.7 mL/min (by C-G formula based on SCr of 0.77 mg/dL). Liver Function Tests:  Recent Labs Lab 06/06/16 1101 06/10/16 1107 06/11/16 1044  AST 14 16 14*  ALT 18 15* 13*  ALKPHOS 71 62 56  BILITOT 0.56 0.5 0.5  PROT 6.7 6.9 6.4*  ALBUMIN 2.4* 2.7* 2.4*   No results for input(s): LIPASE, AMYLASE in the last 168 hours. No results for input(s): AMMONIA in the last 168 hours. Coagulation Profile:  Recent Labs Lab 06/10/16 1107  INR 1.13   Cardiac Enzymes:  Recent Labs Lab 06/10/16 1107  TROPONINI <0.03   BNP (last 3 results)  Recent Labs  02/17/16 0948 05/05/16  1450  PROBNP 40.0 17.0   HbA1C: No results for input(s): HGBA1C in the last 72 hours. CBG:  Recent Labs Lab 06/12/16 1108 06/12/16 1126 06/12/16 1641 06/12/16 2132 06/13/16 0714  GLUCAP 53* 78 307* 253* 201*   Lipid Profile: No results for input(s): CHOL, HDL, LDLCALC, TRIG, CHOLHDL, LDLDIRECT in the last 72 hours. Thyroid Function Tests:  Recent Labs  06/11/16 1044  TSH 2.718   Anemia Panel: No results for input(s): VITAMINB12, FOLATE, FERRITIN, TIBC, IRON, RETICCTPCT in the last 72 hours. Urine analysis:    Component Value Date/Time   COLORURINE YELLOW 06/10/2016 1155   APPEARANCEUR CLEAR 06/10/2016 1155   LABSPEC 1.010 06/10/2016 1155   PHURINE 5.0 06/10/2016 1155   GLUCOSEU NEGATIVE 06/10/2016 1155   HGBUR MODERATE (A) 06/10/2016 1155   BILIRUBINUR NEGATIVE 06/10/2016 1155   KETONESUR NEGATIVE 06/10/2016 1155   PROTEINUR NEGATIVE 06/10/2016 1155   UROBILINOGEN 1.0 11/22/2008 1922   NITRITE NEGATIVE 06/10/2016 1155   LEUKOCYTESUR LARGE (A) 06/10/2016 1155   Sepsis Labs: @LABRCNTIP (procalcitonin:4,lacticidven:4)  ) Recent Results (from the  past 240 hour(s))  Culture, blood (Routine x 2)     Status: None (Preliminary result)   Collection Time: 06/10/16 11:12 AM  Result Value Ref Range Status   Specimen Description BLOOD LEFT ANTECUBITAL  Final   Special Requests BOTTLES DRAWN AEROBIC AND ANAEROBIC 10CC  Final   Culture   Final    NO GROWTH 2 DAYS Performed at [facility], [place] [address], [place], [place] [address]    Report Status PENDING  Incomplete  Culture, blood (Routine x 2)     Status: None (Preliminary result)   Collection Time: 06/10/16 11:14 AM  Result Value Ref Range Status   Specimen Description BLOOD RIGHT ANTECUBITAL  Final   Special Requests BOTTLES DRAWN AEROBIC AND ANAEROBIC 5CC  Final   Culture   Final    NO GROWTH 2 DAYS Performed at [facility], [place] [address], [place], [place] [address]    Report Status PENDING  Incomplete  Urine culture     Status: Abnormal   Collection Time: 06/10/16 11:55 AM  Result Value Ref Range Status   Specimen Description URINE, RANDOM  Final   Special Requests NONE  Final   Culture MULTIPLE SPECIES PRESENT, SUGGEST RECOLLECTION (A)  Final   Report Status 06/13/2016 FINAL  Final  MRSA PCR Screening     Status: None   Collection Time: 06/10/16  4:49 PM  Result Value Ref Range Status   MRSA by PCR NEGATIVE NEGATIVE Final    Comment:        The GeneXpert MRSA Assay (FDA approved for NASAL specimens only), is one component of a comprehensive MRSA colonization surveillance program. It is not intended to diagnose MRSA infection nor to guide or monitor treatment for MRSA infections.       Radiology Studies: Dg Chest Port 1 View  Result Date: 06/12/2016 CLINICAL DATA:  Pleural effusion EXAM: PORTABLE CHEST 1 VIEW COMPARISON:  06/10/2016 FINDINGS: Cardiomediastinal silhouette is stable. Subtle mild perihilar interstitial prominence suspicious for mild pulmonary edema. Stable small right pleural effusion. Stable left basilar atelectasis or infiltrate.  Right basilar atelectasis. IMPRESSION: Subtle mild perihilar interstitial prominence suspicious for mild pulmonary edema. Stable small right pleural effusion. Stable left basilar atelectasis or infiltrate. Right basilar atelectasis. Electronically Signed   By: Lahoma Crocker M.D.   On: 06/12/2016 08:49     Scheduled Meds: . budesonide (PULMICORT) nebulizer solution  0.25 mg  Nebulization BID  . cefTRIAXone (ROCEPHIN)  IV  1 g Intravenous Daily  . famotidine  20 mg Oral QHS  . furosemide  80 mg Intravenous BID  . insulin aspart  0-15 Units Subcutaneous TID WC  . ipratropium  0.5 mg Nebulization TID  . mouth rinse  15 mL Mouth Rinse BID  . pantoprazole  40 mg Oral Daily  . predniSONE  40 mg Oral Q breakfast   Continuous Infusions: . amiodarone 30 mg/hr (06/13/16 0800)  . diltiazem (CARDIZEM) infusion 15 mg/hr (06/13/16 1005)     LOS: 3 days   Time Spent in minutes   30 minutes  Ericah Scotto D.O. on 06/13/2016 at 10:51 AM  Between 7am to 7pm - Pager - [phone]  After 7pm go to [email or web address] - password [email or web address]  And look for the night coverage person covering for me after hours  [facility] Office  [phone]

## 2016-06-13 NOTE — Progress Notes (Signed)
The patient has been seen in conjunction with Reino Bellis, [facility]. All aspects of care have been considered and discussed. The patient has been personally interviewed, examined, and all clinical data has been reviewed.   Agree with note as outlined below.  We can start transitioning parenteral medications to oral dosing. I will convert the IV diltiazem to oral diltiazem. Perhaps amiodarone will be converted in a.m. Start 60 mg every 6 hours and DC drip.   Progress Note  Patient Name: [name] Date of Encounter: 06/13/2016  Primary Cardiologist: New, Dr Tamala Julian  Subjective   Continues to have significant breathing issues today, but platelets remain low.   Inpatient Medications    Scheduled Meds: . sodium chloride   Intravenous Once  . budesonide (PULMICORT) nebulizer solution  0.25 mg Nebulization BID  . cefTRIAXone (ROCEPHIN)  IV  1 g Intravenous Daily  . famotidine  20 mg Oral QHS  . furosemide  80 mg Intravenous BID  . Immune Globulin 10%  1 g/kg Intravenous Once  . insulin aspart  0-15 Units Subcutaneous TID WC  . ipratropium  0.5 mg Nebulization TID  . magnesium sulfate 1 - 4 g bolus IVPB  2 g Intravenous Once  . mouth rinse  15 mL Mouth Rinse BID  . pantoprazole  40 mg Oral Daily  . predniSONE  40 mg Oral Q breakfast   Continuous Infusions: . amiodarone 30 mg/hr (06/13/16 0800)  . diltiazem (CARDIZEM) infusion 15 mg/hr (06/13/16 1005)   PRN Meds: adenosine (ADENOCARD) IV, diltiazem, levalbuterol   Vital Signs    Vitals:   06/13/16 0900 06/13/16 1000 06/13/16 1100 06/13/16 1200  BP: (!) 126/53 (!) 103/52 (!) 95/57 120/61  Pulse: 100 81 93 88  Resp: 19 (!) 27 20 (!) 31  Temp:    98.5 F (36.9 C)  TempSrc:    Oral  SpO2: 95% 95% 96% 98%  Weight:      Height:        Intake/Output Summary (Last 24 hours) at 06/13/16 1216 Last data filed at 06/13/16 1200  Gross per 24 hour  Intake          2039.83 ml  Output             2185 ml  Net           -145.17 ml   Filed Weights   06/10/16 1048 06/10/16 1110  Weight: 211 lb (95.7 kg) 211 lb 1 oz (95.7 kg)    Telemetry    SR with fewer episodes of SVT - Personally Reviewed  ECG    N/A - Personally Reviewed  Physical Exam   General: Well developed, well nourished, male appearing in no acute distress. Head: Normocephalic, atraumatic.  Neck: Supple without bruits, JVD mildly elevated. Lungs:  Resp regular and unlabored, decreased BS bases w/ rales. Heart: RRR, S1, S2, no S3, S4, or murmur; no rub. Abdomen: Soft, non-tender, non-distended with normoactive bowel sounds. No hepatomegaly. No rebound/guarding. No obvious abdominal masses. Extremities: No clubbing, cyanosis, 1+ edema. Distal pedal pulses are 2+ bilaterally. Neuro: Alert and oriented X 3. Moves all extremities spontaneously. Psych: Normal affect.  Labs    Chemistry Recent Labs Lab 06/10/16 1107 06/11/16 1044  06/12/16 0024 06/12/16 0422 06/13/16 0344  NA 133*  --   < > 137 135 134*  K 3.9  --   < > 3.5 3.7 3.4*  CL 93*  --   < > 92* 90* 86*  CO2 31  --   < >  39* 35* 41*  GLUCOSE 240*  --   < > 67 129* 165*  BUN 22*  --   < > 24* 26* 20  CREATININE 0.88  --   < > 0.82 0.92 0.77  CALCIUM 8.3*  --   < > 8.4* 8.3* 8.2*  PROT 6.9 6.4*  --   --   --   --   ALBUMIN 2.7* 2.4*  --   --   --   --   AST 16 14*  --   --   --   --   ALT 15* 13*  --   --   --   --   ALKPHOS 62 56  --   --   --   --   BILITOT 0.5 0.5  --   --   --   --   GFRNONAA >60  --   < > >60 >60 >60  GFRAA >60  --   < > >60 >60 >60  ANIONGAP 9  --   < > 6 10 7   < > = values in this interval not displayed.   Hematology  Recent Labs Lab 06/11/16 1200 06/12/16 0422 06/13/16 0344  WBC 14.6* 19.6* 11.6*  RBC 3.54* 3.38* 3.12*  HGB 10.1* 9.7* 9.0*  HCT 32.9* 31.2* 28.7*  MCV 92.9 92.3 92.0  MCH 28.5 28.7 28.8  MCHC 30.7 31.1 31.4  RDW 15.8* 15.5 15.5  PLT <5* <5* 5*    Cardiac Enzymes  Recent Labs Lab 06/10/16 1107  TROPONINI  <0.03    BNP  Recent Labs Lab 06/10/16 1107  BNP 109.9*     Radiology    Dg Chest 2 View Result Date: 06/10/2016 CLINICAL DATA:  Dyspnea EXAM: CHEST  2 VIEW COMPARISON:  02/17/2016 chest radiograph. FINDINGS: Stable cardiomediastinal silhouette with normal heart size. No pneumothorax. Small right and small to moderate left pleural effusions, stable on the right and increased on the left. Emphysema. Patchy bibasilar lung opacities, increased on the left and stable on the right. IMPRESSION: 1. Stable small right pleural effusion. Increased small to moderate left pleural effusion. 2. Patchy bibasilar lung opacities, stable on the right and increased on the left, favor atelectasis. 3. Emphysema. Electronically Signed   By: Ilona Sorrel M.D.   On: 06/10/2016 11:07    Cardiac Studies   ECHO 06/11/2016  - Left ventricle: The cavity size was normal. There was moderate   concentric hypertrophy. Systolic function was normal. The   estimated ejection fraction was in the range of 55% to 60%. Wall   motion was normal; there were no regional wall motion   abnormalities. Doppler parameters are consistent with abnormal   left ventricular relaxation (grade 1 diastolic dysfunction). - Aortic valve: Trileaflet; moderately thickened, moderately   calcified leaflets. Valve mobility was restricted. - Tricuspid valve: There was mild regurgitation. - Pericardium, extracardiac: There was a small left pleural   effusion with rounded mass like confluence adjacent to LV apical free wall.  Impressions: - PSVT noted during study (180bpm)  Patient Profile     76 y.o. male with a history of ITP (recent dx), HLD, DM, bronchiectasis, was admitted 02/10 w/ SOB, plt <5, pleural effusion. Pt in/out of SVT, but episodes have slowed with the addition of Amio/Cardizem.   Assessment & Plan    1.  Parox SVT (nonsustained ventricular tachycardia)  - fewer episodes noted today, responding to IV medications -  probably stimulated by the acute illness, effusions - continue  Amiodarone, and Cardizem for now. Blood pressure remains stable. - echo showed normal EF, and he continues to have good UOP.   2. ITP: Received 2 units of platelets. Platelet count remains low, but slightly better today. -- on IVIG infusions, oncology following regarding the need for thora with ITP. Plan for another platelet transfusion.  3. Pleural Effusion: high risk for thoracentesis  4. Hypokalemia: Replaced per primary, along with mg.  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

## 2016-06-14 ENCOUNTER — Inpatient Hospital Stay (HOSPITAL_COMMUNITY): Payer: Medicare HMO

## 2016-06-14 LAB — BODY FLUID CELL COUNT WITH DIFFERENTIAL
Eos, Fluid: 0 %
LYMPHS FL: 18 %
MONOCYTE-MACROPHAGE-SEROUS FLUID: 40 % — AB (ref 50–90)
NEUTROPHIL FLUID: 42 % — AB (ref 0–25)
Total Nucleated Cell Count, Fluid: 2556 cu mm — ABNORMAL HIGH (ref 0–1000)

## 2016-06-14 LAB — CBC
HCT: 28.4 % — ABNORMAL LOW (ref 39.0–52.0)
HEMOGLOBIN: 8.9 g/dL — AB (ref 13.0–17.0)
MCH: 28.9 pg (ref 26.0–34.0)
MCHC: 31.3 g/dL (ref 30.0–36.0)
MCV: 92.2 fL (ref 78.0–100.0)
PLATELETS: 13 10*3/uL — AB (ref 150–400)
RBC: 3.08 MIL/uL — AB (ref 4.22–5.81)
RDW: 14.8 % (ref 11.5–15.5)
WBC: 9.8 10*3/uL (ref 4.0–10.5)

## 2016-06-14 LAB — GLUCOSE, CAPILLARY
GLUCOSE-CAPILLARY: 234 mg/dL — AB (ref 65–99)
GLUCOSE-CAPILLARY: 165 mg/dL — AB (ref 65–99)
Glucose-Capillary: 237 mg/dL — ABNORMAL HIGH (ref 65–99)
Glucose-Capillary: 249 mg/dL — ABNORMAL HIGH (ref 65–99)

## 2016-06-14 LAB — BASIC METABOLIC PANEL
ANION GAP: 6 (ref 5–15)
BUN: 21 mg/dL — ABNORMAL HIGH (ref 6–20)
CHLORIDE: 82 mmol/L — AB (ref 101–111)
CO2: 42 mmol/L — ABNORMAL HIGH (ref 22–32)
Calcium: 8.2 mg/dL — ABNORMAL LOW (ref 8.9–10.3)
Creatinine, Ser: 0.83 mg/dL (ref 0.61–1.24)
GFR calc Af Amer: >60 mL/min (ref >60)
GLUCOSE: 222 mg/dL — AB (ref 65–99)
Potassium: 5 mmol/L (ref 3.5–5.1)
Sodium: 130 mmol/L — ABNORMAL LOW (ref 135–145)

## 2016-06-14 LAB — PROTEIN, PLEURAL OR PERITONEAL FLUID: Total protein, fluid: 3.7 g/dL

## 2016-06-14 LAB — GLUCOSE, PLEURAL OR PERITONEAL FLUID: GLUCOSE FL: 181 mg/dL

## 2016-06-14 LAB — LACTATE DEHYDROGENASE, PLEURAL OR PERITONEAL FLUID: LD FL: 1124 U/L — AB (ref 3–23)

## 2016-06-14 LAB — PROCALCITONIN: Procalcitonin: <0.1 ng/mL

## 2016-06-14 MED ORDER — INSULIN ASPART 100 UNIT/ML ~~LOC~~ SOLN
0.0000 [IU] | Freq: Every day | SUBCUTANEOUS | Status: DC
  Administered 2016-06-16: 2 [IU] via SUBCUTANEOUS
  Administered 2016-06-17: 3 [IU] via SUBCUTANEOUS

## 2016-06-14 MED ORDER — INSULIN ASPART 100 UNIT/ML ~~LOC~~ SOLN: 6.0000 [IU] | Freq: Three times a day (TID) | SUBCUTANEOUS | Status: DC

## 2016-06-14 MED ORDER — PREDNISONE 20 MG PO TABS
20.0000 mg | ORAL_TABLET | Freq: Every day | ORAL | Status: AC
  Administered 2016-06-15 – 2016-06-20 (×6): 20 mg via ORAL
  Filled 2016-06-14 (×6): qty 1

## 2016-06-14 MED ORDER — INSULIN GLARGINE 100 UNIT/ML ~~LOC~~ SOLN
15.0000 [IU] | Freq: Every day | SUBCUTANEOUS | Status: DC
  Administered 2016-06-14: 15 [IU] via SUBCUTANEOUS
  Filled 2016-06-14 (×2): qty 0.15

## 2016-06-14 MED ORDER — AMIODARONE HCL 200 MG PO TABS
400.0000 mg | ORAL_TABLET | Freq: Two times a day (BID) | ORAL | Status: DC
  Administered 2016-06-14 – 2016-06-15 (×3): 400 mg via ORAL
  Filled 2016-06-14 (×3): qty 2

## 2016-06-14 MED ORDER — INSULIN ASPART 100 UNIT/ML ~~LOC~~ SOLN
0.0000 [IU] | Freq: Three times a day (TID) | SUBCUTANEOUS | Status: DC
  Administered 2016-06-14: 4 [IU] via SUBCUTANEOUS
  Administered 2016-06-14 – 2016-06-15 (×3): 7 [IU] via SUBCUTANEOUS
  Administered 2016-06-15: 4 [IU] via SUBCUTANEOUS
  Administered 2016-06-15 – 2016-06-16 (×2): 7 [IU] via SUBCUTANEOUS
  Administered 2016-06-16 (×2): 4 [IU] via SUBCUTANEOUS
  Administered 2016-06-17: 15 [IU] via SUBCUTANEOUS
  Administered 2016-06-17: 3 [IU] via SUBCUTANEOUS
  Administered 2016-06-18: 7 [IU] via SUBCUTANEOUS
  Administered 2016-06-18: 4 [IU] via SUBCUTANEOUS
  Administered 2016-06-18: 7 [IU] via SUBCUTANEOUS
  Administered 2016-06-19: 20 [IU] via SUBCUTANEOUS
  Administered 2016-06-19: 7 [IU] via SUBCUTANEOUS
  Administered 2016-06-19: 4 [IU] via SUBCUTANEOUS
  Administered 2016-06-20: 11 [IU] via SUBCUTANEOUS
  Administered 2016-06-20: 4 [IU] via SUBCUTANEOUS

## 2016-06-14 MED ORDER — INSULIN GLARGINE 100 UNIT/ML ~~LOC~~ SOLN
12.0000 [IU] | Freq: Every day | SUBCUTANEOUS | Status: DC
  Filled 2016-06-14: qty 0.12

## 2016-06-14 MED ORDER — AMIODARONE HCL 200 MG PO TABS: 400.0000 mg | ORAL_TABLET | Freq: Every day | ORAL | Status: DC

## 2016-06-14 MED ORDER — INSULIN GLARGINE 100 UNIT/ML ~~LOC~~ SOLN: 14.0000 [IU] | Freq: Every day | SUBCUTANEOUS | Status: DC

## 2016-06-14 MED ORDER — INSULIN ASPART 100 UNIT/ML ~~LOC~~ SOLN
5.0000 [IU] | Freq: Three times a day (TID) | SUBCUTANEOUS | Status: DC
  Administered 2016-06-14 – 2016-06-15 (×6): 5 [IU] via SUBCUTANEOUS

## 2016-06-14 NOTE — Progress Notes (Signed)
PROGRESS NOTE    [name]  [id number] DOB: 03/06/1941 DOA: 06/10/2016 PCP: [name], MD   Chief Complaint  Patient presents with  . chemo patient  . Shortness of Breath    Brief Narrative:  HPI on 06/10/2016 by Dr. Chipper Oman [name] is a 76 y.o. male with medical history significant of recently diagnosed with ITP, bronchiectasis, hypertension, diabetes mellitus type 2. Presented to the ED complaining of shortness of breath for the past week has been worsening with no improvement after inhaler therapies. Patient was seen by his hematologist yesterday ordered a CT scan which showed increasing pleural effusion from previous studies. Since seen by pulmonologist, who has him on prednisone taper and multiple inhalers. Hematology giving him IVIG infusions with poor response.  Interim history Admitted for pleural effusion. Attempting to diurese patient. Patient developed SVT, cardiology consulted.  Currently on Cardizem drip. Hem/Onc consulted for platelets <5. Given 2u on 2/12.  Will give another 2u platelets 2/14 prior to thoracentesis.  Assessment & Plan   Pleural effusion -CXR showed pleural effusions -CT chest showed large bilateral pleural effusions -Admitting Physician discuss with Dr. Ashok Cordia with PCCM, patient not a candidate for thoracentesis until platelet count improves. They recommended aggressive diuresis. If patient became unstable, transfuse platelets and placed chest tube. -Placed on Lasix, will continue for now -Monitor intake and output, down 4L since admission -CXR shows mild improvement in right pleural effusion -Spoke with IR, plts need to be >50, planning to transfuse platelets prior to procedure today -Will transfuse 2u platelets as ordered.   Sepsis -Patient presented with leukocytosis and tachycardia, tachypnea -Leukocytosis improving  -Possibly secondary to urinary tract infection -Blood cultures show no growth -Urine culture  multiple species -Continue to monitor  -Continue ceftriaxone   PSVT -Patient has had heart rates in the 180s. -Suspect related to pulmonary etiology  -Echocardiogram EF 0000000, grade 1 diastolic dysfunction -Magnesium repleted -Potassium repleted -Cardiology consulted and appreciated -Continue amiodarone and cardizem per cardiology service. -HR better controlled today  Urinary tract infection -As above -Continue ceftriaxone  Bronchiectasis -Patient has been on high-dose prednisone in the past -Continue prednisone 40 mg daily (Pt has been refusing to take prescribed dose) -Continue nebs, Pulmicort, supplemental oxygen (Pt has been refusing nebs) -Pulmonology consulted and appreciated  ITP -Patient follows with Dr. [name], oncology. Spoke with Dr. [name] 2/12. Transfused 2 u platelets -Has been receiving IVIG infusions/ Rituxan -platelets 5 today -Continue to monitor CBC -Patient and wife wish to have another 2 u transfused -Dr. [name] consulted and appreciated- spoke with Dr. [name] today, 2/13. Will transfuse another 2u platelets after infusion of IVIG.  Obtain CBC 30 minutes after transfusion.  Diabetes mellitus, type 2 complicated by hypoglycemia -Metformin held, continue insulin sliding scale and CBG monitoring -lantus had been discontinued due to hypoglycemic episodes, but would restart as he is having hyperglycemia now CBG (last 3)   Recent Labs  06/13/16 1646 06/13/16 2149 06/14/16 0804  GLUCAP 277* 275* 237*   Essential hypertension -BP has been somewhat soft, continue to monitor closely -Patient does not use blood pressure medications at home  DVT Prophylaxis  SCDs  Code Status: Full  Family Communication: None at bedside  Disposition Plan: Admitted. Continue to monitor in stepdown.  Consultants Cardiology PCCM Oncology, Dr. [name]  Procedures  None  Antibiotics   Anti-infectives    Start     Dose/Rate Route Frequency Ordered Stop   06/12/16  0615  cefTRIAXone (ROCEPHIN) 1 g in dextrose 5 %  50 mL IVPB     1 g 100 mL/hr over 30 Minutes Intravenous Daily 06/12/16 0604     06/10/16 1345  cefTRIAXone (ROCEPHIN) 1 g in dextrose 5 % 50 mL IVPB     1 g 100 mL/hr over 30 Minutes Intravenous  Once 06/10/16 1338 06/10/16 1506      Subjective:   [name] seen and examined today.  Pt reports that breathing is unchanged.      Objective:   Vitals:   06/14/16 0400 06/14/16 0500 06/14/16 0600 06/14/16 0800  BP: (!) 123/51 (!) 127/48 (!) 139/52 133/60  Pulse: 86 80 87 86  Resp: (!) 24 (!) 21 19 (!) 25  Temp:      TempSrc:      SpO2: 95% 96% 94% 93%  Weight:      Height:        Intake/Output Summary (Last 24 hours) at 06/14/16 0829 Last data filed at 06/14/16 0800  Gross per 24 hour  Intake            502.4 ml  Output             1950 ml  Net          -1447.6 ml   Filed Weights   06/10/16 1048 06/10/16 1110  Weight: 95.7 kg (211 lb) 95.7 kg (211 lb 1 oz)    Exam  General: Well developed, well nourished, no distress  HEENT: NCAT, mucous membranes moist.   Cardiovascular: S1 S2 auscultated, RRR  Respiratory: Diminished breath sounds, b/L expiratory wheezing.  Abdomen: Soft, nontender, nondistended, + bowel sounds  Extremities: warm dry without cyanosis clubbing. +2LE  edema B/L  Neuro: AAOx3, nonfocal  Psych: Appropriate   Data Reviewed: I have personally reviewed following labs and imaging studies  CBC:  Recent Labs Lab 06/10/16 1107 06/11/16 1200 06/12/16 0422 06/13/16 0344 06/14/16 0355  WBC 16.2* 14.6* 19.6* 11.6* 9.8  NEUTROABS 15.2*  --   --   --   --   HGB 10.4* 10.1* 9.7* 9.0* 8.9*  HCT 32.6* 32.9* 31.2* 28.7* 28.4*  MCV 91.3 92.9 92.3 92.0 92.2  PLT <5* <5* <5* 5* 13*   Basic Metabolic Panel:  Recent Labs Lab 06/10/16 2254 06/11/16 1200 06/12/16 0024 06/12/16 0422 06/13/16 0344 06/14/16 0355  NA  --  136 137 135 134* 130*  K  --  4.2 3.5 3.7 3.4* 5.0  CL  --  92* 92* 90*  86* 82*  CO2  --  36* 39* 35* 41* 42*  GLUCOSE  --  177* 67 129* 165* 222*  BUN  --  24* 24* 26* 20 21*  CREATININE  --  0.95 0.82 0.92 0.77 0.83  CALCIUM  --  8.2* 8.4* 8.3* 8.2* 8.2*  MG 1.4*  --  1.6* 1.5* 1.6*  --    GFR: Estimated Creatinine Clearance: 92.2 mL/min (by C-G formula based on SCr of 0.83 mg/dL). Liver Function Tests:  Recent Labs Lab 06/10/16 1107 06/11/16 1044  AST 16 14*  ALT 15* 13*  ALKPHOS 62 56  BILITOT 0.5 0.5  PROT 6.9 6.4*  ALBUMIN 2.7* 2.4*   No results for input(s): LIPASE, AMYLASE in the last 168 hours. No results for input(s): AMMONIA in the last 168 hours. Coagulation Profile:  Recent Labs Lab 06/10/16 1107  INR 1.13   Cardiac Enzymes:  Recent Labs Lab 06/10/16 1107  TROPONINI <0.03   BNP (last 3 results)  Recent Labs  02/17/16 0948 05/05/16  1450  PROBNP 40.0 17.0   HbA1C: No results for input(s): HGBA1C in the last 72 hours. CBG:  Recent Labs Lab 06/13/16 0714 06/13/16 1130 06/13/16 1646 06/13/16 2149 06/14/16 0804  GLUCAP 201* 215* 277* 275* 237*   Lipid Profile: No results for input(s): CHOL, HDL, LDLCALC, TRIG, CHOLHDL, LDLDIRECT in the last 72 hours. Thyroid Function Tests:  Recent Labs  06/11/16 1044  TSH 2.718   Anemia Panel: No results for input(s): VITAMINB12, FOLATE, FERRITIN, TIBC, IRON, RETICCTPCT in the last 72 hours. Urine analysis:    Component Value Date/Time   COLORURINE YELLOW 06/10/2016 1155   APPEARANCEUR CLEAR 06/10/2016 1155   LABSPEC 1.010 06/10/2016 1155   PHURINE 5.0 06/10/2016 1155   GLUCOSEU NEGATIVE 06/10/2016 1155   HGBUR MODERATE (A) 06/10/2016 1155   BILIRUBINUR NEGATIVE 06/10/2016 1155   KETONESUR NEGATIVE 06/10/2016 1155   PROTEINUR NEGATIVE 06/10/2016 1155   UROBILINOGEN 1.0 11/22/2008 1922   NITRITE NEGATIVE 06/10/2016 1155   LEUKOCYTESUR LARGE (A) 06/10/2016 1155    Recent Results (from the past 240 hour(s))  Culture, blood (Routine x 2)     Status: None  (Preliminary result)   Collection Time: 06/10/16 11:12 AM  Result Value Ref Range Status   Specimen Description BLOOD LEFT ANTECUBITAL  Final   Special Requests BOTTLES DRAWN AEROBIC AND ANAEROBIC 10CC  Final   Culture   Final    NO GROWTH 3 DAYS Performed at [facility], [place] [address] [place], [place] [address]    Report Status PENDING  Incomplete  Culture, blood (Routine x 2)     Status: None (Preliminary result)   Collection Time: 06/10/16 11:14 AM  Result Value Ref Range Status   Specimen Description BLOOD RIGHT ANTECUBITAL  Final   Special Requests BOTTLES DRAWN AEROBIC AND ANAEROBIC 5CC  Final   Culture   Final    NO GROWTH 3 DAYS Performed at [facility], [place] [address] [place], [place] [address]    Report Status PENDING  Incomplete  Urine culture     Status: Abnormal   Collection Time: 06/10/16 11:55 AM  Result Value Ref Range Status   Specimen Description URINE, RANDOM  Final   Special Requests NONE  Final   Culture MULTIPLE SPECIES PRESENT, SUGGEST RECOLLECTION (A)  Final   Report Status 06/13/2016 FINAL  Final  MRSA PCR Screening     Status: None   Collection Time: 06/10/16  4:49 PM  Result Value Ref Range Status   MRSA by PCR NEGATIVE NEGATIVE Final    Comment:        The GeneXpert MRSA Assay (FDA approved for NASAL specimens only), is one component of a comprehensive MRSA colonization surveillance program. It is not intended to diagnose MRSA infection nor to guide or monitor treatment for MRSA infections.     Radiology Studies: Dg Chest Port 1 View  Result Date: 06/12/2016 CLINICAL DATA:  Pleural effusion EXAM: PORTABLE CHEST 1 VIEW COMPARISON:  06/10/2016 FINDINGS: Cardiomediastinal silhouette is stable. Subtle mild perihilar interstitial prominence suspicious for mild pulmonary edema. Stable small right pleural effusion. Stable left basilar atelectasis or infiltrate. Right basilar atelectasis. IMPRESSION: Subtle mild perihilar  interstitial prominence suspicious for mild pulmonary edema. Stable small right pleural effusion. Stable left basilar atelectasis or infiltrate. Right basilar atelectasis. Electronically Signed   By: Lahoma Crocker M.D.   On: 06/12/2016 08:49   Scheduled Meds: . sodium chloride   Intravenous Once  . budesonide (PULMICORT) nebulizer solution  0.25 mg  Nebulization BID  . cefTRIAXone (ROCEPHIN)  IV  1 g Intravenous Daily  . diltiazem  60 mg Oral Q6H  . famotidine  20 mg Oral QHS  . furosemide  80 mg Intravenous BID  . insulin aspart  0-20 Units Subcutaneous TID WC  . insulin aspart  0-5 Units Subcutaneous QHS  . insulin aspart  5 Units Subcutaneous TID WC  . insulin glargine  14 Units Subcutaneous QHS  . ipratropium  0.5 mg Nebulization TID  . mouth rinse  15 mL Mouth Rinse BID  . pantoprazole  40 mg Oral Daily  . predniSONE  40 mg Oral Q breakfast   Continuous Infusions: . amiodarone 30 mg/hr (06/14/16 0800)  . diltiazem (CARDIZEM) infusion Stopped (06/13/16 1719)     LOS: 4 days   Critical Care Time Spent in minutes  35 minutes  Matt Delpizzo MD on 06/14/2016 at 8:29 AM  Between 7am to 7pm - Pager - [phone]  After 7pm go to [email or web address] - password [email or web address]  And look for the night coverage person covering for me after hours  [facility] Office  [phone]

## 2016-06-14 NOTE — Progress Notes (Signed)
Discussion with patient, patient's wife and daughter- concerns expressed about patient's plan of care. Patient and his family requesting for thoracentesis to be completed today, regardless of patient's platelet levels.   Patient requesting to speak with Dr. [name]. MD paged and quickly returned call, and was able to speak with wife and patient.

## 2016-06-14 NOTE — Procedures (Signed)
US guided left thoracentesis performed.  Procedure performed in ICU and during platelet infusion due to severe thrombocytopenia.  1.5 liters of dark bloody fluid removed.  Procedure stopped when patient started complaining of mild chest pain.  Plan for CXR and will send fluid for analysis.

## 2016-06-14 NOTE — Progress Notes (Signed)
Pt refused his am neb tx. No distress noted at this time. Family at bedside.

## 2016-06-14 NOTE — Progress Notes (Signed)
The patient has been seen in conjunction with Reino Bellis. NP-C. All aspects of care have been considered and discussed. The patient has been personally interviewed, examined, and all clinical data has been reviewed.   Plan as outlined. Convert to oral antiarrhythmic therapy and transition off as his acute illness resolves.  Monitor for bradycardia.  Will need 5 days of amiodarone 400 mg BID then decrease to 200 mg BID for 2 weeks then 200 mg daily until stopped.  We will probably be able to stop diltiazem soon.   Progress Note  Patient Name: [name] Date of Encounter: 06/14/2016  Primary Cardiologist: New, Dr Tamala Julian  Subjective   Respiratory status remains about the same. Hopeful to have a thoracentesis today.   Inpatient Medications    Scheduled Meds: . sodium chloride   Intravenous Once  . budesonide (PULMICORT) nebulizer solution  0.25 mg Nebulization BID  . diltiazem  60 mg Oral Q6H  . famotidine  20 mg Oral QHS  . furosemide  80 mg Intravenous BID  . insulin aspart  0-20 Units Subcutaneous TID WC  . insulin aspart  0-5 Units Subcutaneous QHS  . insulin aspart  5 Units Subcutaneous TID WC  . insulin glargine  12 Units Subcutaneous QHS  . ipratropium  0.5 mg Nebulization TID  . mouth rinse  15 mL Mouth Rinse BID  . pantoprazole  40 mg Oral Daily  . predniSONE  40 mg Oral Q breakfast   Continuous Infusions: . amiodarone 30 mg/hr (06/14/16 0800)  . diltiazem (CARDIZEM) infusion Stopped (06/13/16 1719)   PRN Meds: adenosine (ADENOCARD) IV, diltiazem, levalbuterol   Vital Signs    Vitals:   06/14/16 0400 06/14/16 0500 06/14/16 0600 06/14/16 0800  BP: (!) 123/51 (!) 127/48 (!) 139/52 133/60  Pulse: 86 80 87 86  Resp: (!) 24 (!) 21 19 (!) 25  Temp:    98.1 F (36.7 C)  TempSrc:    Oral  SpO2: 95% 96% 94% 93%  Weight:      Height:        Intake/Output Summary (Last 24 hours) at 06/14/16 0935 Last data filed at 06/14/16 0900  Gross per 24 hour    Intake            470.7 ml  Output             2150 ml  Net          -1679.3 ml   Filed Weights   06/10/16 1048 06/10/16 1110  Weight: 211 lb (95.7 kg) 211 lb 1 oz (95.7 kg)    Telemetry    SR - Personally Reviewed  ECG    N/A - Personally Reviewed  Physical Exam   General: ill appearing older WM.  Head: Normocephalic, atraumatic.  Neck: Supple without bruits, JVD mildly elevated. Lungs:  Resp regular and unlabored, decreased BS bases w/ rales. Heart: RRR, S1, S2, no S3, S4, or murmur; no rub. Abdomen: Soft, non-tender, non-distended with normoactive bowel sounds. No hepatomegaly. No rebound/guarding. No obvious abdominal masses. Extremities: No clubbing, cyanosis, 1+ edema. Distal pedal pulses are 2+ bilaterally. Neuro: Alert and oriented X 3. Moves all extremities spontaneously. Psych: Normal affect.  Labs    Chemistry Recent Labs Lab 06/10/16 1107 06/11/16 1044  06/12/16 0422 06/13/16 0344 06/14/16 0355  NA 133*  --   < > 135 134* 130*  K 3.9  --   < > 3.7 3.4* 5.0  CL 93*  --   < >  90* 86* 82*  CO2 31  --   < > 35* 41* 42*  GLUCOSE 240*  --   < > 129* 165* 222*  BUN 22*  --   < > 26* 20 21*  CREATININE 0.88  --   < > 0.92 0.77 0.83  CALCIUM 8.3*  --   < > 8.3* 8.2* 8.2*  PROT 6.9 6.4*  --   --   --   --   ALBUMIN 2.7* 2.4*  --   --   --   --   AST 16 14*  --   --   --   --   ALT 15* 13*  --   --   --   --   ALKPHOS 62 56  --   --   --   --   BILITOT 0.5 0.5  --   --   --   --   GFRNONAA >60  --   < > >60 >60 >60  GFRAA >60  --   < > >60 >60 >60  ANIONGAP 9  --   < > 10 7 6   < > = values in this interval not displayed.   Hematology  Recent Labs Lab 06/12/16 0422 06/13/16 0344 06/14/16 0355  WBC 19.6* 11.6* 9.8  RBC 3.38* 3.12* 3.08*  HGB 9.7* 9.0* 8.9*  HCT 31.2* 28.7* 28.4*  MCV 92.3 92.0 92.2  MCH 28.7 28.8 28.9  MCHC 31.1 31.4 31.3  RDW 15.5 15.5 14.8  PLT <5* 5* 13*    Cardiac Enzymes  Recent Labs Lab 06/10/16 1107  TROPONINI  <0.03    BNP  Recent Labs Lab 06/10/16 1107  BNP 109.9*     Radiology    Dg Chest 2 View Result Date: 06/10/2016 CLINICAL DATA:  Dyspnea EXAM: CHEST  2 VIEW COMPARISON:  02/17/2016 chest radiograph. FINDINGS: Stable cardiomediastinal silhouette with normal heart size. No pneumothorax. Small right and small to moderate left pleural effusions, stable on the right and increased on the left. Emphysema. Patchy bibasilar lung opacities, increased on the left and stable on the right. IMPRESSION: 1. Stable small right pleural effusion. Increased small to moderate left pleural effusion. 2. Patchy bibasilar lung opacities, stable on the right and increased on the left, favor atelectasis. 3. Emphysema. Electronically Signed   By: Ilona Sorrel M.D.   On: 06/10/2016 11:07    Cardiac Studies   ECHO 06/11/2016  - Left ventricle: The cavity size was normal. There was moderate   concentric hypertrophy. Systolic function was normal. The   estimated ejection fraction was in the range of 55% to 60%. Wall   motion was normal; there were no regional wall motion   abnormalities. Doppler parameters are consistent with abnormal   left ventricular relaxation (grade 1 diastolic dysfunction). - Aortic valve: Trileaflet; moderately thickened, moderately   calcified leaflets. Valve mobility was restricted. - Tricuspid valve: There was mild regurgitation. - Pericardium, extracardiac: There was a small left pleural   effusion with rounded mass like confluence adjacent to LV apical free wall.  Impressions: - PSVT noted during study (180bpm)  Patient Profile     76 y.o. male with a history of ITP (recent dx), HLD, DM, bronchiectasis, was admitted 02/10 w/ SOB, plt <5, pleural effusion. Pt in/out of SVT, but episodes have slowed with the addition of Amio/Cardizem.   Assessment & Plan    1.  Parox SVT (nonsustained ventricular tachycardia)  - No further episodes noted, transitioned to PO  dilt yesterday 60mg   q6hr. Will transition to PO amio 400mg  BID today.  - probably stimulated by the acute illness, effusions - echo showed normal EF, and he continues to have good UOP.   2. ITP: Received 2 units of platelets yesterday. Platelet count 13,000 today. Planned for IR thoracentesis today after addition 2 units of platelets. -- on IVIG infusions, oncology following   3. Pleural Effusion: hopeful for thora today  4. 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

## 2016-06-15 ENCOUNTER — Inpatient Hospital Stay (HOSPITAL_COMMUNITY): Payer: Medicare HMO

## 2016-06-15 LAB — PREPARE PLATELET PHERESIS
BLOOD PRODUCT EXPIRATION DATE: 201802152359
Blood Product Expiration Date: 201802142359
ISSUE DATE / TIME: 201802141509
ISSUE DATE / TIME: 201802141120
UNIT TYPE AND RH: 6200
Unit Type and Rh: 6200

## 2016-06-15 LAB — CBC WITH DIFFERENTIAL/PLATELET
BASOS ABS: 0 10*3/uL (ref 0.0–0.1)
BASOS PCT: 0 %
EOS ABS: 0 10*3/uL (ref 0.0–0.7)
EOS PCT: 1 %
HCT: 27.7 % — ABNORMAL LOW (ref 39.0–52.0)
Hemoglobin: 8.6 g/dL — ABNORMAL LOW (ref 13.0–17.0)
LYMPHS ABS: 0.8 10*3/uL (ref 0.7–4.0)
Lymphocytes Relative: 9 %
MCH: 28.8 pg (ref 26.0–34.0)
MCHC: 31 g/dL (ref 30.0–36.0)
MCV: 92.6 fL (ref 78.0–100.0)
Monocytes Absolute: 0.6 10*3/uL (ref 0.1–1.0)
Monocytes Relative: 7 %
Neutro Abs: 7.4 10*3/uL (ref 1.7–7.7)
Neutrophils Relative %: 84 %
PLATELETS: 10 10*3/uL — AB (ref 150–400)
RBC: 2.99 MIL/uL — AB (ref 4.22–5.81)
RDW: 14.8 % (ref 11.5–15.5)
WBC: 8.8 10*3/uL (ref 4.0–10.5)

## 2016-06-15 LAB — CULTURE, BLOOD (ROUTINE X 2)
Culture: NO GROWTH
Culture: NO GROWTH

## 2016-06-15 LAB — BASIC METABOLIC PANEL
Anion gap: 7 (ref 5–15)
BUN: 21 mg/dL — ABNORMAL HIGH (ref 6–20)
CALCIUM: 8 mg/dL — AB (ref 8.9–10.3)
CO2: 43 mmol/L — ABNORMAL HIGH (ref 22–32)
Chloride: 83 mmol/L — ABNORMAL LOW (ref 101–111)
Creatinine, Ser: 0.78 mg/dL (ref 0.61–1.24)
Glucose, Bld: 148 mg/dL — ABNORMAL HIGH (ref 65–99)
Potassium: 3.5 mmol/L (ref 3.5–5.1)
SODIUM: 133 mmol/L — AB (ref 135–145)

## 2016-06-15 LAB — GLUCOSE, CAPILLARY
GLUCOSE-CAPILLARY: 165 mg/dL — AB (ref 65–99)
GLUCOSE-CAPILLARY: 156 mg/dL — AB (ref 65–99)
GLUCOSE-CAPILLARY: 233 mg/dL — AB (ref 65–99)
Glucose-Capillary: 232 mg/dL — ABNORMAL HIGH (ref 65–99)
Glucose-Capillary: 193 mg/dL — ABNORMAL HIGH (ref 65–99)

## 2016-06-15 LAB — PH, BODY FLUID: PH, BODY FLUID: 7.6

## 2016-06-15 LAB — MAGNESIUM: MAGNESIUM: 1.6 mg/dL — AB (ref 1.7–2.4)

## 2016-06-15 MED ORDER — FUROSEMIDE 40 MG PO TABS
40.0000 mg | ORAL_TABLET | Freq: Every day | ORAL | Status: DC
  Administered 2016-06-16 – 2016-06-17 (×2): 40 mg via ORAL
  Filled 2016-06-15 (×2): qty 1

## 2016-06-15 MED ORDER — MAGNESIUM SULFATE 50 % IJ SOLN
3.0000 g | Freq: Once | INTRAVENOUS | Status: AC
  Administered 2016-06-15: 3 g via INTRAVENOUS
  Filled 2016-06-15: qty 6

## 2016-06-15 MED ORDER — AMIODARONE HCL 200 MG PO TABS
200.0000 mg | ORAL_TABLET | Freq: Two times a day (BID) | ORAL | Status: DC
  Administered 2016-06-15: 200 mg via ORAL
  Filled 2016-06-15 (×2): qty 1

## 2016-06-15 MED ORDER — INSULIN GLARGINE 100 UNIT/ML ~~LOC~~ SOLN
18.0000 [IU] | Freq: Every day | SUBCUTANEOUS | Status: DC
  Filled 2016-06-15 (×2): qty 0.18

## 2016-06-15 MED ORDER — AMINOCAPROIC ACID 500 MG PO TABS
1000.0000 mg | ORAL_TABLET | Freq: Three times a day (TID) | ORAL | Status: DC
  Administered 2016-06-15 – 2016-06-20 (×16): 1000 mg via ORAL
  Filled 2016-06-15 (×20): qty 2

## 2016-06-15 MED ORDER — INSULIN ASPART 100 UNIT/ML ~~LOC~~ SOLN
6.0000 [IU] | Freq: Three times a day (TID) | SUBCUTANEOUS | Status: DC
  Administered 2016-06-16 (×2): 6 [IU] via SUBCUTANEOUS

## 2016-06-15 NOTE — Progress Notes (Signed)
PROGRESS NOTE    [name]  [id number] DOB: Sep 14, 1940 DOA: 06/10/2016 PCP: [name], MD   Chief Complaint  Patient presents with  . chemo patient  . Shortness of Breath    Brief Narrative:  HPI on 06/10/2016 by Dr. Chipper Oman [name] is a 76 y.o. male with medical history significant of recently diagnosed with ITP, bronchiectasis, hypertension, diabetes mellitus type 2. Presented to the ED complaining of shortness of breath for the past week has been worsening with no improvement after inhaler therapies. Patient was seen by his hematologist yesterday ordered a CT scan which showed increasing pleural effusion from previous studies. Since seen by pulmonologist, who has him on prednisone taper and multiple inhalers. Hematology giving him IVIG infusions with poor response.  Interim history Admitted for pleural effusion. Attempting to diurese patient. Patient developed SVT, cardiology consulted.  Hem/Onc consulted for platelets <5. Given 2u on 2/12.  S/p thoracentesis 2/14.  Assessment & Plan   Pleural effusion -CXR showed pleural effusions -CT chest showed large bilateral pleural effusions -Admitting Physician discuss with Dr. Ashok Cordia with PCCM, patient not a candidate for thoracentesis until platelet count improves. They recommended aggressive diuresis. If patient became unstable, transfuse platelets and placed chest tube. -Placed on Lasix, will continue for now -Monitor intake and output, down 4.5L since admission -Ordered a repeat CXR this morning  PSVT -Patient initially has had heart rates in the 180s. -Echocardiogram EF 0000000, grade 1 diastolic dysfunction -Magnesium being repleted -Potassium being repleted -Cardiology consulted and appreciated -Continue amiodarone and cardizem per cardiology service. -HR better controlled  Urinary tract infection -As above -Treated with ceftriaxone IV  Bronchiectasis -Patient has been on high-dose prednisone  in the past -Continue prednisone 20 mg daily per hem/onc -Continue nebs, Pulmicort, supplemental oxygen (Pt has been refusing nebs) -Pulmonology consulted but not following  ITP -Patient follows with Dr. [name], oncology. Spoke with Dr. [name] 2/12. Transfused 2 u platelets -Has been receiving IVIG infusions/ rituximab -platelets 10 now, no active bleeding -Continue to monitor CBC -Dr. [name] consulted and appreciated  Diabetes mellitus, type 2 complicated by hypoglycemia -Metformin held, continue insulin sliding scale and CBG monitoring -blood sugars better controlled, follow closely CBG (last 3)   Recent Labs  06/14/16 2136 06/15/16 0321 06/15/16 0729  GLUCAP 249* 165* 156*   Hypomagnesemia - IV mag sulfate today   DVT Prophylaxis  SCDs  Code Status: Full  Family Communication: bedside  Disposition Plan: Admitted. Continue to monitor in stepdown.  Consultants Cardiology PCCM Oncology, Dr. [name]  Procedures  Thoracentesis 2/14  Antibiotics   Anti-infectives    Start     Dose/Rate Route Frequency Ordered Stop   06/12/16 0615  cefTRIAXone (ROCEPHIN) 1 g in dextrose 5 % 50 mL IVPB  Status:  Discontinued     1 g 100 mL/hr over 30 Minutes Intravenous Daily 06/12/16 0604 06/14/16 0859   06/10/16 1345  cefTRIAXone (ROCEPHIN) 1 g in dextrose 5 % 50 mL IVPB     1 g 100 mL/hr over 30 Minutes Intravenous  Once 06/10/16 1338 06/10/16 1506      Subjective:   [name] seen and examined today.  Pt reports that breathing is slightly better, gets right sided chest wall pain with deep breaths.      Objective:   Vitals:   06/15/16 0400 06/15/16 0500 06/15/16 0600 06/15/16 0611  BP: (!) 125/51   109/76  Pulse: 74 86 75   Resp: (!) 21 19 19    Temp:  TempSrc:      SpO2: 96% 95% 98%   Weight:      Height:        Intake/Output Summary (Last 24 hours) at 06/15/16 0750 Last data filed at 06/15/16 0600  Gross per 24 hour  Intake          1584.52 ml    Output             2300 ml  Net          -715.48 ml   Filed Weights   06/10/16 1048 06/10/16 1110  Weight: 95.7 kg (211 lb) 95.7 kg (211 lb 1 oz)    Exam  General: Well developed, well nourished, no distress  HEENT: NCAT, mucous membranes moist.   Cardiovascular: S1 S2 normal with systolic murmur  Respiratory: shallow BS bilateral.  Abdomen: Soft, nontender, nondistended, + bowel sounds  Extremities: warm dry without cyanosis clubbing. +2LE  edema B/L  Neuro: AAOx3, nonfocal  Psych: Appropriate   Data Reviewed: I have personally reviewed following labs and imaging studies  CBC:  Recent Labs Lab 06/10/16 1107 06/11/16 1200 06/12/16 0422 06/13/16 0344 06/14/16 0355 06/15/16 0342  WBC 16.2* 14.6* 19.6* 11.6* 9.8 8.8  NEUTROABS 15.2*  --   --   --   --  7.4  HGB 10.4* 10.1* 9.7* 9.0* 8.9* 8.6*  HCT 32.6* 32.9* 31.2* 28.7* 28.4* 27.7*  MCV 91.3 92.9 92.3 92.0 92.2 92.6  PLT <5* <5* <5* 5* 13* 10*   Basic Metabolic Panel:  Recent Labs Lab 06/10/16 2254  06/12/16 0024 06/12/16 0422 06/13/16 0344 06/14/16 0355 06/15/16 0342  NA  --   < > 137 135 134* 130* 133*  K  --   < > 3.5 3.7 3.4* 5.0 3.5  CL  --   < > 92* 90* 86* 82* 83*  CO2  --   < > 39* 35* 41* 42* 43*  GLUCOSE  --   < > 67 129* 165* 222* 148*  BUN  --   < > 24* 26* 20 21* 21*  CREATININE  --   < > 0.82 0.92 0.77 0.83 0.78  CALCIUM  --   < > 8.4* 8.3* 8.2* 8.2* 8.0*  MG 1.4*  --  1.6* 1.5* 1.6*  --  1.6*  < > = values in this interval not displayed. GFR: Estimated Creatinine Clearance: 95.7 mL/min (by C-G formula based on SCr of 0.78 mg/dL). Liver Function Tests:  Recent Labs Lab 06/10/16 1107 06/11/16 1044  AST 16 14*  ALT 15* 13*  ALKPHOS 62 56  BILITOT 0.5 0.5  PROT 6.9 6.4*  ALBUMIN 2.7* 2.4*   No results for input(s): LIPASE, AMYLASE in the last 168 hours. No results for input(s): AMMONIA in the last 168 hours. Coagulation Profile:  Recent Labs Lab 06/10/16 1107  INR 1.13    Cardiac Enzymes:  Recent Labs Lab 06/10/16 1107  TROPONINI <0.03   BNP (last 3 results)  Recent Labs  02/17/16 0948 05/05/16 1450  PROBNP 40.0 17.0   HbA1C: No results for input(s): HGBA1C in the last 72 hours. CBG:  Recent Labs Lab 06/14/16 1231 06/14/16 1649 06/14/16 2136 06/15/16 0321 06/15/16 0729  GLUCAP 234* 165* 249* 165* 156*   Lipid Profile: No results for input(s): CHOL, HDL, LDLCALC, TRIG, CHOLHDL, LDLDIRECT in the last 72 hours. Thyroid Function Tests: No results for input(s): TSH, T4TOTAL, FREET4, T3FREE, THYROIDAB in the last 72 hours. Anemia Panel: No results for input(s): VITAMINB12, FOLATE,  FERRITIN, TIBC, IRON, RETICCTPCT in the last 72 hours. Urine analysis:    Component Value Date/Time   COLORURINE YELLOW 06/10/2016 1155   APPEARANCEUR CLEAR 06/10/2016 1155   LABSPEC 1.010 06/10/2016 1155   PHURINE 5.0 06/10/2016 1155   GLUCOSEU NEGATIVE 06/10/2016 1155   HGBUR MODERATE (A) 06/10/2016 1155   BILIRUBINUR NEGATIVE 06/10/2016 1155   KETONESUR NEGATIVE 06/10/2016 1155   PROTEINUR NEGATIVE 06/10/2016 1155   UROBILINOGEN 1.0 11/22/2008 1922   NITRITE NEGATIVE 06/10/2016 1155   LEUKOCYTESUR LARGE (A) 06/10/2016 1155    Recent Results (from the past 240 hour(s))  Culture, blood (Routine x 2)     Status: None (Preliminary result)   Collection Time: 06/10/16 11:12 AM  Result Value Ref Range Status   Specimen Description BLOOD LEFT ANTECUBITAL  Final   Special Requests BOTTLES DRAWN AEROBIC AND ANAEROBIC 10CC  Final   Culture   Final    NO GROWTH 4 DAYS Performed at [facility], [place] [address], [place], [place] [address]    Report Status PENDING  Incomplete  Culture, blood (Routine x 2)     Status: None (Preliminary result)   Collection Time: 06/10/16 11:14 AM  Result Value Ref Range Status   Specimen Description BLOOD RIGHT ANTECUBITAL  Final   Special Requests BOTTLES DRAWN AEROBIC AND ANAEROBIC 5CC  Final   Culture   Final     NO GROWTH 4 DAYS Performed at [facility], [place] [address], [place], [place] [address]    Report Status PENDING  Incomplete  Urine culture     Status: Abnormal   Collection Time: 06/10/16 11:55 AM  Result Value Ref Range Status   Specimen Description URINE, RANDOM  Final   Special Requests NONE  Final   Culture MULTIPLE SPECIES PRESENT, SUGGEST RECOLLECTION (A)  Final   Report Status 06/13/2016 FINAL  Final  MRSA PCR Screening     Status: None   Collection Time: 06/10/16  4:49 PM  Result Value Ref Range Status   MRSA by PCR NEGATIVE NEGATIVE Final    Comment:        The GeneXpert MRSA Assay (FDA approved for NASAL specimens only), is one component of a comprehensive MRSA colonization surveillance program. It is not intended to diagnose MRSA infection nor to guide or monitor treatment for MRSA infections.   Body fluid culture     Status: None (Preliminary result)   Collection Time: 06/14/16  4:54 PM  Result Value Ref Range Status   Specimen Description Pleural, L  Final   Special Requests NONE  Final   Gram Stain   Final    MODERATE WBC PRESENT,BOTH PMN AND MONONUCLEAR NO ORGANISMS SEEN Performed at [facility], [address] [place], [place] [address]    Culture PENDING  Incomplete   Report Status PENDING  Incomplete   Radiology Studies: Dg Chest Port 1 View  Result Date: 06/14/2016 CLINICAL DATA:  Status post LEFT thoracentesis. EXAM: PORTABLE CHEST 1 VIEW COMPARISON:  06/12/2016. FINDINGS: The heart is enlarged. Moderate-size BILATERAL pleural effusions are present, with subsequent decrease of the LEFT pleural effusion. There is a tiny area at the LEFT lung apex which could represent a very small pneumothorax. Unchanged mild perihilar interstitial prominence. Compared with priors, the RIGHT effusion is significantly larger. Airspace opacity at the RIGHT base could represent developing infiltrate. IMPRESSION: Decrease LEFT effusion status post thoracentesis.  Concern for tiny apical pneumothorax on the LEFT. Consider follow-up film in 2-3 hours.  Findings discussed with Dr. Anselm Pancoast. Worsening aeration on the RIGHT. Most notably, increased RIGHT effusion and increasing lower lobe airspace opacity. Electronically Signed   By: Staci Righter M.D.   On: 06/14/2016 17:07   US Thoracentesis Asp Pleural Space W/img Guide  Result Date: 06/14/2016 INDICATION: 76 year old with shortness of breath and bilateral pleural effusions. Patient is being treated for ITP and has severe thrombocytopenia. Platelet infusion during the procedure. EXAM: ULTRASOUND GUIDED LEFT THORACENTESIS MEDICATIONS: None. COMPLICATIONS: None immediate. PROCEDURE: An ultrasound guided thoracentesis was thoroughly discussed with the patient and questions answered. The benefits, risks, alternatives and complications were also discussed. The patient understands and wishes to proceed with the procedure. Written consent was obtained. Ultrasound was performed to localize and mark an adequate pocket of fluid in the left chest. The area was then prepped and draped in the normal sterile fashion. 1% Lidocaine was used for local anesthesia. Under ultrasound guidance a 6 Fr Safe-T-Centesis catheter was introduced. Thoracentesis was performed. Procedure was stopped when the patient was complaining of mild chest pain. The catheter was removed and a dressing applied. FINDINGS: A total of approximately 1.5 L of dark bloody fluid was removed. Samples were sent to the laboratory as requested by the clinical team. IMPRESSION: Successful ultrasound guided left thoracentesis yielding 1.5 L of pleural fluid. Electronically Signed   By: Markus Daft M.D.   On: 06/14/2016 17:19   Scheduled Meds: . sodium chloride   Intravenous Once  . amiodarone  400 mg Oral BID  . budesonide (PULMICORT) nebulizer solution  0.25 mg Nebulization BID  . diltiazem  60 mg Oral Q6H  . famotidine  20 mg Oral QHS  . furosemide  80 mg Intravenous BID    . insulin aspart  0-20 Units Subcutaneous TID WC  . insulin aspart  0-5 Units Subcutaneous QHS  . insulin aspart  5 Units Subcutaneous TID WC  . insulin glargine  15 Units Subcutaneous QHS  . mouth rinse  15 mL Mouth Rinse BID  . pantoprazole  40 mg Oral Daily  . predniSONE  20 mg Oral Q breakfast   Continuous Infusions: . amiodarone Stopped (06/14/16 1130)  . diltiazem (CARDIZEM) infusion Stopped (06/13/16 1719)     LOS: 5 days   Critical Care Time Spent in minutes  34 minutes  Jeffre Enriques MD on 06/15/2016 at 7:50 AM  Between 7am to 7pm - Pager - [phone]  After 7pm go to [email or web address] - password [email or web address]  And look for the night coverage person covering for me after hours  [facility] Office  [phone]

## 2016-06-15 NOTE — Progress Notes (Signed)
Progress Note  Patient Name: [name] Date of Encounter: 06/15/2016  Primary Cardiologist: new - H. Cordarryl Monrreal  Subjective   No cardiac complaints. Feels medications are causing weakness.  Inpatient Medications    Scheduled Meds: . sodium chloride   Intravenous Once  . amiodarone  400 mg Oral BID  . budesonide (PULMICORT) nebulizer solution  0.25 mg Nebulization BID  . diltiazem  60 mg Oral Q6H  . famotidine  20 mg Oral QHS  . furosemide  80 mg Intravenous BID  . insulin aspart  0-20 Units Subcutaneous TID WC  . insulin aspart  0-5 Units Subcutaneous QHS  . insulin aspart  5 Units Subcutaneous TID WC  . insulin glargine  15 Units Subcutaneous QHS  . mouth rinse  15 mL Mouth Rinse BID  . pantoprazole  40 mg Oral Daily  . predniSONE  20 mg Oral Q breakfast   Continuous Infusions: . diltiazem (CARDIZEM) infusion Stopped (06/13/16 1719)   PRN Meds: adenosine (ADENOCARD) IV, diltiazem, levalbuterol   Vital Signs    Vitals:   06/15/16 0611 06/15/16 0800 06/15/16 0839 06/15/16 1157  BP: 109/76     Pulse:      Resp:      Temp:  98.6 F (37 C)  98.9 F (37.2 C)  TempSrc:  Oral  Oral  SpO2:   93%   Weight:      Height:        Intake/Output Summary (Last 24 hours) at 06/15/16 1224 Last data filed at 06/15/16 1000  Gross per 24 hour  Intake          1212.67 ml  Output             2125 ml  Net          -912.33 ml   Filed Weights   06/10/16 1048 06/10/16 1110  Weight: 211 lb (95.7 kg) 211 lb 1 oz (95.7 kg)    Telemetry    Sinus rhythm without significant SVT - Personally Reviewed  ECG    No new tracing - Personally Reviewed  Physical Exam  Frail and pale appearing GEN: No acute distress.   Neck: No JVD Cardiac: RRR, no murmurs, rubs, or gallops.  Respiratory: Clear to auscultation bilaterally. GI: Soft, nontender, non-distended  MS: No edema; No deformity. Neuro:  Nonfocal  Psych: Normal affect   Labs    Chemistry Recent Labs Lab  06/10/16 1107 06/11/16 1044  06/13/16 0344 06/14/16 0355 06/15/16 0342  NA 133*  --   < > 134* 130* 133*  K 3.9  --   < > 3.4* 5.0 3.5  CL 93*  --   < > 86* 82* 83*  CO2 31  --   < > 41* 42* 43*  GLUCOSE 240*  --   < > 165* 222* 148*  BUN 22*  --   < > 20 21* 21*  CREATININE 0.88  --   < > 0.77 0.83 0.78  CALCIUM 8.3*  --   < > 8.2* 8.2* 8.0*  PROT 6.9 6.4*  --   --   --   --   ALBUMIN 2.7* 2.4*  --   --   --   --   AST 16 14*  --   --   --   --   ALT 15* 13*  --   --   --   --   ALKPHOS 62 56  --   --   --   --  BILITOT 0.5 0.5  --   --   --   --   GFRNONAA >60  --   < > >60 >60 >60  GFRAA >60  --   < > >60 >60 >60  ANIONGAP 9  --   < > 7 6 7   < > = values in this interval not displayed.   Hematology Recent Labs Lab 06/13/16 0344 06/14/16 0355 06/15/16 0342  WBC 11.6* 9.8 8.8  RBC 3.12* 3.08* 2.99*  HGB 9.0* 8.9* 8.6*  HCT 28.7* 28.4* 27.7*  MCV 92.0 92.2 92.6  MCH 28.8 28.9 28.8  MCHC 31.4 31.3 31.0  RDW 15.5 14.8 14.8  PLT 5* 13* 10*    Cardiac Enzymes Recent Labs Lab 06/10/16 1107  TROPONINI <0.03   No results for input(s): TROPIPOC in the last 168 hours.   BNP Recent Labs Lab 06/10/16 1107  BNP 109.9*     DDimer No results for input(s): DDIMER in the last 168 hours.   Radiology    Dg Chest Port 1 View  Result Date: 06/15/2016 CLINICAL DATA:  Follow-up left pneumothorax and pleural effusion EXAM: PORTABLE CHEST 1 VIEW COMPARISON:  Chest radiograph from one day prior. FINDINGS: Stable cardiomediastinal silhouette with normal heart size. No appreciable residual pneumothorax. Stable small bilateral pleural effusions. No pulmonary edema. Low lung volumes. Stable bibasilar lung opacities. IMPRESSION: 1. No appreciable residual pneumothorax . 2. Stable low lung volumes, small bilateral pleural effusions and bibasilar lung opacities likely representing atelectasis. Electronically Signed   By: Ilona Sorrel M.D.   On: 06/15/2016 08:06   Dg Chest Port 1  View  Result Date: 06/14/2016 CLINICAL DATA:  Status post LEFT thoracentesis. EXAM: PORTABLE CHEST 1 VIEW COMPARISON:  06/12/2016. FINDINGS: The heart is enlarged. Moderate-size BILATERAL pleural effusions are present, with subsequent decrease of the LEFT pleural effusion. There is a tiny area at the LEFT lung apex which could represent a very small pneumothorax. Unchanged mild perihilar interstitial prominence. Compared with priors, the RIGHT effusion is significantly larger. Airspace opacity at the RIGHT base could represent developing infiltrate. IMPRESSION: Decrease LEFT effusion status post thoracentesis. Concern for tiny apical pneumothorax on the LEFT. Consider follow-up film in 2-3 hours. Findings discussed with Dr. Anselm Pancoast. Worsening aeration on the RIGHT. Most notably, increased RIGHT effusion and increasing lower lobe airspace opacity. Electronically Signed   By: Staci Righter M.D.   On: 06/14/2016 17:07   US Thoracentesis Asp Pleural Space W/img Guide  Result Date: 06/14/2016 INDICATION: 76 year old with shortness of breath and bilateral pleural effusions. Patient is being treated for ITP and has severe thrombocytopenia. Platelet infusion during the procedure. EXAM: ULTRASOUND GUIDED LEFT THORACENTESIS MEDICATIONS: None. COMPLICATIONS: None immediate. PROCEDURE: An ultrasound guided thoracentesis was thoroughly discussed with the patient and questions answered. The benefits, risks, alternatives and complications were also discussed. The patient understands and wishes to proceed with the procedure. Written consent was obtained. Ultrasound was performed to localize and mark an adequate pocket of fluid in the left chest. The area was then prepped and draped in the normal sterile fashion. 1% Lidocaine was used for local anesthesia. Under ultrasound guidance a 6 Fr Safe-T-Centesis catheter was introduced. Thoracentesis was performed. Procedure was stopped when the patient was complaining of mild chest  pain. The catheter was removed and a dressing applied. FINDINGS: A total of approximately 1.5 L of dark bloody fluid was removed. Samples were sent to the laboratory as requested by the clinical team. IMPRESSION: Successful ultrasound guided left thoracentesis yielding 1.5  L of pleural fluid. Electronically Signed   By: Markus Daft M.D.   On: 06/14/2016 17:19    Cardiac Studies   Echocardiogram 06/11/16:  Study Conclusions  - Left ventricle: The cavity size was normal. There was moderate   concentric hypertrophy. Systolic function was normal. The   estimated ejection fraction was in the range of 55% to 60%. Wall   motion was normal; there were no regional wall motion   abnormalities. Doppler parameters are consistent with abnormal   left ventricular relaxation (grade 1 diastolic dysfunction). - Aortic valve: Trileaflet; moderately thickened, moderately   calcified leaflets. Valve mobility was restricted. - Tricuspid valve: There was mild regurgitation. - Pericardium, extracardiac: There was a small left pleural   effusion with rounded mass like confluence adjacent to LV apical   free wall. .  Impressions:  - PSVT noted during study (180bpm)   Patient Profile     76 y.o. male with a history of ITP (recent dx), HLD, DM, bronchiectasis, was admitted 02/10 w/ SOB, plt <5, pleural effusion. Pt in/out of SVT, but episodes have slowed with the addition of Amio/Cardizem.   Assessment & Plan    1. 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 Will decrease intensity of antiarrhythmic therapy, by discontinuing diltiazem and decreasing the dose of amiodarone. 2. Pleural effusions. Did undergo therapeutic thoracentesis yesterday.  Signed, Sinclair Grooms, MD  06/15/2016, 12:24 PM

## 2016-06-16 LAB — GLUCOSE, CAPILLARY
GLUCOSE-CAPILLARY: 182 mg/dL — AB (ref 65–99)
GLUCOSE-CAPILLARY: 209 mg/dL — AB (ref 65–99)
Glucose-Capillary: 217 mg/dL — ABNORMAL HIGH (ref 65–99)

## 2016-06-16 LAB — BASIC METABOLIC PANEL
ANION GAP: 9 (ref 5–15)
BUN: 21 mg/dL — ABNORMAL HIGH (ref 6–20)
CHLORIDE: 82 mmol/L — AB (ref 101–111)
CO2: 45 mmol/L — AB (ref 22–32)
CREATININE: 0.87 mg/dL (ref 0.61–1.24)
Calcium: 8.3 mg/dL — ABNORMAL LOW (ref 8.9–10.3)
GFR calc Af Amer: >60 mL/min (ref >60)
GFR calc non Af Amer: >60 mL/min (ref >60)
Glucose, Bld: 162 mg/dL — ABNORMAL HIGH (ref 65–99)
Potassium: 3.6 mmol/L (ref 3.5–5.1)
SODIUM: 136 mmol/L (ref 135–145)

## 2016-06-16 LAB — CBC
HEMATOCRIT: 27.6 % — AB (ref 39.0–52.0)
HEMOGLOBIN: 8.5 g/dL — AB (ref 13.0–17.0)
MCH: 28.5 pg (ref 26.0–34.0)
MCHC: 30.8 g/dL (ref 30.0–36.0)
MCV: 92.6 fL (ref 78.0–100.0)
Platelets: <5 10*3/uL — CL (ref 150–400)
RBC: 2.98 MIL/uL — ABNORMAL LOW (ref 4.22–5.81)
RDW: 14.5 % (ref 11.5–15.5)
WBC: 7.8 10*3/uL (ref 4.0–10.5)

## 2016-06-16 LAB — TYPE AND SCREEN
ABO/RH(D): A POS
Antibody Screen: NEGATIVE

## 2016-06-16 LAB — MAGNESIUM: MAGNESIUM: 1.8 mg/dL (ref 1.7–2.4)

## 2016-06-16 MED ORDER — AMIODARONE HCL 200 MG PO TABS
200.0000 mg | ORAL_TABLET | Freq: Every day | ORAL | Status: DC
  Administered 2016-06-17 – 2016-06-20 (×4): 200 mg via ORAL
  Filled 2016-06-16 (×4): qty 1

## 2016-06-16 MED ORDER — SODIUM CHLORIDE 0.9 % IV SOLN
Freq: Once | INTRAVENOUS | Status: AC
  Administered 2016-06-16: 08:00:00 via INTRAVENOUS

## 2016-06-16 MED ORDER — INSULIN GLARGINE 100 UNIT/ML ~~LOC~~ SOLN
22.0000 [IU] | Freq: Every day | SUBCUTANEOUS | Status: DC
  Administered 2016-06-16: 22 [IU] via SUBCUTANEOUS
  Filled 2016-06-16 (×3): qty 0.22

## 2016-06-16 MED ORDER — INSULIN ASPART 100 UNIT/ML ~~LOC~~ SOLN
8.0000 [IU] | Freq: Three times a day (TID) | SUBCUTANEOUS | Status: DC
  Administered 2016-06-16 – 2016-06-19 (×8): 8 [IU] via SUBCUTANEOUS

## 2016-06-16 NOTE — Progress Notes (Signed)
Pt refusing AM amiodarone.  Pt educated on importance of taking medications as prescribed. Clovia Cuff made aware.  Will continue to monitor closely.

## 2016-06-16 NOTE — Evaluation (Signed)
Physical Therapy Evaluation Patient Details Name: [name] MRN: [id number] DOB: June 24, 1940 Today's Date: 06/16/2016   History of Present Illness  [name] is a 76 y.o. male with medical history significant of recently diagnosed with ITP, bronchiectasis, hypertension, diabetes mellitus type 2. Presented to the ED complaining of shortness of breath for the past week   Clinical Impression  Pt admitted with above diagnosis. Pt currently with functional limitations due to the deficits listed below (see PT Problem List).  Pt will benefit from skilled PT to increase their independence and safety with mobility to allow discharge to the venue listed below.   Recommend  SNF post acute--pt  Is globally weak and deconditioned, very limited gait distance with significant drop in sats; wife recently had surgery and is unable to physically assist pt, will follow in acute setting    Follow Up Recommendations SNF    Equipment Recommendations  None recommended by PT    Recommendations for Other Services       Precautions / Restrictions Precautions Precautions: Fall      Mobility  Bed Mobility               General bed mobility comments: pt in chair  Transfers Overall transfer level: Needs assistance Equipment used: Rolling walker (2 wheeled) Transfers: Sit to/from Stand Sit to Stand: Min assist         General transfer comment: cues for hand placement, safety, control of descent  Ambulation/Gait Ambulation/Gait assistance: Min assist Ambulation Distance (Feet): 30 Feet Assistive device: Rolling walker (2 wheeled) Gait Pattern/deviations: Step-through pattern;Decreased stride length;Trunk flexed;Wide base of support     General Gait Details: pt fatigues rapidly, requires cues for RW safety and breathing; O2 sats maintained >91% on RA until pt returned to chair and sats quickly dropped to 78%, O2 replaced at 2L--encouraged correct breathing techniques and posture, sats  incr to >92% on 2L  Stairs            Wheelchair Mobility    Modified Rankin (Stroke Patients Only)       Balance Overall balance assessment: Needs assistance   Sitting balance-Leahy Scale: Fair       Standing balance-Leahy Scale: Poor                               Pertinent Vitals/Pain Pain Assessment: No/denies pain    Home Living Family/patient expects to be discharged to:: Private residence Living Arrangements: Spouse/significant other   Type of Home: House (townhouse) Home Access: Level entry     Home Layout: Two level;Able to live on main level with bedroom/bathroom Home Equipment: Kasandra Knudsen - single point;Walker - 2 wheels;Shower seat (wife's DME) Additional Comments: adjustable bed is upstairs; pt walking 2-3 miles a day until a few months ago    Prior Function Level of Independence: Independent               Hand Dominance        Extremity/Trunk Assessment   Upper Extremity Assessment Upper Extremity Assessment: Generalized weakness    Lower Extremity Assessment Lower Extremity Assessment: Generalized weakness       Communication   Communication: No difficulties  Cognition Arousal/Alertness: Awake/alert Behavior During Therapy: WFL for tasks assessed/performed;Flat affect Overall Cognitive Status: Within Functional Limits for tasks assessed                      General Comments General comments (skin  integrity, edema, etc.): pt c/o edema bil feet     Exercises     Assessment/Plan    PT Assessment Patient needs continued PT services  PT Problem List Decreased strength;Decreased activity tolerance;Decreased mobility;Cardiopulmonary status limiting activity          PT Treatment Interventions DME instruction;Gait training;Functional mobility training;Therapeutic activities;Therapeutic exercise;Patient/family education    PT Goals (Current goals can be found in the Care Plan section)  Acute Rehab PT  Goals Patient Stated Goal: feel better PT Goal Formulation: With patient/family Time For Goal Achievement: 06/23/16 Potential to Achieve Goals: Good    Frequency Min 3X/week   Barriers to discharge        Co-evaluation               End of Session Equipment Utilized During Treatment: Gait belt Activity Tolerance: Patient tolerated treatment well;Patient limited by fatigue Patient left: in chair;with call bell/phone within reach;with family/visitor present;with chair alarm set           Time: [id number] PT Time Calculation (min) (ACUTE ONLY): 24 min   Charges:   PT Evaluation $PT Eval Moderate Complexity: 1 Procedure PT Treatments $Gait Training: 8-22 mins   PT G Codes:        Zemirah Krasinski Jun 24, 2016, 1:29 PM

## 2016-06-16 NOTE — Progress Notes (Signed)
The patient has been seen in conjunction with Reino Bellis, NP-C. All aspects of care have been considered and discussed. The patient has been personally interviewed, examined, and all clinical data has been reviewed.   Believe SVT was acute illness related.  We will sign off.  Amiodarone can be stopped at discharge.   Progress Note  Patient Name: [name] Date of Encounter: 06/16/2016  Primary Cardiologist: new - H. Paiton [facility]  Subjective   Breathing better today, still feels like cardiac medications are making him weak. But improved from yesterday.   Inpatient Medications    Scheduled Meds: . sodium chloride   Intravenous Once  . sodium chloride   Intravenous Once  . aminocaproic acid  1,000 mg Oral Q8H  . amiodarone  200 mg Oral BID  . budesonide (PULMICORT) nebulizer solution  0.25 mg Nebulization BID  . famotidine  20 mg Oral QHS  . furosemide  40 mg Oral Daily  . insulin aspart  0-20 Units Subcutaneous TID WC  . insulin aspart  0-5 Units Subcutaneous QHS  . insulin aspart  6 Units Subcutaneous TID WC  . insulin glargine  18 Units Subcutaneous QHS  . mouth rinse  15 mL Mouth Rinse BID  . pantoprazole  40 mg Oral Daily  . predniSONE  20 mg Oral Q breakfast   Continuous Infusions: . diltiazem (CARDIZEM) infusion Stopped (06/13/16 1719)   PRN Meds: adenosine (ADENOCARD) IV, levalbuterol   Vital Signs    Vitals:   06/15/16 1906 06/15/16 2020 06/15/16 2150 06/16/16 0335  BP:  (!) 107/49 (!) 114/50 (!) 114/55  Pulse:  83 84 76  Resp:  (!) 21 (!) 24 17  Temp: 98.7 F (37.1 C) 98.8 F (37.1 C)  97.4 F (36.3 C)  TempSrc: Oral Oral  Oral  SpO2:  99% 98% 99%  Weight:      Height:        Intake/Output Summary (Last 24 hours) at 06/16/16 0830 Last data filed at 06/16/16 0600  Gross per 24 hour  Intake              420 ml  Output             1700 ml  Net            -1280 ml   Filed Weights   06/10/16 1048 06/10/16 1110  Weight: 211 lb (95.7 kg)  211 lb 1 oz (95.7 kg)    Telemetry    SR no SVT - Personally Reviewed  ECG    No new tracing - Personally Reviewed  Physical Exam  Frail and pale appearing GEN: No acute distress. Remains on [place]  Neck: No JVD Cardiac: RRR, no murmurs, rubs, or gallops.  Respiratory: Course Rhonchi throughout GI: Soft, nontender, non-distended  MS: No edema; No deformity. Neuro:  Nonfocal  Psych: Normal affect   Labs    Chemistry Recent Labs Lab 06/10/16 1107 06/11/16 1044  06/14/16 0355 06/15/16 0342 06/16/16 0353  NA 133*  --   < > 130* 133* 136  K 3.9  --   < > 5.0 3.5 3.6  CL 93*  --   < > 82* 83* 82*  CO2 31  --   < > 42* 43* 45*  GLUCOSE 240*  --   < > 222* 148* 162*  BUN 22*  --   < > 21* 21* 21*  CREATININE 0.88  --   < > 0.83 0.78 0.87  CALCIUM 8.3*  --   < >  8.2* 8.0* 8.3*  PROT 6.9 6.4*  --   --   --   --   ALBUMIN 2.7* 2.4*  --   --   --   --   AST 16 14*  --   --   --   --   ALT 15* 13*  --   --   --   --   ALKPHOS 62 56  --   --   --   --   BILITOT 0.5 0.5  --   --   --   --   GFRNONAA >60  --   < > >60 >60 >60  GFRAA >60  --   < > >60 >60 >60  ANIONGAP 9  --   < > 6 7 9   < > = values in this interval not displayed.   Hematology  Recent Labs Lab 06/14/16 0355 06/15/16 0342 06/16/16 0353  WBC 9.8 8.8 7.8  RBC 3.08* 2.99* 2.98*  HGB 8.9* 8.6* 8.5*  HCT 28.4* 27.7* 27.6*  MCV 92.2 92.6 92.6  MCH 28.9 28.8 28.5  MCHC 31.3 31.0 30.8  RDW 14.8 14.8 14.5  PLT 13* 10* <5*    Cardiac Enzymes  Recent Labs Lab 06/10/16 1107  TROPONINI <0.03   No results for input(s): TROPIPOC in the last 168 hours.   BNP  Recent Labs Lab 06/10/16 1107  BNP 109.9*     DDimer No results for input(s): DDIMER in the last 168 hours.   Radiology    Dg Chest Port 1 View  Result Date: 06/15/2016 CLINICAL DATA:  Follow-up left pneumothorax and pleural effusion EXAM: PORTABLE CHEST 1 VIEW COMPARISON:  Chest radiograph from one day prior. FINDINGS: Stable  cardiomediastinal silhouette with normal heart size. No appreciable residual pneumothorax. Stable small bilateral pleural effusions. No pulmonary edema. Low lung volumes. Stable bibasilar lung opacities. IMPRESSION: 1. No appreciable residual pneumothorax . 2. Stable low lung volumes, small bilateral pleural effusions and bibasilar lung opacities likely representing atelectasis. Electronically Signed   By: Ilona Sorrel M.D.   On: 06/15/2016 08:06   Dg Chest Port 1 View  Result Date: 06/14/2016 CLINICAL DATA:  Status post LEFT thoracentesis. EXAM: PORTABLE CHEST 1 VIEW COMPARISON:  06/12/2016. FINDINGS: The heart is enlarged. Moderate-size BILATERAL pleural effusions are present, with subsequent decrease of the LEFT pleural effusion. There is a tiny area at the LEFT lung apex which could represent a very small pneumothorax. Unchanged mild perihilar interstitial prominence. Compared with priors, the RIGHT effusion is significantly larger. Airspace opacity at the RIGHT base could represent developing infiltrate. IMPRESSION: Decrease LEFT effusion status post thoracentesis. Concern for tiny apical pneumothorax on the LEFT. Consider follow-up film in 2-3 hours. Findings discussed with Dr. Anselm Pancoast. Worsening aeration on the RIGHT. Most notably, increased RIGHT effusion and increasing lower lobe airspace opacity. Electronically Signed   By: Staci Righter M.D.   On: 06/14/2016 17:07   US Thoracentesis Asp Pleural Space W/img Guide  Result Date: 06/14/2016 INDICATION: 76 year old with shortness of breath and bilateral pleural effusions. Patient is being treated for ITP and has severe thrombocytopenia. Platelet infusion during the procedure. EXAM: ULTRASOUND GUIDED LEFT THORACENTESIS MEDICATIONS: None. COMPLICATIONS: None immediate. PROCEDURE: An ultrasound guided thoracentesis was thoroughly discussed with the patient and questions answered. The benefits, risks, alternatives and complications were also discussed. The  patient understands and wishes to proceed with the procedure. Written consent was obtained. Ultrasound was performed to localize and mark an adequate pocket of fluid in the  left chest. The area was then prepped and draped in the normal sterile fashion. 1% Lidocaine was used for local anesthesia. Under ultrasound guidance a 6 Fr Safe-T-Centesis catheter was introduced. Thoracentesis was performed. Procedure was stopped when the patient was complaining of mild chest pain. The catheter was removed and a dressing applied. FINDINGS: A total of approximately 1.5 L of dark bloody fluid was removed. Samples were sent to the laboratory as requested by the clinical team. IMPRESSION: Successful ultrasound guided left thoracentesis yielding 1.5 L of pleural fluid. Electronically Signed   By: Markus Daft M.D.   On: 06/14/2016 17:19    Cardiac Studies   Echocardiogram 06/11/16:  Study Conclusions  - Left ventricle: The cavity size was normal. There was moderate   concentric hypertrophy. Systolic function was normal. The   estimated ejection fraction was in the range of 55% to 60%. Wall   motion was normal; there were no regional wall motion   abnormalities. Doppler parameters are consistent with abnormal   left ventricular relaxation (grade 1 diastolic dysfunction). - Aortic valve: Trileaflet; moderately thickened, moderately   calcified leaflets. Valve mobility was restricted. - Tricuspid valve: There was mild regurgitation. - Pericardium, extracardiac: There was a small left pleural   effusion with rounded mass like confluence adjacent to LV apical   free wall. .  Impressions:  - PSVT noted during study (180bpm)   Patient Profile     76 y.o. male with a history of ITP (recent dx), HLD, DM, bronchiectasis, was admitted 02/10 w/ SOB, plt <5, pleural effusion. Pt in/out of SVT, but episodes have slowed with the addition of Amio/Cardizem.   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 Amiodarone can be discontinued at discharge.  2. Pleural effusions. Did undergo therapeutic thoracentesis 06/14/16. Reports breathing easier. Platelet count down again, planned for 3 units platelets today.   -transferring to telemetry   Signed, Reino Bellis, NP  06/16/2016, 8:30 AM

## 2016-06-16 NOTE — Progress Notes (Signed)
PROGRESS NOTE    [name]  [id number] DOB: January 10, 1941 DOA: 06/10/2016 PCP: [name], MD   Chief Complaint  Patient presents with  . chemo patient  . Shortness of Breath    Brief Narrative:  HPI on 06/10/2016 by Dr. Chipper Oman [name] is a 76 y.o. male with medical history significant of recently diagnosed with ITP, bronchiectasis, hypertension, diabetes mellitus type 2. Presented to the ED complaining of shortness of breath for the past week has been worsening with no improvement after inhaler therapies. Patient was seen by his hematologist yesterday ordered a CT scan which showed increasing pleural effusion from previous studies. Since seen by pulmonologist, who has him on prednisone taper and multiple inhalers. Hematology giving him IVIG infusions with poor response.  Interim history  Admitted for pleural effusion. Attempting to diurese patient. Patient developed SVT, cardiology consulted.  Hem/Onc consulted for platelets <5. Given 2u on 2/12.  S/p thoracentesis 2/14.  Additional platelet transfusion 2/16.    Assessment & Plan   Pleural effusion -CXR showed pleural effusions -CT chest showed large bilateral pleural effusions -Admitting Physician discuss with Dr. Ashok Cordia with PCCM, patient not a candidate for thoracentesis until platelet count improves. They recommended aggressive diuresis. If patient became unstable, transfuse platelets and placed chest tube. -Placed on Lasix, will continue for now -Monitor intake and output, down 4.5L since admission -Ordered a repeat CXR that did show significant improvement  PSVT -Patient initially has had heart rates in the 180s. -Echocardiogram EF 0000000, grade 1 diastolic dysfunction -Magnesium being repleted -Potassium being repleted -Cardiology consulted and appreciated -Continue amiodarone per cardiology service. -HR better controlled  Urinary tract infection -As above -Treated with ceftriaxone  IV  Bronchiectasis -Patient has been on high-dose prednisone in the past -Continue prednisone 20 mg daily per hem/onc -Continue nebs, Pulmicort, supplemental oxygen (Pt has been refusing nebs) -Pulmonology consulted but not following  ITP -Patient follows with Dr. [name], oncology. Spoke with Dr. [name] 2/12. -Has been receiving IVIG infusions/ rituximab -platelets 10 now, no active bleeding -Continue to monitor CBC -Dr. [name] consulted and appreciated - Platelets less than 5 today, transfuse 3 units of platelets 2/16  Diabetes mellitus, type 2 complicated by hypoglycemia -Metformin held, continue insulin sliding scale and CBG monitoring -blood sugars better controlled, follow closely CBG (last 3)   Recent Labs  06/15/16 1720 06/15/16 2128 06/16/16 0723  GLUCAP 233* 193* 182*   Hypomagnesemia - IV mag sulfate given 2/15.  Repeat test pending.    DVT Prophylaxis  SCDs  Code Status: Full  Family Communication: bedside  Disposition Plan: Transfer to tele, likely can dc home 2/17  Consultants Cardiology PCCM Oncology, Dr. [name]  Procedures  Thoracentesis 2/14  Antibiotics   Anti-infectives    Start     Dose/Rate Route Frequency Ordered Stop   06/12/16 0615  cefTRIAXone (ROCEPHIN) 1 g in dextrose 5 % 50 mL IVPB  Status:  Discontinued     1 g 100 mL/hr over 30 Minutes Intravenous Daily 06/12/16 0604 06/14/16 0859   06/10/16 1345  cefTRIAXone (ROCEPHIN) 1 g in dextrose 5 % 50 mL IVPB     1 g 100 mL/hr over 30 Minutes Intravenous  Once 06/10/16 1338 06/10/16 1506      Subjective:   [name] seen and examined today.  Pt reports that breathing is better, he is being weaned off oxygen now.      Objective:   Vitals:   06/15/16 1906 06/15/16 2020 06/15/16 2150 06/16/16 [id number]  BP:  (!) 107/49 (!) 114/50 (!) 114/55  Pulse:  83 84 76  Resp:  (!) 21 (!) 24 17  Temp: 98.7 F (37.1 C) 98.8 F (37.1 C)  97.4 F (36.3 C)  TempSrc: Oral Oral  Oral  SpO2:   99% 98% 99%  Weight:      Height:        Intake/Output Summary (Last 24 hours) at 06/16/16 0749 Last data filed at 06/16/16 0600  Gross per 24 hour  Intake              620 ml  Output             1950 ml  Net            -1330 ml   Filed Weights   06/10/16 1048 06/10/16 1110  Weight: 95.7 kg (211 lb) 95.7 kg (211 lb 1 oz)    Exam  General: Well developed, well nourished, no distress  HEENT: NCAT, mucous membranes moist.   Cardiovascular: S1 S2 normal with systolic murmur  Respiratory: shallow BS bilateral.  Abdomen: Soft, nontender, nondistended, + bowel sounds  Extremities: warm dry without cyanosis clubbing. +2LE  edema B/L  Neuro: AAOx3, nonfocal  Psych: Appropriate   Data Reviewed: I have personally reviewed following labs and imaging studies  CBC:  Recent Labs Lab 06/10/16 1107  06/12/16 0422 06/13/16 0344 06/14/16 0355 06/15/16 0342 06/16/16 0353  WBC 16.2*  < > 19.6* 11.6* 9.8 8.8 7.8  NEUTROABS 15.2*  --   --   --   --  7.4  --   HGB 10.4*  < > 9.7* 9.0* 8.9* 8.6* 8.5*  HCT 32.6*  < > 31.2* 28.7* 28.4* 27.7* 27.6*  MCV 91.3  < > 92.3 92.0 92.2 92.6 92.6  PLT <5*  < > <5* 5* 13* 10* <5*  < > = values in this interval not displayed. Basic Metabolic Panel:  Recent Labs Lab 06/10/16 2254  06/12/16 0024 06/12/16 0422 06/13/16 0344 06/14/16 0355 06/15/16 0342 06/16/16 0353  NA  --   < > 137 135 134* 130* 133* 136  K  --   < > 3.5 3.7 3.4* 5.0 3.5 3.6  CL  --   < > 92* 90* 86* 82* 83* 82*  CO2  --   < > 39* 35* 41* 42* 43* 45*  GLUCOSE  --   < > 67 129* 165* 222* 148* 162*  BUN  --   < > 24* 26* 20 21* 21* 21*  CREATININE  --   < > 0.82 0.92 0.77 0.83 0.78 0.87  CALCIUM  --   < > 8.4* 8.3* 8.2* 8.2* 8.0* 8.3*  MG 1.4*  --  1.6* 1.5* 1.6*  --  1.6*  --   < > = values in this interval not displayed. GFR: Estimated Creatinine Clearance: 88 mL/min (by C-G formula based on SCr of 0.87 mg/dL). Liver Function Tests:  Recent Labs Lab  06/10/16 1107 06/11/16 1044  AST 16 14*  ALT 15* 13*  ALKPHOS 62 56  BILITOT 0.5 0.5  PROT 6.9 6.4*  ALBUMIN 2.7* 2.4*   No results for input(s): LIPASE, AMYLASE in the last 168 hours. No results for input(s): AMMONIA in the last 168 hours. Coagulation Profile:  Recent Labs Lab 06/10/16 1107  INR 1.13   Cardiac Enzymes:  Recent Labs Lab 06/10/16 1107  TROPONINI <0.03   BNP (last 3 results)  Recent Labs  02/17/16 0948 05/05/16 1450  PROBNP 40.0 17.0   HbA1C: No results for input(s): HGBA1C in the last 72 hours. CBG:  Recent Labs Lab 06/15/16 0729 06/15/16 1108 06/15/16 1720 06/15/16 2128 06/16/16 0723  GLUCAP 156* 232* 233* 193* 182*   Lipid Profile: No results for input(s): CHOL, HDL, LDLCALC, TRIG, CHOLHDL, LDLDIRECT in the last 72 hours. Thyroid Function Tests: No results for input(s): TSH, T4TOTAL, FREET4, T3FREE, THYROIDAB in the last 72 hours. Anemia Panel: No results for input(s): VITAMINB12, FOLATE, FERRITIN, TIBC, IRON, RETICCTPCT in the last 72 hours. Urine analysis:    Component Value Date/Time   COLORURINE YELLOW 06/10/2016 1155   APPEARANCEUR CLEAR 06/10/2016 1155   LABSPEC 1.010 06/10/2016 1155   PHURINE 5.0 06/10/2016 1155   GLUCOSEU NEGATIVE 06/10/2016 1155   HGBUR MODERATE (A) 06/10/2016 1155   BILIRUBINUR NEGATIVE 06/10/2016 1155   KETONESUR NEGATIVE 06/10/2016 1155   PROTEINUR NEGATIVE 06/10/2016 1155   UROBILINOGEN 1.0 11/22/2008 1922   NITRITE NEGATIVE 06/10/2016 1155   LEUKOCYTESUR LARGE (A) 06/10/2016 1155    Recent Results (from the past 240 hour(s))  Culture, blood (Routine x 2)     Status: None   Collection Time: 06/10/16 11:12 AM  Result Value Ref Range Status   Specimen Description BLOOD LEFT ANTECUBITAL  Final   Special Requests BOTTLES DRAWN AEROBIC AND ANAEROBIC 10CC  Final   Culture   Final    NO GROWTH 5 DAYS Performed at [facility], [place] [address], [place], [place] [address]    Report Status  06/15/2016 FINAL  Final  Culture, blood (Routine x 2)     Status: None   Collection Time: 06/10/16 11:14 AM  Result Value Ref Range Status   Specimen Description BLOOD RIGHT ANTECUBITAL  Final   Special Requests BOTTLES DRAWN AEROBIC AND ANAEROBIC 5CC  Final   Culture   Final    NO GROWTH 5 DAYS Performed at [facility], [place] [address] [place], [place] [address]    Report Status 06/15/2016 FINAL  Final  Urine culture     Status: Abnormal   Collection Time: 06/10/16 11:55 AM  Result Value Ref Range Status   Specimen Description URINE, RANDOM  Final   Special Requests NONE  Final   Culture MULTIPLE SPECIES PRESENT, SUGGEST RECOLLECTION (A)  Final   Report Status 06/13/2016 FINAL  Final  MRSA PCR Screening     Status: None   Collection Time: 06/10/16  4:49 PM  Result Value Ref Range Status   MRSA by PCR NEGATIVE NEGATIVE Final    Comment:        The GeneXpert MRSA Assay (FDA approved for NASAL specimens only), is one component of a comprehensive MRSA colonization surveillance program. It is not intended to diagnose MRSA infection nor to guide or monitor treatment for MRSA infections.   Body fluid culture     Status: None (Preliminary result)   Collection Time: 06/14/16  4:54 PM  Result Value Ref Range Status   Specimen Description Pleural, L  Final   Special Requests NONE  Final   Gram Stain   Final    MODERATE WBC PRESENT,BOTH PMN AND MONONUCLEAR NO ORGANISMS SEEN    Culture   Final    NO GROWTH < 24 HOURS Performed at [facility] [address] [place], [place] [address]    Report Status PENDING  Incomplete   Radiology Studies: Dg Chest Port 1 View  Result Date: 06/15/2016 CLINICAL DATA:  Follow-up left pneumothorax and pleural  effusion EXAM: PORTABLE CHEST 1 VIEW COMPARISON:  Chest radiograph from one day prior. FINDINGS: Stable cardiomediastinal silhouette with normal heart size. No appreciable residual pneumothorax. Stable small bilateral pleural  effusions. No pulmonary edema. Low lung volumes. Stable bibasilar lung opacities. IMPRESSION: 1. No appreciable residual pneumothorax . 2. Stable low lung volumes, small bilateral pleural effusions and bibasilar lung opacities likely representing atelectasis. Electronically Signed   By: Ilona Sorrel M.D.   On: 06/15/2016 08:06   Dg Chest Port 1 View  Result Date: 06/14/2016 CLINICAL DATA:  Status post LEFT thoracentesis. EXAM: PORTABLE CHEST 1 VIEW COMPARISON:  06/12/2016. FINDINGS: The heart is enlarged. Moderate-size BILATERAL pleural effusions are present, with subsequent decrease of the LEFT pleural effusion. There is a tiny area at the LEFT lung apex which could represent a very small pneumothorax. Unchanged mild perihilar interstitial prominence. Compared with priors, the RIGHT effusion is significantly larger. Airspace opacity at the RIGHT base could represent developing infiltrate. IMPRESSION: Decrease LEFT effusion status post thoracentesis. Concern for tiny apical pneumothorax on the LEFT. Consider follow-up film in 2-3 hours. Findings discussed with Dr. Anselm Pancoast. Worsening aeration on the RIGHT. Most notably, increased RIGHT effusion and increasing lower lobe airspace opacity. Electronically Signed   By: Staci Righter M.D.   On: 06/14/2016 17:07   US Thoracentesis Asp Pleural Space W/img Guide  Result Date: 06/14/2016 INDICATION: 76 year old with shortness of breath and bilateral pleural effusions. Patient is being treated for ITP and has severe thrombocytopenia. Platelet infusion during the procedure. EXAM: ULTRASOUND GUIDED LEFT THORACENTESIS MEDICATIONS: None. COMPLICATIONS: None immediate. PROCEDURE: An ultrasound guided thoracentesis was thoroughly discussed with the patient and questions answered. The benefits, risks, alternatives and complications were also discussed. The patient understands and wishes to proceed with the procedure. Written consent was obtained. Ultrasound was performed to  localize and mark an adequate pocket of fluid in the left chest. The area was then prepped and draped in the normal sterile fashion. 1% Lidocaine was used for local anesthesia. Under ultrasound guidance a 6 Fr Safe-T-Centesis catheter was introduced. Thoracentesis was performed. Procedure was stopped when the patient was complaining of mild chest pain. The catheter was removed and a dressing applied. FINDINGS: A total of approximately 1.5 L of dark bloody fluid was removed. Samples were sent to the laboratory as requested by the clinical team. IMPRESSION: Successful ultrasound guided left thoracentesis yielding 1.5 L of pleural fluid. Electronically Signed   By: Markus Daft M.D.   On: 06/14/2016 17:19   Scheduled Meds: . sodium chloride   Intravenous Once  . sodium chloride   Intravenous Once  . aminocaproic acid  1,000 mg Oral Q8H  . amiodarone  200 mg Oral BID  . budesonide (PULMICORT) nebulizer solution  0.25 mg Nebulization BID  . famotidine  20 mg Oral QHS  . furosemide  40 mg Oral Daily  . insulin aspart  0-20 Units Subcutaneous TID WC  . insulin aspart  0-5 Units Subcutaneous QHS  . insulin aspart  6 Units Subcutaneous TID WC  . insulin glargine  18 Units Subcutaneous QHS  . mouth rinse  15 mL Mouth Rinse BID  . pantoprazole  40 mg Oral Daily  . predniSONE  20 mg Oral Q breakfast   Continuous Infusions: . diltiazem (CARDIZEM) infusion Stopped (06/13/16 1719)     LOS: 6 days   Critical Care Time Spent in minutes  33 minutes  Alaena Strader MD on 06/16/2016 at 7:49 AM  Between 7am to 7pm - Pager -  [phone]  After 7pm go to [email or web address] - password [email or web address]  And look for the night coverage person covering for me after hours  [facility] Office  [phone]

## 2016-06-17 ENCOUNTER — Inpatient Hospital Stay (HOSPITAL_COMMUNITY): Payer: Medicare HMO

## 2016-06-17 LAB — GLUCOSE, CAPILLARY
GLUCOSE-CAPILLARY: 131 mg/dL — AB (ref 65–99)
GLUCOSE-CAPILLARY: 126 mg/dL — AB (ref 65–99)
Glucose-Capillary: 157 mg/dL — ABNORMAL HIGH (ref 65–99)
Glucose-Capillary: 113 mg/dL — ABNORMAL HIGH (ref 65–99)
Glucose-Capillary: 311 mg/dL — ABNORMAL HIGH (ref 65–99)
Glucose-Capillary: 277 mg/dL — ABNORMAL HIGH (ref 65–99)

## 2016-06-17 LAB — BASIC METABOLIC PANEL
ANION GAP: 8 (ref 5–15)
BUN: 19 mg/dL (ref 6–20)
CALCIUM: 8.5 mg/dL — AB (ref 8.9–10.3)
CHLORIDE: 85 mmol/L — AB (ref 101–111)
CO2: 45 mmol/L — AB (ref 22–32)
Creatinine, Ser: 0.74 mg/dL (ref 0.61–1.24)
GFR calc non Af Amer: >60 mL/min (ref >60)
GLUCOSE: 118 mg/dL — AB (ref 65–99)
POTASSIUM: 3.3 mmol/L — AB (ref 3.5–5.1)
Sodium: 138 mmol/L (ref 135–145)

## 2016-06-17 LAB — PREPARE PLATELET PHERESIS
BLOOD PRODUCT EXPIRATION DATE: 201802172359
Blood Product Expiration Date: 201802182359
Blood Product Expiration Date: 201802182359
ISSUE DATE / TIME: 201802161459
ISSUE DATE / TIME: 201802161657
ISSUE DATE / TIME: 201802161407
UNIT TYPE AND RH: 6200
Unit Type and Rh: 1700
Unit Type and Rh: 5100

## 2016-06-17 LAB — CBC
HEMATOCRIT: 26.5 % — AB (ref 39.0–52.0)
HEMOGLOBIN: 8.2 g/dL — AB (ref 13.0–17.0)
MCH: 28.6 pg (ref 26.0–34.0)
MCHC: 30.9 g/dL (ref 30.0–36.0)
MCV: 92.3 fL (ref 78.0–100.0)
Platelets: <5 10*3/uL — CL (ref 150–400)
RBC: 2.87 MIL/uL — ABNORMAL LOW (ref 4.22–5.81)
RDW: 14.3 % (ref 11.5–15.5)
WBC: 8.3 10*3/uL (ref 4.0–10.5)

## 2016-06-17 MED ORDER — POTASSIUM CHLORIDE CRYS ER 20 MEQ PO TBCR
40.0000 meq | EXTENDED_RELEASE_TABLET | Freq: Once | ORAL | Status: AC
  Administered 2016-06-17: 40 meq via ORAL
  Filled 2016-06-17: qty 2

## 2016-06-17 NOTE — Clinical Social Work Note (Signed)
Clinical Social Work Assessment  Patient Details  Name: [name] MRN: [id number] Date of Birth: 09/12/1940  Date of referral:  06/17/16               Reason for consult:  Facility Placement                Permission sought to share information with:  Family Supports, Chartered certified accountant granted to share information::  Yes, Verbal Permission Granted  Name::     [place] [facility], Wife   Housing/Transportation Living arrangements for the past 2 months:  [place] of Information:  Patient, Spouse Patient Interpreter Needed:  None Criminal Activity/Legal Involvement Pertinent to Current  No Situation/Hospitalization:  No - Comment as needed Significant Relationships:  Adult Children, Spouse (Adult children live in [place]) Lives with:  Spouse Do you feel safe going back to the place where you live?  Yes (After rehab and recovery period) Need for family participation in patient care:  Yes (Comment) (Wife is involved)  Care giving concerns:  Wife recently underwent back surgery with inpt rehab at [facility]. Still having medical difficulties and pain. She is very overwhelmed and does not feel she can managed pt's care at home at this time.  He states he was looking after her during that time.      Social Worker assessment / plan: SW met with patient and wife [name] this afternoon to discuss PT's recommendation for SNF rehab.  Patient and wife were very polite and agreed to discussion of short term rehab process.  Patient, while pleasant, was quite emphatic that he had medical issues that  Needed to be attended to in the hospital and that he would not be making any decisions without careful discuss with his MD. He agreed to initiate bed search process but will not commit to any type of placement until after first of the week.  Pt has [employer]; discussed need for prior auth and bed search process.  Wife hopes for bed in the area and does not want him  placed "too far away." Discussed bed offers, stability per MD for placement issues as well as need to find facilities that accept [facility]. They verbalized understanding. Would consider home with [facility] if they were not able to obtain placement within reasonable area.  Employment status:  Retired Forensic scientist:  Office manager) PT Recommendations:  [place] / Referral to community resources:  [place]  Patient/Family's Response to care: Pt's concern is that [employer] discussion of SNF bed search is "too premature" as he has severe medical issues with his blood that is being monitored by MD.  Wife feels that he will be "pushed out" of the hospital too soon" due to insurance.      Patient/Family's Understanding of and Emotional Response to Diagnosis, Current Treatment, and Prognosis: Pt noted to be very stoic and matter of fact about discussion of SNF placement. Patient and wife ask very good questions about SNF process.  Wife became somewhat distraught during discussion; she states she is overwhelmed with any thoughts of having to provide caregiving for patient due to her own medical issues. She began to cry and emotional support given.        Emotional Assessment Appearance:  Appears stated age Attitude/Demeanor/Rapport:  Guarded (Quiet, cooperative, firm minded) Affect (typically observed):  Calm, Guarded, Pleasant, Quiet Orientation:  Oriented to Self, Oriented to Place, Oriented to Situation, Oriented to  Time Alcohol /  Substance use:  Tobacco Use, Alcohol Use (Quit smoking 15 yrs ago; social drinker) Psych involvement (Current and /or in the community):  No (Comment)  Discharge Needs  Concerns to be addressed:  Care Coordination, Coping/Stress Concerns (Coping Stress for wife- s/p recent back surgery and inpt rehab) Readmission within the last 30 days:  No Current discharge risk:  None Barriers to Discharge:  Continued  Medical Work up   Kendell Bane T, Marlinda Mike 06/17/2016, 9:45 PM

## 2016-06-17 NOTE — Progress Notes (Signed)
PROGRESS NOTE    [name]  [id number] DOB: July 15, 1940 DOA: 06/10/2016 PCP: Stephen Seller, MD   Chief Complaint  Patient presents with  . chemo patient  . Shortness of Breath    Brief Narrative:  HPI on 06/10/2016 by [name] [name] is a 76 y.o. male with medical history significant of recently diagnosed with ITP, bronchiectasis, hypertension, diabetes mellitus type 2. Presented to the ED complaining of shortness of breath for the past week has been worsening with no improvement after inhaler therapies. Patient was seen by his hematologist yesterday ordered a CT scan which showed increasing pleural effusion from previous studies. Since seen by pulmonologist, who has him on prednisone taper and multiple inhalers. Hematology giving him IVIG infusions with poor response.  Interim history  Admitted for pleural effusion. Attempting to diurese patient. Patient developed SVT, cardiology consulted.  Hem/Onc consulted for platelets <5. Given 2u on 2/12.  S/p thoracentesis 2/14.  Additional platelet transfusion 2/16.    Assessment & Plan   Pleural effusion -Initial CXR showed pleural effusions -CT chest showed large bilateral pleural effusions  Pt had thoracentesis 2/14 with improvement.  -Admitting Physician discuss with [name] with PCCM, patient not a candidate for thoracentesis until platelet count improves. They recommended aggressive diuresis. If patient became unstable, transfuse platelets and placed chest tube. -Placed on Lasix, will continue for now -Monitor intake and output, down 4.5L since admission -Pt reporting more SOB 2/17, will order 2 view CXR and follow  PSVT -Patient initially has had heart rates in the 180s. -Echocardiogram EF 0000000, grade 1 diastolic dysfunction -Magnesium being repleted -Potassium being repleted -Cardiology consulted and appreciated -Continue amiodarone per cardiology service. -HR controlled, Pt refused to take  amiodarone 2/16 -cardiology says it can be stopped at discharge  Urinary tract infection -As above -Treated with ceftriaxone IV  Bronchiectasis -Patient has been on high-dose prednisone in the past -Continue prednisone 20 mg daily per hem/onc -Continue nebs, Pulmicort, supplemental oxygen (Pt has been refusing nebs) -Pulmonology consulted but not following  ITP -Patient follows with [name], oncology. -Has been receiving IVIG infusions/ rituximab -Continue to monitor CBC -Platelets less than 5 but no bleeding  Diabetes mellitus, type 2 complicated by hypoglycemia -Metformin held, continue insulin sliding scale and CBG monitoring -blood sugars better controlled, follow closely CBG (last 3)   Recent Labs  06/16/16 2039 06/17/16 0431 06/17/16 0735  GLUCAP 217* 131* 126*   Hypomagnesemia - Repleted.     DVT Prophylaxis  SCDs  Code Status: Full  Family Communication: bedside  Disposition Plan: SNF, consulted social worker  Consultants Cardiology PCCM Oncology, [name]  Procedures  Thoracentesis 2/14  Antibiotics   Anti-infectives    Start     Dose/Rate Route Frequency Ordered Stop   06/12/16 0615  cefTRIAXone (ROCEPHIN) 1 g in dextrose 5 % 50 mL IVPB  Status:  Discontinued     1 g 100 mL/hr over 30 Minutes Intravenous Daily 06/12/16 0604 06/14/16 0859   06/10/16 1345  cefTRIAXone (ROCEPHIN) 1 g in dextrose 5 % 50 mL IVPB     1 g 100 mL/hr over 30 Minutes Intravenous  Once 06/10/16 1338 06/10/16 1506      Subjective:   [name] seen and examined today. Pt reports having more SOB today.     Objective:   Vitals:   06/16/16 1725 06/16/16 1915 06/16/16 2012 06/17/16 0434  BP: (!) 111/57 (!) 113/54 (!) 102/57 136/60  Pulse: 93 88 92 81  Resp: 20 18 18 20   Temp: 98.4 F (36.9 C) 98.5 F (36.9 C) 98.3 F (36.8 C) 97.6 F (36.4 C)  TempSrc: Oral Oral Oral Oral  SpO2: 99% 97% 99% 97%  Weight:      Height:        Intake/Output Summary  (Last 24 hours) at 06/17/16 0752 Last data filed at 06/17/16 0435  Gross per 24 hour  Intake             1095 ml  Output             1150 ml  Net              -55 ml   Filed Weights   06/10/16 1048 06/10/16 1110  Weight: 95.7 kg (211 lb) 95.7 kg (211 lb 1 oz)    Exam  General: Well developed, well nourished, no distress  HEENT: NCAT, mucous membranes moist.   Cardiovascular: S1 S2 normal with systolic murmur  Respiratory: shallow BS bilateral.  Abdomen: Soft, nontender, nondistended, + bowel sounds  Extremities: warm dry without cyanosis clubbing. +2LE  edema B/L  Neuro: AAOx3, nonfocal  Psych: Appropriate   Data Reviewed: I have personally reviewed following labs and imaging studies  CBC:  Recent Labs Lab 06/10/16 1107  06/13/16 0344 06/14/16 0355 06/15/16 0342 06/16/16 0353 06/17/16 0539  WBC 16.2*  < > 11.6* 9.8 8.8 7.8 8.3  NEUTROABS 15.2*  --   --   --  7.4  --   --   HGB 10.4*  < > 9.0* 8.9* 8.6* 8.5* 8.2*  HCT 32.6*  < > 28.7* 28.4* 27.7* 27.6* 26.5*  MCV 91.3  < > 92.0 92.2 92.6 92.6 92.3  PLT <5*  < > 5* 13* 10* <5* <5*  < > = values in this interval not displayed. Basic Metabolic Panel:  Recent Labs Lab 06/12/16 0024 06/12/16 0422 06/13/16 0344 06/14/16 0355 06/15/16 0342 06/16/16 0353 06/17/16 0539  NA 137 135 134* 130* 133* 136 138  K 3.5 3.7 3.4* 5.0 3.5 3.6 3.3*  CL 92* 90* 86* 82* 83* 82* 85*  CO2 39* 35* 41* 42* 43* 45* 45*  GLUCOSE 67 129* 165* 222* 148* 162* 118*  BUN 24* 26* 20 21* 21* 21* 19  CREATININE 0.82 0.92 0.77 0.83 0.78 0.87 0.74  CALCIUM 8.4* 8.3* 8.2* 8.2* 8.0* 8.3* 8.5*  MG 1.6* 1.5* 1.6*  --  1.6* 1.8  --    GFR: Estimated Creatinine Clearance: 95.7 mL/min (by C-G formula based on SCr of 0.74 mg/dL). Liver Function Tests:  Recent Labs Lab 06/10/16 1107 06/11/16 1044  AST 16 14*  ALT 15* 13*  ALKPHOS 62 56  BILITOT 0.5 0.5  PROT 6.9 6.4*  ALBUMIN 2.7* 2.4*   No results for input(s): LIPASE, AMYLASE in  the last 168 hours. No results for input(s): AMMONIA in the last 168 hours. Coagulation Profile:  Recent Labs Lab 06/10/16 1107  INR 1.13   Cardiac Enzymes:  Recent Labs Lab 06/10/16 1107  TROPONINI <0.03   BNP (last 3 results)  Recent Labs  02/17/16 0948 05/05/16 1450  PROBNP 40.0 17.0   HbA1C: No results for input(s): HGBA1C in the last 72 hours. CBG:  Recent Labs Lab 06/16/16 1156 06/16/16 1736 06/16/16 2039 06/17/16 0431 06/17/16 0735  GLUCAP 209* 157* 217* 131* 126*   Lipid Profile: No results for input(s): CHOL, HDL, LDLCALC, TRIG, CHOLHDL, LDLDIRECT in the last 72 hours. Thyroid Function Tests: No results for input(s):  TSH, T4TOTAL, FREET4, T3FREE, THYROIDAB in the last 72 hours. Anemia Panel: No results for input(s): VITAMINB12, FOLATE, FERRITIN, TIBC, IRON, RETICCTPCT in the last 72 hours. Urine analysis:    Component Value Date/Time   COLORURINE YELLOW 06/10/2016 1155   APPEARANCEUR CLEAR 06/10/2016 1155   LABSPEC 1.010 06/10/2016 1155   PHURINE 5.0 06/10/2016 1155   GLUCOSEU NEGATIVE 06/10/2016 1155   HGBUR MODERATE (A) 06/10/2016 1155   BILIRUBINUR NEGATIVE 06/10/2016 1155   KETONESUR NEGATIVE 06/10/2016 1155   PROTEINUR NEGATIVE 06/10/2016 1155   UROBILINOGEN 1.0 11/22/2008 1922   NITRITE NEGATIVE 06/10/2016 1155   LEUKOCYTESUR LARGE (A) 06/10/2016 1155    Recent Results (from the past 240 hour(s))  Culture, blood (Routine x 2)     Status: None   Collection Time: 06/10/16 11:12 AM  Result Value Ref Range Status   Specimen Description BLOOD LEFT ANTECUBITAL  Final   Special Requests BOTTLES DRAWN AEROBIC AND ANAEROBIC 10CC  Final   Culture   Final    NO GROWTH 5 DAYS Performed at [facility], [place] [address], [place], [place] [address]    Report Status 06/15/2016 FINAL  Final  Culture, blood (Routine x 2)     Status: None   Collection Time: 06/10/16 11:14 AM  Result Value Ref Range Status   Specimen Description BLOOD RIGHT  ANTECUBITAL  Final   Special Requests BOTTLES DRAWN AEROBIC AND ANAEROBIC 5CC  Final   Culture   Final    NO GROWTH 5 DAYS Performed at [facility], [place] [address] [place], [place] [address]    Report Status 06/15/2016 FINAL  Final  Urine culture     Status: Abnormal   Collection Time: 06/10/16 11:55 AM  Result Value Ref Range Status   Specimen Description URINE, RANDOM  Final   Special Requests NONE  Final   Culture MULTIPLE SPECIES PRESENT, SUGGEST RECOLLECTION (A)  Final   Report Status 06/13/2016 FINAL  Final  MRSA PCR Screening     Status: None   Collection Time: 06/10/16  4:49 PM  Result Value Ref Range Status   MRSA by PCR NEGATIVE NEGATIVE Final    Comment:        The GeneXpert MRSA Assay (FDA approved for NASAL specimens only), is one component of a comprehensive MRSA colonization surveillance program. It is not intended to diagnose MRSA infection nor to guide or monitor treatment for MRSA infections.   Body fluid culture     Status: None (Preliminary result)   Collection Time: 06/14/16  4:54 PM  Result Value Ref Range Status   Specimen Description Pleural, L  Final   Special Requests NONE  Final   Gram Stain   Final    MODERATE WBC PRESENT,BOTH PMN AND MONONUCLEAR NO ORGANISMS SEEN    Culture   Final    NO GROWTH 2 DAYS Performed at [facility], [place] [address] [place], [place] [address]    Report Status PENDING  Incomplete   Radiology Studies: Dg Chest Port 1 View  Result Date: 06/15/2016 CLINICAL DATA:  Follow-up left pneumothorax and pleural effusion EXAM: PORTABLE CHEST 1 VIEW COMPARISON:  Chest radiograph from one day prior. FINDINGS: Stable cardiomediastinal silhouette with normal heart size. No appreciable residual pneumothorax. Stable small bilateral pleural effusions. No pulmonary edema. Low lung volumes. Stable bibasilar lung opacities. IMPRESSION: 1. No appreciable residual pneumothorax . 2. Stable low lung volumes, small bilateral  pleural effusions and bibasilar lung opacities likely representing atelectasis.  Electronically Signed   By: Ilona Sorrel M.D.   On: 06/15/2016 08:06   Scheduled Meds: . sodium chloride   Intravenous Once  . aminocaproic acid  1,000 mg Oral Q8H  . amiodarone  200 mg Oral Daily  . budesonide (PULMICORT) nebulizer solution  0.25 mg Nebulization BID  . famotidine  20 mg Oral QHS  . furosemide  40 mg Oral Daily  . insulin aspart  0-20 Units Subcutaneous TID WC  . insulin aspart  0-5 Units Subcutaneous QHS  . insulin aspart  8 Units Subcutaneous TID WC  . insulin glargine  22 Units Subcutaneous QHS  . mouth rinse  15 mL Mouth Rinse BID  . pantoprazole  40 mg Oral Daily  . potassium chloride  40 mEq Oral Once  . predniSONE  20 mg Oral Q breakfast   Continuous Infusions: . diltiazem (CARDIZEM) infusion Stopped (06/13/16 1719)     LOS: 7 days   Time Spent in minutes  28 minutes  Tyliyah Mcmeekin MD on 06/17/2016 at 7:52 AM  Between 7am to 7pm - Pager - [phone]  After 7pm go to [email or web address] - password [email or web address]  And look for the night coverage person covering for me after hours  [facility] Office  [phone]

## 2016-06-17 NOTE — NC FL2 (Signed)
[place] LEVEL OF CARE SCREENING TOOL     IDENTIFICATION  Patient Name: [name] Birthdate: 25-Sep-1940 Sex: male Admission Date (Current Location): 06/10/2016  [facility] and [place] Number:  Herbalist and Address:  [facility],  [place] [address] [place]      Provider Number: [id number]  Attending Physician Name and Address:  Murlean Iba, MD  Relative Name and Phone Number:       Current Level of Care: Hospital Recommended Level of Care: [place] Prior Approval Number:    Date Approved/Denied:   PASRR Number: [id number]  Discharge Plan: SNF    Current Diagnoses: Patient Active Problem List   Diagnosis Date Noted  . Bronchiectasis with acute exacerbation ([place])   . 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 Paroxysmal SVT (supraventricular tachycardia) ([place])   . Pleural effusion 06/10/2016  . Obstructive bronchiectasis ([place]) 05/23/2016  . Acute ITP ([place]) 05/15/2016  . Upper airway cough syndrome 02/17/2016  . SOB (shortness of breath) 02/17/2016  . Cough while on acei and with active sinusitis 02/17/2012  . COPD  GOLD III  02/17/2012  . HBP (high blood pressure) 02/17/2012    Orientation RESPIRATION BLADDER Height & Weight     Self, Situation, Place, Time  O2 (2L/min) Continent Weight: 211 lb 1 oz (95.7 kg) Height:  5\' 11"  (180.3 cm)  BEHAVIORAL SYMPTOMS/MOOD NEUROLOGICAL BOWEL NUTRITION STATUS      Continent Diet (Heart Healthy)  AMBULATORY STATUS COMMUNICATION OF NEEDS Skin   Limited Assist Verbally Other (Comment) (Ecchymosis; petechiae bilateral buttocks)                       Personal Care Assistance Level of Assistance  Dressing, Bathing Bathing Assistance: Limited assistance   Dressing Assistance: Limited assistance     Functional Limitations Info             SPECIAL CARE FACTORS FREQUENCY  PT (By licensed PT)     PT Frequency: 5              Contractures  Contractures Info: Not present    Additional Factors Info  Code Status, Allergies Code Status Info: Full Allergies Info: NKA           Current Medications (06/17/2016):  This is the current hospital active medication list Current Facility-Administered Medications  Medication Dose Route Frequency Provider Last Rate Last Dose  . 0.9 %  sodium chloride infusion   Intravenous Once [employer], DO      . adenosine (ADENOCARD) 6 MG/2ML injection 6 mg  6 mg Intravenous Once PRN Alexis Hugelmeyer, DO      . aminocaproic acid (AMICAR) tablet 1,000 mg  1,000 mg Oral Q8H Clanford L Johnson, MD   1,000 mg at 06/17/16 2116  . amiodarone (PACERONE) tablet 200 mg  200 mg Oral Daily Belva Crome, MD   200 mg at 06/17/16 1034  . budesonide (PULMICORT) nebulizer solution 0.25 mg  0.25 mg Nebulization BID Doreatha Lew, MD   0.25 mg at 06/15/16 0839  . diltiazem (CARDIZEM) 100 mg in dextrose 5% 16mL (1 mg/mL) infusion  5-15 mg/hr Intravenous Titrated Rhonda G Barrett, PA-C   Stopped at 06/13/16 1719  . famotidine (PEPCID) tablet 20 mg  20 mg Oral QHS Rhetta Mura Schorr, NP   20 mg at 06/17/16 2116  . furosemide (LASIX) tablet 40 mg  40 mg Oral Daily Clanford Marisa Hua, MD  40 mg at 06/17/16 1034  . insulin aspart (novoLOG) injection 0-20 Units  0-20 Units Subcutaneous TID WC Clanford Marisa Hua, MD   15 Units at 06/17/16 1702  . insulin aspart (novoLOG) injection 0-5 Units  0-5 Units Subcutaneous QHS Clanford Marisa Hua, MD   3 Units at 06/17/16 2218  . insulin aspart (novoLOG) injection 8 Units  8 Units Subcutaneous TID WC Clanford Marisa Hua, MD   8 Units at 06/17/16 1702  . insulin glargine (LANTUS) injection 22 Units  22 Units Subcutaneous QHS Clanford Marisa Hua, MD   22 Units at 06/16/16 2131  . levalbuterol (XOPENEX) nebulizer solution 0.63 mg  0.63 mg Nebulization Q6H PRN Javier Glazier, MD      . MEDLINE mouth rinse  15 mL Mouth Rinse BID Maryann Mikhail, DO   15 mL at 06/17/16 1035  .  pantoprazole (PROTONIX) EC tablet 40 mg  40 mg Oral Daily Maryann Mikhail, DO   40 mg at 06/17/16 1037  . predniSONE (DELTASONE) tablet 20 mg  20 mg Oral Q breakfast Nicholas Lose, MD   20 mg at 06/17/16 [id number]     Discharge Medications: Please see discharge summary for a list of discharge medications.  Relevant Imaging Results:  Relevant Lab Results:   Additional Information SSN  [id number]     Uses inhaler at home.    Williemae Area, LCSW

## 2016-06-18 LAB — BODY FLUID CULTURE: Culture: NO GROWTH

## 2016-06-18 LAB — GLUCOSE, CAPILLARY
GLUCOSE-CAPILLARY: 210 mg/dL — AB (ref 65–99)
GLUCOSE-CAPILLARY: 241 mg/dL — AB (ref 65–99)
Glucose-Capillary: 146 mg/dL — ABNORMAL HIGH (ref 65–99)
Glucose-Capillary: 194 mg/dL — ABNORMAL HIGH (ref 65–99)
Glucose-Capillary: 97 mg/dL (ref 65–99)

## 2016-06-18 MED ORDER — FUROSEMIDE 40 MG PO TABS
40.0000 mg | ORAL_TABLET | ORAL | Status: DC
  Administered 2016-06-19: 40 mg via ORAL
  Filled 2016-06-18: qty 1

## 2016-06-18 MED ORDER — HYDROCOD POLST-CPM POLST ER 10-8 MG/5ML PO SUER
5.0000 mL | Freq: Two times a day (BID) | ORAL | Status: DC | PRN
  Administered 2016-06-18 – 2016-06-19 (×2): 5 mL via ORAL
  Filled 2016-06-18 (×2): qty 5

## 2016-06-18 MED ORDER — GUAIFENESIN-DM 100-10 MG/5ML PO SYRP
5.0000 mL | ORAL_SOLUTION | ORAL | Status: DC | PRN
  Administered 2016-06-18 (×2): 5 mL via ORAL
  Filled 2016-06-18 (×2): qty 10

## 2016-06-18 NOTE — Progress Notes (Signed)
Pt refusing 22 Units [place].  States he has been refusing it every night.  CBG tonight is 97.  Per policy I have notified on call Dr. Maudie Mercury.

## 2016-06-18 NOTE — Progress Notes (Signed)
PROGRESS NOTE    [name]  [id number] DOB: [birth year] DOA: 06/10/2016 PCP: [name], MD   Chief Complaint  Patient presents with  . chemo patient  . Shortness of Breath    Brief Narrative:  HPI on 06/10/2016 by Dr. Chipper Oman [name] is a 76 y.o. male with medical history significant of recently diagnosed with ITP, bronchiectasis, hypertension, diabetes mellitus type 2. Presented to the ED complaining of shortness of breath for the past week has been worsening with no improvement after inhaler therapies. Patient was seen by his hematologist yesterday ordered a CT scan which showed increasing pleural effusion from previous studies. Since seen by pulmonologist, who has him on prednisone taper and multiple inhalers. Hematology giving him IVIG infusions with poor response.  Interim history  Admitted for pleural effusion. Attempting to diurese patient. Patient developed SVT, cardiology consulted.  Hem/Onc consulted for platelets <5. Given 2u on 2/12.  S/p thoracentesis 2/14.  Additional platelet transfusion 2/16.    Assessment & Plan   Pleural effusion -Initial CXR showed pleural effusions -CT chest showed large bilateral pleural effusions  Pt had thoracentesis 2/14 with improvement.  -Admitting Physician discuss with Dr. Ashok Cordia with PCCM, patient not a candidate for thoracentesis until platelet count improves. They recommended aggressive diuresis. If patient became unstable, transfuse platelets and placed chest tube. -Placed on Lasix, will continue for now -Monitor intake and output, down 4.5L since admission -Pt reports breathing better but stamina has been very poor with any movement or ambulation  Dyspnea - Pt reports that dyspnea is not better but has been refusing nebs ordered, I asked him to reconsider doing the nebs as they might help improve his symptoms and help him have less SOB with movement, if no better by tomorrow, will ask pulmonary to see him  again. Encourage incentive spirometry as well.    Cough - explained to patient that likely is precipitated by wheezing but he is refusing nebs, likely would improve if he takes the neb treatments, ordered tussionex PRN  PSVT -Patient initially has had heart rates in the 180s. -Echocardiogram EF 0000000, grade 1 diastolic dysfunction -Magnesium being repleted -Potassium being repleted -Cardiology consulted and appreciated -Continue amiodarone per cardiology service. -HR controlled, Pt refused to take amiodarone 2/16 -cardiology says it can be stopped at discharge  Urinary tract infection -As above -Fully Treated with ceftriaxone IV  Bronchiectasis -Patient has been on high-dose prednisone in the past -Continue prednisone 20 mg daily per hem/onc -Continue nebs, Pulmicort, supplemental oxygen (Pt has been refusing nebs) -Pulmonology consulted but not following, will ask them to see him again 2/19 to help with disposition planning   ITP -Patient follows with Dr. [name], oncology. -Has been receiving IVIG infusions/ rituximab -Continue to monitor CBC -Platelets less than 5 but no bleeding  Diabetes mellitus, type 2 complicated by hypoglycemia -Metformin held, continue insulin sliding scale and CBG monitoring -blood sugars better controlled, follow closely CBG (last 3)   Recent Labs  06/17/16 2206 06/18/16 0506 06/18/16 0743  GLUCAP 277* 146* 194*   Hypomagnesemia - Repleted.     DVT Prophylaxis  SCDs, encourage ambulation  Code Status: Full  Family Communication: wife updated telephone  Disposition Plan: SNF, consulted social worker  Consultants Cardiology PCCM Oncology, Dr. [name]  Procedures  Thoracentesis 2/14  Antibiotics   Anti-infectives    Start     Dose/Rate Route Frequency Ordered Stop   06/12/16 0615  cefTRIAXone (ROCEPHIN) 1 g in dextrose 5 % 50 mL IVPB  Status:  Discontinued     1 g 100 mL/hr over 30 Minutes Intravenous Daily 06/12/16 0604  06/14/16 0859   06/10/16 1345  cefTRIAXone (ROCEPHIN) 1 g in dextrose 5 % 50 mL IVPB     1 g 100 mL/hr over 30 Minutes Intravenous  Once 06/10/16 1338 06/10/16 1506      Subjective:   [name] seen and examined today. Pt reports having more SOB today.     Objective:   Vitals:   06/17/16 1328 06/17/16 2027 06/17/16 2207 06/18/16 0508  BP: 107/60 126/63  125/65  Pulse: 91 91  86  Resp: 19 18  20   Temp: 97.7 F (36.5 C)  98.4 F (36.9 C) 97.5 F (36.4 C)  TempSrc: Oral Oral  Oral  SpO2: 99% 99%  98%  Weight:      Height:        Intake/Output Summary (Last 24 hours) at 06/18/16 0837 Last data filed at 06/18/16 0510  Gross per 24 hour  Intake              240 ml  Output             1001 ml  Net             -761 ml   Filed Weights   06/10/16 1048 06/10/16 1110  Weight: 95.7 kg (211 lb) 95.7 kg (211 lb 1 oz)    Exam  General: Well developed, well nourished, no distress  HEENT: NCAT, mucous membranes moist.   Cardiovascular: S1 S2 normal with systolic murmur  Respiratory: shallow BS bilateral.  Abdomen: Soft, nontender, nondistended, + bowel sounds  Extremities: warm dry without cyanosis clubbing. +2LE  edema B/L  Neuro: AAOx3, nonfocal  Psych: Appropriate   Data Reviewed: I have personally reviewed following labs and imaging studies  CBC:  Recent Labs Lab 06/13/16 0344 06/14/16 0355 06/15/16 0342 06/16/16 0353 06/17/16 0539  WBC 11.6* 9.8 8.8 7.8 8.3  NEUTROABS  --   --  7.4  --   --   HGB 9.0* 8.9* 8.6* 8.5* 8.2*  HCT 28.7* 28.4* 27.7* 27.6* 26.5*  MCV 92.0 92.2 92.6 92.6 92.3  PLT 5* 13* 10* <5* <5*   Basic Metabolic Panel:  Recent Labs Lab 06/12/16 0024 06/12/16 0422 06/13/16 0344 06/14/16 0355 06/15/16 0342 06/16/16 0353 06/17/16 0539  NA 137 135 134* 130* 133* 136 138  K 3.5 3.7 3.4* 5.0 3.5 3.6 3.3*  CL 92* 90* 86* 82* 83* 82* 85*  CO2 39* 35* 41* 42* 43* 45* 45*  GLUCOSE 67 129* 165* 222* 148* 162* 118*  BUN 24* 26* 20  21* 21* 21* 19  CREATININE 0.82 0.92 0.77 0.83 0.78 0.87 0.74  CALCIUM 8.4* 8.3* 8.2* 8.2* 8.0* 8.3* 8.5*  MG 1.6* 1.5* 1.6*  --  1.6* 1.8  --    GFR: Estimated Creatinine Clearance: 95.7 mL/min (by C-G formula based on SCr of 0.74 mg/dL). Liver Function Tests:  Recent Labs Lab 06/11/16 1044  AST 14*  ALT 13*  ALKPHOS 56  BILITOT 0.5  PROT 6.4*  ALBUMIN 2.4*   No results for input(s): LIPASE, AMYLASE in the last 168 hours. No results for input(s): AMMONIA in the last 168 hours. Coagulation Profile: No results for input(s): INR, PROTIME in the last 168 hours. Cardiac Enzymes: No results for input(s): CKTOTAL, CKMB, CKMBINDEX, TROPONINI in the last 168 hours. BNP (last 3 results)  Recent Labs  02/17/16 0948 05/05/16 1450  PROBNP 40.0 17.0  HbA1C: No results for input(s): HGBA1C in the last 72 hours. CBG:  Recent Labs Lab 06/17/16 1132 06/17/16 1656 06/17/16 2206 06/18/16 0506 06/18/16 0743  GLUCAP 113* 311* 277* 146* 194*   Lipid Profile: No results for input(s): CHOL, HDL, LDLCALC, TRIG, CHOLHDL, LDLDIRECT in the last 72 hours. Thyroid Function Tests: No results for input(s): TSH, T4TOTAL, FREET4, T3FREE, THYROIDAB in the last 72 hours. Anemia Panel: No results for input(s): VITAMINB12, FOLATE, FERRITIN, TIBC, IRON, RETICCTPCT in the last 72 hours. Urine analysis:    Component Value Date/Time   COLORURINE YELLOW 06/10/2016 1155   APPEARANCEUR CLEAR 06/10/2016 1155   LABSPEC 1.010 06/10/2016 1155   PHURINE 5.0 06/10/2016 1155   GLUCOSEU NEGATIVE 06/10/2016 1155   HGBUR MODERATE (A) 06/10/2016 1155   BILIRUBINUR NEGATIVE 06/10/2016 1155   KETONESUR NEGATIVE 06/10/2016 1155   PROTEINUR NEGATIVE 06/10/2016 1155   UROBILINOGEN 1.0 11/22/2008 1922   NITRITE NEGATIVE 06/10/2016 1155   LEUKOCYTESUR LARGE (A) 06/10/2016 1155    Recent Results (from the past 240 hour(s))  Culture, blood (Routine x 2)     Status: None   Collection Time: 06/10/16 11:12 AM    Result Value Ref Range Status   Specimen Description BLOOD LEFT ANTECUBITAL  Final   Special Requests BOTTLES DRAWN AEROBIC AND ANAEROBIC 10CC  Final   Culture   Final    NO GROWTH 5 DAYS Performed at [facility], [place] [address] [place], [place] [address]    Report Status 06/15/2016 FINAL  Final  Culture, blood (Routine x 2)     Status: None   Collection Time: 06/10/16 11:14 AM  Result Value Ref Range Status   Specimen Description BLOOD RIGHT ANTECUBITAL  Final   Special Requests BOTTLES DRAWN AEROBIC AND ANAEROBIC 5CC  Final   Culture   Final    NO GROWTH 5 DAYS Performed at [facility], [place] [address], [place], [place] [address]    Report Status 06/15/2016 FINAL  Final  Urine culture     Status: Abnormal   Collection Time: 06/10/16 11:55 AM  Result Value Ref Range Status   Specimen Description URINE, RANDOM  Final   Special Requests NONE  Final   Culture MULTIPLE SPECIES PRESENT, SUGGEST RECOLLECTION (A)  Final   Report Status 06/13/2016 FINAL  Final  MRSA PCR Screening     Status: None   Collection Time: 06/10/16  4:49 PM  Result Value Ref Range Status   MRSA by PCR NEGATIVE NEGATIVE Final    Comment:        The GeneXpert MRSA Assay (FDA approved for NASAL specimens only), is one component of a comprehensive MRSA colonization surveillance program. It is not intended to diagnose MRSA infection nor to guide or monitor treatment for MRSA infections.   Body fluid culture     Status: None (Preliminary result)   Collection Time: 06/14/16  4:54 PM  Result Value Ref Range Status   Specimen Description Pleural, L  Final   Special Requests NONE  Final   Gram Stain   Final    MODERATE WBC PRESENT,BOTH PMN AND MONONUCLEAR NO ORGANISMS SEEN    Culture   Final    NO GROWTH 3 DAYS Performed at [facility], [place] [address] [place],  [address]    Report Status PENDING  Incomplete   Radiology Studies: Dg Chest 2 View  Result Date:  06/17/2016 CLINICAL DATA:  Shortness of breath, history type II diabetes mellitus and ITP  EXAM: CHEST  2 VIEW COMPARISON:  06/15/2016 FINDINGS: Normal heart size, mediastinal contours, and pulmonary vascularity. Atherosclerotic calcification aorta. Central peribronchial thickening. Bibasilar pleural effusions and atelectasis. Upper lungs clear. No pneumothorax. Bones demineralized. IMPRESSION: Bronchitic changes with persistent bibasilar pleural effusions and atelectasis. Aortic atherosclerosis. Electronically Signed   By: Lavonia Dana M.D.   On: 06/17/2016 09:07   Scheduled Meds: . sodium chloride   Intravenous Once  . aminocaproic acid  1,000 mg Oral Q8H  . amiodarone  200 mg Oral Daily  . budesonide (PULMICORT) nebulizer solution  0.25 mg Nebulization BID  . famotidine  20 mg Oral QHS  . furosemide  40 mg Oral Daily  . insulin aspart  0-20 Units Subcutaneous TID WC  . insulin aspart  0-5 Units Subcutaneous QHS  . insulin aspart  8 Units Subcutaneous TID WC  . insulin glargine  22 Units Subcutaneous QHS  . mouth rinse  15 mL Mouth Rinse BID  . pantoprazole  40 mg Oral Daily  . predniSONE  20 mg Oral Q breakfast   Continuous Infusions: . diltiazem (CARDIZEM) infusion Stopped (06/13/16 1719)     LOS: 8 days   Time Spent in minutes  23 minutes  Cristalle Rohm MD on 06/18/2016 at 8:37 AM  Between 7am to 7pm - Pager - [phone]  After 7pm go to [email or web address] - password [email or web address]  And look for the night coverage person covering for me after hours  [facility] Office  [phone]

## 2016-06-19 DIAGNOSIS — D693 Immune thrombocytopenic purpura: Secondary | ICD-10-CM

## 2016-06-19 LAB — BASIC METABOLIC PANEL
Anion gap: 5 (ref 5–15)
BUN: 20 mg/dL (ref 6–20)
CHLORIDE: 85 mmol/L — AB (ref 101–111)
CO2: 45 mmol/L — ABNORMAL HIGH (ref 22–32)
CREATININE: 0.8 mg/dL (ref 0.61–1.24)
Calcium: 8.9 mg/dL (ref 8.9–10.3)
Glucose, Bld: 184 mg/dL — ABNORMAL HIGH (ref 65–99)
Potassium: 4.3 mmol/L (ref 3.5–5.1)
SODIUM: 135 mmol/L (ref 135–145)

## 2016-06-19 LAB — CBC
HCT: 29.8 % — ABNORMAL LOW (ref 39.0–52.0)
HEMOGLOBIN: 8.8 g/dL — AB (ref 13.0–17.0)
MCH: 27.3 pg (ref 26.0–34.0)
MCHC: 29.5 g/dL — AB (ref 30.0–36.0)
MCV: 92.5 fL (ref 78.0–100.0)
RBC: 3.22 MIL/uL — ABNORMAL LOW (ref 4.22–5.81)
RDW: 14.3 % (ref 11.5–15.5)
WBC: 7.2 10*3/uL (ref 4.0–10.5)

## 2016-06-19 LAB — GLUCOSE, CAPILLARY
GLUCOSE-CAPILLARY: 184 mg/dL — AB (ref 65–99)
GLUCOSE-CAPILLARY: 172 mg/dL — AB (ref 65–99)
GLUCOSE-CAPILLARY: 218 mg/dL — AB (ref 65–99)
GLUCOSE-CAPILLARY: 393 mg/dL — AB (ref 65–99)
Glucose-Capillary: 89 mg/dL (ref 65–99)

## 2016-06-19 NOTE — Progress Notes (Signed)
SATURATION QUALIFICATIONS: (This note is used to comply with regulatory documentation for home oxygen)  Patient Saturations on Room Air at Rest = 99%  Patient Saturations on Room Air while Ambulating = 83%  Patient Saturations on 3 Liters of oxygen while Ambulating = 99%  Please briefly explain why patient needs home oxygen: to maintain saturations above 88% during physical activity.  [name], PT, DPT 06/19/2016 Pager: [phone]

## 2016-06-19 NOTE — Progress Notes (Signed)
SATURATION QUALIFICATIONS: (This note is used to comply with regulatory documentation for home oxygen)  Patient Saturations on Room Air at Rest = 99%  Patient Saturations on Room Air while Ambulating = 83%  Patient Saturations on 3 Liters of oxygen while Ambulating = 99%  Please briefly explain why patient needs home oxygen: to maintain saturations above 88% during physical activity.

## 2016-06-19 NOTE — Progress Notes (Signed)
PROGRESS NOTE    [name]  [id number] DOB: April 09, 1941 DOA: 06/10/2016 PCP: [name], MD   Chief Complaint  Patient presents with  . chemo patient  . Shortness of Breath    Brief Narrative:  HPI on 06/10/2016 by Dr. Chipper Oman [name] is a 76 y.o. male with medical history significant of recently diagnosed with ITP, bronchiectasis, hypertension, diabetes mellitus type 2. Presented to the ED complaining of shortness of breath for the past week has been worsening with no improvement after inhaler therapies. Patient was seen by his hematologist yesterday ordered a CT scan which showed increasing pleural effusion from previous studies. Since seen by pulmonologist, who has him on prednisone taper and multiple inhalers. Hematology giving him IVIG infusions with poor response.  Interim history  Admitted for pleural effusion. Attempting to diurese patient. Patient developed SVT, cardiology consulted.  Hem/Onc consulted for platelets <5. Given 2u on 2/12.  S/p thoracentesis 2/14.  Additional platelet transfusion 2/16.    Assessment & Plan   Pleural effusion -Initial CXR showed pleural effusions -CT chest showed large bilateral pleural effusions  Pt had thoracentesis 2/14 with improvement.  -Admitting Physician discuss with Dr. Ashok Cordia with PCCM, patient not a candidate for thoracentesis until platelet count improves. They recommended aggressive diuresis. If patient became unstable, transfuse platelets and placed chest tube. -Placed on Lasix, will continue for now -Monitor intake and output, down 4.5L since admission -Pt reports breathing better but stamina has been very poor with any movement or ambulation  Dyspnea - Pt reports that dyspnea is not better but has been refusing nebs ordered, I asked him to reconsider doing the nebs as they might help improve his symptoms and help him have less SOB with movement, if no better by tomorrow, will ask pulmonary to see him  again. Encourage incentive spirometry as well.    Cough - explained to patient that likely is precipitated by wheezing but he is refusing nebs, likely would improve if he takes the neb treatments, ordered tussionex PRN  PSVT -Patient initially has had heart rates in the 180s. -Echocardiogram EF 0000000, grade 1 diastolic dysfunction -Magnesium being repleted -Potassium being repleted -Cardiology consulted and appreciated -Continue amiodarone per cardiology service. -HR controlled, Pt refused to take amiodarone 2/16 -cardiology says it can be stopped at discharge  Urinary tract infection -As above -Fully Treated with ceftriaxone IV  Bronchiectasis -Patient has been on high-dose prednisone in the past -Continue prednisone 20 mg daily per hem/onc -Continue nebs, Pulmicort, supplemental oxygen (Pt has been refusing nebs) -Pulmonology consulted but not following   ITP -Patient follows with Dr. [name], oncology. -Has been receiving IVIG infusions/ rituximab -Continue to monitor CBC -Platelets less than 5 but no bleeding -I spoke with Dr. [name] today and will will see patient later today.   Diabetes mellitus, type 2 complicated by hypoglycemia -Metformin held, continue insulin sliding scale and CBG monitoring -blood sugars better controlled, follow closely CBG (last 3)   Recent Labs  06/18/16 2112 06/19/16 0521 06/19/16 0734  GLUCAP 97 184* 172*   Hypomagnesemia - Repleted.     DVT Prophylaxis  SCDs, encourage ambulation  Code Status: Full  Family Communication: wife updated telephone  Disposition Plan: SNF, consulted social worker  Consultants Cardiology PCCM Oncology, Dr. [name]  Procedures  Thoracentesis 2/14  Antibiotics   Anti-infectives    Start     Dose/Rate Route Frequency Ordered Stop   06/12/16 0615  cefTRIAXone (ROCEPHIN) 1 g in dextrose 5 % 50 mL  IVPB  Status:  Discontinued     1 g 100 mL/hr over 30 Minutes Intravenous Daily 06/12/16 0604  06/14/16 0859   06/10/16 1345  cefTRIAXone (ROCEPHIN) 1 g in dextrose 5 % 50 mL IVPB     1 g 100 mL/hr over 30 Minutes Intravenous  Once 06/10/16 1338 06/10/16 1506      Subjective:   [name] seen and examined today. Pt reports less SOB and more ambulation.     Objective:   Vitals:   06/18/16 0508 06/18/16 1403 06/18/16 2115 06/19/16 0513  BP: 125/65 116/72 121/64 (!) 118/57  Pulse: 86 96 87 86  Resp: 20 18 18 18   Temp: 97.5 F (36.4 C) 98.2 F (36.8 C) 98 F (36.7 C) 98 F (36.7 C)  TempSrc: Oral Oral Oral Oral  SpO2: 98% 100% 100% 100%  Weight:    87.6 kg (193 lb 1.6 oz)  Height:        Intake/Output Summary (Last 24 hours) at 06/19/16 1128 Last data filed at 06/19/16 0900  Gross per 24 hour  Intake             1200 ml  Output             1250 ml  Net              -50 ml   Filed Weights   06/10/16 1048 06/10/16 1110 06/19/16 0513  Weight: 95.7 kg (211 lb) 95.7 kg (211 lb 1 oz) 87.6 kg (193 lb 1.6 oz)    Exam  General: Well developed, well nourished, no distress  HEENT: NCAT, mucous membranes moist.   Cardiovascular: S1 S2 normal with systolic murmur  Respiratory: shallow BS bilateral.  Abdomen: Soft, nontender, nondistended, + bowel sounds  Extremities: warm dry without cyanosis clubbing. 1+ edema bilateral  Neuro: AAOx3, nonfocal  Psych: Appropriate   Data Reviewed: I have personally reviewed following labs and imaging studies  CBC:  Recent Labs Lab 06/14/16 0355 06/15/16 0342 06/16/16 0353 06/17/16 0539 06/19/16 0523  WBC 9.8 8.8 7.8 8.3 7.2  NEUTROABS  --  7.4  --   --   --   HGB 8.9* 8.6* 8.5* 8.2* 8.8*  HCT 28.4* 27.7* 27.6* 26.5* 29.8*  MCV 92.2 92.6 92.6 92.3 92.5  PLT 13* 10* <5* <5* <5*   Basic Metabolic Panel:  Recent Labs Lab 06/13/16 0344 06/14/16 0355 06/15/16 0342 06/16/16 0353 06/17/16 0539 06/19/16 0523  NA 134* 130* 133* 136 138 135  K 3.4* 5.0 3.5 3.6 3.3* 4.3  CL 86* 82* 83* 82* 85* 85*  CO2 41* 42*  43* 45* 45* 45*  GLUCOSE 165* 222* 148* 162* 118* 184*  BUN 20 21* 21* 21* 19 20  CREATININE 0.77 0.83 0.78 0.87 0.74 0.80  CALCIUM 8.2* 8.2* 8.0* 8.3* 8.5* 8.9  MG 1.6*  --  1.6* 1.8  --   --    GFR: Estimated Creatinine Clearance: 86.3 mL/min (by C-G formula based on SCr of 0.8 mg/dL). Liver Function Tests: No results for input(s): AST, ALT, ALKPHOS, BILITOT, PROT, ALBUMIN in the last 168 hours. No results for input(s): LIPASE, AMYLASE in the last 168 hours. No results for input(s): AMMONIA in the last 168 hours. Coagulation Profile: No results for input(s): INR, PROTIME in the last 168 hours. Cardiac Enzymes: No results for input(s): CKTOTAL, CKMB, CKMBINDEX, TROPONINI in the last 168 hours. BNP (last 3 results)  Recent Labs  02/17/16 0948 05/05/16 1450  PROBNP 40.0 17.0  HbA1C: No results for input(s): HGBA1C in the last 72 hours. CBG:  Recent Labs Lab 06/18/16 1223 06/18/16 1701 06/18/16 2112 06/19/16 0521 06/19/16 0734  GLUCAP 210* 241* 97 184* 172*   Lipid Profile: No results for input(s): CHOL, HDL, LDLCALC, TRIG, CHOLHDL, LDLDIRECT in the last 72 hours. Thyroid Function Tests: No results for input(s): TSH, T4TOTAL, FREET4, T3FREE, THYROIDAB in the last 72 hours. Anemia Panel: No results for input(s): VITAMINB12, FOLATE, FERRITIN, TIBC, IRON, RETICCTPCT in the last 72 hours. Urine analysis:    Component Value Date/Time   COLORURINE YELLOW 06/10/2016 1155   APPEARANCEUR CLEAR 06/10/2016 1155   LABSPEC 1.010 06/10/2016 1155   PHURINE 5.0 06/10/2016 1155   GLUCOSEU NEGATIVE 06/10/2016 1155   HGBUR MODERATE (A) 06/10/2016 1155   BILIRUBINUR NEGATIVE 06/10/2016 1155   KETONESUR NEGATIVE 06/10/2016 1155   PROTEINUR NEGATIVE 06/10/2016 1155   UROBILINOGEN 1.0 11/22/2008 1922   NITRITE NEGATIVE 06/10/2016 1155   LEUKOCYTESUR LARGE (A) 06/10/2016 1155    Recent Results (from the past 240 hour(s))  Culture, blood (Routine x 2)     Status: None    Collection Time: 06/10/16 11:12 AM  Result Value Ref Range Status   Specimen Description BLOOD LEFT ANTECUBITAL  Final   Special Requests BOTTLES DRAWN AEROBIC AND ANAEROBIC 10CC  Final   Culture   Final    NO GROWTH 5 DAYS Performed at [facility], [place] [address], [place], [place] [address]    Report Status 06/15/2016 FINAL  Final  Culture, blood (Routine x 2)     Status: None   Collection Time: 06/10/16 11:14 AM  Result Value Ref Range Status   Specimen Description BLOOD RIGHT ANTECUBITAL  Final   Special Requests BOTTLES DRAWN AEROBIC AND ANAEROBIC 5CC  Final   Culture   Final    NO GROWTH 5 DAYS Performed at [facility], [place] [address], [place],  [address]    Report Status 06/15/2016 FINAL  Final  Urine culture     Status: Abnormal   Collection Time: 06/10/16 11:55 AM  Result Value Ref Range Status   Specimen Description URINE, RANDOM  Final   Special Requests NONE  Final   Culture MULTIPLE SPECIES PRESENT, SUGGEST RECOLLECTION (A)  Final   Report Status 06/13/2016 FINAL  Final  MRSA PCR Screening     Status: None   Collection Time: 06/10/16  4:49 PM  Result Value Ref Range Status   MRSA by PCR NEGATIVE NEGATIVE Final    Comment:        The GeneXpert MRSA Assay (FDA approved for NASAL specimens only), is one component of a comprehensive MRSA colonization surveillance program. It is not intended to diagnose MRSA infection nor to guide or monitor treatment for MRSA infections.   Body fluid culture     Status: None   Collection Time: 06/14/16  4:54 PM  Result Value Ref Range Status   Specimen Description Pleural, L  Final   Special Requests NONE  Final   Gram Stain   Final    MODERATE WBC PRESENT,BOTH PMN AND MONONUCLEAR NO ORGANISMS SEEN    Culture   Final    NO GROWTH 3 DAYS Performed at [facility], [place] [address], [place],  [address]    Report Status 06/18/2016 FINAL  Final   Radiology Studies: No results  found. Scheduled Meds: . sodium chloride   Intravenous Once  . aminocaproic acid  1,000 mg Oral Q8H  .  amiodarone  200 mg Oral Daily  . budesonide (PULMICORT) nebulizer solution  0.25 mg Nebulization BID  . famotidine  20 mg Oral QHS  . furosemide  40 mg Oral QODAY  . insulin aspart  0-20 Units Subcutaneous TID WC  . insulin aspart  0-5 Units Subcutaneous QHS  . mouth rinse  15 mL Mouth Rinse BID  . pantoprazole  40 mg Oral Daily  . predniSONE  20 mg Oral Q breakfast   Continuous Infusions:   LOS: 9 days   Time Spent in minutes  22 minutes  Felipe Cabell MD on 06/19/2016 at 11:28 AM  Between 7am to 7pm - Pager - [phone]  After 7pm go to [email or web address] - password [email or web address]  And look for the night coverage person covering for me after hours  [facility] Office  [phone]

## 2016-06-19 NOTE — Care Management Note (Signed)
Case Management Note  Patient Details  Name: Edil Rufus MRN: [id number] Date of Birth: 12-23-1940  Subjective/Objective:CM/CSW went to rm to discuss d/c plans-Now PT-recc HHPT,rw.Await HHPT,[id number],home rw order. Provided patient w/HHC agency list as resource-patient chose [facility] rep [place]-await response if able to accept. Noted on 02, may need home 02-await 02 sats documentation, & home 02 order.                     Action/Plan:d/c home w/HHC/dme   Expected Discharge Date:                  Expected Discharge Plan:  [place]  In-House Referral:     Discharge planning Services  CM Consult  Post Acute Care Choice:    Choice offered to:  Patient  DME Arranged:    DME Agency:     HH Arranged:    [place] Agency:     Status of Service:  In process, will continue to follow  If discussed at Long Length of Stay Meetings, dates discussed:    Additional Comments:  Dessa Phi, RN 06/19/2016, 2:43 PM

## 2016-06-19 NOTE — Clinical Social Work Placement (Signed)
CSW spoke with patient & wife, [name] at bedside re: discharge planning/provided SNF bed offers. Patient has a bed accepted a bed at [facility]. CSW confirmed with Rollene Fare at [facility] that they would be able to take patient, pending [facility] authorization. CSW has completed FL2 & will continue to follow and assist with discharge when ready.      Raynaldo Opitz, [facility] Clinical Social Worker cell #: [phone]    CLINICAL SOCIAL WORK PLACEMENT  NOTE  Date:  06/19/2016  Patient Details  Name: [name] MRN: [id number] Date of Birth: 1940/06/06  Clinical Social Work is seeking post-discharge placement for this patient at the [place] level of care (*CSW will initial, date and re-position this form in  chart as items are completed):  Yes   Patient/family provided with [facility] Work Department's list of facilities offering this level of care within the geographic area requested by the patient (or if unable, by the patient's family).  Yes   Patient/family informed of their freedom to choose among providers that offer the needed level of care, that participate in Medicare, Medicaid or managed care program needed by the patient, have an available bed and are willing to accept the patient.  Yes   Patient/family informed of [facility] ownership interest in [facility] and [facility], as well as of the fact that they are under no obligation to receive care at these facilities.  PASRR submitted to EDS on 06/17/16     PASRR number received on 06/17/16     Existing PASRR number confirmed on       FL2 transmitted to all facilities in geographic area requested by pt/family on 06/17/16     FL2 transmitted to all facilities within larger geographic area on       Patient informed that his/her managed care company has contracts with or will negotiate with certain facilities, including the following:   [employer])     Yes   Patient/family informed of bed offers received.  Patient chooses bed at [facility]     Physician recommends and patient chooses bed at      Patient to be transferred to [facility] on  .  Patient to be transferred to facility by [facility])     Patient family notified on   of transfer.  Name of family member notified:        PHYSICIAN       Additional Comment:    _______________________________________________ Standley Brooking, LCSW 06/19/2016, 1:34 PM

## 2016-06-19 NOTE — Progress Notes (Signed)
Physical Therapy Treatment Patient Details Name: [name] MRN: [id number] DOB: April 26, 1941 Today's Date: 06/19/2016    History of Present Illness [name] is a 76 y.o. male with medical history significant of recently diagnosed with ITP, bronchiectasis, hypertension, diabetes mellitus type 2. Presented to the ED complaining of shortness of breath for the past week     PT Comments    Pt with drop in oxygen saturations during ambulation (see previous progress note) and required supplemental oxygen.  Pt able to tolerate ambulating good distance in hallway once on oxygen however did require some breathing cues.  Pt reports dyspnea being a problem stating his "numbers look good but I feel short of breath."    Follow Up Recommendations  Home health PT;Supervision - Intermittent     Equipment Recommendations  Rolling walker with 5" wheels    Recommendations for Other Services       Precautions / Restrictions Precautions Precautions: Fall    Mobility  Bed Mobility               General bed mobility comments: pt in chair  Transfers Overall transfer level: Needs assistance Equipment used: Rolling walker (2 wheeled) Transfers: Sit to/from Stand Sit to Stand: Min guard;Supervision         General transfer comment: cues for hand placement, safety  Ambulation/Gait Ambulation/Gait assistance: Min guard;Supervision Ambulation Distance (Feet): 400 Feet Assistive device: Rolling walker (2 wheeled) Gait Pattern/deviations: Step-through pattern;Decreased stride length     General Gait Details: verbal cues for pursed lip breathing, Saturations dropped to 83% on room air so applied oxygen, 3L O2 required for SPO2 97-99% (see progress note), pt reports dyspnea improved with oxygen however still becomes SOB at rest and with activity   Stairs            Wheelchair Mobility    Modified Rankin (Stroke Patients Only)       Balance                                     Cognition Arousal/Alertness: Awake/alert Behavior During Therapy: WFL for tasks assessed/performed Overall Cognitive Status: Within Functional Limits for tasks assessed                      Exercises      General Comments        Pertinent Vitals/Pain Pain Assessment: No/denies pain    Home Living                      Prior Function            PT Goals (current goals can now be found in the care plan section) Progress towards PT goals: Progressing toward goals    Frequency           PT Plan Discharge plan needs to be updated    Co-evaluation             End of Session Equipment Utilized During Treatment: Gait belt;Oxygen Activity Tolerance: Patient tolerated treatment well Patient left: in chair;with call bell/phone within reach;with chair alarm set;with family/visitor present Nurse Communication: Mobility status PT Visit Diagnosis: Other abnormalities of gait and mobility (R26.89)     Time: [id number] PT Time Calculation (min) (ACUTE ONLY): 19 min  Charges:  $Gait Training: 8-22 mins  G Codes:       [name],KATHrine E 06/19/2016, 1:43 PM [name], PT, DPT 06/19/2016 Pager: [phone]

## 2016-06-19 NOTE — Progress Notes (Signed)
HEMATOLOGY-ONCOLOGY PROGRESS NOTE  SUBJECTIVE: Follow-up of severe ITP and pleural effusion requiring thoracentesis  OBJECTIVE: REVIEW OF SYSTEMS:   Constitutional: Denies fevers, chills or abnormal weight loss, oxygen by nasal cannula Eyes: Denies blurriness of vision Ears, nose, mouth, throat, and face: Denies mucositis or sore throat Respiratory: Shortness of breath at rest Cardiovascular: Denies palpitation, chest discomfort Gastrointestinal:  Denies nausea, heartburn or change in bowel habits Skin: Denies abnormal skin rashes Lymphatics: Denies new lymphadenopathy or easy bruising Neurological: Generalized weakness in the lower extremities Behavioral/Psych: Mood is stable, no new changes  Extremities: 2+ lower extremity edema All other systems were reviewed with the patient and are negative.  I have reviewed the past medical history, past surgical history, social history and family history with the patient and they are unchanged from previous note.   PHYSICAL EXAMINATION: ECOG PERFORMANCE STATUS: 3 - Symptomatic, >50% confined to bed  Vitals:   06/19/16 0513 06/19/16 1245  BP: (!) 118/57 123/68  Pulse: 86 83  Resp: 18 18  Temp: 98 F (36.7 C) 98.1 F (36.7 C)   Filed Weights   06/10/16 1048 06/10/16 1110 06/19/16 0513  Weight: 211 lb (95.7 kg) 211 lb 1 oz (95.7 kg) 193 lb 1.6 oz (87.6 kg)    GENERAL:alert, no distress and comfortable SKIN: skin color, texture, turgor are normal, no rashes or significant lesions EYES: normal, Conjunctiva are pink and non-injected, sclera clear OROPHARYNX:no exudate, no erythema and lips, buccal mucosa, and tongue normal  NECK: supple, thyroid normal size, non-tender, without nodularity LYMPH:  no palpable lymphadenopathy in the cervical, axillary or inguinal LUNGS: Diminished breath sounds bilaterally HEART: regular rate & rhythm and no murmurs and no lower extremity edema ABDOMEN:abdomen soft, non-tender and normal bowel  sounds Musculoskeletal:no cyanosis of digits and no clubbing  NEURO: Lower extremity weakness  LABORATORY DATA:  I have reviewed the data as listed CMP Latest Ref Rng & Units 06/19/2016 06/17/2016 06/16/2016  Glucose 65 - 99 mg/dL 184(H) 118(H) 162(H)  BUN 6 - 20 mg/dL 20 19 21(H)  Creatinine 0.61 - 1.24 mg/dL 0.80 0.74 0.87  Sodium 135 - 145 mmol/L 135 138 136  Potassium 3.5 - 5.1 mmol/L 4.3 3.3(L) 3.6  Chloride 101 - 111 mmol/L 85(L) 85(L) 82(L)  CO2 22 - 32 mmol/L 45(H) 45(H) 45(H)  Calcium 8.9 - 10.3 mg/dL 8.9 8.5(L) 8.3(L)  Total Protein 6.5 - 8.1 g/dL - - -  Total Bilirubin 0.3 - 1.2 mg/dL - - -  Alkaline Phos 38 - 126 U/L - - -  AST 15 - 41 U/L - - -  ALT 17 - 63 U/L - - -    Lab Results  Component Value Date   WBC 7.2 06/19/2016   HGB 8.8 (L) 06/19/2016   HCT 29.8 (L) 06/19/2016   MCV 92.5 06/19/2016   PLT <5 (LL) 06/19/2016   NEUTROABS 7.4 06/15/2016    ASSESSMENT AND PLAN: 1. Severe refractory ITP: Patient had 3 doses of Rituxan. I recommended that he receive the fourth and final dose of Rituxan. Since this was already delayed by a week, I recommended that he receive this medicine tomorrow in the hospital. So far it does not appear that he had responded to the treatment. We'll have to wait and see what happens after this last dose of Rituxan. I discussed with her extensively about the next steps in the treatment which include Promacta oral therapy. The other option is oral Cytoxan.  2. bilateral pleural effusion status post left thoracentesis:  Awaiting pathology report. This will determine what we will do overall. 3. Disposition: I discussed with the patient that he may need short-term rehabilitation to get stronger. His wife is very concerned about his stability. The patient very reluctant to consider inpatient rehabilitation.  I discussed this with the social worker and provided her with my impression.   Plan: Rituxan 06/20/2016

## 2016-06-20 ENCOUNTER — Ambulatory Visit: Payer: Medicare HMO

## 2016-06-20 ENCOUNTER — Other Ambulatory Visit: Payer: Medicare HMO

## 2016-06-20 LAB — GLUCOSE, CAPILLARY
GLUCOSE-CAPILLARY: 162 mg/dL — AB (ref 65–99)
Glucose-Capillary: 169 mg/dL — ABNORMAL HIGH (ref 65–99)
Glucose-Capillary: 257 mg/dL — ABNORMAL HIGH (ref 65–99)

## 2016-06-20 MED ORDER — SODIUM CHLORIDE 0.9 % IV SOLN: Freq: Once | INTRAVENOUS | Status: DC | PRN

## 2016-06-20 MED ORDER — RITUXIMAB CHEMO INJECTION 500 MG/50ML
375.0000 mg/m2 | Freq: Once | INTRAVENOUS | Status: AC
  Administered 2016-06-20: 800 mg via INTRAVENOUS
  Filled 2016-06-20: qty 50

## 2016-06-20 MED ORDER — ALTEPLASE 2 MG IJ SOLR: 2.0000 mg | Freq: Once | INTRAMUSCULAR | Status: DC | PRN

## 2016-06-20 MED ORDER — SODIUM CHLORIDE 0.9 % IV SOLN: Freq: Once | INTRAVENOUS | Status: DC

## 2016-06-20 MED ORDER — EPINEPHRINE PF 1 MG/ML IJ SOLN: 0.5000 mg | Freq: Once | INTRAMUSCULAR | Status: DC | PRN

## 2016-06-20 MED ORDER — ALBUTEROL SULFATE (2.5 MG/3ML) 0.083% IN NEBU: 2.5000 mg | INHALATION_SOLUTION | Freq: Once | RESPIRATORY_TRACT | Status: DC | PRN

## 2016-06-20 MED ORDER — DIPHENHYDRAMINE HCL 50 MG/ML IJ SOLN: 50.0000 mg | Freq: Once | INTRAMUSCULAR | Status: DC | PRN

## 2016-06-20 MED ORDER — SODIUM CHLORIDE 0.9 % IV SOLN: 375.0000 mg/m2 | Freq: Once | INTRAVENOUS | Status: DC

## 2016-06-20 MED ORDER — HEPARIN SOD (PORK) LOCK FLUSH 100 UNIT/ML IV SOLN: 500.0000 [IU] | Freq: Once | INTRAVENOUS | Status: DC | PRN

## 2016-06-20 MED ORDER — HEPARIN SOD (PORK) LOCK FLUSH 100 UNIT/ML IV SOLN: 250.0000 [IU] | Freq: Once | INTRAVENOUS | Status: DC | PRN

## 2016-06-20 MED ORDER — SODIUM CHLORIDE 0.9% FLUSH: 3.0000 mL | INTRAVENOUS | Status: DC | PRN

## 2016-06-20 MED ORDER — METHYLPREDNISOLONE SODIUM SUCC 125 MG IJ SOLR: 125.0000 mg | Freq: Once | INTRAMUSCULAR | Status: DC | PRN

## 2016-06-20 MED ORDER — EPINEPHRINE PF 1 MG/10ML IJ SOSY: 0.2500 mg | PREFILLED_SYRINGE | Freq: Once | INTRAMUSCULAR | Status: DC | PRN

## 2016-06-20 MED ORDER — DIPHENHYDRAMINE HCL 50 MG PO CAPS
50.0000 mg | ORAL_CAPSULE | Freq: Once | ORAL | Status: AC
  Administered 2016-06-20: 50 mg via ORAL
  Filled 2016-06-20: qty 1

## 2016-06-20 MED ORDER — FAMOTIDINE IN NACL 20-0.9 MG/50ML-% IV SOLN: 20.0000 mg | Freq: Once | INTRAVENOUS | Status: DC | PRN

## 2016-06-20 MED ORDER — DIPHENHYDRAMINE HCL 50 MG/ML IJ SOLN: 25.0000 mg | Freq: Once | INTRAMUSCULAR | Status: DC | PRN

## 2016-06-20 MED ORDER — ACETAMINOPHEN 325 MG PO TABS
650.0000 mg | ORAL_TABLET | Freq: Once | ORAL | Status: AC
  Administered 2016-06-20: 650 mg via ORAL
  Filled 2016-06-20: qty 2

## 2016-06-20 MED ORDER — HYDROCOD POLST-CPM POLST ER 10-8 MG/5ML PO SUER: 5.0000 mL | Freq: Every evening | ORAL | 0 refills | Status: DC | PRN

## 2016-06-20 MED ORDER — DOXYCYCLINE HYCLATE 100 MG PO CAPS: 100.0000 mg | ORAL_CAPSULE | Freq: Two times a day (BID) | ORAL | 0 refills | Status: AC

## 2016-06-20 MED ORDER — SODIUM CHLORIDE 0.9% FLUSH: 10.0000 mL | INTRAVENOUS | Status: DC | PRN

## 2016-06-20 NOTE — Care Management Important Message (Signed)
Important Message  Patient Details  Name: [name] MRN: [id number] Date of Birth: 1941-04-09   Medicare Important Message Given:  Yes    Kerin Salen 06/20/2016, 10:47 AMImportant Message  Patient Details  Name: [name] MRN: [id number] Date of Birth: 11-22-1940   Medicare Important Message Given:  Yes    Kerin Salen 06/20/2016, 10:47 AM

## 2016-06-20 NOTE — Progress Notes (Signed)
HEM-ONC Patient will need IV Rituxan today (last dose) I called and informed our pharmacist who will inform the inpatient pharmacist.

## 2016-06-20 NOTE — Progress Notes (Signed)
Report called to [place]. Will transfer pt to 3W.

## 2016-06-20 NOTE — Discharge Instructions (Signed)
Don't Take Mucinex DM and tussionex cough syrup at the same time.  Return if symptoms worsen or new problems develop.

## 2016-06-20 NOTE — Progress Notes (Signed)
Initiated RItuxan at 100 mg/hr at 1410, to increase by 100 mg/hr increments every 30 mins. Pre-meds of benadryl and Tylenol given. Patient tolerting well.

## 2016-06-20 NOTE — Discharge Summary (Signed)
Physician Discharge Summary  [name] [id number] DOB: [birth year] DOA: 06/10/2016  PCP: [name], MD  Admit date: 06/10/2016 Discharge date: 06/20/2016  Admitted From: Home  Disposition:  Home   Recommendations for Outpatient Follow-up:  1. Follow up with PCP and oncologist in 1-2 weeks 2. Please obtain BMP/CBC in one week  Home Health: YES Equipment/Devices: Home oxygen 3L/min  Discharge Condition: STABLE  CODE STATUS: FULL  Please see hospital notes and records for full details of this admission. Brief/Interim Summary: HPI: [name] is a 76 y.o. male with medical history significant of recently diagnosed with ITP, bronchiectasis, hypertension, diabetes mellitus type 2. Presented to the ED complaining of shortness of breath for the past week has been worsening with no improvement after inhaler therapies. Patient was seen by his hematologist yesterday ordered a CT scan which showed increasing pleural effusion from previous studies. Since seen by pulmonologist, who has him on prednisone taper and multiple inhalers. Hematology giving him IVIG infusions with poor response.  ED Course: Severe short of breath no really hypoxic but needing oxygen for comfort. Platelet of 5, CT shows a large right pleural effusion. WBC 16.2, SVT up to 150. Converted with no intervention. Patient to be admitted to step down, for aggressive diuresis and further monitoring.  Interim history  Admitted for pleural effusion. Attempting to diurese patient. Patient developed SVT, cardiology consulted.  Hem/Onc consulted for platelets <5. Given 2u on 2/12.  S/p thoracentesis 2/14.  Additional platelet transfusion 2/16.     Assessment & Plan   Pleural effusion -Initial CXR showed pleural effusions -CT chest showed large bilateral pleural effusions  Pt had thoracentesis 2/14 with improvement.  -Admitting Physician discuss with Dr. Ashok Cordia with PCCM, patient not a candidate for thoracentesis until  platelet count improves. They recommended aggressive diuresis. If patient became unstable, transfuse platelets and placed chest tube. -Placed on Lasix, will continue for now -Monitor intake and output, down 4.5L since admission -Pt reports breathing better but stamina has been very poor with any movement or ambulation.  HHPT arranged for patient.    Dyspnea - Pt reports that dyspnea is not better but has been refusing nebs ordered, I asked him to reconsider doing the nebs as they might help improve his symptoms and help him have less SOB with movement.  He is going home on oxygen 3L / min.  Arranged prior to discharge.  PT did not feel that he would benefit from SNF rehab.  See notes. HHPT arranged for patient.    Cough - explained to patient that likely is precipitated by wheezing but he is refusing nebs, likely would improve if he takes the neb treatments, ordered tussionex PRN and 5 days of doxycycline at discharge.   Follow up with pulmonologist Dr. Melvyn Novas.   PSVT resolved now -Patient initially has had heart rates in the 180s. -Echocardiogram EF 0000000, grade 1 diastolic dysfunction -Magnesium repleted -Potassium repleted -Cardiology consulted and appreciated -Discontinued amiodarone per cardiology - see notes. -HR controlled, Pt refused to take amiodarone 2/16 -cardiology says it can be stopped at discharge  Urinary tract infection -As above -Fully Treated with ceftriaxone IV  Bronchiectasis -Patient has been on high-dose prednisone in the past -taken off prednisone per hem/onc -Ordered nebs in hospital but patient has refused, Home on supplemental oxygen (Pt has been refusing nebs) -Pulmonology consulted but not following, Pt to follow up with Dr. Melvyn Novas his outpatient pulmonologist   ITP -Patient follows with Dr. [name], oncology. -Has been receiving IVIG infusions/  rituximab, he received last infusion just prior to discharge -Continue to monitor CBC outpatient with  hematologist -Platelets less than 5 but no bleeding -I spoke with Dr. [name] who will follow patient for ongoing management  Diabetes mellitus, type 2 complicated by hypoglycemia -Metformin held, continue insulin sliding scale and CBG monitoring -blood sugars better controlled, pt refused to take the lantus so it was discontinued, he is off prednisone now and his blood sugars much better  CBG (last 3)   Recent Labs (last 2 labs)    Recent Labs  06/18/16 2112 06/19/16 0521 06/19/16 0734  GLUCAP 97 184* 172*     Hypomagnesemia - Repleted.     DVT Prophylaxis  SCDs, encourage ambulation  Code Status: Full  Family Communication: wife updated telephone  Disposition Plan: SNF, consulted social worker  Consultants Cardiology PCCM Oncology, Dr. 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   Increase activity slowly    Complete by:  As directed      Allergies as of 06/20/2016   No Known Allergies     Medication List    STOP taking these medications   losartan 50 MG tablet Commonly known as:  COZAAR   predniSONE 20 MG tablet Commonly known as:  DELTASONE   TOUJEO SOLOSTAR 300 UNIT/ML Sopn Generic drug:  Insulin Glargine     TAKE these medications   albuterol 108 (90 Base) MCG/ACT inhaler Commonly known as:  PROAIR HFA 2 puffs every 4 hours as needed only  if your can't catch your breath   aminocaproic acid 500 MG tablet Commonly known as:  AMICAR Take 2 tablets (1,000 mg total) by mouth every 8 (eight) hours. As Needed   chlorpheniramine-HYDROcodone 10-8 MG/5ML Suer Commonly known as:  TUSSIONEX Take 5  mLs by mouth at bedtime as needed for cough.   dextromethorphan-guaiFENesin 30-600 MG 12hr tablet Commonly known as:  MUCINEX DM Take 1 tablet by mouth 2 (two) times daily as needed for cough.   doxycycline 100 MG capsule Commonly known as:  VIBRAMYCIN Take 1 capsule (100 mg total) by mouth 2 (two) times daily.   ECHINACEA EXTRACT PO Take 1 capsule by mouth daily.   famotidine 20 MG tablet Commonly known as:  PEPCID One at bedtime   FLUTTER Devi Use as directed   furosemide 20 MG tablet Commonly known as:  LASIX Take 20 mg by mouth daily.   GINKGO BILOBA PO Take 1 tablet by mouth daily.   GLUCOSAMINE 1500 COMPLEX PO Take 1,500 mg by mouth 2 (two) times daily.   loratadine 10 MG tablet Commonly known as:  CLARITIN Take 10 mg by mouth daily.   metFORMIN 1000 MG tablet Commonly known as:  GLUCOPHAGE Take 1,000 mg by mouth 2 (two) times daily with a meal.   MULTIVITAMIN PO Take by mouth 1 day or 1 dose.   pantoprazole 40 MG tablet Commonly known as:  PROTONIX Take 1 tablet (40 mg total) by mouth daily. Take 30-60 min before first meal of the day   simvastatin 80 MG tablet Commonly known as:  ZOCOR Take 80 mg by mouth daily.  Durable Medical Equipment        Start     Ordered   06/20/16 1305  For home use only DME oxygen  Once    Question Answer Comment  Mode or (Route) Nasal cannula   Liters per Minute 3   Frequency Continuous (stationary and portable oxygen unit needed)   Oxygen conserving device Yes   Oxygen delivery system Gas      06/20/16 1304     Follow-up Information    [name], MD. Schedule an appointment as soon as possible for a visit in 1 week(s).   Specialty:  Family Medicine Contact information: [address] [place] [address] [phone]        Rulon Eisenmenger, MD. Schedule an appointment as soon as possible for a visit in 1 week(s).   Specialty:  Hematology and Oncology Contact information: [place] [address] [phone]          No Known Allergies  Procedures/Studies: Dg Chest 2 View  Result Date: 06/17/2016 CLINICAL DATA:  Shortness of breath, history type II diabetes mellitus and ITP EXAM: CHEST  2 VIEW COMPARISON:  06/15/2016 FINDINGS: Normal heart size, mediastinal contours, and pulmonary vascularity. Atherosclerotic calcification aorta. Central peribronchial thickening. Bibasilar pleural effusions and atelectasis. Upper lungs clear. No pneumothorax. Bones demineralized. IMPRESSION: Bronchitic changes with persistent bibasilar pleural effusions and atelectasis. Aortic atherosclerosis. Electronically Signed   By: Lavonia Dana M.D.   On: 06/17/2016 09:07   Dg Chest 2 View  Result Date: 06/10/2016 CLINICAL DATA:  Dyspnea EXAM: CHEST  2 VIEW COMPARISON:  02/17/2016 chest radiograph. FINDINGS: Stable cardiomediastinal silhouette with normal heart size. No pneumothorax. Small right and small to moderate left pleural effusions, stable on the right and increased on the left. Emphysema. Patchy bibasilar lung opacities, increased on the left and stable on the right. IMPRESSION: 1. Stable small right pleural effusion. Increased small to moderate left pleural effusion. 2. Patchy bibasilar lung opacities, stable on the right and increased on the left, favor atelectasis. 3. Emphysema. Electronically Signed   By: Ilona Sorrel M.D.   On: 06/10/2016 11:07   Ct Chest W Contrast  Result Date: 06/09/2016 CLINICAL DATA:  Is breath for 3-4 months. Pleural effusion. Acute ITP. EXAM: CT CHEST, ABDOMEN, AND PELVIS WITH CONTRAST TECHNIQUE: Multidetector CT imaging of the chest, abdomen and pelvis was performed following the standard protocol during bolus administration of intravenous contrast. CONTRAST:  110mL ISOVUE-300 IOPAMIDOL (ISOVUE-300) INJECTION 61% COMPARISON:  Multiple exams, including chest CT from 05/05/2016 and abdominal CT from 11/22/2008 FINDINGS: CT CHEST  FINDINGS Cardiovascular: Coronary, aortic arch, and branch vessel atherosclerotic vascular disease. Mediastinum/Nodes: Part of the patient's face was scanned and shows bilateral maxillary sinusitis. No pathologic adenopathy in the chest. Lungs/Pleura: Large bilateral pleural effusions with passive atelectasis. Nonspecific for transudative versus exudative etiology but the effusions for the most part appear free-flowing. Peripheral interstitial accentuation in both lungs. Probable scarring scarring and slight nodularity scattered in the right upper lobe. Airspace opacity in the right middle lobe. Bilateral airway thickening. Musculoskeletal: Lower cervical and thoracic spondylosis. CT ABDOMEN PELVIS FINDINGS Hepatobiliary: Unremarkable Pancreas: Unremarkable Spleen: Unremarkable Adrenals/Urinary Tract: Adrenal glands normal. Right kidney upper pole benign-appearing cyst 1.4 cm on image 69/2. Left mid to lower kidney 2.2 cm benign-appearing cyst on image 79/2. Several additional hypodense renal lesions are technically too small to characterize although statistically likely to be cysts. Stomach/Bowel: Descending colon and proximal sigmoid colon diverticulosis. No active diverticulitis identified.  Vascular/Lymphatic: Aortoiliac atherosclerotic vascular disease. No adenopathy identified. Reproductive: Several punctate calcifications in the central gland. Other: Trace ascites along the left paracolic gutter Musculoskeletal: Degenerative arthropathy of both hips. Lumbar spondylosis and degenerative disc disease. IMPRESSION: 1. No adenopathy or mass is identified in the chest, abdomen, or pelvis. 2. Large bilateral pleural effusions with passive atelectasis. There is some scattered scarring and nodularity in both lungs along with peripheral interstitial accentuation which may reflect mild fibrosis. 3. Trace ascites in the left paracolic gutter. 4. Airway thickening is present, suggesting bronchitis or reactive airways  disease. 5. Other imaging findings of potential clinical significance: Coronary, aortic arch, and branch vessel atherosclerotic vascular disease. Bilateral chronic appearing maxillary sinusitis. Cervical, thoracic, and lumbar spondylosis. Degenerative arthropathy of both hips. Bilateral renal cysts. Descending and proximal sigmoid colon diverticulosis. Aortoiliac atherosclerotic vascular disease. Electronically Signed   By: Van Clines M.D.   On: 06/09/2016 16:29   Ct Abdomen Pelvis W Contrast  Result Date: 06/09/2016 CLINICAL DATA:  Is breath for 3-4 months. Pleural effusion. Acute ITP. EXAM: CT CHEST, ABDOMEN, AND PELVIS WITH CONTRAST TECHNIQUE: Multidetector CT imaging of the chest, abdomen and pelvis was performed following the standard protocol during bolus administration of intravenous contrast. CONTRAST:  18mL ISOVUE-300 IOPAMIDOL (ISOVUE-300) INJECTION 61% COMPARISON:  Multiple exams, including chest CT from 05/05/2016 and abdominal CT from 11/22/2008 FINDINGS: CT CHEST FINDINGS Cardiovascular: Coronary, aortic arch, and branch vessel atherosclerotic vascular disease. Mediastinum/Nodes: Part of the patient's face was scanned and shows bilateral maxillary sinusitis. No pathologic adenopathy in the chest. Lungs/Pleura: Large bilateral pleural effusions with passive atelectasis. Nonspecific for transudative versus exudative etiology but the effusions for the most part appear free-flowing. Peripheral interstitial accentuation in both lungs. Probable scarring scarring and slight nodularity scattered in the right upper lobe. Airspace opacity in the right middle lobe. Bilateral airway thickening. Musculoskeletal: Lower cervical and thoracic spondylosis. CT ABDOMEN PELVIS FINDINGS Hepatobiliary: Unremarkable Pancreas: Unremarkable Spleen: Unremarkable Adrenals/Urinary Tract: Adrenal glands normal. Right kidney upper pole benign-appearing cyst 1.4 cm on image 69/2. Left mid to lower kidney 2.2 cm  benign-appearing cyst on image 79/2. Several additional hypodense renal lesions are technically too small to characterize although statistically likely to be cysts. Stomach/Bowel: Descending colon and proximal sigmoid colon diverticulosis. No active diverticulitis identified. Vascular/Lymphatic: Aortoiliac atherosclerotic vascular disease. No adenopathy identified. Reproductive: Several punctate calcifications in the central gland. Other: Trace ascites along the left paracolic gutter Musculoskeletal: Degenerative arthropathy of both hips. Lumbar spondylosis and degenerative disc disease. IMPRESSION: 1. No adenopathy or mass is identified in the chest, abdomen, or pelvis. 2. Large bilateral pleural effusions with passive atelectasis. There is some scattered scarring and nodularity in both lungs along with peripheral interstitial accentuation which may reflect mild fibrosis. 3. Trace ascites in the left paracolic gutter. 4. Airway thickening is present, suggesting bronchitis or reactive airways disease. 5. Other imaging findings of potential clinical significance: Coronary, aortic arch, and branch vessel atherosclerotic vascular disease. Bilateral chronic appearing maxillary sinusitis. Cervical, thoracic, and lumbar spondylosis. Degenerative arthropathy of both hips. Bilateral renal cysts. Descending and proximal sigmoid colon diverticulosis. Aortoiliac atherosclerotic vascular disease. Electronically Signed   By: Van Clines M.D.   On: 06/09/2016 16:29   Dg Chest Port 1 View  Result Date: 06/15/2016 CLINICAL DATA:  Follow-up left pneumothorax and pleural effusion EXAM: PORTABLE CHEST 1 VIEW COMPARISON:  Chest radiograph from one day prior. FINDINGS: Stable cardiomediastinal silhouette with normal heart size. No appreciable residual pneumothorax. Stable small bilateral pleural effusions. No pulmonary edema. Low  lung volumes. Stable bibasilar lung opacities. IMPRESSION: 1. No appreciable residual  pneumothorax . 2. Stable low lung volumes, small bilateral pleural effusions and bibasilar lung opacities likely representing atelectasis. Electronically Signed   By: Ilona Sorrel M.D.   On: 06/15/2016 08:06   Dg Chest Port 1 View  Result Date: 06/14/2016 CLINICAL DATA:  Status post LEFT thoracentesis. EXAM: PORTABLE CHEST 1 VIEW COMPARISON:  06/12/2016. FINDINGS: The heart is enlarged. Moderate-size BILATERAL pleural effusions are present, with subsequent decrease of the LEFT pleural effusion. There is a tiny area at the LEFT lung apex which could represent a very small pneumothorax. Unchanged mild perihilar interstitial prominence. Compared with priors, the RIGHT effusion is significantly larger. Airspace opacity at the RIGHT base could represent developing infiltrate. IMPRESSION: Decrease LEFT effusion status post thoracentesis. Concern for tiny apical pneumothorax on the LEFT. Consider follow-up film in 2-3 hours. Findings discussed with Dr. Anselm Pancoast. Worsening aeration on the RIGHT. Most notably, increased RIGHT effusion and increasing lower lobe airspace opacity. Electronically Signed   By: Staci Righter M.D.   On: 06/14/2016 17:07   Dg Chest Port 1 View  Result Date: 06/12/2016 CLINICAL DATA:  Pleural effusion EXAM: PORTABLE CHEST 1 VIEW COMPARISON:  06/10/2016 FINDINGS: Cardiomediastinal silhouette is stable. Subtle mild perihilar interstitial prominence suspicious for mild pulmonary edema. Stable small right pleural effusion. Stable left basilar atelectasis or infiltrate. Right basilar atelectasis. IMPRESSION: Subtle mild perihilar interstitial prominence suspicious for mild pulmonary edema. Stable small right pleural effusion. Stable left basilar atelectasis or infiltrate. Right basilar atelectasis. Electronically Signed   By: Lahoma Crocker M.D.   On: 06/12/2016 08:49   US Thoracentesis Asp Pleural Space W/img Guide  Result Date: 06/14/2016 INDICATION: 76 year old with shortness of breath and  bilateral pleural effusions. Patient is being treated for ITP and has severe thrombocytopenia. Platelet infusion during the procedure. EXAM: ULTRASOUND GUIDED LEFT THORACENTESIS MEDICATIONS: None. COMPLICATIONS: None immediate. PROCEDURE: An ultrasound guided thoracentesis was thoroughly discussed with the patient and questions answered. The benefits, risks, alternatives and complications were also discussed. The patient understands and wishes to proceed with the procedure. Written consent was obtained. Ultrasound was performed to localize and mark an adequate pocket of fluid in the left chest. The area was then prepped and draped in the normal sterile fashion. 1% Lidocaine was used for local anesthesia. Under ultrasound guidance a 6 Fr Safe-T-Centesis catheter was introduced. Thoracentesis was performed. Procedure was stopped when the patient was complaining of mild chest pain. The catheter was removed and a dressing applied. FINDINGS: A total of approximately 1.5 L of dark bloody fluid was removed. Samples were sent to the laboratory as requested by the clinical team. IMPRESSION: Successful ultrasound guided left thoracentesis yielding 1.5 L of pleural fluid. Electronically Signed   By: Markus Daft M.D.   On: 06/14/2016 17:19     Subjective: Pt reports that he has been less SOB and ambulating better.   Discharge Exam: Vitals:   06/20/16 1436 06/20/16 1509  BP: 118/62 (!) 115/59  Pulse: 97 95  Resp: 20 18  Temp: 98.2 F (36.8 C) 98.2 F (36.8 C)   Vitals:   06/20/16 1037 06/20/16 1354 06/20/16 1436 06/20/16 1509  BP:  116/62 118/62 (!) 115/59  Pulse:  100 97 95  Resp:   20 18  Temp:  98.4 F (36.9 C) 98.2 F (36.8 C) 98.2 F (36.8 C)  TempSrc:  Oral Oral Oral  SpO2: 95% 100% 100% 99%  Weight:      Height:  General: Pt is alert, awake, not in acute distress Cardiovascular: RRR, S1/S2 +, no rubs, no gallops Respiratory: better air movement bilateral, upper airway congestion  noise Abdominal: Soft, NT, ND, bowel sounds + Extremities:  no cyanosis  The results of significant diagnostics from this hospitalization (including imaging, microbiology, ancillary and laboratory) are listed below for reference.     Microbiology: Recent Results (from the past 240 hour(s))  MRSA PCR Screening     Status: None   Collection Time: 06/10/16  4:49 PM  Result Value Ref Range Status   MRSA by PCR NEGATIVE NEGATIVE Final    Comment:        The GeneXpert MRSA Assay (FDA approved for NASAL specimens only), is one component of a comprehensive MRSA colonization surveillance program. It is not intended to diagnose MRSA infection nor to guide or monitor treatment for MRSA infections.   Body fluid culture     Status: None   Collection Time: 06/14/16  4:54 PM  Result Value Ref Range Status   Specimen Description Pleural, L  Final   Special Requests NONE  Final   Gram Stain   Final    MODERATE WBC PRESENT,BOTH PMN AND MONONUCLEAR NO ORGANISMS SEEN    Culture   Final    NO GROWTH 3 DAYS Performed at [facility], [place] [address], [place], [place] [address]    Report Status 06/18/2016 FINAL  Final     Labs: BNP (last 3 results)  Recent Labs  06/10/16 1107  BNP [id number]*   Basic Metabolic Panel:  Recent Labs Lab 06/14/16 0355 06/15/16 0342 06/16/16 0353 06/17/16 0539 06/19/16 0523  NA 130* 133* 136 138 135  K 5.0 3.5 3.6 3.3* 4.3  CL 82* 83* 82* 85* 85*  CO2 42* 43* 45* 45* 45*  GLUCOSE 222* 148* 162* 118* 184*  BUN 21* 21* 21* 19 20  CREATININE 0.83 0.78 0.87 0.74 0.80  CALCIUM 8.2* 8.0* 8.3* 8.5* 8.9  MG  --  1.6* 1.8  --   --    Liver Function Tests: No results for input(s): AST, ALT, ALKPHOS, BILITOT, PROT, ALBUMIN in the last 168 hours. No results for input(s): LIPASE, AMYLASE in the last 168 hours. No results for input(s): AMMONIA in the last 168 hours. CBC:  Recent Labs Lab 06/14/16 0355 06/15/16 0342 06/16/16 0353 06/17/16 0539  06/19/16 0523  WBC 9.8 8.8 7.8 8.3 7.2  NEUTROABS  --  7.4  --   --   --   HGB 8.9* 8.6* 8.5* 8.2* 8.8*  HCT 28.4* 27.7* 27.6* 26.5* 29.8*  MCV 92.2 92.6 92.6 92.3 92.5  PLT 13* 10* <5* <5* <5*   Cardiac Enzymes: No results for input(s): CKTOTAL, CKMB, CKMBINDEX, TROPONINI in the last 168 hours. BNP: Invalid input(s): POCBNP CBG:  Recent Labs Lab 06/19/16 1702 06/19/16 2144 06/20/16 0256 06/20/16 0728 06/20/16 1135  GLUCAP 393* 89 162* 169* 257*   D-Dimer No results for input(s): DDIMER in the last 72 hours. Hgb A1c No results for input(s): HGBA1C in the last 72 hours. Lipid Profile No results for input(s): CHOL, HDL, LDLCALC, TRIG, CHOLHDL, LDLDIRECT in the last 72 hours. Thyroid function studies No results for input(s): TSH, T4TOTAL, T3FREE, THYROIDAB in the last 72 hours.  Invalid input(s): FREET3 Anemia work up No results for input(s): VITAMINB12, FOLATE, FERRITIN, TIBC, IRON, RETICCTPCT in the last 72 hours. Urinalysis    Component Value Date/Time   COLORURINE YELLOW 06/10/2016 [place] 06/10/2016 1155  LABSPEC 1.010 06/10/2016 1155   PHURINE 5.0 06/10/2016 1155   GLUCOSEU NEGATIVE 06/10/2016 1155   HGBUR MODERATE (A) 06/10/2016 1155   BILIRUBINUR NEGATIVE 06/10/2016 1155   KETONESUR NEGATIVE 06/10/2016 1155   PROTEINUR NEGATIVE 06/10/2016 1155   UROBILINOGEN 1.0 11/22/2008 1922   NITRITE NEGATIVE 06/10/2016 1155   LEUKOCYTESUR LARGE (A) 06/10/2016 1155   Sepsis Labs Invalid input(s): PROCALCITONIN,  WBC,  LACTICIDVEN Microbiology Recent Results (from the past 240 hour(s))  MRSA PCR Screening     Status: None   Collection Time: 06/10/16  4:49 PM  Result Value Ref Range Status   MRSA by PCR NEGATIVE NEGATIVE Final    Comment:        The GeneXpert MRSA Assay (FDA approved for NASAL specimens only), is one component of a comprehensive MRSA colonization surveillance program. It is not intended to diagnose MRSA infection nor to  guide or monitor treatment for MRSA infections.   Body fluid culture     Status: None   Collection Time: 06/14/16  4:54 PM  Result Value Ref Range Status   Specimen Description Pleural, L  Final   Special Requests NONE  Final   Gram Stain   Final    MODERATE WBC PRESENT,BOTH PMN AND MONONUCLEAR NO ORGANISMS SEEN    Culture   Final    NO GROWTH 3 DAYS Performed at [facility], [place] [address] [place], [place] [address]    Report Status 06/18/2016 FINAL  Final   Time coordinating discharge: 32 minutes  SIGNED:  Irwin Brakeman, MD  [facility] 06/20/2016, 3:17 PM Pager   If 7PM-7AM, please contact night-coverage [email or web address] Password [email or web address]

## 2016-06-20 NOTE — Progress Notes (Cosign Needed)
Rituxan dose and dilution verified with Drue Dun, RN.

## 2016-06-20 NOTE — Care Management Note (Signed)
Case Management Note  Patient Details  Name: Markie Sopp MRN: [id number] Date of Birth: 09-21-1940  Subjective/Objective:  [facility] HH rep Malachy Mood will come to talk to patient/spouse directly in rm about HHC services-awaiting HHPT,f43f order. Qualifies for home 02-awaiting home 02 order, & home rw order-[facility] dme rep Kim aware.Noted for transfer to 3W-1 dose of chemo.                  Action/Plan:d/c plan home w/HHC/dme.   Expected Discharge Date:                  Expected Discharge Plan:  [place]  In-House Referral:     Discharge planning Services  CM Consult  Post Acute Care Choice:    Choice offered to:  Patient  DME Arranged:    DME Agency:  [place]:    [place]:  [place]  Status of Service:  In process, will continue to follow  If discussed at Long Length of Stay Meetings, dates discussed:    Additional Comments:  Dessa Phi, RN 06/20/2016, 12:56 PM

## 2016-06-21 ENCOUNTER — Other Ambulatory Visit: Payer: Self-pay | Admitting: Hematology and Oncology

## 2016-06-21 ENCOUNTER — Telehealth: Payer: Self-pay

## 2016-06-21 MED ORDER — ELTROMBOPAG OLAMINE 50 MG PO TABS: 50.0000 mg | ORAL_TABLET | Freq: Every day | ORAL | 0 refills | Status: DC

## 2016-06-21 NOTE — Telephone Encounter (Signed)
error 

## 2016-06-22 ENCOUNTER — Telehealth: Payer: Self-pay | Admitting: Pharmacist

## 2016-06-22 NOTE — Telephone Encounter (Signed)
Oral Chemotherapy Pharmacist Encounter  Received notification from [place] that patient's new Promacta prescription would require prior authorization. PA submitted on CoverMyMeds Key [id number] Status is pending.  Oral Oncology Clinic will continue to follow.  Johny Drilling, PharmD, BCPS, BCOP 06/22/2016  4:56 PM Oral Oncology Clinic [phone]

## 2016-06-23 ENCOUNTER — Other Ambulatory Visit: Payer: Self-pay

## 2016-06-23 ENCOUNTER — Other Ambulatory Visit: Payer: Self-pay | Admitting: Pharmacist

## 2016-06-23 ENCOUNTER — Telehealth: Payer: Self-pay

## 2016-06-23 DIAGNOSIS — D693 Immune thrombocytopenic purpura: Secondary | ICD-10-CM

## 2016-06-23 MED ORDER — AMICAR 500 MG PO TABS: ORAL_TABLET | ORAL | 1 refills | Status: DC

## 2016-06-23 MED ORDER — PREDNISONE 10 MG PO TABS: 10.0000 mg | ORAL_TABLET | Freq: Every day | ORAL | 0 refills | Status: AC

## 2016-06-23 MED ORDER — PREDNISONE 5 MG PO TABS: 10.0000 mg | ORAL_TABLET | Freq: Every day | ORAL | 0 refills | Status: DC

## 2016-06-23 MED ORDER — ELTROMBOPAG OLAMINE 50 MG PO TABS: 50.0000 mg | ORAL_TABLET | Freq: Every day | ORAL | 0 refills | Status: DC

## 2016-06-23 MED ORDER — LOSARTAN POTASSIUM 50 MG PO TABS: 50.0000 mg | ORAL_TABLET | Freq: Every day | ORAL | 0 refills | Status: DC

## 2016-06-23 MED ORDER — INSULIN GLARGINE 300 UNIT/ML ~~LOC~~ SOPN: 30.0000 [IU] | PEN_INJECTOR | Freq: Every day | SUBCUTANEOUS | Status: DC

## 2016-06-23 NOTE — Telephone Encounter (Signed)
Oral Chemotherapy Pharmacist Encounter  Received notification from [facility] that patient's Promacta copayment is [id number]. I will e-scribe prescription to [place] in hopes of a better copayment in network.  I called patient to alert him of copayment and plan for Promacta Rx. Patient informed me he also has a high copayment for his Amicar. I will e-scribe this medication to [facility] as well. Patient will call the Oral Oncology Clinic once he knows his [employer] copayment. If the copays are still high, we will assist patient in applying for manufacturer assistance for both medication.  All questions answered. Patient knows to call the clinic with questions or concerns.  Oral Oncology Clinic will continue to follow.  Johny Drilling, PharmD, BCPS, BCOP 06/23/2016  11:24 AM Oral Oncology Clinic [phone]

## 2016-06-23 NOTE — Progress Notes (Signed)
Oral Chemotherapy Pharmacist Encounter  E-scribed Promacta and Amicar to [place], PharmD, BCPS, BCOP 06/23/2016  3:37 PM Oral Oncology Clinic [phone]

## 2016-06-23 NOTE — Telephone Encounter (Signed)
Call received from Clair Gulling (physical therapist from [facility]) to obtain verbal orders for home care  PT/OT, 02, and nursing. Discussed with Dr.Gudena okay to proceed with home care plan. Clair Gulling also mentioned about some medication clarification that were d/c'd when pt was discharged from the hospital. Pt still takes prednisone 10mg  daily, cozaar 50mg  daily and toujeo daily. The mentioned medications will be added back on per discussion with Dr.Gudena. Will notify [facility] of plan.

## 2016-06-27 ENCOUNTER — Other Ambulatory Visit (HOSPITAL_BASED_OUTPATIENT_CLINIC_OR_DEPARTMENT_OTHER): Payer: Medicare HMO

## 2016-06-27 ENCOUNTER — Other Ambulatory Visit: Payer: Self-pay

## 2016-06-27 ENCOUNTER — Ambulatory Visit (HOSPITAL_COMMUNITY)
Admission: RE | Admit: 2016-06-27 | Discharge: 2016-06-27 | Disposition: A | Discharge status: Home / Self Care | Payer: Medicare HMO | Source: Ambulatory Visit | Attending: Hematology and Oncology | Admitting: Hematology and Oncology

## 2016-06-27 ENCOUNTER — Encounter: Payer: Self-pay | Admitting: Hematology and Oncology

## 2016-06-27 ENCOUNTER — Ambulatory Visit (HOSPITAL_BASED_OUTPATIENT_CLINIC_OR_DEPARTMENT_OTHER): Payer: Medicare HMO | Admitting: Hematology and Oncology

## 2016-06-27 ENCOUNTER — Telehealth: Payer: Self-pay | Admitting: Pharmacist

## 2016-06-27 VITALS — BP 109/56 | HR 120 | Temp 97.8°F | Resp 18 | Ht 71.0 in | Wt 204.5 lb

## 2016-06-27 DIAGNOSIS — D693 Immune thrombocytopenic purpura: Secondary | ICD-10-CM

## 2016-06-27 DIAGNOSIS — J90 Pleural effusion, not elsewhere classified: Secondary | ICD-10-CM | POA: Diagnosis not present

## 2016-06-27 DIAGNOSIS — I509 Heart failure, unspecified: Secondary | ICD-10-CM | POA: Diagnosis not present

## 2016-06-27 DIAGNOSIS — J9811 Atelectasis: Secondary | ICD-10-CM | POA: Insufficient documentation

## 2016-06-27 DIAGNOSIS — I471 Supraventricular tachycardia: Secondary | ICD-10-CM

## 2016-06-27 DIAGNOSIS — R0602 Shortness of breath: Secondary | ICD-10-CM

## 2016-06-27 LAB — CBC WITH DIFFERENTIAL/PLATELET
BASO%: 0.2 % (ref 0.0–2.0)
BASOS ABS: 0 10*3/uL (ref 0.0–0.1)
EOS%: 0.4 % (ref 0.0–7.0)
Eosinophils Absolute: 0.1 10*3/uL (ref 0.0–0.5)
HCT: 27.9 % — ABNORMAL LOW (ref 38.4–49.9)
HEMOGLOBIN: 8.4 g/dL — AB (ref 13.0–17.1)
LYMPH%: 8.7 % — AB (ref 14.0–49.0)
MCH: 27.7 pg (ref 27.2–33.4)
MCHC: 30.1 g/dL — AB (ref 32.0–36.0)
MCV: 92.1 fL (ref 79.3–98.0)
MONO#: 0.6 10*3/uL (ref 0.1–0.9)
MONO%: 4.9 % (ref 0.0–14.0)
NEUT#: 10 10*3/uL — ABNORMAL HIGH (ref 1.5–6.5)
NEUT%: 85.8 % — AB (ref 39.0–75.0)
Platelets: 3 10*3/uL — CL (ref 140–400)
RBC: 3.03 10*6/uL — ABNORMAL LOW (ref 4.20–5.82)
RDW: 15 % — ABNORMAL HIGH (ref 11.0–14.6)
WBC: 11.7 10*3/uL — ABNORMAL HIGH (ref 4.0–10.3)
lymph#: 1 10*3/uL (ref 0.9–3.3)
nRBC: 0 % (ref 0–0)

## 2016-06-27 MED ORDER — AMICAR 500 MG PO TABS: 2000.0000 mg | ORAL_TABLET | Freq: Three times a day (TID) | ORAL | 2 refills | Status: DC

## 2016-06-27 MED ORDER — AMINOCAPROIC ACID 500 MG PO TABS: 2000.0000 mg | ORAL_TABLET | Freq: Three times a day (TID) | ORAL | 2 refills | Status: DC

## 2016-06-27 MED ORDER — BUMETANIDE 1 MG PO TABS: 1.0000 mg | ORAL_TABLET | Freq: Two times a day (BID) | ORAL | 3 refills | Status: DC

## 2016-06-27 NOTE — Telephone Encounter (Signed)
Oral Chemotherapy Pharmacist Encounter  I met patient and wife in exam room to complete patient assistance applications for [employer] (Promacta) and [employer] (Amicar)  [employer] form faxed to [phone] NPAF phone number for follow-up: [phone]  [place] form faxed to [phone] [place] PAP phone number for follow-up: [phone]  This encounter will continue to be updated until final determination.  Oral Oncology Clinic will continue to follow.   Johny Drilling, PharmD, BCPS, BCOP 06/27/2016  11:35 AM Oral Oncology Clinic [phone]

## 2016-06-27 NOTE — Progress Notes (Signed)
Patient Care Team: Stephen Sacramento, MD as PCP - General (Family Medicine)  DIAGNOSIS:  Encounter Diagnoses  Name Primary?  . Acute ITP ([place])   . Pleural effusion Yes   CHIEF COMPLIANT: Follow-up of acute ITP and shortness of breath from pleural effusions  INTERVAL HISTORY: [name] is a 76 year old with above-mentioned history of acute ITP who has not responded to multiple lines of therapy including steroids, IVIG, Rituxan who was recently in the hospital for 10 days with severe shortness of breath and was found to have bilateral large pleural effusions. With great difficulty underwent left thoracentesis and after that he started to breathe better. However at home he is requiring oxygen 3-4 L. He also has a cough with expectoration. He gets very short of breath with this minimal walking.  REVIEW OF SYSTEMS:   Constitutional: Denies fevers, chills or abnormal weight loss Eyes: Denies blurriness of vision Ears, nose, mouth, throat, and face: Denies mucositis or sore throat Respiratory: Shortness of breath to minimal exertion, tachycardia, cough Cardiovascular: Denies palpitation, chest discomfort Gastrointestinal:  Denies nausea, heartburn or change in bowel habits Skin: Denies abnormal skin rashes Lymphatics: Denies new lymphadenopathy or easy bruising Neurological:Denies numbness, tingling or new weaknesses Behavioral/Psych: Mood is stable, no new changes  Extremities: Severe lower extremity edema All other systems were reviewed with the patient and are negative.  I have reviewed the past medical history, past surgical history, social history and family history with the patient and they are unchanged from previous note.  ALLERGIES:  has No Known Allergies.  MEDICATIONS:  Current Outpatient Prescriptions  Medication Sig Dispense Refill  . albuterol (PROAIR HFA) 108 (90 Base) MCG/ACT inhaler 2 puffs every 4 hours as needed only  if your can't catch your breath 1 Inhaler 11  .  AMICAR 500 MG tablet Take 4 tablets (2,000 mg total) by mouth every 8 (eight) hours. TAKE TWO TABLETS BY MOUTH EVERY 8 HOURS AS NEEDED 180 tablet 2  . bumetanide (BUMEX) 1 MG tablet Take 1 tablet (1 mg total) by mouth 2 (two) times daily. 60 tablet 3  . chlorpheniramine-HYDROcodone (TUSSIONEX) 10-8 MG/5ML SUER Take 5 mLs by mouth at bedtime as needed for cough. 115 mL 0  . dextromethorphan-guaiFENesin (MUCINEX DM) 30-600 MG 12hr tablet Take 1 tablet by mouth 2 (two) times daily as needed for cough.    Danella Deis EXTRACT PO Take 1 capsule by mouth daily.    Marland Kitchen eltrombopag (PROMACTA) 50 MG tablet Take 1 tablet (50 mg total) by mouth daily. Take on an empty stomach 1 hour before a meal or 2 hours after 30 tablet 0  . famotidine (PEPCID) 20 MG tablet One at bedtime    . GINKGO BILOBA PO Take 1 tablet by mouth daily.     . Glucosamine-Chondroit-Vit C-Mn (GLUCOSAMINE 1500 COMPLEX PO) Take 1,500 mg by mouth 2 (two) times daily.    . Insulin Glargine (TOUJEO SOLOSTAR) 300 UNIT/ML SOPN Inject 30 Units into the skin daily. 1 pen   . loratadine (CLARITIN) 10 MG tablet Take 10 mg by mouth daily.    Marland Kitchen losartan (COZAAR) 50 MG tablet Take 1 tablet (50 mg total) by mouth daily. 30 tablet 0  . metFORMIN (GLUCOPHAGE) 1000 MG tablet Take 1,000 mg by mouth 2 (two) times daily with a meal.    . Multiple Vitamins-Minerals (MULTIVITAMIN PO) Take by mouth 1 day or 1 dose.    . pantoprazole (PROTONIX) 40 MG tablet Take 1 tablet (40 mg total) by  mouth daily. Take 30-60 min before first meal of the day 30 tablet 2  . predniSONE (DELTASONE) 10 MG tablet Take 1 tablet (10 mg total) by mouth daily with breakfast. 30 tablet 0  . Respiratory Therapy Supplies (FLUTTER) DEVI Use as directed 1 each 0  . simvastatin (ZOCOR) 80 MG tablet Take 80 mg by mouth daily.      No current facility-administered medications for this visit.     PHYSICAL EXAMINATION: ECOG PERFORMANCE STATUS: 1 - Symptomatic but completely  ambulatory  Vitals:   06/27/16 1037  BP: (!) 109/56  Pulse: (!) 120  Resp: 18  Temp: 97.8 F (36.6 C)   Filed Weights   06/27/16 1037  Weight: 204 lb 8 oz (92.8 kg)    GENERAL:alert, no distress and comfortable SKIN: skin color, texture, turgor are normal, no rashes or significant lesions EYES: normal, Conjunctiva are pink and non-injected, sclera clear OROPHARYNX:no exudate, no erythema and lips, buccal mucosa, and tongue normal  NECK: supple, thyroid normal size, non-tender, without nodularity LYMPH:  no palpable lymphadenopathy in the cervical, axillary or inguinal LUNGS: Diminished breath sounds bilaterally at the bases with crackles HEART: regular rate & rhythm and no murmurs and no lower extremity edema ABDOMEN:abdomen soft, non-tender and normal bowel sounds MUSCULOSKELETAL:no cyanosis of digits and no clubbing  NEURO: alert & oriented x 3 with fluent speech, no focal motor/sensory deficits EXTREMITIES: 3+ lower extremity edema  LABORATORY DATA:  I have reviewed the data as listed   Chemistry      Component Value Date/Time   NA 135 06/19/2016 0523   NA 136 06/06/2016 1101   K 4.3 06/19/2016 0523   K 3.7 06/06/2016 1101   CL 85 (L) 06/19/2016 0523   CO2 45 (H) 06/19/2016 0523   CO2 32 (H) 06/06/2016 1101   BUN 20 06/19/2016 0523   BUN 24.5 06/06/2016 1101   CREATININE 0.80 06/19/2016 0523   CREATININE 0.8 06/06/2016 1101      Component Value Date/Time   CALCIUM 8.9 06/19/2016 0523   CALCIUM 8.9 06/06/2016 1101   ALKPHOS 56 06/11/2016 1044   ALKPHOS 71 06/06/2016 1101   AST 14 (L) 06/11/2016 1044   AST 14 06/06/2016 1101   ALT 13 (L) 06/11/2016 1044   ALT 18 06/06/2016 1101   BILITOT 0.5 06/11/2016 1044   BILITOT 0.56 06/06/2016 1101       Lab Results  Component Value Date   WBC 11.7 (H) 06/27/2016   HGB 8.4 (L) 06/27/2016   HCT 27.9 (L) 06/27/2016   MCV 92.1 06/27/2016   PLT 3 (LL) 06/27/2016   NEUTROABS 10.0 (H) 06/27/2016    ASSESSMENT &  PLAN:  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  Treatment summary: 1. High-dose prednisone: No improvement 2. S/P IVIG (lack of response) 3. Rituximab started 05/30/2016 weekly 4  (3 dose of Rituxan were given) Rituxan toxicities: Denies any major side effects Rituxan. 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 I have requested his insurance to authorize Promacta. Once that becomes available we will get started on it.  If Promacta does not get approved, then we will plan to treat him with Cytoxan. If Cytoxan does not work then splenectomy  may be the only option.  Shortness of breath to minimal exertion: Chest x-ray obtained today showed bibasilar pleural effusions to my review there are no different than when he was discharged from the hospital on 06/17/2016.  I spent 25 minutes talking to the patient of which more than half was spent in counseling and coordination of care.  Orders Placed This Encounter  Procedures  . DG Chest 2 View    Standing Status:   Future    Number of Occurrences:   1    Standing Expiration Date:   08/01/2017    Order Specific Question:   Reason for exam:    Answer:   recent thoracentesis evaluate for pleural effusion    Order Specific Question:   Preferred imaging location?    Answer:   [facility]  . CBC with Differential    Standing Status:   Future    Standing Expiration Date:   06/27/2017   The patient has a good understanding of the overall plan. he agrees with it. he will call with any problems that may develop before the next visit here.   Stephen Eisenmenger,  MD 06/27/16

## 2016-06-27 NOTE — Assessment & Plan Note (Signed)
Most likely precipitated by his underlying lung infection.  Treatment summary: 1. High-dose prednisone: No improvement 2. S/P IVIG (lack of response) 3. Rituximab started 05/30/2016 weekly 4 (3 dose of Rituxan were given) Rituxan toxicities: Denies any major side effects Rituxan. Bone marrow biopsy 05/30/2016: Findings suggestive of ITP with lots of megakaryocytes  Hospitalization: 06/10/2016- 06/20/2016 for respiratory distress due to large pleural effusion status post thoracentesis with platelet transfusion support (along with IVIG) Pleural fluid cytology: Benign  Plan: Patient will need fourth and final dose of Rituxan I have requested his insurance to authorize Promacta. Once that becomes available we will get started on it.

## 2016-06-27 NOTE — Telephone Encounter (Signed)
Oral Chemotherapy Pharmacist Encounter  Received notification from [facility] the prior authorization of patient's Antony Blackbird has been approved Referral# [id number] Effective dates: 04/29/16-12/21/16  I called [place] at [phone] to follow-up on Promacta and Amicar Rx's that were e-scribed on 06/23/16 Copayment for Promacta [id number] Unable to run test claim for Amicar as patient picked this up from local pharmacy last week and it is too early to fill again  Noted patient in to see MD today. Case discussed with MD and collaborative practice RN.  They will offer for me to see patient after MD is done to complete PAP applications for both the Promacta [employer]) and Amicar [employer]) if patient is agreeable.  Oral Oncology Clinic will continue to follow.   Johny Drilling, PharmD, BCPS, BCOP 06/27/2016  10:36 AM Oral Oncology Clinic [phone]

## 2016-07-03 ENCOUNTER — Encounter: Payer: Self-pay | Admitting: Hematology and Oncology

## 2016-07-03 ENCOUNTER — Ambulatory Visit: Payer: Medicare HMO | Admitting: Hematology and Oncology

## 2016-07-03 ENCOUNTER — Encounter (HOSPITAL_COMMUNITY): Payer: Self-pay | Admitting: Vascular Surgery

## 2016-07-03 ENCOUNTER — Other Ambulatory Visit: Payer: Medicare HMO

## 2016-07-03 ENCOUNTER — Telehealth (HOSPITAL_COMMUNITY): Payer: Self-pay | Admitting: Vascular Surgery

## 2016-07-03 ENCOUNTER — Encounter: Payer: Self-pay | Admitting: *Deleted

## 2016-07-03 ENCOUNTER — Ambulatory Visit (HOSPITAL_BASED_OUTPATIENT_CLINIC_OR_DEPARTMENT_OTHER): Payer: Medicare HMO | Admitting: Hematology and Oncology

## 2016-07-03 ENCOUNTER — Other Ambulatory Visit: Payer: Self-pay | Admitting: *Deleted

## 2016-07-03 ENCOUNTER — Other Ambulatory Visit (HOSPITAL_BASED_OUTPATIENT_CLINIC_OR_DEPARTMENT_OTHER): Payer: Medicare HMO

## 2016-07-03 DIAGNOSIS — D693 Immune thrombocytopenic purpura: Secondary | ICD-10-CM

## 2016-07-03 DIAGNOSIS — R0602 Shortness of breath: Secondary | ICD-10-CM

## 2016-07-03 DIAGNOSIS — J90 Pleural effusion, not elsewhere classified: Secondary | ICD-10-CM | POA: Diagnosis not present

## 2016-07-03 LAB — CBC WITH DIFFERENTIAL/PLATELET
BASO%: 0.3 % (ref 0.0–2.0)
BASOS ABS: 0 10*3/uL (ref 0.0–0.1)
EOS%: 0.3 % (ref 0.0–7.0)
Eosinophils Absolute: 0 10*3/uL (ref 0.0–0.5)
HCT: 28.1 % — ABNORMAL LOW (ref 38.4–49.9)
HGB: 8.3 g/dL — ABNORMAL LOW (ref 13.0–17.1)
LYMPH%: 11.9 % — AB (ref 14.0–49.0)
MCH: 26.9 pg — AB (ref 27.2–33.4)
MCHC: 29.5 g/dL — AB (ref 32.0–36.0)
MCV: 91.2 fL (ref 79.3–98.0)
MONO#: 0.8 10*3/uL (ref 0.1–0.9)
MONO%: 6.4 % (ref 0.0–14.0)
NEUT#: 9.4 10*3/uL — ABNORMAL HIGH (ref 1.5–6.5)
NEUT%: 81.1 % — AB (ref 39.0–75.0)
PLATELETS: 5 10*3/uL — AB (ref 140–400)
RBC: 3.08 10*6/uL — AB (ref 4.20–5.82)
RDW: 15.2 % — AB (ref 11.0–14.6)
WBC: 11.6 10*3/uL — ABNORMAL HIGH (ref 4.0–10.3)
lymph#: 1.4 10*3/uL (ref 0.9–3.3)
nRBC: 0 % (ref 0–0)

## 2016-07-03 MED ORDER — CYCLOPHOSPHAMIDE 50 MG PO TABS: 75.0000 mg/m2 | ORAL_TABLET | Freq: Every day | ORAL | 1 refills | Status: DC

## 2016-07-03 MED FILL — CYCLOPHOSPHAMIDE 50 MG CAP: 50 | 30 days supply | Qty: 90 | Fill #0

## 2016-07-03 NOTE — Progress Notes (Signed)
Patient Care Team: Christain Sacramento, MD as PCP - General (Family Medicine)  DIAGNOSIS:  Encounter Diagnosis  Name Primary?  . Acute ITP ([place])    CHIEF COMPLIANT: Hypotension, nosebleeds  INTERVAL HISTORY: [name] is a 76 year old with above-mentioned history of ITP who has failed prednisone, IVIG, Rituxan at his here today to discuss a treatment plan. He reports that he continues to have nosebleeds. On the recent day he had 6 hours of nosebleeds. However if he takes prophylactic Amicar he appears to be doing quite well. We have discussed about starting him on Promacta but his insurance even without authorization would cost a lot of money. Because of this, he elected to not pursue Promacta. He is here today to discuss what other options he may have. I added Bumex to his treatment and this appears to be helping his fluid retention. Is also complaining of having more coughing spells.  REVIEW OF SYSTEMS:   Constitutional: Denies fevers, chills or abnormal weight loss Eyes: Denies blurriness of vision Ears, nose, mouth, throat, and face: Denies mucositis or sore throat Respiratory: Shortness of breath to exertion, cough with expectoration Cardiovascular: Irregular Gastrointestinal:  Denies nausea, heartburn or change in bowel habits Skin: Denies abnormal skin rashes Lymphatics: Denies new lymphadenopathy or easy bruising Neurological:Denies numbness, tingling or new weaknesses Behavioral/Psych: Mood is stable, no new changes  Extremities: Severe lower extremity edema  All other systems were reviewed with the patient and are negative.  I have reviewed the past medical history, past surgical history, social history and family history with the patient and they are unchanged from previous note.  ALLERGIES:  has No Known Allergies.  MEDICATIONS:  Current Outpatient Prescriptions  Medication Sig Dispense Refill  . albuterol (PROAIR HFA) 108 (90 Base) MCG/ACT inhaler 2 puffs every 4  hours as needed only  if your can't catch your breath 1 Inhaler 11  . aminocaproic acid (AMICAR) 500 MG tablet Take 4 tablets (2,000 mg total) by mouth every 8 (eight) hours. 360 tablet 2  . bumetanide (BUMEX) 1 MG tablet Take 1 tablet (1 mg total) by mouth 2 (two) times daily. 60 tablet 3  . chlorpheniramine-HYDROcodone (TUSSIONEX) 10-8 MG/5ML SUER Take 5 mLs by mouth at bedtime as needed for cough. 115 mL 0  . cyclophosphamide (CYTOXAN) 50 MG tablet Take 3 tablets (150 mg total) by mouth daily. Give on an empty stomach 1 hour before or 2 hours after meals. 90 tablet 1  . dextromethorphan-guaiFENesin (MUCINEX DM) 30-600 MG 12hr tablet Take 1 tablet by mouth 2 (two) times daily as needed for cough.    Danella Deis EXTRACT PO Take 1 capsule by mouth daily.    . famotidine (PEPCID) 20 MG tablet One at bedtime    . GINKGO BILOBA PO Take 1 tablet by mouth daily.     . Glucosamine-Chondroit-Vit C-Mn (GLUCOSAMINE 1500 COMPLEX PO) Take 1,500 mg by mouth 2 (two) times daily.    . Insulin Glargine (TOUJEO SOLOSTAR) 300 UNIT/ML SOPN Inject 30 Units into the skin daily. 1 pen   . loratadine (CLARITIN) 10 MG tablet Take 10 mg by mouth daily.    Marland Kitchen losartan (COZAAR) 50 MG tablet Take 1 tablet (50 mg total) by mouth daily. 30 tablet 0  . metFORMIN (GLUCOPHAGE) 1000 MG tablet Take 1,000 mg by mouth 2 (two) times daily with a meal.    . Multiple Vitamins-Minerals (MULTIVITAMIN PO) Take by mouth 1 day or 1 dose.    . pantoprazole (PROTONIX) 40 MG  tablet Take 1 tablet (40 mg total) by mouth daily. Take 30-60 min before first meal of the day 30 tablet 2  . predniSONE (DELTASONE) 10 MG tablet Take 1 tablet (10 mg total) by mouth daily with breakfast. 30 tablet 0  . Respiratory Therapy Supplies (FLUTTER) DEVI Use as directed 1 each 0  . simvastatin (ZOCOR) 80 MG tablet Take 80 mg by mouth daily.      No current facility-administered medications for this visit.     PHYSICAL EXAMINATION: ECOG PERFORMANCE STATUS: 1 -  Symptomatic but completely ambulatory  Vitals:   07/03/16 1204 07/03/16 1206  BP: (!) 88/48 (!) 97/50  Pulse: (!) 116   Resp: 17    Filed Weights   07/03/16 1204  Weight: 201 lb 8 oz (91.4 kg)    GENERAL:alert, no distress and comfortable SKIN: skin color, texture, turgor are normal, no rashes or significant lesions EYES: normal, Conjunctiva are pink and non-injected, sclera clear OROPHARYNX:no exudate, no erythema and lips, buccal mucosa, and tongue normal  NECK: supple, thyroid normal size, non-tender, without nodularity LYMPH:  no palpable lymphadenopathy in the cervical, axillary or inguinal LUNGS: clear to auscultation and percussion with normal breathing effort HEART: regular rate & rhythm and no murmurs and no lower extremity edema ABDOMEN:abdomen soft, non-tender and normal bowel sounds MUSCULOSKELETAL:no cyanosis of digits and no clubbing  NEURO: alert & oriented x 3 with fluent speech, no focal motor/sensory deficits EXTREMITIES: 3+ lower extremity edema  LABORATORY DATA:  I have reviewed the data as listed   Chemistry      Component Value Date/Time   NA 135 06/19/2016 0523   NA 136 06/06/2016 1101   K 4.3 06/19/2016 0523   K 3.7 06/06/2016 1101   CL 85 (L) 06/19/2016 0523   CO2 45 (H) 06/19/2016 0523   CO2 32 (H) 06/06/2016 1101   BUN 20 06/19/2016 0523   BUN 24.5 06/06/2016 1101   CREATININE 0.80 06/19/2016 0523   CREATININE 0.8 06/06/2016 1101      Component Value Date/Time   CALCIUM 8.9 06/19/2016 0523   CALCIUM 8.9 06/06/2016 1101   ALKPHOS 56 06/11/2016 1044   ALKPHOS 71 06/06/2016 1101   AST 14 (L) 06/11/2016 1044   AST 14 06/06/2016 1101   ALT 13 (L) 06/11/2016 1044   ALT 18 06/06/2016 1101   BILITOT 0.5 06/11/2016 1044   BILITOT 0.56 06/06/2016 1101       Lab Results  Component Value Date   WBC 11.6 (H) 07/03/2016   HGB 8.3 (L) 07/03/2016   HCT 28.1 (L) 07/03/2016   MCV 91.2 07/03/2016   PLT 5 (LL) 07/03/2016   NEUTROABS 9.4 (H)  07/03/2016    ASSESSMENT & PLAN:  Acute ITP ([facility]) Most likely precipitated by his underlying lung infection.  Treatment summary: 1. High-dose prednisone: No improvement 2. S/P IVIG (lack of response) 3. Rituximab started 05/30/2016 weekly 4  (3 dose of Rituxan were given) Rituxan toxicities: Denies any major side effects Rituxan. Bone marrow biopsy 05/30/2016: Findings suggestive of ITP with lots of megakaryocytes  Hospitalization: 06/10/2016- 06/20/2016 for respiratory distress due to large pleural effusion status post thoracentesis with platelet transfusion support (along with IVIG) Pleural fluid cytology: Benign  Plan: Because of high expense related to Promacta, he decided not to take Promacta and would try Cytoxan. If Cytoxan was not going to work then he will undergo splenectomy.  Shortness of breath to minimal exertion: Chest x-ray obtained today showed bibasilar pleural effusions and findings  suggestive of congestive heart failure. Cardiology consultation requested. Currently on diuretics.  We will refer him to cardiology for adjustment of his medications are related to atrial fibrillation and congestive heart failure symptoms. I will be seeing the patient on a weekly basis. I will discuss his case with the surgeons to determine how we can get him through surgery for splenectomy with platelet transfusion.  I spent 25 minutes talking to the patient of which more than half was spent in counseling and coordination of care.  No orders of the defined types were placed in this encounter.  The patient has a good understanding of the overall plan. he agrees with it. he will call with any problems that may develop before the next visit here.   Rulon Eisenmenger, MD 07/03/16

## 2016-07-03 NOTE — Telephone Encounter (Signed)
3 rd attempt , pt has no no VM, sent letter

## 2016-07-03 NOTE — Telephone Encounter (Signed)
Pt does Not have VM . Will send letter to call office to make appt

## 2016-07-03 NOTE — Assessment & Plan Note (Signed)
Most likely precipitated by his underlying lung infection.  Treatment summary: 1. High-dose prednisone: No improvement 2. S/P IVIG (lack of response) 3. Rituximab started 05/30/2016 weekly 4  (3 dose of Rituxan were given) Rituxan toxicities: Denies any major side effects Rituxan. 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 I have requested his insurance to authorize Promacta. Once that becomes available we will get started on it.  If Promacta does not get approved, then we will plan to treat him with Cytoxan. If Cytoxan does not work then splenectomy may be the only option.  Shortness of breath to minimal exertion: Chest x-ray obtained today showed bibasilar pleural effusions and findings suggestive of congestive heart failure. Cardiology consultation requested. Currently on diuretics.

## 2016-07-04 ENCOUNTER — Telehealth (HOSPITAL_COMMUNITY): Payer: Self-pay | Admitting: *Deleted

## 2016-07-04 ENCOUNTER — Telehealth: Payer: Self-pay | Admitting: Emergency Medicine

## 2016-07-04 ENCOUNTER — Ambulatory Visit: Payer: Medicare HMO | Admitting: Hematology and Oncology

## 2016-07-04 ENCOUNTER — Other Ambulatory Visit: Payer: Medicare HMO

## 2016-07-04 NOTE — Telephone Encounter (Signed)
Spoke with patient's spouse; per Dr [name] he will contact Dr Haroldine Laws to see if he can see [name] sooner than 3/22. Per patient's wife; he is not currently taking any blood thinners or medications to control his heart rate.   Call also received from [facility]; patient requesting to delay PT this week and resume next week due to increased HR with exerction. Patient also requesting RN home visit; returned called and notified [place] that both of these are fine.

## 2016-07-04 NOTE — Telephone Encounter (Signed)
This is a new patient that has never been seen in our clinic, wife called requesting for prescription refills without having an appointment set.  I explained that we are unable to prescribe medications without seeing the patient first.  She did agree to make an appointment with Dr. Haroldine Laws but first available is March 22nd, wife was upset that we were unable to see him sooner.  [facility] has reached out several times to have appointment made for patient but she has not be able to reach them in the past. I advised her to keep the appointment for March 22nd and to contact Dr. Langston Masker for refills needed.

## 2016-07-05 ENCOUNTER — Other Ambulatory Visit: Payer: Self-pay

## 2016-07-05 ENCOUNTER — Telehealth: Payer: Self-pay | Admitting: Pharmacist

## 2016-07-05 NOTE — Telephone Encounter (Signed)
Oral Chemotherapy Pharmacist Encounter  Received notification from [place] that patient's cyclophosphamide would require prior authorization. MD has prescribed cyclophosphamide for ITP as patient has been unable to obtain Promacta due to prohibitive cost. PA submitted on 07/04/16 on CoverMyMeds Key [id number] Status is pending.  Oral Oncology Clinic will continue to follow.  Johny Drilling, PharmD, BCPS, BCOP 07/05/2016  9:14 AM Oral Oncology Clinic [phone]

## 2016-07-06 ENCOUNTER — Telehealth: Payer: Self-pay

## 2016-07-06 NOTE — Telephone Encounter (Signed)
Pt wife called to follow up on rescheduling their appointment with Dr.Bensimhon to an earlier time. Pt was originally scheduled for 3/22. Called Dr.Bensimhon's office to get him rescheduled. New appt time 07/10/16 at 9am. Pt had been keeping log of his BP/HR/Temp TID. Pt does feel intermittent dizziness at times when ambulating in the morning. Pt states that he had stopped taking his losartan because his BP has been running low in the 90/70's and HR low 100's. Pt is very cautious and feels okay. Pt very appreciative of new appt time with Dr.Bensimhon. Told pt to go to ED if symptoms worsen. Verbalized understanding.

## 2016-07-07 NOTE — Telephone Encounter (Signed)
Oral Chemotherapy Pharmacist Encounter  Received notification from [facility] Patient Assistance Program that patient had been DENIED for enrollment into their program. They have mailed a letter to the patient to indicate their denial reason and if an appeal is possible.  Received notification from [employer] Patient Assistance Program ([employer]) that patient has been temporarily enrolled into their program to be eligible to receive Promacta at $0 cost for 3 months pending identification of alternate sources of copayment funding. [employer] suggested the office attempt to enroll patient for copayment funds through Patient [facility]) grant for ITP.   This fund is currently closed to enrollment and this information has been faxed to [employer]. I have requested patient be considered for enrollment into their program.  This encounter will continue to be updated until final determination.  Oral Oncology Clinic will continue to follow.   Johny Drilling, PharmD, BCPS, BCOP 07/07/2016  10:21 AM Oral Oncology Clinic [phone]

## 2016-07-08 ENCOUNTER — Emergency Department (HOSPITAL_COMMUNITY): Payer: Medicare HMO

## 2016-07-08 ENCOUNTER — Inpatient Hospital Stay (HOSPITAL_COMMUNITY)
Admission: EM | Admit: 2016-07-08 | Discharge: 2016-08-02 | DRG: 264 | Disposition: A | Discharge status: Skilled Nursing Facility | Payer: Medicare HMO | Attending: Family Medicine | Admitting: Family Medicine

## 2016-07-08 ENCOUNTER — Encounter (HOSPITAL_COMMUNITY): Payer: Self-pay

## 2016-07-08 DIAGNOSIS — G9340 Encephalopathy, unspecified: Secondary | ICD-10-CM

## 2016-07-08 DIAGNOSIS — N471 Phimosis: Secondary | ICD-10-CM | POA: Diagnosis present

## 2016-07-08 DIAGNOSIS — Z800 Family history of malignant neoplasm of digestive organs: Secondary | ICD-10-CM

## 2016-07-08 DIAGNOSIS — M160 Bilateral primary osteoarthritis of hip: Secondary | ICD-10-CM | POA: Diagnosis present

## 2016-07-08 DIAGNOSIS — D6959 Other secondary thrombocytopenia: Secondary | ICD-10-CM

## 2016-07-08 DIAGNOSIS — J90 Pleural effusion, not elsewhere classified: Secondary | ICD-10-CM

## 2016-07-08 DIAGNOSIS — I5033 Acute on chronic diastolic (congestive) heart failure: Secondary | ICD-10-CM | POA: Diagnosis present

## 2016-07-08 DIAGNOSIS — I509 Heart failure, unspecified: Secondary | ICD-10-CM

## 2016-07-08 DIAGNOSIS — I480 Paroxysmal atrial fibrillation: Secondary | ICD-10-CM | POA: Diagnosis not present

## 2016-07-08 DIAGNOSIS — J440 Chronic obstructive pulmonary disease with acute lower respiratory infection: Secondary | ICD-10-CM | POA: Diagnosis present

## 2016-07-08 DIAGNOSIS — K219 Gastro-esophageal reflux disease without esophagitis: Secondary | ICD-10-CM | POA: Diagnosis present

## 2016-07-08 DIAGNOSIS — Y95 Nosocomial condition: Secondary | ICD-10-CM | POA: Diagnosis not present

## 2016-07-08 DIAGNOSIS — R040 Epistaxis: Secondary | ICD-10-CM | POA: Diagnosis not present

## 2016-07-08 DIAGNOSIS — R410 Disorientation, unspecified: Secondary | ICD-10-CM | POA: Diagnosis not present

## 2016-07-08 DIAGNOSIS — I959 Hypotension, unspecified: Secondary | ICD-10-CM

## 2016-07-08 DIAGNOSIS — E0781 Sick-euthyroid syndrome: Secondary | ICD-10-CM | POA: Diagnosis present

## 2016-07-08 DIAGNOSIS — G4700 Insomnia, unspecified: Secondary | ICD-10-CM | POA: Diagnosis present

## 2016-07-08 DIAGNOSIS — N179 Acute kidney failure, unspecified: Secondary | ICD-10-CM | POA: Diagnosis present

## 2016-07-08 DIAGNOSIS — M47816 Spondylosis without myelopathy or radiculopathy, lumbar region: Secondary | ICD-10-CM | POA: Diagnosis present

## 2016-07-08 DIAGNOSIS — R6521 Severe sepsis with septic shock: Secondary | ICD-10-CM | POA: Diagnosis not present

## 2016-07-08 DIAGNOSIS — E1165 Type 2 diabetes mellitus with hyperglycemia: Secondary | ICD-10-CM | POA: Diagnosis present

## 2016-07-08 DIAGNOSIS — R601 Generalized edema: Secondary | ICD-10-CM

## 2016-07-08 DIAGNOSIS — M47812 Spondylosis without myelopathy or radiculopathy, cervical region: Secondary | ICD-10-CM | POA: Diagnosis present

## 2016-07-08 DIAGNOSIS — D693 Immune thrombocytopenic purpura: Secondary | ICD-10-CM

## 2016-07-08 DIAGNOSIS — Z79899 Other long term (current) drug therapy: Secondary | ICD-10-CM

## 2016-07-08 DIAGNOSIS — Z86010 Personal history of colonic polyps: Secondary | ICD-10-CM

## 2016-07-08 DIAGNOSIS — A419 Sepsis, unspecified organism: Secondary | ICD-10-CM

## 2016-07-08 DIAGNOSIS — D62 Acute posthemorrhagic anemia: Secondary | ICD-10-CM | POA: Diagnosis present

## 2016-07-08 DIAGNOSIS — Z9911 Dependence on respirator [ventilator] status: Secondary | ICD-10-CM

## 2016-07-08 DIAGNOSIS — Z9981 Dependence on supplemental oxygen: Secondary | ICD-10-CM

## 2016-07-08 DIAGNOSIS — E873 Alkalosis: Secondary | ICD-10-CM | POA: Diagnosis not present

## 2016-07-08 DIAGNOSIS — R29898 Other symptoms and signs involving the musculoskeletal system: Secondary | ICD-10-CM

## 2016-07-08 DIAGNOSIS — K5730 Diverticulosis of large intestine without perforation or abscess without bleeding: Secondary | ICD-10-CM | POA: Diagnosis present

## 2016-07-08 DIAGNOSIS — I5032 Chronic diastolic (congestive) heart failure: Secondary | ICD-10-CM | POA: Diagnosis present

## 2016-07-08 DIAGNOSIS — I9589 Other hypotension: Secondary | ICD-10-CM

## 2016-07-08 DIAGNOSIS — Z8249 Family history of ischemic heart disease and other diseases of the circulatory system: Secondary | ICD-10-CM

## 2016-07-08 DIAGNOSIS — Z9289 Personal history of other medical treatment: Secondary | ICD-10-CM

## 2016-07-08 DIAGNOSIS — R946 Abnormal results of thyroid function studies: Secondary | ICD-10-CM | POA: Diagnosis present

## 2016-07-08 DIAGNOSIS — E870 Hyperosmolality and hypernatremia: Secondary | ICD-10-CM | POA: Diagnosis not present

## 2016-07-08 DIAGNOSIS — J9690 Respiratory failure, unspecified, unspecified whether with hypoxia or hypercapnia: Secondary | ICD-10-CM

## 2016-07-08 DIAGNOSIS — J324 Chronic pansinusitis: Secondary | ICD-10-CM | POA: Diagnosis present

## 2016-07-08 DIAGNOSIS — J948 Other specified pleural conditions: Secondary | ICD-10-CM | POA: Diagnosis present

## 2016-07-08 DIAGNOSIS — J869 Pyothorax without fistula: Secondary | ICD-10-CM

## 2016-07-08 DIAGNOSIS — J9600 Acute respiratory failure, unspecified whether with hypoxia or hypercapnia: Secondary | ICD-10-CM

## 2016-07-08 DIAGNOSIS — Z794 Long term (current) use of insulin: Secondary | ICD-10-CM

## 2016-07-08 DIAGNOSIS — Z4682 Encounter for fitting and adjustment of non-vascular catheter: Secondary | ICD-10-CM

## 2016-07-08 DIAGNOSIS — E878 Other disorders of electrolyte and fluid balance, not elsewhere classified: Secondary | ICD-10-CM

## 2016-07-08 DIAGNOSIS — Z9889 Other specified postprocedural states: Secondary | ICD-10-CM

## 2016-07-08 DIAGNOSIS — J942 Hemothorax: Secondary | ICD-10-CM | POA: Diagnosis present

## 2016-07-08 DIAGNOSIS — R0602 Shortness of breath: Secondary | ICD-10-CM

## 2016-07-08 DIAGNOSIS — I4891 Unspecified atrial fibrillation: Secondary | ICD-10-CM

## 2016-07-08 DIAGNOSIS — I700 Atherosclerosis of aorta: Secondary | ICD-10-CM | POA: Diagnosis present

## 2016-07-08 DIAGNOSIS — J9621 Acute and chronic respiratory failure with hypoxia: Secondary | ICD-10-CM | POA: Diagnosis not present

## 2016-07-08 DIAGNOSIS — E119 Type 2 diabetes mellitus without complications: Secondary | ICD-10-CM

## 2016-07-08 DIAGNOSIS — Z6825 Body mass index (BMI) 25.0-25.9, adult: Secondary | ICD-10-CM

## 2016-07-08 DIAGNOSIS — L8990 Pressure ulcer of unspecified site, unspecified stage: Secondary | ICD-10-CM | POA: Insufficient documentation

## 2016-07-08 DIAGNOSIS — R571 Hypovolemic shock: Secondary | ICD-10-CM | POA: Diagnosis not present

## 2016-07-08 DIAGNOSIS — K5900 Constipation, unspecified: Secondary | ICD-10-CM | POA: Diagnosis not present

## 2016-07-08 DIAGNOSIS — Z938 Other artificial opening status: Secondary | ICD-10-CM

## 2016-07-08 DIAGNOSIS — I471 Supraventricular tachycardia, unspecified: Secondary | ICD-10-CM

## 2016-07-08 DIAGNOSIS — E876 Hypokalemia: Secondary | ICD-10-CM | POA: Diagnosis not present

## 2016-07-08 DIAGNOSIS — I110 Hypertensive heart disease with heart failure: Secondary | ICD-10-CM | POA: Diagnosis present

## 2016-07-08 DIAGNOSIS — E43 Unspecified severe protein-calorie malnutrition: Secondary | ICD-10-CM

## 2016-07-08 DIAGNOSIS — Z87891 Personal history of nicotine dependence: Secondary | ICD-10-CM

## 2016-07-08 DIAGNOSIS — Z87442 Personal history of urinary calculi: Secondary | ICD-10-CM

## 2016-07-08 DIAGNOSIS — Z8379 Family history of other diseases of the digestive system: Secondary | ICD-10-CM

## 2016-07-08 DIAGNOSIS — J449 Chronic obstructive pulmonary disease, unspecified: Secondary | ICD-10-CM

## 2016-07-08 DIAGNOSIS — Z888 Allergy status to other drugs, medicaments and biological substances status: Secondary | ICD-10-CM

## 2016-07-08 DIAGNOSIS — E785 Hyperlipidemia, unspecified: Secondary | ICD-10-CM | POA: Diagnosis present

## 2016-07-08 DIAGNOSIS — Q8901 Asplenia (congenital): Secondary | ICD-10-CM

## 2016-07-08 DIAGNOSIS — Z7952 Long term (current) use of systemic steroids: Secondary | ICD-10-CM

## 2016-07-08 DIAGNOSIS — D638 Anemia in other chronic diseases classified elsewhere: Secondary | ICD-10-CM

## 2016-07-08 DIAGNOSIS — J189 Pneumonia, unspecified organism: Secondary | ICD-10-CM

## 2016-07-08 DIAGNOSIS — J151 Pneumonia due to Pseudomonas: Secondary | ICD-10-CM | POA: Diagnosis not present

## 2016-07-08 DIAGNOSIS — M47814 Spondylosis without myelopathy or radiculopathy, thoracic region: Secondary | ICD-10-CM | POA: Diagnosis present

## 2016-07-08 DIAGNOSIS — J9811 Atelectasis: Secondary | ICD-10-CM | POA: Diagnosis present

## 2016-07-08 DIAGNOSIS — Z23 Encounter for immunization: Secondary | ICD-10-CM

## 2016-07-08 HISTORY — DX: Chronic obstructive pulmonary disease, unspecified: J44.9

## 2016-07-08 HISTORY — DX: Immune thrombocytopenic purpura: D69.3

## 2016-07-08 HISTORY — DX: Pleural effusion, not elsewhere classified: J90

## 2016-07-08 HISTORY — DX: Essential (primary) hypertension: I10

## 2016-07-08 HISTORY — DX: Thrombocytopenia, unspecified: D69.6

## 2016-07-08 HISTORY — DX: Other secondary thrombocytopenia: D69.59

## 2016-07-08 LAB — CBC WITH DIFFERENTIAL/PLATELET
BASOS PCT: 0 %
Basophils Absolute: 0 10*3/uL (ref 0.0–0.1)
EOS ABS: 0.1 10*3/uL (ref 0.0–0.7)
Eosinophils Relative: 1 %
HEMATOCRIT: 34.6 % — AB (ref 39.0–52.0)
Hemoglobin: 11.2 g/dL — ABNORMAL LOW (ref 13.0–17.0)
LYMPHS ABS: 2.3 10*3/uL (ref 0.7–4.0)
Lymphocytes Relative: 25 %
MCH: 28.6 pg (ref 26.0–34.0)
MCHC: 32.4 g/dL (ref 30.0–36.0)
MCV: 88.3 fL (ref 78.0–100.0)
MONO ABS: 0.6 10*3/uL (ref 0.1–1.0)
Monocytes Relative: 7 %
Neutro Abs: 6.1 10*3/uL (ref 1.7–7.7)
Neutrophils Relative %: 67 %
Platelets: <5 10*3/uL — CL (ref 150–400)
RBC: 3.92 MIL/uL — ABNORMAL LOW (ref 4.22–5.81)
RDW: 14.9 % (ref 11.5–15.5)
WBC: 9.1 10*3/uL (ref 4.0–10.5)

## 2016-07-08 LAB — COMPREHENSIVE METABOLIC PANEL
ALT: 13 U/L — ABNORMAL LOW (ref 17–63)
AST: 24 U/L (ref 15–41)
Albumin: 2.7 g/dL — ABNORMAL LOW (ref 3.5–5.0)
Alkaline Phosphatase: 71 U/L (ref 38–126)
Anion gap: 12 (ref 5–15)
BILIRUBIN TOTAL: 0.4 mg/dL (ref 0.3–1.2)
BUN: 32 mg/dL — AB (ref 6–20)
CO2: 37 mmol/L — ABNORMAL HIGH (ref 22–32)
Calcium: 8.3 mg/dL — ABNORMAL LOW (ref 8.9–10.3)
Chloride: 85 mmol/L — ABNORMAL LOW (ref 101–111)
Creatinine, Ser: 1.23 mg/dL (ref 0.61–1.24)
GFR calc Af Amer: >60 mL/min (ref >60)
GFR calc non Af Amer: 56 mL/min — ABNORMAL LOW (ref >60)
Glucose, Bld: 206 mg/dL — ABNORMAL HIGH (ref 65–99)
POTASSIUM: 3.7 mmol/L (ref 3.5–5.1)
Sodium: 134 mmol/L — ABNORMAL LOW (ref 135–145)
TOTAL PROTEIN: 7 g/dL (ref 6.5–8.1)

## 2016-07-08 LAB — BRAIN NATRIURETIC PEPTIDE: B NATRIURETIC PEPTIDE 5: 113.8 pg/mL — AB (ref 0.0–100.0)

## 2016-07-08 LAB — TROPONIN I: Troponin I: <0.03 ng/mL (ref <0.03)

## 2016-07-08 MED ORDER — SODIUM CHLORIDE 0.9 % IV SOLN
1.0000 g | Freq: Once | INTRAVENOUS | Status: AC
  Administered 2016-07-08: 1 g via INTRAVENOUS
  Filled 2016-07-08: qty 10

## 2016-07-08 MED ORDER — DILTIAZEM LOAD VIA INFUSION
10.0000 mg | Freq: Once | INTRAVENOUS | Status: AC
  Administered 2016-07-08: 10 mg via INTRAVENOUS
  Filled 2016-07-08: qty 10

## 2016-07-08 MED ORDER — DILTIAZEM LOAD VIA INFUSION
20.0000 mg | Freq: Once | INTRAVENOUS | Status: AC
  Administered 2016-07-08: 20 mg via INTRAVENOUS
  Filled 2016-07-08: qty 20

## 2016-07-08 MED ORDER — DILTIAZEM HCL-DEXTROSE 100-5 MG/100ML-% IV SOLN (PREMIX)
5.0000 mg/h | INTRAVENOUS | Status: DC
  Administered 2016-07-08: 5 mg/h via INTRAVENOUS
  Administered 2016-07-09: 10 mg/h via INTRAVENOUS
  Administered 2016-07-09: 15 mg/h via INTRAVENOUS
  Administered 2016-07-10 (×3): 10 mg/h via INTRAVENOUS
  Administered 2016-07-11: 12.5 mg/h via INTRAVENOUS
  Administered 2016-07-11 – 2016-07-12 (×2): 10 mg/h via INTRAVENOUS
  Administered 2016-07-12: 6 mg/h via INTRAVENOUS
  Administered 2016-07-13 – 2016-07-14 (×3): 5 mg/h via INTRAVENOUS
  Filled 2016-07-08 (×18): qty 100

## 2016-07-08 MED ORDER — ETOMIDATE 2 MG/ML IV SOLN
10.0000 mg | Freq: Once | INTRAVENOUS | Status: AC
  Administered 2016-07-08: 10 mg via INTRAVENOUS
  Filled 2016-07-08: qty 10

## 2016-07-08 NOTE — ED Triage Notes (Signed)
Patient arrives by EMS with complaints of SOB and dependent edema-uncontrolled AF per EMS with rate 170. Patient currently under po chemo treatment for "no platelets". Patient with congested cough.

## 2016-07-08 NOTE — ED Provider Notes (Signed)
[facility] DEPT Provider Note   CSN: [id number] Arrival date & time: 07/08/16  2015     History   Chief Complaint Chief Complaint  Patient presents with  . Shortness of Breath  . Uncontrolled atrial fib  . Dependent Edema    HPI [name] is a 76 y.o. male.  76 year old male with past rectal history of COPD, ITP, nephrolithiasis, hyperlipidemia, pleural effusions and said to diabetes presents to the emergency department secondary to tachycardia. Patient states that he's been checking his blood pressure heart rate and oxygen levels repeatedly for the last week or so at request of his physician. He has had a history of intermittently fast heart rates but nothing persistent. He noticed around noon this morning after taking his medications first I was a pia that his heart rate went up into the 160s and 170s and persisted throughout the day. He had associated shortness of breath with it where he can't speak full sentences. Over the last couple weeks he had progressively worsening lower extremity swelling as well. He wears 3 L at nasal cannula at home but no changes. He talked to his primary doctor office who sent him here for further evaluation. No history of atrial fibrillation that he knows of. No history of SVT. Not on any neurologic medications or anticoagulant this time.      Past Medical History:  Diagnosis Date  . Adenomatous colon polyp    01/2006  . Chronic pansinusitis   . COPD (chronic obstructive pulmonary disease) ([place])   . Diverticula of colon   . History of ITP   . History of nephrolithiasis   . Hyperlipemia   . Hypertension   . 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 Pleural effusion   . Sinus drainage    Cough with post nasal drip  . Thrombocytopenia ([place])   . Type 2 diabetes mellitus [facility])     Patient Active Problem List   Diagnosis Date Noted  . Bronchiectasis with acute exacerbation ([place])   . 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 Paroxysmal SVT  (supraventricular tachycardia) ([place])   . Pleural effusion 06/10/2016  . Obstructive bronchiectasis ([place]) 05/23/2016  . Acute ITP ([place]) 05/15/2016  . Upper airway cough syndrome 02/17/2016  . SOB (shortness of breath) 02/17/2016  . Cough while on acei and with active sinusitis 02/17/2012  . COPD  GOLD III  02/17/2012  . HBP (high blood pressure) 02/17/2012    Past Surgical History:  Procedure Laterality Date  . COLONOSCOPY    . ESOPHAGOGASTRODUODENOSCOPY    . ESOPHAGOGASTRODUODENOSCOPY  05/19/2011   Procedure: ESOPHAGOGASTRODUODENOSCOPY (EGD);  Surgeon: Wonda Horner, MD;  Location: [facility];  Service: Endoscopy;  Laterality: N/A;  stat CBC  APC also needed  . HOT HEMOSTASIS  05/19/2011   Procedure: HOT HEMOSTASIS (ARGON PLASMA COAGULATION/BICAP);  Surgeon: Wonda Horner, MD;  Location: [facility];  Service: Endoscopy;  Laterality: N/A;  . Spinal cyst resection         Home Medications    Prior to Admission medications   Medication Sig Start Date End Date Taking? Authorizing Provider  albuterol (PROAIR HFA) 108 (90 Base) MCG/ACT inhaler 2 puffs every 4 hours as needed only  if your can't catch your breath 04/04/16   Tanda Rockers, MD  aminocaproic acid (AMICAR) 500 MG tablet Take 4 tablets (2,000 mg total) by mouth every 8 (eight) hours. 06/27/16   Nicholas Lose, MD  bumetanide (BUMEX) 1 MG tablet Take 1 tablet (1 mg  total) by mouth 2 (two) times daily. 06/27/16   Nicholas Lose, MD  chlorpheniramine-HYDROcodone (TUSSIONEX) 10-8 MG/5ML SUER Take 5 mLs by mouth at bedtime as needed for cough. 06/20/16   Clanford Marisa Hua, MD  cyclophosphamide (CYTOXAN) 50 MG tablet Take 3 tablets (150 mg total) by mouth daily. Give on an empty stomach 1 hour before or 2 hours after meals. 07/03/16   Nicholas Lose, MD  dextromethorphan-guaiFENesin (MUCINEX DM) 30-600 MG 12hr tablet Take 1 tablet by mouth 2 (two) times daily as needed for cough.    Historical Provider, MD  ECHINACEA EXTRACT PO Take 1  capsule by mouth daily.    Historical Provider, MD  famotidine (PEPCID) 20 MG tablet One at bedtime 02/02/16   Tanda Rockers, MD  [facility] BILOBA PO Take 1 tablet by mouth daily.     Historical Provider, MD  Glucosamine-Chondroit-Vit C-Mn (GLUCOSAMINE 1500 COMPLEX PO) Take 1,500 mg by mouth 2 (two) times daily.    Historical Provider, MD  Insulin Glargine (TOUJEO SOLOSTAR) 300 UNIT/ML SOPN Inject 30 Units into the skin daily. 06/23/16   Nicholas Lose, MD  loratadine (CLARITIN) 10 MG tablet Take 10 mg by mouth daily.    Historical Provider, MD  losartan (COZAAR) 50 MG tablet Take 1 tablet (50 mg total) by mouth daily. 06/23/16   Nicholas Lose, MD  metFORMIN (GLUCOPHAGE) 1000 MG tablet Take 1,000 mg by mouth 2 (two) times daily with a meal.    Historical Provider, MD  Multiple Vitamins-Minerals (MULTIVITAMIN PO) Take by mouth 1 day or 1 dose.    Historical Provider, MD  pantoprazole (PROTONIX) 40 MG tablet Take 1 tablet (40 mg total) by mouth daily. Take 30-60 min before first meal of the day 05/03/16   Tanda Rockers, MD  predniSONE (DELTASONE) 10 MG tablet Take 1 tablet (10 mg total) by mouth daily with breakfast. 06/23/16   Nicholas Lose, MD  Respiratory Therapy Supplies (FLUTTER) DEVI Use as directed 02/02/16   Tanda Rockers, MD  simvastatin (ZOCOR) 80 MG tablet Take 80 mg by mouth daily.     Historical Provider, MD    Family History Family History  Problem Relation Age of Onset  . Colon cancer Mother   . Heart disease Mother   . Liver cancer Father   . Heart disease Father     Social History Social History  Substance Use Topics  . Smoking status: Former Smoker    Packs/day: 1.00    Years: 42.00    Types: Cigarettes    Start date: 05/02/1959    Quit date: 05/01/2001  . Smokeless tobacco: Never Used  . Alcohol use Yes     Comment: social     Allergies   Patient has no known allergies.   Review of Systems Review of Systems  All other systems reviewed and are negative.    Physical  Exam Updated Vital Signs BP (!) 176/153 (BP Location: Left Arm)   Pulse (!) 169   Temp 98.3 F (36.8 C)   Resp (!) 28   Ht 5\' 11"  (1.803 m)   Wt 201 lb (91.2 kg)   SpO2 100%   BMI 28.03 kg/m   Physical Exam  Constitutional: He is oriented to person, place, and time. He appears well-developed and well-nourished.  HENT:  Head: Normocephalic and atraumatic.  Eyes: Conjunctivae and EOM are normal.  Neck: Normal range of motion.  Cardiovascular: An irregularly irregular rhythm present. Tachycardia present.   Pulmonary/Chest: Effort normal. No respiratory distress.  He has wheezes. He has rales.  Abdominal: Soft. He exhibits no distension.  Musculoskeletal: Normal range of motion. He exhibits edema. He exhibits no tenderness.  Neurological: He is alert and oriented to person, place, and time. No cranial nerve deficit.  Skin: Skin is warm and dry. Rash (lower extremities, appears to be venous insufficiency) noted.  Nursing note and vitals reviewed.    ED Treatments / Results  Labs (all labs ordered are listed, but only abnormal results are displayed) Labs Reviewed  CBC WITH DIFFERENTIAL/PLATELET  COMPREHENSIVE METABOLIC PANEL  BRAIN NATRIURETIC PEPTIDE  TROPONIN I    EKG  EKG Interpretation  Date/Time:  Sunday July 09 2016 06:39:29 EDT Ventricular Rate:  91 PR Interval:    QRS Duration: 84 QT Interval:  333 QTC Calculation: 410 R Axis:   56 Text Interpretation:  Sinus rhythm Borderline T wave abnormalities Confirmed by Betsey Holiday  MD, CHRISTOPHER [phone]) on 07/09/2016 6:44:20 AM       Radiology Dg Chest 2 View  Result Date: 07/08/2016 CLINICAL DATA:  Shortness of breath and atrial fibrillation. EXAM: CHEST  2 VIEW COMPARISON:  06/27/2016 FINDINGS: Moderate bilateral pleural effusions are again noted, slightly increased on the left. Bilateral lower lung atelectasis, pulmonary vascular congestion and probable interstitial edema again noted. There is no evidence of  pneumothorax. No other significant changes identified. IMPRESSION: Pulmonary vascular congestion and probable interstitial edema again noted. Moderate bilateral pleural effusions, slightly increased on the left. Continued bilateral lower lung atelectasis. Electronically Signed   By: Margarette Canada M.D.   On: 07/08/2016 20:11    Procedures .Cardioversion Date/Time: 07/08/2016 10:07 PM Performed by: Merrily Pew Authorized by: Merrily Pew   Consent:    Consent obtained:  Verbal and emergent situation   Consent given by:  Patient   Risks discussed:  Cutaneous burn, death, induced arrhythmia and pain   Alternatives discussed:  Rate-control medication Pre-procedure details:    Cardioversion basis:  Emergent   Rhythm:  Atrial fibrillation   Electrode placement:  Anterior-posterior Attempt one:    Cardioversion mode:  Synchronous   Waveform:  Monophasic   Shock (Joules):  120   Shock outcome:  Conversion to normal sinus rhythm (sinus tachycardia with frequent PACs) Post-procedure details:    Patient status:  Awake   Patient tolerance of procedure:  Tolerated well, no immediate complications .Sedation Date/Time: 07/08/2016 10:09 PM Performed by: Merrily Pew Authorized by: Merrily Pew   Consent:    Consent obtained:  Verbal and emergent situation   Consent given by:  Patient   Risks discussed:  Allergic reaction, prolonged hypoxia resulting in organ damage, nausea and vomiting   Alternatives discussed:  Analgesia without sedation, anxiolysis and regional anesthesia Indications:    Procedure performed:  Cardioversion   Procedure necessitating sedation performed by:  Physician performing sedation   Intended level of sedation:  Moderate (conscious sedation) Pre-sedation assessment:    Time since last food or drink:  2 hours   NPO status caution: urgency dictates proceeding with non-ideal NPO status     ASA classification: class 3 - patient with severe systemic disease     Neck  mobility: normal     Mouth opening:  3 or more finger widths   Thyromental distance:  3 finger widths   Mallampati score:  III - soft palate, base of uvula visible   Pre-sedation assessments completed and reviewed: airway patency, cardiovascular function, hydration status, mental status, nausea/vomiting, pain level, respiratory function and temperature     History  of difficult intubation: no     Pre-sedation assessment completed:  07/08/2016 9:08 PM Immediate pre-procedure details:    Reassessment: Patient reassessed immediately prior to procedure     Reviewed: vital signs and relevant labs/tests     Verified: bag valve mask available, emergency equipment available, intubation equipment available, IV patency confirmed, oxygen available and suction available   Procedure details (see MAR for exact dosages):    Sedation start time:  07/08/2016 9:12 PM   Preoxygenation:  Nasal cannula   Sedation:  Etomidate   Intra-procedure monitoring:  Cardiac monitor, blood pressure monitoring, continuous pulse oximetry, continuous capnometry, frequent LOC assessments and frequent vital sign checks   Intra-procedure events: none     Sedation end time:  07/08/2016 9:43 PM   Total sedation time (minutes):  35 Post-procedure details:    Post-sedation assessment completed:  07/08/2016 9:45 PM   Estimated blood loss (see I/O flowsheets): no     Specimens recovered:  None   Patient is stable for discharge or admission: yes     Patient tolerance:  Tolerated well, no immediate complications   CRITICAL CARE Performed by: Merrily Pew Total critical care time: 60 minutes Critical care time was exclusive of separately billable procedures and treating other patients. Critical care was necessary to treat or prevent imminent or life-threatening deterioration. Critical care was time spent personally by me on the following activities: development of treatment plan with patient and/or surrogate as well as nursing,  discussions with consultants, evaluation of patient's response to treatment, examination of patient, obtaining history from patient or surrogate, ordering and performing treatments and interventions, ordering and review of laboratory studies, ordering and review of radiographic studies, pulse oximetry and re-evaluation of patient's condition.   Medications Ordered in ED Medications  diltiazem (CARDIZEM) 1 mg/mL load via infusion 20 mg (not administered)    And  diltiazem (CARDIZEM) 100 mg in dextrose 5% 131mL (1 mg/mL) infusion (not administered)     Initial Impression / Assessment and Plan / ED Course  I have reviewed the triage vital signs and the nursing notes.  Pertinent labs & imaging results that were available during my care of the patient were reviewed by me and considered in my medical decision making (see chart for details).     Patient here with what initially looked like afib w/ rvr, le edema, bilateral effusions and crackles and became hypotensive c/w unstabl afib so cardioverted as above to sinus tachycardia with frequent PVC's. subseuently returned to afib w/ rvr started on dilt infusion which helped it. Once again went into a fast rate but appeared to be SVT. 2/2 the two episodes of Afib and this appearing to be SVT, his BP was ok I gave a bolus of dilitiazem, calcium to helpkeep BP up and increased infusion. This seemed to be the appropriate combination as patient was having HR around 100-110 sinus with that. 2/2 heart failure in setting of rapid afib, discusssed with cardiology, dr. Radford Pax, who agreed with admission to [facility] and they would consult in AM. Discussed with medicine who will admit.   Final Clinical Impressions(s) / ED Diagnoses   Final diagnoses:  Atrial fibrillation with RVR (HCC)  Pleural effusion  Acute on chronic heart failure, unspecified heart failure type (HCC)  SOB (shortness of breath)  Other specified hypotension    New Prescriptions New  Prescriptions   No medications on file     Merrily Pew, MD 07/09/16 1252

## 2016-07-08 NOTE — ED Notes (Signed)
MD notified of BP

## 2016-07-08 NOTE — ED Notes (Signed)
Patient was given etomidate to sedate him. Patient shocked with 120 joules one time.

## 2016-07-09 ENCOUNTER — Inpatient Hospital Stay (HOSPITAL_COMMUNITY): Payer: Medicare HMO

## 2016-07-09 DIAGNOSIS — J9811 Atelectasis: Secondary | ICD-10-CM | POA: Diagnosis present

## 2016-07-09 DIAGNOSIS — Z9911 Dependence on respirator [ventilator] status: Secondary | ICD-10-CM | POA: Diagnosis not present

## 2016-07-09 DIAGNOSIS — J948 Other specified pleural conditions: Secondary | ICD-10-CM | POA: Diagnosis present

## 2016-07-09 DIAGNOSIS — E782 Mixed hyperlipidemia: Secondary | ICD-10-CM

## 2016-07-09 DIAGNOSIS — J151 Pneumonia due to Pseudomonas: Secondary | ICD-10-CM | POA: Diagnosis not present

## 2016-07-09 DIAGNOSIS — D649 Anemia, unspecified: Secondary | ICD-10-CM | POA: Diagnosis not present

## 2016-07-09 DIAGNOSIS — D693 Immune thrombocytopenic purpura: Secondary | ICD-10-CM | POA: Diagnosis present

## 2016-07-09 DIAGNOSIS — I509 Heart failure, unspecified: Secondary | ICD-10-CM | POA: Diagnosis not present

## 2016-07-09 DIAGNOSIS — N179 Acute kidney failure, unspecified: Secondary | ICD-10-CM | POA: Diagnosis present

## 2016-07-09 DIAGNOSIS — I5033 Acute on chronic diastolic (congestive) heart failure: Secondary | ICD-10-CM | POA: Insufficient documentation

## 2016-07-09 DIAGNOSIS — E785 Hyperlipidemia, unspecified: Secondary | ICD-10-CM | POA: Insufficient documentation

## 2016-07-09 DIAGNOSIS — I480 Paroxysmal atrial fibrillation: Secondary | ICD-10-CM | POA: Diagnosis present

## 2016-07-09 DIAGNOSIS — J449 Chronic obstructive pulmonary disease, unspecified: Secondary | ICD-10-CM | POA: Diagnosis not present

## 2016-07-09 DIAGNOSIS — R0602 Shortness of breath: Secondary | ICD-10-CM | POA: Diagnosis not present

## 2016-07-09 DIAGNOSIS — I5032 Chronic diastolic (congestive) heart failure: Secondary | ICD-10-CM | POA: Diagnosis not present

## 2016-07-09 DIAGNOSIS — Z87891 Personal history of nicotine dependence: Secondary | ICD-10-CM | POA: Diagnosis not present

## 2016-07-09 DIAGNOSIS — I471 Supraventricular tachycardia: Secondary | ICD-10-CM | POA: Insufficient documentation

## 2016-07-09 DIAGNOSIS — J470 Bronchiectasis with acute lower respiratory infection: Secondary | ICD-10-CM | POA: Diagnosis not present

## 2016-07-09 DIAGNOSIS — Z794 Long term (current) use of insulin: Secondary | ICD-10-CM | POA: Diagnosis not present

## 2016-07-09 DIAGNOSIS — D6959 Other secondary thrombocytopenia: Secondary | ICD-10-CM

## 2016-07-09 DIAGNOSIS — E119 Type 2 diabetes mellitus without complications: Secondary | ICD-10-CM | POA: Diagnosis not present

## 2016-07-09 DIAGNOSIS — J471 Bronchiectasis with (acute) exacerbation: Secondary | ICD-10-CM

## 2016-07-09 DIAGNOSIS — J90 Pleural effusion, not elsewhere classified: Secondary | ICD-10-CM | POA: Diagnosis present

## 2016-07-09 DIAGNOSIS — E43 Unspecified severe protein-calorie malnutrition: Secondary | ICD-10-CM | POA: Diagnosis present

## 2016-07-09 DIAGNOSIS — I9589 Other hypotension: Secondary | ICD-10-CM | POA: Diagnosis present

## 2016-07-09 DIAGNOSIS — R29898 Other symptoms and signs involving the musculoskeletal system: Secondary | ICD-10-CM | POA: Diagnosis not present

## 2016-07-09 DIAGNOSIS — I4891 Unspecified atrial fibrillation: Secondary | ICD-10-CM

## 2016-07-09 DIAGNOSIS — R6521 Severe sepsis with septic shock: Secondary | ICD-10-CM | POA: Diagnosis not present

## 2016-07-09 DIAGNOSIS — J869 Pyothorax without fistula: Secondary | ICD-10-CM | POA: Diagnosis not present

## 2016-07-09 DIAGNOSIS — Y95 Nosocomial condition: Secondary | ICD-10-CM | POA: Diagnosis not present

## 2016-07-09 DIAGNOSIS — K219 Gastro-esophageal reflux disease without esophagitis: Secondary | ICD-10-CM | POA: Diagnosis present

## 2016-07-09 DIAGNOSIS — J942 Hemothorax: Secondary | ICD-10-CM | POA: Diagnosis present

## 2016-07-09 DIAGNOSIS — E118 Type 2 diabetes mellitus with unspecified complications: Secondary | ICD-10-CM | POA: Diagnosis not present

## 2016-07-09 DIAGNOSIS — D696 Thrombocytopenia, unspecified: Secondary | ICD-10-CM | POA: Diagnosis not present

## 2016-07-09 DIAGNOSIS — Z9081 Acquired absence of spleen: Secondary | ICD-10-CM | POA: Diagnosis not present

## 2016-07-09 DIAGNOSIS — I110 Hypertensive heart disease with heart failure: Secondary | ICD-10-CM | POA: Diagnosis present

## 2016-07-09 DIAGNOSIS — R571 Hypovolemic shock: Secondary | ICD-10-CM | POA: Diagnosis not present

## 2016-07-09 DIAGNOSIS — Z9889 Other specified postprocedural states: Secondary | ICD-10-CM | POA: Diagnosis not present

## 2016-07-09 DIAGNOSIS — Z79899 Other long term (current) drug therapy: Secondary | ICD-10-CM | POA: Diagnosis not present

## 2016-07-09 DIAGNOSIS — E870 Hyperosmolality and hypernatremia: Secondary | ICD-10-CM | POA: Diagnosis not present

## 2016-07-09 DIAGNOSIS — J440 Chronic obstructive pulmonary disease with acute lower respiratory infection: Secondary | ICD-10-CM | POA: Diagnosis present

## 2016-07-09 DIAGNOSIS — R000 Tachycardia, unspecified: Secondary | ICD-10-CM | POA: Diagnosis not present

## 2016-07-09 DIAGNOSIS — J9621 Acute and chronic respiratory failure with hypoxia: Secondary | ICD-10-CM | POA: Diagnosis not present

## 2016-07-09 DIAGNOSIS — Z800 Family history of malignant neoplasm of digestive organs: Secondary | ICD-10-CM | POA: Diagnosis not present

## 2016-07-09 DIAGNOSIS — G9340 Encephalopathy, unspecified: Secondary | ICD-10-CM | POA: Diagnosis not present

## 2016-07-09 DIAGNOSIS — R579 Shock, unspecified: Secondary | ICD-10-CM | POA: Diagnosis not present

## 2016-07-09 DIAGNOSIS — Z9981 Dependence on supplemental oxygen: Secondary | ICD-10-CM | POA: Diagnosis not present

## 2016-07-09 DIAGNOSIS — D638 Anemia in other chronic diseases classified elsewhere: Secondary | ICD-10-CM | POA: Diagnosis not present

## 2016-07-09 DIAGNOSIS — E873 Alkalosis: Secondary | ICD-10-CM | POA: Diagnosis not present

## 2016-07-09 DIAGNOSIS — J9600 Acute respiratory failure, unspecified whether with hypoxia or hypercapnia: Secondary | ICD-10-CM | POA: Insufficient documentation

## 2016-07-09 DIAGNOSIS — A419 Sepsis, unspecified organism: Secondary | ICD-10-CM | POA: Diagnosis not present

## 2016-07-09 DIAGNOSIS — D62 Acute posthemorrhagic anemia: Secondary | ICD-10-CM | POA: Diagnosis present

## 2016-07-09 DIAGNOSIS — R601 Generalized edema: Secondary | ICD-10-CM

## 2016-07-09 DIAGNOSIS — J9601 Acute respiratory failure with hypoxia: Secondary | ICD-10-CM | POA: Diagnosis not present

## 2016-07-09 DIAGNOSIS — E878 Other disorders of electrolyte and fluid balance, not elsewhere classified: Secondary | ICD-10-CM | POA: Diagnosis not present

## 2016-07-09 DIAGNOSIS — Z23 Encounter for immunization: Secondary | ICD-10-CM | POA: Diagnosis present

## 2016-07-09 LAB — ECHOCARDIOGRAM COMPLETE
CHL CUP DOP CALC LVOT VTI: 23.5 cm
CHL CUP MV DEC (S): 211
CHL CUP STROKE VOLUME: 30 mL
E decel time: 211 msec
E/e' ratio: 12.05
FS: 39 % (ref 28–44)
HEIGHTINCHES: 71 in
IVS/LV PW RATIO, ED: 1.01
LA vol A4C: 30.8 ml
LA vol index: 16.8 mL/m2
LADIAMINDEX: 1.53 cm/m2
LASIZE: 33 mm
LAVOL: 36.2 mL
LDCA: 2.84 cm2
LEFT ATRIUM END SYS DIAM: 33 mm
LV E/e' medial: 12.05
LV E/e'average: 12.05
LV PW d: 12.3 mm — AB (ref 0.6–1.1)
LV TDI E'LATERAL: 7.72
LV sys vol index: 6 mL/m2
LV sys vol: 13 mL — AB (ref 21–61)
LVDIAVOL: 43 mL — AB (ref 62–150)
LVDIAVOLIN: 20 mL/m2
LVELAT: 7.72 cm/s
LVOT diameter: 19 mm
LVOT peak vel: 94 cm/s
LVOTSV: 67 mL
MVPG: 3 mmHg
MVPKAVEL: 76.7 m/s
MVPKEVEL: 93 m/s
Simpson's disk: 69
TDI e' medial: 7.62
WEIGHTICAEL: 3216 [oz_av]

## 2016-07-09 LAB — MRSA PCR SCREENING: MRSA BY PCR: NEGATIVE

## 2016-07-09 LAB — CORTISOL-AM, BLOOD: Cortisol - AM: 9.5 ug/dL (ref 6.7–22.6)

## 2016-07-09 LAB — LIPID PANEL
CHOLESTEROL: 126 mg/dL (ref 0–200)
HDL: 41 mg/dL (ref >40)
LDL Cholesterol: 63 mg/dL (ref 0–99)
Total CHOL/HDL Ratio: 3.1 RATIO
Triglycerides: 110 mg/dL (ref <150)
VLDL: 22 mg/dL (ref 0–40)

## 2016-07-09 LAB — GLUCOSE, CAPILLARY
GLUCOSE-CAPILLARY: 238 mg/dL — AB (ref 65–99)
Glucose-Capillary: 275 mg/dL — ABNORMAL HIGH (ref 65–99)

## 2016-07-09 LAB — TROPONIN I
Troponin I: <0.03 ng/mL (ref <0.03)
Troponin I: <0.03 ng/mL (ref <0.03)

## 2016-07-09 LAB — CBG MONITORING, ED
GLUCOSE-CAPILLARY: 172 mg/dL — AB (ref 65–99)
GLUCOSE-CAPILLARY: 224 mg/dL — AB (ref 65–99)
Glucose-Capillary: 211 mg/dL — ABNORMAL HIGH (ref 65–99)

## 2016-07-09 LAB — TSH: TSH: 5.003 u[IU]/mL — AB (ref 0.350–4.500)

## 2016-07-09 LAB — T4, FREE: Free T4: 1.39 ng/dL — ABNORMAL HIGH (ref 0.61–1.12)

## 2016-07-09 LAB — MAGNESIUM: MAGNESIUM: 1 mg/dL — AB (ref 1.7–2.4)

## 2016-07-09 LAB — PREALBUMIN: Prealbumin: 10.1 mg/dL — ABNORMAL LOW (ref 18–38)

## 2016-07-09 LAB — PROTIME-INR
INR: 1.16
Prothrombin Time: 14.9 seconds (ref 11.4–15.2)

## 2016-07-09 MED ORDER — CYCLOPHOSPHAMIDE 50 MG PO CAPS
75.0000 mg/m2 | ORAL_CAPSULE | Freq: Every day | ORAL | Status: DC
  Administered 2016-07-09 – 2016-07-10 (×2): 150 mg via ORAL
  Filled 2016-07-09 (×2): qty 3

## 2016-07-09 MED ORDER — LEVALBUTEROL HCL 1.25 MG/0.5ML IN NEBU
1.2500 mg | INHALATION_SOLUTION | Freq: Four times a day (QID) | RESPIRATORY_TRACT | Status: DC
  Administered 2016-07-09 (×2): 1.25 mg via RESPIRATORY_TRACT
  Filled 2016-07-09 (×2): qty 0.5

## 2016-07-09 MED ORDER — ECHINACEA EXTRACT 62.5 MG PO CAPS: ORAL_CAPSULE | Freq: Every day | ORAL | Status: DC

## 2016-07-09 MED ORDER — ALPRAZOLAM 0.25 MG PO TABS: 0.2500 mg | ORAL_TABLET | Freq: Two times a day (BID) | ORAL | Status: DC | PRN

## 2016-07-09 MED ORDER — GINKGO BILOBA 50 MG PO CAPS: ORAL_CAPSULE | Freq: Every day | ORAL | Status: DC

## 2016-07-09 MED ORDER — AMIODARONE HCL 200 MG PO TABS
400.0000 mg | ORAL_TABLET | Freq: Two times a day (BID) | ORAL | Status: DC
  Administered 2016-07-09 (×2): 400 mg via ORAL
  Filled 2016-07-09 (×2): qty 2

## 2016-07-09 MED ORDER — LORATADINE 10 MG PO TABS
10.0000 mg | ORAL_TABLET | Freq: Every day | ORAL | Status: DC
  Administered 2016-07-09 – 2016-07-13 (×5): 10 mg via ORAL
  Filled 2016-07-09 (×5): qty 1

## 2016-07-09 MED ORDER — ACETAMINOPHEN 325 MG PO TABS: 650.0000 mg | ORAL_TABLET | ORAL | Status: DC | PRN

## 2016-07-09 MED ORDER — PERFLUTREN LIPID MICROSPHERE
1.0000 mL | INTRAVENOUS | Status: AC | PRN
  Filled 2016-07-09: qty 10

## 2016-07-09 MED ORDER — LEVALBUTEROL HCL 1.25 MG/0.5ML IN NEBU
1.2500 mg | INHALATION_SOLUTION | Freq: Three times a day (TID) | RESPIRATORY_TRACT | Status: DC
  Administered 2016-07-09 – 2016-07-10 (×3): 1.25 mg via RESPIRATORY_TRACT
  Filled 2016-07-09 (×8): qty 0.5

## 2016-07-09 MED ORDER — INSULIN ASPART 100 UNIT/ML ~~LOC~~ SOLN
0.0000 [IU] | Freq: Every day | SUBCUTANEOUS | Status: DC
  Administered 2016-07-09 – 2016-07-11 (×3): 2 [IU] via SUBCUTANEOUS

## 2016-07-09 MED ORDER — AMINOCAPROIC ACID 500 MG PO TABS
2000.0000 mg | ORAL_TABLET | Freq: Three times a day (TID) | ORAL | Status: DC
  Administered 2016-07-09: 1000 mg via ORAL
  Filled 2016-07-09: qty 4

## 2016-07-09 MED ORDER — FUROSEMIDE 10 MG/ML IJ SOLN
40.0000 mg | Freq: Every day | INTRAMUSCULAR | Status: DC
  Administered 2016-07-09 – 2016-07-10 (×2): 40 mg via INTRAVENOUS
  Filled 2016-07-09 (×2): qty 4

## 2016-07-09 MED ORDER — INSULIN ASPART 100 UNIT/ML ~~LOC~~ SOLN
0.0000 [IU] | Freq: Three times a day (TID) | SUBCUTANEOUS | Status: DC
Start: 2016-07-09 — End: 2016-07-13
  Administered 2016-07-09: 5 [IU] via SUBCUTANEOUS
  Administered 2016-07-10: 1 [IU] via SUBCUTANEOUS
  Administered 2016-07-10: 2 [IU] via SUBCUTANEOUS
  Administered 2016-07-10 – 2016-07-11 (×2): 5 [IU] via SUBCUTANEOUS
  Administered 2016-07-11: 1 [IU] via SUBCUTANEOUS
  Administered 2016-07-11: 3 [IU] via SUBCUTANEOUS
  Administered 2016-07-12 (×2): 2 [IU] via SUBCUTANEOUS
  Administered 2016-07-12 – 2016-07-13 (×2): 3 [IU] via SUBCUTANEOUS

## 2016-07-09 MED ORDER — SODIUM CHLORIDE 0.9 % IV SOLN: INTRAVENOUS | Status: DC

## 2016-07-09 MED ORDER — GLUCOSAMINE 1500 COMPLEX PO CAPS: ORAL_CAPSULE | Freq: Two times a day (BID) | ORAL | Status: DC

## 2016-07-09 MED ORDER — CYCLOPHOSPHAMIDE 50 MG PO TABS
75.0000 mg/m2 | ORAL_TABLET | Freq: Every day | ORAL | Status: DC
  Filled 2016-07-09: qty 3

## 2016-07-09 MED ORDER — FAMOTIDINE 20 MG PO TABS: 20.0000 mg | ORAL_TABLET | Freq: Every day | ORAL | Status: DC

## 2016-07-09 MED ORDER — BUDESONIDE 0.25 MG/2ML IN SUSP
0.2500 mg | Freq: Two times a day (BID) | RESPIRATORY_TRACT | Status: DC
  Administered 2016-07-09 – 2016-07-31 (×35): 0.25 mg via RESPIRATORY_TRACT
  Filled 2016-07-09 (×43): qty 2

## 2016-07-09 MED ORDER — POTASSIUM CHLORIDE CRYS ER 20 MEQ PO TBCR
40.0000 meq | EXTENDED_RELEASE_TABLET | ORAL | Status: AC
  Filled 2016-07-09: qty 2

## 2016-07-09 MED ORDER — PANTOPRAZOLE SODIUM 40 MG PO TBEC
40.0000 mg | DELAYED_RELEASE_TABLET | Freq: Every day | ORAL | Status: DC
  Administered 2016-07-09 – 2016-07-13 (×5): 40 mg via ORAL
  Filled 2016-07-09 (×5): qty 1

## 2016-07-09 MED ORDER — HYDROCOD POLST-CPM POLST ER 10-8 MG/5ML PO SUER
5.0000 mL | Freq: Two times a day (BID) | ORAL | Status: DC | PRN
  Administered 2016-07-09: 5 mL via ORAL
  Filled 2016-07-09: qty 5

## 2016-07-09 MED ORDER — ADULT MULTIVITAMIN LIQUID CH
15.0000 mL | Freq: Every day | ORAL | Status: DC
  Administered 2016-07-10 – 2016-07-11 (×2): 15 mL via ORAL
  Filled 2016-07-09 (×5): qty 15

## 2016-07-09 MED ORDER — INSULIN GLARGINE 100 UNIT/ML ~~LOC~~ SOLN: 30.0000 [IU] | Freq: Every day | SUBCUTANEOUS | Status: DC

## 2016-07-09 MED ORDER — IPRATROPIUM BROMIDE 0.02 % IN SOLN
0.5000 mg | Freq: Three times a day (TID) | RESPIRATORY_TRACT | Status: DC
  Administered 2016-07-09 – 2016-07-10 (×3): 0.5 mg via RESPIRATORY_TRACT
  Filled 2016-07-09 (×8): qty 2.5

## 2016-07-09 MED ORDER — ONDANSETRON HCL 4 MG/2ML IJ SOLN
4.0000 mg | Freq: Four times a day (QID) | INTRAMUSCULAR | Status: DC | PRN
  Administered 2016-07-09: 4 mg via INTRAVENOUS
  Filled 2016-07-09: qty 2

## 2016-07-09 MED ORDER — ATORVASTATIN CALCIUM 40 MG PO TABS
40.0000 mg | ORAL_TABLET | Freq: Every day | ORAL | Status: DC
  Administered 2016-07-09 – 2016-07-12 (×4): 40 mg via ORAL
  Filled 2016-07-09 (×5): qty 1

## 2016-07-09 MED ORDER — PREDNISONE 10 MG PO TABS
10.0000 mg | ORAL_TABLET | Freq: Every day | ORAL | Status: DC
  Administered 2016-07-09 – 2016-07-10 (×2): 10 mg via ORAL
  Filled 2016-07-09 (×2): qty 1

## 2016-07-09 MED ORDER — MAGNESIUM SULFATE 4 GM/100ML IV SOLN
4.0000 g | Freq: Once | INTRAVENOUS | Status: AC
  Administered 2016-07-09: 4 g via INTRAVENOUS
  Filled 2016-07-09: qty 100

## 2016-07-09 MED ORDER — ZOLPIDEM TARTRATE 5 MG PO TABS
5.0000 mg | ORAL_TABLET | Freq: Every evening | ORAL | Status: DC | PRN
  Administered 2016-07-10 – 2016-07-12 (×2): 5 mg via ORAL
  Filled 2016-07-09 (×4): qty 1

## 2016-07-09 MED ORDER — INSULIN GLARGINE 100 UNIT/ML ~~LOC~~ SOLN
18.0000 [IU] | Freq: Every day | SUBCUTANEOUS | Status: DC
  Filled 2016-07-09 (×2): qty 0.18

## 2016-07-09 MED ORDER — LEVALBUTEROL HCL 0.63 MG/3ML IN NEBU
0.6300 mg | INHALATION_SOLUTION | Freq: Four times a day (QID) | RESPIRATORY_TRACT | Status: DC | PRN
  Administered 2016-07-13: 0.63 mg via RESPIRATORY_TRACT
  Filled 2016-07-09: qty 3

## 2016-07-09 NOTE — H&P (Addendum)
History and Physical    [name] ION:[id number] DOB: 20-Sep-1940 DOA: 07/08/2016  Referring MD/NP/PA:   PCP: [name], MD   Patient coming from:  The patient is coming from home.  At baseline, pt is independent for most of ADL.  Chief Complaint: Shortness of breath and bilateral leg edema  HPI: [name] is a 76 y.o. male with medical history significant of hypertension, hyperlipidemia, diabetes mellitus, COPD, GERD, idiopathic thrombocytopenia, bilateral pleural effusion, bronchiectasis, PSVT, dCHF, who presents with shortness of breath and bilateral leg edema.  Patient states that he has chronic shortness of breath in the past 4 months, which has worsened since this morning. He noted that his heart rate increased to 160-170 at about 11 AM. He had one episode of low blood pressure 80/63 at about 11:30 AM. Patient does not have chest pain. He has mild cough, but no fever or chills. He has bilateral pleural effusion. Pt states that he had left thoracentesis on 06/14/16, and fluid analysis did not show malignancy or infection per pt. He also reports that his bilateral leg edema has worsened, no calf tenderness. Patient denies nausea, vomiting, diarrhea, abdominal pain, symptoms of UTI or unilateral weakness. Patient had A. fib with RVR and hypotension with Bp 66/54 initially. He underwent cardioversion shock in ED, and converted into sinus rhythm. Blood pressure comes back to 99/58- 111/59.   ED Course: pt was found to have BNP was 113.8, negative troponin, WBC 9.1, platelets less than 5 (his platelet was 53/5/18), mild AKI with Cre 1.23, temperature normal. Oxygen saturation 90% on room air. CXR showed  Pulmonary vascular congestion and probable interstitial edema, moderate bilateral pleural effusions, slightly increased on the left and continued bilateral lower lung atelectasis. Pt is admitted to SDU as inpt.  Review of Systems:   General: no fevers, chills, no changes in body  weight, has poor appetite, has fatigue HEENT: no blurry vision, hearing changes or sore throat Respiratory: has dyspnea, coughing, no wheezing CV: no chest pain, no palpitations GI: no nausea, vomiting, abdominal pain, diarrhea, constipation GU: no dysuria, burning on urination, increased urinary frequency, hematuria  Ext: has leg edema Neuro: no unilateral weakness, numbness, or tingling, no vision change or hearing loss Skin: no rash, no skin tear. MSK: No muscle spasm, no deformity, no limitation of range of movement in spin Heme: No easy bruising.  Travel history: No recent long distant travel.  Allergy: No Known Allergies  Past Medical History:  Diagnosis Date  . Adenomatous colon polyp    01/2006  . Chronic pansinusitis   . COPD (chronic obstructive pulmonary disease) ([place])   . Diverticula of colon   . History of ITP   . History of nephrolithiasis   . Hyperlipemia   . Hypertension   . 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 Pleural effusion   . Sinus drainage    Cough with post nasal drip  . Thrombocytopenia ([place])   . Type 2 diabetes mellitus ([place])     Past Surgical History:  Procedure Laterality Date  . COLONOSCOPY    . ESOPHAGOGASTRODUODENOSCOPY    . ESOPHAGOGASTRODUODENOSCOPY  05/19/2011   Procedure: ESOPHAGOGASTRODUODENOSCOPY (EGD);  Surgeon: Wonda Horner, MD;  Location: [facility];  Service: Endoscopy;  Laterality: N/A;  stat CBC  APC also needed  . HOT HEMOSTASIS  05/19/2011   Procedure: HOT HEMOSTASIS (ARGON PLASMA COAGULATION/BICAP);  Surgeon: Wonda Horner, MD;  Location: [facility];  Service: Endoscopy;  Laterality: N/A;  . Spinal cyst resection  Social History:  reports that he quit smoking about 15 years ago. His smoking use included Cigarettes. He started smoking about 57 years ago. He has a 42.00 pack-year smoking history. He has never used smokeless tobacco. He reports that he drinks alcohol. He reports that he does not use drugs.  Family History:    Family History  Problem Relation Age of Onset  . Colon cancer Mother   . Heart disease Mother   . Liver cancer Father   . Heart disease Father      Prior to Admission medications   Medication Sig Start Date End Date Taking? Authorizing Provider  albuterol (PROAIR HFA) 108 (90 Base) MCG/ACT inhaler 2 puffs every 4 hours as needed only  if your can't catch your breath 04/04/16  Yes Tanda Rockers, MD  aminocaproic acid (AMICAR) 500 MG tablet Take 4 tablets (2,000 mg total) by mouth every 8 (eight) hours. 06/27/16  Yes Nicholas Lose, MD  bumetanide (BUMEX) 1 MG tablet Take 1 tablet (1 mg total) by mouth 2 (two) times daily. 06/27/16  Yes Nicholas Lose, MD  chlorpheniramine-HYDROcodone (TUSSIONEX) 10-8 MG/5ML SUER Take 5 mLs by mouth at bedtime as needed for cough. 06/20/16  Yes Clanford Marisa Hua, MD  cyclophosphamide (CYTOXAN) 50 MG tablet Take 3 tablets (150 mg total) by mouth daily. Give on an empty stomach 1 hour before or 2 hours after meals. 07/03/16  Yes Nicholas Lose, MD  ECHINACEA EXTRACT PO Take 1 capsule by mouth daily.   Yes Historical Provider, MD  famotidine (PEPCID) 20 MG tablet One at bedtime Patient taking differently: Take 20 mg by mouth at bedtime.  02/02/16  Yes Tanda Rockers, MD  fexofenadine (ALLEGRA) 180 MG tablet Take 180 mg by mouth daily.   Yes Historical Provider, MD  GINKGO BILOBA PO Take 1 tablet by mouth daily.    Yes Historical Provider, MD  Glucosamine-Chondroit-Vit C-Mn (GLUCOSAMINE 1500 COMPLEX PO) Take 1,500 mg by mouth 2 (two) times daily.   Yes Historical Provider, MD  Insulin Glargine (TOUJEO SOLOSTAR) 300 UNIT/ML SOPN Inject 30 Units into the skin daily. 06/23/16  Yes Nicholas Lose, MD  metFORMIN (GLUCOPHAGE) 1000 MG tablet Take 1,000 mg by mouth 2 (two) times daily with a meal.   Yes Historical Provider, MD  Multiple Vitamins-Minerals (MULTIVITAMIN PO) Take 1 tablet by mouth daily.    Yes Historical Provider, MD  pantoprazole (PROTONIX) 40 MG tablet Take 1 tablet  (40 mg total) by mouth daily. Take 30-60 min before first meal of the day 05/03/16  Yes Tanda Rockers, MD  predniSONE (DELTASONE) 10 MG tablet Take 1 tablet (10 mg total) by mouth daily with breakfast. 06/23/16  Yes Nicholas Lose, MD  Respiratory Therapy Supplies (FLUTTER) DEVI Use as directed 02/02/16  Yes Tanda Rockers, MD  simvastatin (ZOCOR) 80 MG tablet Take 80 mg by mouth daily.    Yes Historical Provider, MD  losartan (COZAAR) 50 MG tablet Take 1 tablet (50 mg total) by mouth daily. Patient not taking: Reported on 07/08/2016 06/23/16   Nicholas Lose, MD    Physical Exam: Vitals:   07/09/16 0130 07/09/16 0145 07/09/16 0315 07/09/16 0330  BP: 104/60 109/62 103/65 114/64  Pulse: 98 96 93 95  Resp:  26 26 (!) 31  Temp:      SpO2: 100% 99% 96% 98%  Weight:      Height:       General: Not in acute distress HEENT:       Eyes:  PERRL, EOMI, no scleral icterus.       ENT: No discharge from the ears and nose, no pharynx injection, no tonsillar enlargement.        Neck: positive JVD, no bruit, no mass felt. Heme: No neck lymph node enlargement. Cardiac: S1/S2, RRR, No murmurs, No gallops or rubs. Respiratory: decreased air movement bilaterally. Has rhonchi bilaterally. No rales, wheezing or rubs. GI: Soft, nondistended, nontender, no rebound pain, no organomegaly, BS present. GU: No hematuria Ext: 3+ pitting leg edema bilaterally. 2+DP/PT pulse bilaterally. Musculoskeletal: No joint deformities, No joint redness or warmth, no limitation of ROM in spin. Skin: No rashes.  Neuro: Alert, oriented X3, cranial nerves II-XII grossly intact, moves all extremities normally. Psych: Patient is not psychotic, no suicidal or hemocidal ideation.  Labs on Admission: I have personally reviewed following labs and imaging studies  CBC:  Recent Labs Lab 07/03/16 1141 07/08/16 2058  WBC 11.6* 9.1  NEUTROABS 9.4* 6.1  HGB 8.3* 11.2*  HCT 28.1* 34.6*  MCV 91.2 88.3  PLT 5* <5*   Basic Metabolic  Panel:  Recent Labs Lab 07/08/16 2058  NA 134*  K 3.7  CL 85*  CO2 37*  GLUCOSE 206*  BUN 32*  CREATININE 1.23  CALCIUM 8.3*   GFR: Estimated Creatinine Clearance: 60.9 mL/min (by C-G formula based on SCr of 1.23 mg/dL). Liver Function Tests:  Recent Labs Lab 07/08/16 2058  AST 24  ALT 13*  ALKPHOS 71  BILITOT 0.4  PROT 7.0  ALBUMIN 2.7*   No results for input(s): LIPASE, AMYLASE in the last 168 hours. No results for input(s): AMMONIA in the last 168 hours. Coagulation Profile: No results for input(s): INR, PROTIME in the last 168 hours. Cardiac Enzymes:  Recent Labs Lab 07/08/16 2058  TROPONINI <0.03   BNP (last 3 results)  Recent Labs  02/17/16 0948 05/05/16 1450  PROBNP 40.0 17.0   HbA1C: No results for input(s): HGBA1C in the last 72 hours. CBG: No results for input(s): GLUCAP in the last 168 hours. Lipid Profile: No results for input(s): CHOL, HDL, LDLCALC, TRIG, CHOLHDL, LDLDIRECT in the last 72 hours. Thyroid Function Tests: No results for input(s): TSH, T4TOTAL, FREET4, T3FREE, THYROIDAB in the last 72 hours. Anemia Panel: No results for input(s): VITAMINB12, FOLATE, FERRITIN, TIBC, IRON, RETICCTPCT in the last 72 hours. Urine analysis:    Component Value Date/Time   COLORURINE YELLOW 06/10/2016 [place] 06/10/2016 1155   LABSPEC 1.010 06/10/2016 1155   PHURINE 5.0 06/10/2016 1155   GLUCOSEU NEGATIVE 06/10/2016 1155   HGBUR MODERATE (A) 06/10/2016 1155   BILIRUBINUR NEGATIVE 06/10/2016 1155   KETONESUR NEGATIVE 06/10/2016 1155   PROTEINUR NEGATIVE 06/10/2016 1155   UROBILINOGEN 1.0 11/22/2008 1922   NITRITE NEGATIVE 06/10/2016 1155   LEUKOCYTESUR LARGE (A) 06/10/2016 1155   Sepsis Labs: @LABRCNTIP (procalcitonin:4,lacticidven:4) )No results found for this or any previous visit (from the past 240 hour(s)).   Radiological Exams on Admission: Dg Chest 2 View  Result Date: 07/08/2016 CLINICAL DATA:  Shortness of  breath and atrial fibrillation. EXAM: CHEST  2 VIEW COMPARISON:  06/27/2016 FINDINGS: Moderate bilateral pleural effusions are again noted, slightly increased on the left. Bilateral lower lung atelectasis, pulmonary vascular congestion and probable interstitial edema again noted. There is no evidence of pneumothorax. No other significant changes identified. IMPRESSION: Pulmonary vascular congestion and probable interstitial edema again noted. Moderate bilateral pleural effusions, slightly increased on the left. Continued bilateral lower lung atelectasis. Electronically Signed   By: Dellis Filbert  Hu M.D.   On: 07/08/2016 20:11     EKG: Independently reviewed.  Atrial fibrillation with RVR which is converted to sinus rhythm after cardioversion shock in ED.  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 fib with RVR and hypotension with Bp 66/54 initially. He underwent cardioversion shock in ED, and converted into sinus rhythm. Blood pressure comes back to 99/58- 111/59. Currently hemodynamically stable. Still has shortness rest, but no chest pain. Triggering factor is not clear, but differential diagnoses include dCHF exacerbation, hypoxia, cardiac ischemia and thyroid dysfunction. CHA2DS2-VASc Score is 4, needs oral anticoagulation, but pt cannot take AC due to severe ITP. EDP consulted cardiology, Dr. Radford Pax, who recommended to call cardiology again in the morning. - will admit to SDU as inpt - started cardizem  gtt - cycle CE q6 x3  - Risk factor stratification: will check FLP, TSH and A1C  - 2d echo  Acute on chronic respiratory failure with hypoxia: This is likely due to multifactorial etiology, including dCHF exacerbation, COPD, bronchiectasis and bilateral pleural effusion. Pt has 3+ leg edema, elevated BNP 113.8, pulmonary edema on chest x-ray and positive DVT, consistent with CHF exacerbation. Patient does not have productive cough, COPD seems to be stable. His bilateral pleural effusion seems to worsened. -will treat dCHF exacerbation, COPD and pleural effusion as below  Acute on chronic diastolic CHF: pt is no bumetanide at home. Last 2-D echo on 06/11/16 showed EF of 50-60 percent with grade 1 diastolic dysfunction. Now has CHF exacerbation. - will switch oral Bumetanide to IV lasix -f/u 2d echo  COPD  GOLD III : stable -prn Xopenex Nebs  Obstructive bronchiectasis: stable -continue home prednisone 10 mg daily -check cortisol level  Bilateral Pleural effusion: CXR showed moderate bilateral pleural effusions, slightly increased on the left and continued bilateral lower lung atelectasis. Cannot to thoracentesis due to severe thrombocytopenia. -start IV lasix as above  ITP: Platelets less than 5 but no bleeding. Patient follows with Dr. [name], oncology. Received IVIG infusions/ rituxima before.  -Currently on cyclophosphamide -Continue aminocaproic acid -f/u with CBC  DM-II: Last A1c not on record. Patient is taking metformin and glargine insulin and the at home -will decrease Lantus dose from  30-18 units daily -SSI -A1c  HLD: -Continue home medications: Zocor -Check FLP  GERD: -Protonix  HTN: -hold losartan due to soft Bp and AKI  Mild AKI: Likely due to prerenal secondary to dehydration and continuation of losartan, diruetics - Follow up renal function by BMP - Hold loasartan  DVT ppx: SCD Code Status: Full code Family Communication: Yes, patient's wife and daughter  at bed side Disposition Plan:  Anticipate discharge back to previous home environment Consults called:  none Admission status: SDU/inpation       Date of Service 07/09/2016    Dallie Patton, [facility] Pager [phone]  If 7PM-7AM, please contact night-coverage [email or web address] Password [facility] 07/09/2016, 3:48 AM

## 2016-07-09 NOTE — ED Notes (Signed)
Notified RT that pt has nebulizer due. Sts she will be to ED soon

## 2016-07-09 NOTE — Consult Note (Addendum)
CONSULTATION NOTE  Reason for Consult: PAF, hypotension  Requesting Physician: Dr. Wynelle Cleveland    Cardiologist: Dr. McDowell/Dr. Tamala Julian - scheduled to see Dr. Haroldine Laws on 3/12  HPI: This is a 76 y.o. male with a past medical history significant for hypertension, dyslipidemia, type 2 diabetes, COPD, idiopathic thrombocytopenia, bilateral pleural effusions, bronchiectasis and diastolic heart failure with history of PSVT, who presents with leg swelling and progressive shortness of breath.  In February 2018 he was seen in consultation by Dr. Johnny Bridge.  At the time he was noted to have SVT and amiodarone was recommended.  Subsequently he was rounded on by Dr. Daneen Schick who added diltiazem.  Ultimately he weaned down amiodarone and recommended discontinuing it at discharge.  He presented early this morning with acute on chronic dyspnea and was noted to be tachycardic again with heart rate in the 160s-170s yesterday.  EKG per my review demonstrates most likely SVT.  There is some very mild irregularity in the RR interval but I do not think this represents atrial fibrillation. In the ER he was hypotensive with blood pressure of 66/54 initially and he underwent urgent cardioversion for symptomatic A. fib.  This converted him to sinus rhythm with PAC's and period of atrial bigeminy.  Blood pressure accordingly improved up to around 921 systolic.  Initial BNP was 113.  Echo in February 2018 showed EF 19-41%, grade 1 diastolic dysfunction, mild TR, normal biatrial size and a small left pleural effusion.  PSVT was noted during the study briefly at that time. Labs significant for normal creatinine. Platelet count <5, BNP of 113 as mentioned, potassium of 3.7 and magnesium was very low at 1.0. Troponin negative x 2. Cardiology is consulted for management recommendations.  PMHx:  Past Medical History:  Diagnosis Date  . Adenomatous colon polyp    01/2006  . Chronic pansinusitis   . COPD (chronic  obstructive pulmonary disease) ([place])   . Diverticula of colon   . History of ITP   . History of nephrolithiasis   . Hyperlipemia   . Hypertension   . 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 Pleural effusion   . Sinus drainage    Cough with post nasal drip  . Thrombocytopenia ([place])   . Type 2 diabetes mellitus ([place])    Past Surgical History:  Procedure Laterality Date  . COLONOSCOPY    . ESOPHAGOGASTRODUODENOSCOPY    . ESOPHAGOGASTRODUODENOSCOPY  05/19/2011   Procedure: ESOPHAGOGASTRODUODENOSCOPY (EGD);  Surgeon: Wonda Horner, MD;  Location: [facility];  Service: Endoscopy;  Laterality: N/A;  stat CBC  APC also needed  . HOT HEMOSTASIS  05/19/2011   Procedure: HOT HEMOSTASIS (ARGON PLASMA COAGULATION/BICAP);  Surgeon: Wonda Horner, MD;  Location: [facility];  Service: Endoscopy;  Laterality: N/A;  . Spinal cyst resection      FAMHx: Family History  Problem Relation Age of Onset  . Colon cancer Mother   . Heart disease Mother   . Liver cancer Father   . Heart disease Father     SOCHx:  reports that he quit smoking about 15 years ago. His smoking use included Cigarettes. He started smoking about 57 years ago. He has a 42.00 pack-year smoking history. He has never used smokeless tobacco. He reports that he drinks alcohol. He reports that he does not use drugs.  ALLERGIES: No Known Allergies  ROS: Pertinent items noted in HPI and remainder of comprehensive ROS otherwise negative.  HOME MEDICATIONS: No current facility-administered medications on file  prior to encounter.    Current Outpatient Prescriptions on File Prior to Encounter  Medication Sig Dispense Refill  . albuterol (PROAIR HFA) 108 (90 Base) MCG/ACT inhaler 2 puffs every 4 hours as needed only  if your can't catch your breath 1 Inhaler 11  . aminocaproic acid (AMICAR) 500 MG tablet Take 4 tablets (2,000 mg total) by mouth every 8 (eight) hours. 360 tablet 2  . bumetanide (BUMEX) 1 MG tablet Take 1 tablet (1 mg  total) by mouth 2 (two) times daily. 60 tablet 3  . chlorpheniramine-HYDROcodone (TUSSIONEX) 10-8 MG/5ML SUER Take 5 mLs by mouth at bedtime as needed for cough. 115 mL 0  . cyclophosphamide (CYTOXAN) 50 MG tablet Take 3 tablets (150 mg total) by mouth daily. Give on an empty stomach 1 hour before or 2 hours after meals. 90 tablet 1  . ECHINACEA EXTRACT PO Take 1 capsule by mouth daily.    . famotidine (PEPCID) 20 MG tablet One at bedtime (Patient taking differently: Take 20 mg by mouth at bedtime. )    . GINKGO BILOBA PO Take 1 tablet by mouth daily.     . Glucosamine-Chondroit-Vit C-Mn (GLUCOSAMINE 1500 COMPLEX PO) Take 1,500 mg by mouth 2 (two) times daily.    . Insulin Glargine (TOUJEO SOLOSTAR) 300 UNIT/ML SOPN Inject 30 Units into the skin daily. 1 pen   . metFORMIN (GLUCOPHAGE) 1000 MG tablet Take 1,000 mg by mouth 2 (two) times daily with a meal.    . Multiple Vitamins-Minerals (MULTIVITAMIN PO) Take 1 tablet by mouth daily.     . pantoprazole (PROTONIX) 40 MG tablet Take 1 tablet (40 mg total) by mouth daily. Take 30-60 min before first meal of the day 30 tablet 2  . predniSONE (DELTASONE) 10 MG tablet Take 1 tablet (10 mg total) by mouth daily with breakfast. 30 tablet 0  . Respiratory Therapy Supplies (FLUTTER) DEVI Use as directed 1 each 0  . simvastatin (ZOCOR) 80 MG tablet Take 80 mg by mouth daily.     Marland Kitchen losartan (COZAAR) 50 MG tablet Take 1 tablet (50 mg total) by mouth daily. (Patient not taking: Reported on 07/08/2016) 30 tablet 0    HOSPITAL MEDICATIONS: Reviewed  VITALS: Blood pressure 114/57, pulse 87, temperature 98.3 F (36.8 C), resp. rate (!) 29, height '5\' 11"'$  (1.803 m), weight 201 lb (91.2 kg), SpO2 99 %.  PHYSICAL EXAM: General appearance: alert, no distress and pale Neck: no carotid bruit and no JVD Lungs: rhonchi bilaterally Heart: regular rate and rhythm Abdomen: soft, non-tender; bowel sounds normal; no masses,  no organomegaly Extremities: edema 2+  bilateral pitting edema, petechiae noted on the feet Pulses: 2+ and symmetric Skin: Skin color, texture, turgor normal. No rashes or lesions Neurologic: Grossly normal Psych: Frustrated  LABS: Results for orders placed or performed during the hospital encounter of 07/08/16 (from the past 48 hour(s))  CBC with Differential     Status: Abnormal   Collection Time: 07/08/16  8:58 PM  Result Value Ref Range   WBC 9.1 4.0 - 10.5 K/uL   RBC 3.92 (L) 4.22 - 5.81 MIL/uL   Hemoglobin 11.2 (L) 13.0 - 17.0 g/dL   HCT 34.6 (L) 39.0 - 52.0 %   MCV 88.3 78.0 - 100.0 fL   MCH 28.6 26.0 - 34.0 pg   MCHC 32.4 30.0 - 36.0 g/dL   RDW 14.9 11.5 - 15.5 %   Platelets <5 (LL) 150 - 400 K/uL    Comment: CRITICAL RESULT CALLED  TO, READ BACK BY AND VERIFIED WITH: GRAHAM,S RN [phone] COVINGTON,N    Neutrophils Relative % 67 %   Lymphocytes Relative 25 %   Monocytes Relative 7 %   Eosinophils Relative 1 %   Basophils Relative 0 %   Neutro Abs 6.1 1.7 - 7.7 K/uL   Lymphs Abs 2.3 0.7 - 4.0 K/uL   Monocytes Absolute 0.6 0.1 - 1.0 K/uL   Eosinophils Absolute 0.1 0.0 - 0.7 K/uL   Basophils Absolute 0.0 0.0 - 0.1 K/uL   RBC Morphology POLYCHROMASIA PRESENT    WBC Morphology SPECIMEN CHECKED FOR CLOTS    Smear Review PLATELET COUNT CONFIRMED BY SMEAR   Comprehensive metabolic panel     Status: Abnormal   Collection Time: 07/08/16  8:58 PM  Result Value Ref Range   Sodium 134 (L) 135 - 145 mmol/L   Potassium 3.7 3.5 - 5.1 mmol/L   Chloride 85 (L) 101 - 111 mmol/L   CO2 37 (H) 22 - 32 mmol/L   Glucose, Bld 206 (H) 65 - 99 mg/dL   BUN 32 (H) 6 - 20 mg/dL   Creatinine, Ser 1.23 0.61 - 1.24 mg/dL   Calcium 8.3 (L) 8.9 - 10.3 mg/dL   Total Protein 7.0 6.5 - 8.1 g/dL   Albumin 2.7 (L) 3.5 - 5.0 g/dL   AST 24 15 - 41 U/L   ALT 13 (L) 17 - 63 U/L   Alkaline Phosphatase 71 38 - 126 U/L   Total Bilirubin 0.4 0.3 - 1.2 mg/dL   GFR calc non Af Amer 56 (L) >60 mL/min   GFR calc Af Amer >60 >60 mL/min     Comment: (NOTE) The eGFR has been calculated using the CKD EPI equation. This calculation has not been validated in all clinical situations. eGFR's persistently <60 mL/min signify possible Chronic Kidney Disease.    Anion gap 12 5 - 15  Troponin I     Status: None   Collection Time: 07/08/16  8:58 PM  Result Value Ref Range   Troponin I <0.03 <0.03 ng/mL  Brain natriuretic peptide     Status: Abnormal   Collection Time: 07/08/16 11:08 PM  Result Value Ref Range   B Natriuretic Peptide 113.8 (H) 0.0 - 100.0 pg/mL  POC CBG, ED     Status: Abnormal   Collection Time: 07/09/16  4:17 AM  Result Value Ref Range   Glucose-Capillary 172 (H) 65 - 99 mg/dL   Comment 1 Notify RN   Cortisol-am, blood     Status: None   Collection Time: 07/09/16  5:25 AM  Result Value Ref Range   Cortisol - AM 9.5 6.7 - 22.6 ug/dL    Comment: Performed at [facility], [place] [address], [place],  [address]  TSH     Status: Abnormal   Collection Time: 07/09/16  5:26 AM  Result Value Ref Range   TSH 5.003 (H) 0.350 - 4.500 uIU/mL    Comment: Performed by a 3rd Generation assay with a functional sensitivity of <=0.01 uIU/mL.  Lipid panel     Status: None   Collection Time: 07/09/16  5:26 AM  Result Value Ref Range   Cholesterol 126 0 - 200 mg/dL   Triglycerides 110 <150 mg/dL   HDL 41 >40 mg/dL   Total CHOL/HDL Ratio 3.1 RATIO   VLDL 22 0 - 40 mg/dL   LDL Cholesterol 63 0 - 99 mg/dL    Comment:  Total Cholesterol/HDL:CHD Risk Coronary Heart Disease Risk Table                     Men   Women  1/2 Average Risk   3.4   3.3  Average Risk       5.0   4.4  2 X Average Risk   9.6   7.1  3 X Average Risk  23.4   11.0        Use the calculated Patient Ratio above and the CHD Risk Table to determine the patient's CHD Risk.        ATP III CLASSIFICATION (LDL):  <100     mg/dL   Optimal  100-129  mg/dL   Near or Above                    Optimal  130-159  mg/dL   Borderline  160-189   mg/dL   High  >190     mg/dL   Very High Performed at [facility] [address], [place], [place] [address]   Magnesium     Status: Abnormal   Collection Time: 07/09/16  5:28 AM  Result Value Ref Range   Magnesium 1.0 (L) 1.7 - 2.4 mg/dL  Troponin I (q 6hr x 3)     Status: None   Collection Time: 07/09/16  5:35 AM  Result Value Ref Range   Troponin I <0.03 <0.03 ng/mL  Protime-INR     Status: None   Collection Time: 07/09/16  5:36 AM  Result Value Ref Range   Prothrombin Time 14.9 11.4 - 15.2 seconds   INR 1.16   CBG monitoring, ED     Status: Abnormal   Collection Time: 07/09/16  8:22 AM  Result Value Ref Range   Glucose-Capillary 211 (H) 65 - 99 mg/dL    IMAGING: Dg Chest 2 View  Result Date: 07/08/2016 CLINICAL DATA:  Shortness of breath and atrial fibrillation. EXAM: CHEST  2 VIEW COMPARISON:  06/27/2016 FINDINGS: Moderate bilateral pleural effusions are again noted, slightly increased on the left. Bilateral lower lung atelectasis, pulmonary vascular congestion and probable interstitial edema again noted. There is no evidence of pneumothorax. No other significant changes identified. IMPRESSION: Pulmonary vascular congestion and probable interstitial edema again noted. Moderate bilateral pleural effusions, slightly increased on the left. Continued bilateral lower lung atelectasis. Electronically Signed   By: Margarette Canada M.D.   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 Symptomatic recurrent SVT 2. Severe hypomagnesemia 3. Recurrent pleural effusion and leg edema, ?third spacing (Albumin 2.7) 4. Unlikely diastolic CHF  RECOMMENDATION: 1. Mr. Courtwright continues to have recurrent symptomatic SVT.  Although there was some irregularity to a portion of 1 of his EKGs, and general there is a regular narrow complex tachycardia which is more consistent with SVT.  This is important as I do not believe he is at increased stroke risk.  Regardless, he cannot  be anticoagulated due to his undetectable platelet count.  Based on his medical therapy to prevent recurrent SVT is warranted.  He seems to have responded well to amiodarone in February but it was weaned off.  I would like to restart amiodarone loading.  I would wean down diltiazem as tolerated.  I do not detect this to be diastolic congestive heart failure.  His EF in February was normal and there is only mild diastolic dysfunction with normal atrial size.  BNP was essentially normal.  This is out of proportion from his physical exam findings indicating recurrent pleural effusion and significant lower extremity edema.  Laboratory work does indicate a mildly low albumin and he was noted to be hypomagnesemic.  This will need to be repleted to magnesium greater than 2.0 and I would try to get potassium greater than 4.0.  It would be reasonable to check pre-albumin and urine 24-hour protein collection. Will also add SPEP/UPEP to look for multiple myeloma or MGUS. Temporarily, his pulmonary and peripheral edema, thrombocytopenia and bronchiectasis are all related and new developments over the past 4 months.  I do believe these tie together somehow.  He is currently clinically stable and I do not feel needs to be transferred to [facility] as there is not further workup there that I think will add to his diagnosis from a cardiac standpoint.  Thanks for consulting cardiology.  We will follow with  you.  Time Spent Directly with Patient: 45 minutes  Pixie Casino, MD, [facility] Attending Cardiologist [place] 07/09/2016, 9:45 AM

## 2016-07-09 NOTE — ED Notes (Addendum)
ECHO at bedside.

## 2016-07-09 NOTE — Progress Notes (Signed)
*  PRELIMINARY RESULTS* Echocardiogram 2D Echocardiogram has been performed with Definity.  Samuel Germany 07/09/2016, 1:10 PM

## 2016-07-09 NOTE — Progress Notes (Signed)
PHARMACIST - PHYSICIAN ORDER COMMUNICATION  CONCERNING: P&T Medication Policy on Herbal Medications  DESCRIPTION:  This patient's order for:  Echinacea, Gingko and Glucosamine  has been noted.  This product(s) is classified as an "herbal" or natural product. Due to a lack of definitive safety studies or FDA approval, nonstandard manufacturing practices, plus the potential risk of unknown drug-drug interactions while on inpatient medications, the Pharmacy and Therapeutics Committee does not permit the use of "herbal" or natural products of this type within [facility].   ACTION TAKEN: The pharmacy department is unable to verify this order at this time and your patient has been informed of this safety policy. Please reevaluate patient's clinical condition at discharge and address if the herbal or natural product(s) should be resumed at that time.   Leone Haven, PharmD

## 2016-07-09 NOTE — ED Notes (Signed)
Dr Earlie Server at bedside

## 2016-07-09 NOTE — ED Notes (Signed)
24hr Urine collection kit at bedside. Patient and visitors have been educated on how to collect and store urine for lab.

## 2016-07-09 NOTE — Progress Notes (Signed)
Subjective: The patient is seen and examined today. He is a pleasant 76 years old white male with history of idiopathic thrombocytopenic purpura status post several treatments in the past including steroids,IVIG as well as Rituxan and he is currently on Cytoxan. His platelets count has been on was in the range of less than 5000. He called last night complaining of heart rate of 170 and shortness of breath. He was advised to go to the emergency room for evaluation. He was found to have atrial fibrillation with RVR and chest x-ray showed bilateral pleural effusion. CBC today showed persistent low platelets count of 5000. Status post consulted to evaluate the patient for his thrombocytopenia.  Objective: Vital signs in last 24 hours: Temp:  [98.3 F (36.8 C)] 98.3 F (36.8 C) (03/10 2034) Pulse Rate:  [46-180] 87 (03/11 0800) Resp:  [14-42] 29 (03/11 0800) BP: (66-176)/(54-153) 114/57 (03/11 0800) SpO2:  [90 %-100 %] 99 % (03/11 0800) Weight:  [201 lb (91.2 kg)] 201 lb (91.2 kg) (03/10 2035)  Intake/Output from previous day: 03/10 0701 - 03/11 0700 In: 110 [IV Piggyback:110] Out: -  Intake/Output this shift: Total I/O In: -  Out: 450 [Urine:450]  General appearance: alert, cooperative, fatigued and no distress Resp: diminished breath sounds bilaterally and dullness to percussion bilaterally Cardio: tachycardic GI: soft, non-tender; bowel sounds normal; no masses,  no organomegaly Extremities: edema 1+  Lab Results:   Recent Labs  07/08/16 2058  WBC 9.1  HGB 11.2*  HCT 34.6*  PLT <5*   BMET  Recent Labs  07/08/16 2058  NA 134*  K 3.7  CL 85*  CO2 37*  GLUCOSE 206*  BUN 32*  CREATININE 1.23  CALCIUM 8.3*    Studies/Results: Dg Chest 2 View  Result Date: 07/08/2016 CLINICAL DATA:  Shortness of breath and atrial fibrillation. EXAM: CHEST  2 VIEW COMPARISON:  06/27/2016 FINDINGS: Moderate bilateral pleural effusions are again noted, slightly increased on the left.  Bilateral lower lung atelectasis, pulmonary vascular congestion and probable interstitial edema again noted. There is no evidence of pneumothorax. No other significant changes identified. IMPRESSION: Pulmonary vascular congestion and probable interstitial edema again noted. Moderate bilateral pleural effusions, slightly increased on the left. Continued bilateral lower lung atelectasis. Electronically Signed   By: Margarette Canada M.D.   On: 07/08/2016 20:11    Medications: I have reviewed the patient's current medications.  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 His platelets count continues to be very low. He is on low-dose prednisone and I recommended for him to increase the dose of prednisone to 1 mg/kg daily during this admission.Dr. Sonny Dandy willl see the patient during this admission for any further recommendation. 2) atrial fibrillation with rapid ventricular response: The patient is expected to be transferred to [facility] for evaluation and management by cardiology. 3) bilateral pleural effusion: left more than right, probably secondary to congestive heart failure.I don't see a need for thoracentesis at this point but if needed the patient may require platelet transfusion before the procedure. Thank you for taking good care of Mr. Macchia, we will continue to follow up the patient with you and assist in his management on as-needed basis.  LOS: 0 days    Kaitlynd Phillips K. 07/09/2016

## 2016-07-09 NOTE — ED Notes (Signed)
Pt resting quietly at this time with even, unlabored resp. Pt in NAD and VS on monitor are WNL. Will continue to monitor

## 2016-07-09 NOTE — Progress Notes (Addendum)
[facility]   Patient admitted by my colleague this AM. I have examined him and reviewed the chart in detail.   76 y/o with ITP on treatment by Dr Nadara Eaton, severe COPD on 3 L O2 and 10 mg Prednisone, HTN, DM on insluin, admitted 2/10-2/20 with a large pleural effusion needing thoracentesis, SVT and diuresed with improvement in symptoms. Evaluted by cardiology, Pulmonary and Oncology during that visit.   Returns for HR > 150 noted while he was checking Vitals at home. ER performed electrical cardioverstion due to concomitant complaint of dyspnea, then placed him on a Cardizem infusion.   On Exam: HR is NSR in 80-90s, pulse ox 95% on 3 L, no complaints of dyspnea  Principal Problem:  SVT - cont Cardizem infusion for now- stop Amicar - was on Amio at one point during last admission but this was stopped- he does have significant COPD per pulm notes -add on Mg to AM labs- give a dose of Kdur to get k > 4 - THS elevated- likely sick euthyroid syndrome but will check F T4 -cardiology consulted  Active Problems: B/l pleural effusions, dCHF- chronic - effusions were present on last admission as well and are not new - on Bumex at home - has not been taking his Losartan due to low BP - was due to see CHF clinic tomorrow - placed on IV Lasix here- not candidate for thoracentesis due to platelets < 5 - cardiology consulted as mentioned    ITP  - reviewed Dr Geralyn Flash notes - hold Amicar - cont Cyclophosphamide - have consulted Dr Julien Nordmann    Diabetes mellitus without complication  - cont Lantus and SSI    COPD  GOLD III with bronchiectasis - has been using Proair, 10 mg Prednisone daily, Tussionex at bedtime and 3 L O2 continuously - will use Xopenex in hospital PRN only due to SVT - add Pulmicort to control congestion- cont Atovent nebs   Have requested a bed at [facility]. Discussed plan with patient.  Debbe Odea, MD Pager: [email or web address]   Addendum: - Dr Debara Pickett has evaluated  the patient and does not feel he is having acute CHF exacerbation and feels it is reasonable for him to stay at [facility] and continue to receive cardiology care here. He has started Amiodarone with plans to stop Cardizem.   2 D ECHO has been performed: Impressions:  - Overall limited images. Mild LVH with LVEF 60-65%. Probable grade   2 diastolic dysfunction. Calcified mitral annulus with trivial   mitral regurgitation. Aortic valve is moderately calcified and   not well seen. Right ventricle appears mildly dilated. Trivial   tricuspid regurgitation. Right pleural effusion is evident.

## 2016-07-10 ENCOUNTER — Ambulatory Visit: Payer: Medicare HMO | Admitting: Hematology and Oncology

## 2016-07-10 ENCOUNTER — Other Ambulatory Visit: Payer: Medicare HMO

## 2016-07-10 ENCOUNTER — Encounter (HOSPITAL_COMMUNITY): Payer: Medicare HMO | Admitting: Internal Medicine

## 2016-07-10 ENCOUNTER — Encounter (HOSPITAL_COMMUNITY): Payer: Self-pay | Admitting: Hematology and Oncology

## 2016-07-10 DIAGNOSIS — R000 Tachycardia, unspecified: Secondary | ICD-10-CM

## 2016-07-10 DIAGNOSIS — I4891 Unspecified atrial fibrillation: Secondary | ICD-10-CM | POA: Insufficient documentation

## 2016-07-10 DIAGNOSIS — D649 Anemia, unspecified: Secondary | ICD-10-CM

## 2016-07-10 DIAGNOSIS — J449 Chronic obstructive pulmonary disease, unspecified: Secondary | ICD-10-CM

## 2016-07-10 LAB — GLUCOSE, CAPILLARY
GLUCOSE-CAPILLARY: 297 mg/dL — AB (ref 65–99)
GLUCOSE-CAPILLARY: 142 mg/dL — AB (ref 65–99)
GLUCOSE-CAPILLARY: 230 mg/dL — AB (ref 65–99)
Glucose-Capillary: 195 mg/dL — ABNORMAL HIGH (ref 65–99)

## 2016-07-10 LAB — CBC
HCT: 23.3 % — ABNORMAL LOW (ref 39.0–52.0)
HEMATOCRIT: 23.5 % — AB (ref 39.0–52.0)
HEMOGLOBIN: 7.1 g/dL — AB (ref 13.0–17.0)
Hemoglobin: 7 g/dL — ABNORMAL LOW (ref 13.0–17.0)
MCH: 27.1 pg (ref 26.0–34.0)
MCH: 27.4 pg (ref 26.0–34.0)
MCHC: 30 g/dL (ref 30.0–36.0)
MCHC: 30.2 g/dL (ref 30.0–36.0)
MCV: 90.3 fL (ref 78.0–100.0)
MCV: 90.7 fL (ref 78.0–100.0)
Platelets: <5 10*3/uL — CL (ref 150–400)
RBC: 2.58 MIL/uL — AB (ref 4.22–5.81)
RBC: 2.59 MIL/uL — ABNORMAL LOW (ref 4.22–5.81)
RDW: 15 % (ref 11.5–15.5)
RDW: 15 % (ref 11.5–15.5)
WBC: 10.2 10*3/uL (ref 4.0–10.5)
WBC: 9.8 10*3/uL (ref 4.0–10.5)

## 2016-07-10 LAB — BASIC METABOLIC PANEL
ANION GAP: 7 (ref 5–15)
BUN: 25 mg/dL — ABNORMAL HIGH (ref 6–20)
CALCIUM: 8.1 mg/dL — AB (ref 8.9–10.3)
CO2: 40 mmol/L — ABNORMAL HIGH (ref 22–32)
CREATININE: 0.85 mg/dL (ref 0.61–1.24)
Chloride: 88 mmol/L — ABNORMAL LOW (ref 101–111)
Glucose, Bld: 192 mg/dL — ABNORMAL HIGH (ref 65–99)
Potassium: 4.2 mmol/L (ref 3.5–5.1)
SODIUM: 135 mmol/L (ref 135–145)

## 2016-07-10 LAB — HEMOGLOBIN AND HEMATOCRIT, BLOOD
HEMATOCRIT: 27.9 % — AB (ref 39.0–52.0)
Hemoglobin: 8.3 g/dL — ABNORMAL LOW (ref 13.0–17.0)

## 2016-07-10 LAB — MAGNESIUM: Magnesium: 1.7 mg/dL (ref 1.7–2.4)

## 2016-07-10 LAB — HEMOGLOBIN A1C
HEMOGLOBIN A1C: 6.6 % — AB (ref 4.8–5.6)
Mean Plasma Glucose: 143 mg/dL

## 2016-07-10 LAB — CREATININE, URINE, RANDOM: CREATININE, URINE: 55.41 mg/dL

## 2016-07-10 LAB — PREPARE RBC (CROSSMATCH)

## 2016-07-10 MED ORDER — SODIUM CHLORIDE 0.9 % IV SOLN
Freq: Once | INTRAVENOUS | Status: AC
  Administered 2016-07-13: 13:00:00 via INTRAVENOUS

## 2016-07-10 MED ORDER — AMIODARONE HCL IN DEXTROSE 360-4.14 MG/200ML-% IV SOLN
30.0000 mg/h | INTRAVENOUS | Status: DC
  Administered 2016-07-10 – 2016-07-19 (×18): 30 mg/h via INTRAVENOUS
  Filled 2016-07-10 (×19): qty 200

## 2016-07-10 MED ORDER — PREDNISONE 5 MG PO TABS
5.0000 mg | ORAL_TABLET | Freq: Every day | ORAL | Status: DC
  Administered 2016-07-11 – 2016-07-13 (×3): 5 mg via ORAL
  Filled 2016-07-10 (×3): qty 1

## 2016-07-10 MED ORDER — FUROSEMIDE 10 MG/ML IJ SOLN
40.0000 mg | Freq: Two times a day (BID) | INTRAMUSCULAR | Status: DC
  Administered 2016-07-10: 40 mg via INTRAVENOUS
  Filled 2016-07-10: qty 4

## 2016-07-10 MED ORDER — AMINOCAPROIC ACID 500 MG PO TABS
1.0000 g | ORAL_TABLET | Freq: Three times a day (TID) | ORAL | Status: DC
  Administered 2016-07-10 – 2016-07-12 (×6): 1 g via ORAL
  Filled 2016-07-10 (×7): qty 2

## 2016-07-10 MED ORDER — INSULIN GLARGINE 100 UNIT/ML ~~LOC~~ SOLN
22.0000 [IU] | Freq: Every day | SUBCUTANEOUS | Status: DC
  Administered 2016-07-10: 8 [IU] via SUBCUTANEOUS
  Filled 2016-07-10 (×4): qty 0.22

## 2016-07-10 MED ORDER — AMIODARONE HCL IN DEXTROSE 360-4.14 MG/200ML-% IV SOLN
60.0000 mg/h | INTRAVENOUS | Status: DC
  Administered 2016-07-10: 60 mg/h via INTRAVENOUS
  Filled 2016-07-10: qty 200

## 2016-07-10 MED ORDER — AMIODARONE IV BOLUS ONLY 150 MG/100ML
150.0000 mg | Freq: Once | INTRAVENOUS | Status: AC
  Administered 2016-07-10: 150 mg via INTRAVENOUS
  Filled 2016-07-10: qty 100

## 2016-07-10 MED ORDER — OXYMETAZOLINE HCL 0.05 % NA SOLN
2.0000 | Freq: Two times a day (BID) | NASAL | Status: DC | PRN
  Administered 2016-07-10 – 2016-07-14 (×2): 2 via NASAL
  Filled 2016-07-10: qty 15

## 2016-07-10 MED ORDER — AMIODARONE LOAD VIA INFUSION
150.0000 mg | Freq: Once | INTRAVENOUS | Status: DC
  Filled 2016-07-10: qty 83.34

## 2016-07-10 NOTE — Progress Notes (Signed)
Progress Note  Patient Name: [name] Date of Encounter: 07/10/2016  Primary Cardiologist: Dr Cyril Mourning  Subjective   Pt back in AF with RVR, tolerating well. His main complaint is persistent nosebleed  Inpatient Medications    Scheduled Meds: . sodium chloride   Intravenous Once  . amiodarone  150 mg Intravenous Once  . amiodarone  150 mg Intravenous Once  . atorvastatin  40 mg Oral q1800  . budesonide (PULMICORT) nebulizer solution  0.25 mg Nebulization BID  . cyclophosphamide  75 mg/m2 Oral Daily  . furosemide  40 mg Intravenous Daily  . insulin aspart  0-5 Units Subcutaneous QHS  . insulin aspart  0-9 Units Subcutaneous TID WC  . insulin glargine  22 Units Subcutaneous Daily  . ipratropium  0.5 mg Nebulization TID  . levalbuterol  1.25 mg Nebulization TID  . loratadine  10 mg Oral Daily  . multivitamin  15 mL Oral Daily  . pantoprazole  40 mg Oral Daily  . predniSONE  10 mg Oral Q breakfast   Continuous Infusions: . amiodarone     Followed by  . amiodarone    . diltiazem (CARDIZEM) infusion 15 mg/hr (07/10/16 0805)   PRN Meds: acetaminophen, ALPRAZolam, chlorpheniramine-HYDROcodone, levalbuterol, ondansetron (ZOFRAN) IV, oxymetazoline, zolpidem   Vital Signs    Vitals:   07/10/16 0500 07/10/16 0600 07/10/16 0700 07/10/16 0803  BP: (!) 97/50 111/60 (!) 106/43   Pulse: 86 (!) 160 88   Resp: 18 (!) 24 (!) 25   Temp:    98.6 F (37 C)  TempSrc:    Oral  SpO2: 99% 96% 95%   Weight: 197 lb 8.5 oz (89.6 kg)     Height:        Intake/Output Summary (Last 24 hours) at 07/10/16 0908 Last data filed at 07/10/16 0518  Gross per 24 hour  Intake              400 ml  Output              800 ml  Net             -400 ml   Filed Weights   07/08/16 2035 07/09/16 1600 07/10/16 0500  Weight: 201 lb (91.2 kg) 195 lb 1.7 oz (88.5 kg) 197 lb 8.5 oz (89.6 kg)    Telemetry    AF with RVR-140-160 - Personally Reviewed  ECG     AF with RVR vs PSVT-  Personally Reviewed  Physical Exam   GEN: No acute distress.  Pale, active nose bleed Neck: No JVD Cardiac: irregularly irregular rhythm with increased VR Respiratory: decreased breath sounds, no wheezing GI: Soft, nontender, non-distended  MS: No edema; No deformity. Neuro:  Nonfocal  Psych: Normal affect   Labs    Chemistry Recent Labs Lab 07/08/16 2058 07/10/16 0357  NA 134* 135  K 3.7 4.2  CL 85* 88*  CO2 37* 40*  GLUCOSE 206* 192*  BUN 32* 25*  CREATININE 1.23 0.85  CALCIUM 8.3* 8.1*  PROT 7.0  --   ALBUMIN 2.7*  --   AST 24  --   ALT 13*  --   ALKPHOS 71  --   BILITOT 0.4  --   GFRNONAA 56* >60  GFRAA >60 >60  ANIONGAP 12 7     Hematology Recent Labs Lab 07/08/16 2058 07/10/16 0357 07/10/16 0753  WBC 9.1 10.2 9.8  RBC 3.92* 2.58* 2.59*  HGB 11.2* 7.0* 7.1*  HCT 34.6* 23.3* 23.5*  MCV 88.3 90.3 90.7  MCH 28.6 27.1 27.4  MCHC 32.4 30.0 30.2  RDW 14.9 15.0 15.0  PLT <5* <5* <5*    Cardiac Enzymes Recent Labs Lab 07/08/16 2058 07/09/16 0535 07/09/16 1722  TROPONINI <0.03 <0.03 <0.03   No results for input(s): TROPIPOC in the last 168 hours.   BNP Recent Labs Lab 07/08/16 2308  BNP 113.8*     DDimer No results for input(s): DDIMER in the last 168 hours.   Radiology    Dg Chest 2 View  Result Date: 07/08/2016 CLINICAL DATA:  Shortness of breath and atrial fibrillation. EXAM: CHEST  2 VIEW COMPARISON:  06/27/2016 FINDINGS: Moderate bilateral pleural effusions are again noted, slightly increased on the left. Bilateral lower lung atelectasis, pulmonary vascular congestion and probable interstitial edema again noted. There is no evidence of pneumothorax. No other significant changes identified. IMPRESSION: Pulmonary vascular congestion and probable interstitial edema again noted. Moderate bilateral pleural effusions, slightly increased on the left. Continued bilateral lower lung atelectasis. Electronically Signed   By: Margarette Canada M.D.   On:  07/08/2016 20:11    Cardiac Studies   Echo 07/09/16- Study Conclusions  - Left ventricle: The cavity size was normal. Wall thickness was   increased in a pattern of mild LVH. Systolic function was normal.   The estimated ejection fraction was in the range of 60% to 65%.   Although no diagnostic regional wall motion abnormality was   identified, this possibility cannot be completely excluded on the   basis of this study. Features are consistent with a pseudonormal   left ventricular filling pattern, with concomitant abnormal   relaxation and increased filling pressure (grade 2 diastolic   dysfunction). - Mitral valve: Calcified annulus. Mildly calcified leaflets .   There was trivial regurgitation. - Right ventricle: The cavity size was mildly dilated. - Right atrium: Central venous pressure (est): 3 mm Hg. - Atrial septum: No defect or patent foramen ovale was identified. - Tricuspid valve: There was trivial regurgitation. - Pericardium, extracardiac: There was a right pleural effusion.  Impressions:  - Overall limited images. Mild LVH with LVEF 60-65%. Probable grade   2 diastolic dysfunction. Calcified mitral annulus with trivial   mitral regurgitation. Aortic valve is moderately calcified and   not well seen. Right ventricle appears mildly dilated. Trivial   tricuspid regurgitation. Right pleural effusion is evident.  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 Since admission PO Amiodarone added (this worked in Feb 2018) as well as IV Diltiazem. Not a candidate for anticoagulation with undetectable PTLs and active bleeding.   Assessment & Plan    1. AF with RVR vs PSVT- Telemetry looks like AF with RVR this morning. Fortunately he is tolerating this well. He is on PO Amiodar[employer] developer of IV Amiodarone).   2. Thrombocytopenia with active bleeding- Pt  being followed by Hematology- streroids increased  3. Blood loss anemia- Hgb drop from 11.2 to 7. Transfusion ordered.   4. Pleural effusions- Recurrent pleural effusions this admission  Plan- The pt has uncontrolled AF and is at high risk for decompensation. I have changed PO Amiodarone to IV Amiodarone with loading.    Signed, Kerin Ransom, PA-C  07/10/2016, 9:08 AM    Patient examined chart reviewed spent over 35 minutes direct patient care and counseling family on issues related To diastolic failure, pleural effusions and atrial  fibrillation. Converted to NSR rate 90's on iv amiodarone. Would continue Don't think pleural effusions related to "CHF" ideally would benefit from pleur x catheter but cannot perform due to ow platelets See no reason why he cant use amicar to help with nose bleeds. Personally reviewed echo from yesterday and EF 60-65%  Possible grade 2 diastolic dysfunction Increase lasix to iv bid especially since he is getting transfused.   Jenkins Rouge

## 2016-07-10 NOTE — Progress Notes (Addendum)
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 Evaluted by cardiology, Pulmonary and Oncology during that visit.   Returns for HR > 150 noted while he was checking Vitals at home. ER performed electrical cardioverstion due to concomitant complaint of dyspnea, then placed him on a Cardizem infusion.    Subjective: Nose bleed continues. Eating and drinking well. No BM in 2 days. No palpitations or dyspnea.   Assessment & Plan:   Principal Problem:  SVT  At home and in ER Paroxysmal A-fib with RVR subsequently - occurred during Feb admission as well - s/p electrical cardioversion in ER following by Cardizem infusion - oral Amio started by cardiology on 3/11 - was on Amio at one point during last admission but this was stopped prior to discharge- he does have significant COPD per pulm notes - replacing electrolytes aggressively - cardiology has changed Oral Amio (started yesterday) to IV   Abnormal thyroid functions - THS elevated at 5.003- F T4 elevated at 1.39 - doubt this mild elevated is causing SVT - TSH was normal on 05/05/16 and 06/11/16 - now on Amiodarone as well which can result in further hyperthyroidism - will recommend these be rechecked in 3-4 wks  B/l pleural effusions, dCHF- chronic 2+ pedal edema - effusions were present on last admission as well and are not new - L thoracentesis done on 2/14 with 1.5 L dark bloody fluid removed - 3/11 > ECHO shows grade 2 dCHF and normal EF - on Bumex at home - has not been taking his Losartan due to low BP - was due to see CHF clinic on 3/12 - placed on IV Lasix here- not candidate for thoracentesis due to platelets < 5 -  cardiology consulted and has increased lasix to 40 mg IV BID  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  - has been using Proair, 10 mg Prednisone daily, Tussionex at bedtime and 3 L O2 continuously - will use Xopenex in hospital PRN only due to SVT - add Pulmicort to control congestion- cont Atovent nebs - follow for worsening fibrosis with Amiodarone    ITP  - reviewed Dr Geralyn Flash notes - s/p IVIG infusions, rituximab, cyclophosphamide - evaluated by him today- he would like to stop Cyclophosphamide and Prednisone today- will wean off prednisone as he has been taking it for 4 mo and we are diuresing him aggressively which can cause BP to drop- - start Prednisone 5mg  in AM   Anemia - acute- Hb dropping from 11.2 to 7.1 (this was reconfirmed with a second blood draw) - cause unknown -- given 1 U PRBC- follow   Nose Bleeds - Amicar    Diabetes mellitus without complication - T7S 6.6 - he is dictating to RN how much insulin to give him- follow sugars    DVT prophylaxis: SCDs Code Status: TEDS Family Communication:  Disposition Plan: follow in SDU Consultants:   cardiology  oncology Procedures:     Antimicrobials:  Anti-infectives    None       Objective: Vitals:   07/10/16 1015 07/10/16 1050 07/10/16 1200 07/10/16 1348  BP: (!) 113/50 116/65 (!) 130/43 120/66  Pulse: 98  96 99 (!) 126  Resp: (!) 32 20 (!) 31 19  Temp: 99.6 F (37.6 C) 98.4 F (36.9 C) 98.5 F (36.9 C) 98.8 F (37.1 C)  TempSrc: Oral Oral Oral Oral  SpO2: 91% 94% 90% 96%  Weight:      Height:        Intake/Output Summary (Last 24 hours) at 07/10/16 1405 Last data filed at 07/10/16 1348  Gross per 24 hour  Intake              915 ml  Output              350 ml  Net              565 ml   Filed Weights   07/08/16 2035 07/09/16 1600 07/10/16 0500  Weight: 91.2 kg (201 lb) 88.5 kg (195 lb 1.7 oz) 89.6 kg (197 lb 8.5 oz)    Examination: General exam: Appears  comfortable  HEENT: PERRLA, oral mucosa moist, no sclera icterus or thrush- trickle of blood from nose Respiratory system: decreased breath sound in left lung base Respiratory effort normal. Pulse ox 95% on 4 L Cardiovascular system: S1 & S2 heard, RRR.  No murmurs  Gastrointestinal system: Abdomen soft, non-tender, nondistended. Normal bowel sound. No organomegaly Central nervous system: Alert and oriented. No focal neurological deficits. Extremities: No cyanosis, clubbing - 2 + pitting edema in feet and legs Skin: No rashes or ulcers Psychiatry:  Mood & affect appropriate.     Data Reviewed: I have personally reviewed following labs and imaging studies  CBC:  Recent Labs Lab 07/08/16 2058 07/10/16 0357 07/10/16 0753  WBC 9.1 10.2 9.8  NEUTROABS 6.1  --   --   HGB 11.2* 7.0* 7.1*  HCT 34.6* 23.3* 23.5*  MCV 88.3 90.3 90.7  PLT <5* <5* <5*   Basic Metabolic Panel:  Recent Labs Lab 07/08/16 2058 07/09/16 0528 07/10/16 0357  NA 134*  --  135  K 3.7  --  4.2  CL 85*  --  88*  CO2 37*  --  40*  GLUCOSE 206*  --  192*  BUN 32*  --  25*  CREATININE 1.23  --  0.85  CALCIUM 8.3*  --  8.1*  MG  --  1.0* 1.7   GFR: Estimated Creatinine Clearance: 81.2 mL/min (by C-G formula based on SCr of 0.85 mg/dL). Liver Function Tests:  Recent Labs Lab 07/08/16 2058  AST 24  ALT 13*  ALKPHOS 71  BILITOT 0.4  PROT 7.0  ALBUMIN 2.7*   No results for input(s): LIPASE, AMYLASE in the last 168 hours. No results for input(s): AMMONIA in the last 168 hours. Coagulation Profile:  Recent Labs Lab 07/09/16 0536  INR 1.16   Cardiac Enzymes:  Recent Labs Lab 07/08/16 2058 07/09/16 0535 07/09/16 1722  TROPONINI <0.03 <0.03 <0.03   BNP (last 3 results)  Recent Labs  02/17/16 0948 05/05/16 1450  PROBNP 40.0 17.0   HbA1C:  Recent Labs  07/09/16 0536  HGBA1C 6.6*   CBG:  Recent Labs Lab 07/09/16 1336 07/09/16 1633 07/09/16 2114 07/10/16 0800 07/10/16 1213    GLUCAP 224* 275* 238* 195* 297*   Lipid Profile:  Recent Labs  07/09/16 0526  CHOL 126  HDL 41  LDLCALC 63  TRIG 110  CHOLHDL 3.1   Thyroid Function Tests:  Recent Labs  07/09/16 0526 07/09/16 0528  TSH 5.003*  --   FREET4  --  1.39*  Anemia Panel: No results for input(s): VITAMINB12, FOLATE, FERRITIN, TIBC, IRON, RETICCTPCT in the last 72 hours. Urine analysis:    Component Value Date/Time   COLORURINE YELLOW 06/10/2016 1155   APPEARANCEUR CLEAR 06/10/2016 1155   LABSPEC 1.010 06/10/2016 1155   PHURINE 5.0 06/10/2016 1155   GLUCOSEU NEGATIVE 06/10/2016 1155   HGBUR MODERATE (A) 06/10/2016 1155   BILIRUBINUR NEGATIVE 06/10/2016 1155   KETONESUR NEGATIVE 06/10/2016 1155   PROTEINUR NEGATIVE 06/10/2016 1155   UROBILINOGEN 1.0 11/22/2008 1922   NITRITE NEGATIVE 06/10/2016 1155   LEUKOCYTESUR LARGE (A) 06/10/2016 1155   Sepsis Labs: @LABRCNTIP (procalcitonin:4,lacticidven:4) ) Recent Results (from the past 240 hour(s))  MRSA PCR Screening     Status: None   Collection Time: 07/09/16  4:17 PM  Result Value Ref Range Status   MRSA by PCR NEGATIVE NEGATIVE Final    Comment:        The GeneXpert MRSA Assay (FDA approved for NASAL specimens only), is one component of a comprehensive MRSA colonization surveillance program. It is not intended to diagnose MRSA infection nor to guide or monitor treatment for MRSA infections.          Radiology Studies: Dg Chest 2 View  Result Date: 07/08/2016 CLINICAL DATA:  Shortness of breath and atrial fibrillation. EXAM: CHEST  2 VIEW COMPARISON:  06/27/2016 FINDINGS: Moderate bilateral pleural effusions are again noted, slightly increased on the left. Bilateral lower lung atelectasis, pulmonary vascular congestion and probable interstitial edema again noted. There is no evidence of pneumothorax. No other significant changes identified. IMPRESSION: Pulmonary vascular congestion and probable interstitial edema again noted.  Moderate bilateral pleural effusions, slightly increased on the left. Continued bilateral lower lung atelectasis. Electronically Signed   By: Margarette Canada M.D.   On: 07/08/2016 20:11      Scheduled Meds: . sodium chloride   Intravenous Once  . aminocaproic acid  1 g Oral Q8H  . amiodarone  150 mg Intravenous Once  . atorvastatin  40 mg Oral q1800  . budesonide (PULMICORT) nebulizer solution  0.25 mg Nebulization BID  . furosemide  40 mg Intravenous BID  . insulin aspart  0-5 Units Subcutaneous QHS  . insulin aspart  0-9 Units Subcutaneous TID WC  . insulin glargine  22 Units Subcutaneous Daily  . ipratropium  0.5 mg Nebulization TID  . levalbuterol  1.25 mg Nebulization TID  . loratadine  10 mg Oral Daily  . multivitamin  15 mL Oral Daily  . pantoprazole  40 mg Oral Daily  . predniSONE  10 mg Oral Q breakfast   Continuous Infusions: . amiodarone 60 mg/hr (07/10/16 1013)   Followed by  . amiodarone    . diltiazem (CARDIZEM) infusion 10 mg/hr (07/10/16 1345)     LOS: 1 day    Time spent in minutes: 96    [place], MD [facility] Pager: [email or web address] Password [facility] 07/10/2016, 2:05 PM

## 2016-07-10 NOTE — Progress Notes (Signed)
HEMATOLOGY-ONCOLOGY PROGRESS NOTE  SUBJECTIVE: Severe refractory ITP [name] is been admitted to the hospital with tachycardia and was started on amiodarone drip. [name] has had profound refractory ITP that has not responded to steroids as well as IVIG and Rituxan. [name] was just recently started on Cytoxan. However we've had extensive discussions about his treatment course and we determined that splenectomy might be his best treatment option going forward. Patient is having nosebleeds and this is because his Amicar was held briefly. [name] received Amicar today and [name] is awaiting his nosebleeds to stop.   [name] is chronically short of breath and uses oxygen by nasal cannula. [name] had to undergo thoracentesis for pleural effusion.  OBJECTIVE: REVIEW OF SYSTEMS:   Constitutional: Denies fevers, chills or abnormal weight loss Eyes: Denies blurriness of vision Ears, nose, mouth, throat: Nosebleeds Resp: Shortness of breath to minimal exertion, cardiovascular: Patient had palpitations tinal:  Denies nausea, heartburn or change in bowel habits Skin: Denies abnormal skin rashes Lymphatics: Denies new lymphadenopathy or easy bruising Neurological:Denies numbness, tingling or new weaknesses Behavioral/Psych: Mood is stable, no new changes  Extremities: 2 + lower extremity edema All other systems were reviewed with the patient and are negative.  I have reviewed the past medical history, past surgical history, social history and family history with the patient and they are unchanged from previous note.   PHYSICAL EXAMINATION: ECOG PERFORMANCE STATUS: 3 - Symptomatic, >50% confined to bed  Vitals:   07/10/16 1200 07/10/16 1348  BP: (!) 130/43 120/66  Pulse: 99 (!) 126  Resp: (!) 31 19  Temp: 98.5 F (36.9 C) 98.8 F (37.1 C)   Filed Weights   07/08/16 2035 07/09/16 1600 07/10/16 0500  Weight: 201 lb (91.2 kg) 195 lb 1.7 oz (88.5 kg) 197 lb 8.5 oz (89.6 kg)    GENERAL:alert, no distress and  comfortable SKIN: skin color, texture, turgor are normal, no rashes or significant lesions EYES: normal, Conjunctiva are pink and non-injected, sclera clear OROPHARYNX:no exudate, no erythema and lips, buccal mucosa, and tongue normal  NECK: supple, thyroid normal size, non-tender, without nodularity LYMPH:  no palpable lymphadenopathy in the cervical, axillary or inguinal LUNGS: clear to auscultation and percussion with normal breathing effort HEART: regular rate & rhythm and no murmurs and no lower extremity edema ABDOMEN:abdomen soft, non-tender and normal bowel sounds Musculoskeletal:no cyanosis of digits and no clubbing  NEURO: alert & oriented x 3 with fluent speech, no focal motor/sensory deficits  LABORATORY DATA:  I have reviewed the data as listed CMP Latest Ref Rng & Units 07/10/2016 07/08/2016 06/19/2016  Glucose 65 - 99 mg/dL 192(H) 206(H) 184(H)  BUN 6 - 20 mg/dL 25(H) 32(H) 20  Creatinine 0.61 - 1.24 mg/dL 0.85 1.23 0.80  Sodium 135 - 145 mmol/L 135 134(L) 135  Potassium 3.5 - 5.1 mmol/L 4.2 3.7 4.3  Chloride 101 - 111 mmol/L 88(L) 85(L) 85(L)  CO2 22 - 32 mmol/L 40(H) 37(H) 45(H)  Calcium 8.9 - 10.3 mg/dL 8.1(L) 8.3(L) 8.9  Total Protein 6.5 - 8.1 g/dL - 7.0 -  Total Bilirubin 0.3 - 1.2 mg/dL - 0.4 -  Alkaline Phos 38 - 126 U/L - 71 -  AST 15 - 41 U/L - 24 -  ALT 17 - 63 U/L - 13(L) -    Lab Results  Component Value Date   WBC 9.8 07/10/2016   HGB 7.1 (L) 07/10/2016   HCT 23.5 (L) 07/10/2016   MCV 90.7 07/10/2016   PLT <5 (LL) 07/10/2016  NEUTROABS 6.1 07/08/2016    ASSESSMENT AND PLAN: 1. Profound  refractory ITP: Failed multiple lines of therapy. I recommended that the patient undergo splenectomy. Patient is in agreement. I asked Dr. Barry Dienes to see the patient. She discussed with me that the if she felt [name] is a good candidate [name] may undergo splenectomy on Thursday. We will have to transfuse him platelets before during and after splenectomy surgery. If it is  successful platelet counts will increase within a week. Literature suggests that there is an 80% success rate with splenectomy.  I will discontinue Cytoxan and dexamethasone.  2. nosebleeds: Patient will need Amicar to be given round-the-clock to improve the bleeding. 3. Severe anemia: Blood transfusion being given. 4. Severe tachycardia: Cardiology seeing the patient and they have him on amiodarone 5. Encouraged him to use SCDs

## 2016-07-10 NOTE — Consult Note (Signed)
Reason for Consult:ITP Referring Physician: Sonny Dandy PCP:  [name], MD   [name] is an 76 y.o. male.   Chief complaint: Shortness of breath and dependent edema with uncontrolled atrial fibrillation with RVR.  HPI: 76 year old with multiple medical problems, including chronic shortness of breath over the last 4 months. He presented with atrial fibrillation RVR rates up in the 170 range with hypotension., shortness of breath and lower extremity edema. He was cardioverted in the emergency department, admitted by the Medicine service. He was Placed on amiodarone his electrolytes were corrected. Work up here shows Glucose of 192, CL 88, BUN 25 CT scan from 06/09/16: no adenopathy, large bilateral effusions, spleen was unremarkable. Significant: Coronary, aortic arch, and branch vessel atherosclerotic vascular disease. Bilateral chronic appearing maxillary sinusitis. Cervical, thoracic, and lumbar spondylosis. Degenerative arthropathy of both hips.  ,   Additional findings include bilateral pleural effusions with prior left thoracentesis 06/14/16.  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 Seen in consultation by Dr. Lorna Few and [place].  Labs on admission shows a hemoglobin of 7 and hematocrit of 23. Platelet counts are less than 5000. Repeat study this a.m. is unchanged. Dr. Barry Dienes was contacted by Dr. Sonny Dandy. he requested Dr. Barry Dienes see the patient and consider him for elective splenectomy, to be done laparoscopically.    Past Medical History:  Diagnosis Date  . Adenomatous colon polyp    01/2006  . Chronic pansinusitis   . COPD (chronic obstructive pulmonary disease) ([place])   . Diverticula of colon   . History of ITP   . History of nephrolithiasis   . Hyperlipemia   . Hypertension   . 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 Pleural effusion   . Sinus drainage    Cough with post nasal drip  . Thrombocytopenia ([place])   . Type 2 diabetes  mellitus ([place])     Past Surgical History:  Procedure Laterality Date  . COLONOSCOPY    . ESOPHAGOGASTRODUODENOSCOPY    . ESOPHAGOGASTRODUODENOSCOPY  05/19/2011   Procedure: ESOPHAGOGASTRODUODENOSCOPY (EGD);  Surgeon: Wonda Horner, MD;  Location: [facility];  Service: Endoscopy;  Laterality: N/A;  stat CBC  APC also needed  . HOT HEMOSTASIS  05/19/2011   Procedure: HOT HEMOSTASIS (ARGON PLASMA COAGULATION/BICAP);  Surgeon: Wonda Horner, MD;  Location: [facility];  Service: Endoscopy;  Laterality: N/A;  . Spinal cyst resection      Family History  Problem Relation Age of Onset  . Colon cancer Mother   . Heart disease Mother   . Liver cancer Father   . Heart disease Father     Social History:  reports that he quit smoking about 15 years ago. His smoking use included Cigarettes. He started smoking about 57 years ago. He has a 42.00 pack-year smoking history. He has never used smokeless tobacco. He reports that he drinks alcohol. He reports that he does not use drugs.  Allergies: No Known Allergies  Prior to Admission medications   Medication Sig Start Date End Date Taking? Authorizing Provider  albuterol (PROAIR HFA) 108 (90 Base) MCG/ACT inhaler 2 puffs every 4 hours as needed only  if your can't catch your breath 04/04/16  Yes Tanda Rockers, MD  aminocaproic acid (AMICAR) 500 MG tablet Take 4 tablets (2,000 mg total) by mouth every 8 (eight) hours. 06/27/16  Yes Nicholas Lose, MD  bumetanide (BUMEX) 1 MG tablet Take 1 tablet (1 mg total) by mouth 2 (two) times daily.  06/27/16  Yes Nicholas Lose, MD  chlorpheniramine-HYDROcodone (TUSSIONEX) 10-8 MG/5ML SUER Take 5 mLs by mouth at bedtime as needed for cough. 06/20/16  Yes Clanford Marisa Hua, MD  cyclophosphamide (CYTOXAN) 50 MG tablet Take 3 tablets (150 mg total) by mouth daily. Give on an empty stomach 1 hour before or 2 hours after meals. 07/03/16  Yes Nicholas Lose, MD  ECHINACEA EXTRACT PO Take 1 capsule by mouth daily.   Yes  Historical Provider, MD  famotidine (PEPCID) 20 MG tablet One at bedtime Patient taking differently: Take 20 mg by mouth at bedtime.  02/02/16  Yes Tanda Rockers, MD  fexofenadine (ALLEGRA) 180 MG tablet Take 180 mg by mouth daily.   Yes Historical Provider, MD  GINKGO BILOBA PO Take 1 tablet by mouth daily.    Yes Historical Provider, MD  Glucosamine-Chondroit-Vit C-Mn (GLUCOSAMINE 1500 COMPLEX PO) Take 1,500 mg by mouth 2 (two) times daily.   Yes Historical Provider, MD  Insulin Glargine (TOUJEO SOLOSTAR) 300 UNIT/ML SOPN Inject 30 Units into the skin daily. 06/23/16  Yes Nicholas Lose, MD  metFORMIN (GLUCOPHAGE) 1000 MG tablet Take 1,000 mg by mouth 2 (two) times daily with a meal.   Yes Historical Provider, MD  Multiple Vitamins-Minerals (MULTIVITAMIN PO) Take 1 tablet by mouth daily.    Yes Historical Provider, MD  pantoprazole (PROTONIX) 40 MG tablet Take 1 tablet (40 mg total) by mouth daily. Take 30-60 min before first meal of the day 05/03/16  Yes Tanda Rockers, MD  predniSONE (DELTASONE) 10 MG tablet Take 1 tablet (10 mg total) by mouth daily with breakfast. 06/23/16  Yes Nicholas Lose, MD  Respiratory Therapy Supplies (FLUTTER) DEVI Use as directed 02/02/16  Yes Tanda Rockers, MD  simvastatin (ZOCOR) 80 MG tablet Take 80 mg by mouth daily.    Yes Historical Provider, MD  losartan (COZAAR) 50 MG tablet Take 1 tablet (50 mg total) by mouth daily. Patient not taking: Reported on 07/08/2016 06/23/16   Nicholas Lose, MD     Results for orders placed or performed during the hospital encounter of 07/08/16 (from the past 48 hour(s))  CBC with Differential     Status: Abnormal   Collection Time: 07/08/16  8:58 PM  Result Value Ref Range   WBC 9.1 4.0 - 10.5 K/uL   RBC 3.92 (L) 4.22 - 5.81 MIL/uL   Hemoglobin 11.2 (L) 13.0 - 17.0 g/dL   HCT 34.6 (L) 39.0 - 52.0 %   MCV 88.3 78.0 - 100.0 fL   MCH 28.6 26.0 - 34.0 pg   MCHC 32.4 30.0 - 36.0 g/dL   RDW 14.9 11.5 - 15.5 %   Platelets <5 (LL) 150 -  400 K/uL    Comment: CRITICAL RESULT CALLED TO, READ BACK BY AND VERIFIED WITH: GRAHAM,S RN [phone] [place],N    Neutrophils Relative % 67 %   Lymphocytes Relative 25 %   Monocytes Relative 7 %   Eosinophils Relative 1 %   Basophils Relative 0 %   Neutro Abs 6.1 1.7 - 7.7 K/uL   Lymphs Abs 2.3 0.7 - 4.0 K/uL   Monocytes Absolute 0.6 0.1 - 1.0 K/uL   Eosinophils Absolute 0.1 0.0 - 0.7 K/uL   Basophils Absolute 0.0 0.0 - 0.1 K/uL   RBC Morphology POLYCHROMASIA PRESENT    WBC Morphology SPECIMEN CHECKED FOR CLOTS    Smear Review PLATELET COUNT CONFIRMED BY SMEAR   Comprehensive metabolic panel     Status: Abnormal   Collection  Time: 07/08/16  8:58 PM  Result Value Ref Range   Sodium 134 (L) 135 - 145 mmol/L   Potassium 3.7 3.5 - 5.1 mmol/L   Chloride 85 (L) 101 - 111 mmol/L   CO2 37 (H) 22 - 32 mmol/L   Glucose, Bld 206 (H) 65 - 99 mg/dL   BUN 32 (H) 6 - 20 mg/dL   Creatinine, Ser 1.23 0.61 - 1.24 mg/dL   Calcium 8.3 (L) 8.9 - 10.3 mg/dL   Total Protein 7.0 6.5 - 8.1 g/dL   Albumin 2.7 (L) 3.5 - 5.0 g/dL   AST 24 15 - 41 U/L   ALT 13 (L) 17 - 63 U/L   Alkaline Phosphatase 71 38 - 126 U/L   Total Bilirubin 0.4 0.3 - 1.2 mg/dL   GFR calc non Af Amer 56 (L) >60 mL/min   GFR calc Af Amer >60 >60 mL/min    Comment: (NOTE) The eGFR has been calculated using the CKD EPI equation. This calculation has not been validated in all clinical situations. eGFR's persistently <60 mL/min signify possible Chronic Kidney Disease.    Anion gap 12 5 - 15  Troponin I     Status: None   Collection Time: 07/08/16  8:58 PM  Result Value Ref Range   Troponin I <0.03 <0.03 ng/mL  Brain natriuretic peptide     Status: Abnormal   Collection Time: 07/08/16 11:08 PM  Result Value Ref Range   B Natriuretic Peptide 113.8 (H) 0.0 - 100.0 pg/mL  POC CBG, ED     Status: Abnormal   Collection Time: 07/09/16  4:17 AM  Result Value Ref Range   Glucose-Capillary 172 (H) 65 - 99 mg/dL   Comment 1  Notify RN   Cortisol-am, blood     Status: None   Collection Time: 07/09/16  5:25 AM  Result Value Ref Range   Cortisol - AM 9.5 6.7 - 22.6 ug/dL    Comment: Performed at [facility], [place] [address] [place], [place] [address]  TSH     Status: Abnormal   Collection Time: 07/09/16  5:26 AM  Result Value Ref Range   TSH 5.003 (H) 0.350 - 4.500 uIU/mL    Comment: Performed by a 3rd Generation assay with a functional sensitivity of <=0.01 uIU/mL.  Lipid panel     Status: None   Collection Time: 07/09/16  5:26 AM  Result Value Ref Range   Cholesterol 126 0 - 200 mg/dL   Triglycerides 110 <150 mg/dL   HDL 41 >40 mg/dL   Total CHOL/HDL Ratio 3.1 RATIO   VLDL 22 0 - 40 mg/dL   LDL Cholesterol 63 0 - 99 mg/dL    Comment:        Total Cholesterol/HDL:CHD Risk Coronary Heart Disease Risk Table                     Men   Women  1/2 Average Risk   3.4   3.3  Average Risk       5.0   4.4  2 X Average Risk   9.6   7.1  3 X Average Risk  23.4   11.0        Use the calculated Patient Ratio above and the CHD Risk Table to determine the patient's CHD Risk.        ATP III CLASSIFICATION (LDL):  <100     mg/dL   Optimal  100-129  mg/dL  Near or Above                    Optimal  130-159  mg/dL   Borderline  160-189  mg/dL   High  >190     mg/dL   Very High Performed at [facility] [address] [place], [place] [address]   T4, free     Status: Abnormal   Collection Time: 07/09/16  5:28 AM  Result Value Ref Range   Free T4 1.39 (H) 0.61 - 1.12 ng/dL    Comment: (NOTE) Biotin ingestion may interfere with free T4 tests. If the results are inconsistent with the TSH level, previous test results, or the clinical presentation, then consider biotin interference. If needed, order repeat testing after stopping biotin. Performed at [facility], [place] [address], [place], [place] [address]   Magnesium     Status: Abnormal   Collection Time: 07/09/16  5:28 AM  Result Value  Ref Range   Magnesium 1.0 (L) 1.7 - 2.4 mg/dL  Troponin I (q 6hr x 3)     Status: None   Collection Time: 07/09/16  5:35 AM  Result Value Ref Range   Troponin I <0.03 <0.03 ng/mL  Protime-INR     Status: None   Collection Time: 07/09/16  5:36 AM  Result Value Ref Range   Prothrombin Time 14.9 11.4 - 15.2 seconds   INR 1.16   Hemoglobin A1c     Status: Abnormal   Collection Time: 07/09/16  5:36 AM  Result Value Ref Range   Hgb A1c MFr Bld 6.6 (H) 4.8 - 5.6 %    Comment: (NOTE)         Pre-diabetes: 5.7 - 6.4         Diabetes: >6.4         Glycemic control for adults with diabetes: <7.0    Mean Plasma Glucose 143 mg/dL    Comment: (NOTE) Performed At: [facility] [address] [place], [place] [id number] Lindon Romp MD QQ:[id number]   CBG monitoring, ED     Status: Abnormal   Collection Time: 07/09/16  8:22 AM  Result Value Ref Range   Glucose-Capillary 211 (H) 65 - 99 mg/dL  Prealbumin     Status: Abnormal   Collection Time: 07/09/16 11:50 AM  Result Value Ref Range   Prealbumin 10.1 (L) 18 - 38 mg/dL    Comment: Performed at [facility], [place] [address], [place], [place] [address]  CBG monitoring, ED     Status: Abnormal   Collection Time: 07/09/16  1:36 PM  Result Value Ref Range   Glucose-Capillary 224 (H) 65 - 99 mg/dL  MRSA PCR Screening     Status: None   Collection Time: 07/09/16  4:17 PM  Result Value Ref Range   MRSA by PCR NEGATIVE NEGATIVE    Comment:        The GeneXpert MRSA Assay (FDA approved for NASAL specimens only), is one component of a comprehensive MRSA colonization surveillance program. It is not intended to diagnose MRSA infection nor to guide or monitor treatment for MRSA infections.   Glucose, capillary     Status: Abnormal   Collection Time: 07/09/16  4:33 PM  Result Value Ref Range   Glucose-Capillary 275 (H) 65 - 99 mg/dL  Troponin I (q 6hr x 3)     Status: None   Collection Time: 07/09/16  5:22 PM  Result  Value Ref  Range   Troponin I <0.03 <0.03 ng/mL  Glucose, capillary     Status: Abnormal   Collection Time: 07/09/16  9:14 PM  Result Value Ref Range   Glucose-Capillary 238 (H) 65 - 99 mg/dL  Basic metabolic panel     Status: Abnormal   Collection Time: 07/10/16  3:57 AM  Result Value Ref Range   Sodium 135 135 - 145 mmol/L   Potassium 4.2 3.5 - 5.1 mmol/L   Chloride 88 (L) 101 - 111 mmol/L   CO2 40 (H) 22 - 32 mmol/L   Glucose, Bld 192 (H) 65 - 99 mg/dL   BUN 25 (H) 6 - 20 mg/dL   Creatinine, Ser 0.85 0.61 - 1.24 mg/dL   Calcium 8.1 (L) 8.9 - 10.3 mg/dL   GFR calc non Af Amer >60 >60 mL/min   GFR calc Af Amer >60 >60 mL/min    Comment: (NOTE) The eGFR has been calculated using the CKD EPI equation. This calculation has not been validated in all clinical situations. eGFR's persistently <60 mL/min signify possible Chronic Kidney Disease.    Anion gap 7 5 - 15  Magnesium     Status: None   Collection Time: 07/10/16  3:57 AM  Result Value Ref Range   Magnesium 1.7 1.7 - 2.4 mg/dL  CBC     Status: Abnormal   Collection Time: 07/10/16  3:57 AM  Result Value Ref Range   WBC 10.2 4.0 - 10.5 K/uL   RBC 2.58 (L) 4.22 - 5.81 MIL/uL   Hemoglobin 7.0 (L) 13.0 - 17.0 g/dL    Comment: DELTA CHECK NOTED REPEATED TO VERIFY    HCT 23.3 (L) 39.0 - 52.0 %   MCV 90.3 78.0 - 100.0 fL   MCH 27.1 26.0 - 34.0 pg   MCHC 30.0 30.0 - 36.0 g/dL   RDW 15.0 11.5 - 15.5 %   Platelets <5 (LL) 150 - 400 K/uL    Comment: REPEATED TO VERIFY PLATELET COUNT CONFIRMED BY SMEAR CRITICAL VALUE NOTED.  VALUE IS CONSISTENT WITH PREVIOUSLY REPORTED AND CALLED VALUE.   Type and screen [place]     Status: None (Preliminary result)   Collection Time: 07/10/16  5:35 AM  Result Value Ref Range   ABO/RH(D) A POS    Antibody Screen NEG    Sample Expiration 07/13/2016    Unit Number [id number]    Blood Component Type RED CELLS,LR    Unit division 00    Status of Unit ISSUED     Transfusion Status OK TO TRANSFUSE    Crossmatch Result Compatible   Prepare RBC     Status: None   Collection Time: 07/10/16  5:42 AM  Result Value Ref Range   Order Confirmation ORDER PROCESSED BY BLOOD BANK   CBC     Status: Abnormal   Collection Time: 07/10/16  7:53 AM  Result Value Ref Range   WBC 9.8 4.0 - 10.5 K/uL   RBC 2.59 (L) 4.22 - 5.81 MIL/uL   Hemoglobin 7.1 (L) 13.0 - 17.0 g/dL   HCT 23.5 (L) 39.0 - 52.0 %   MCV 90.7 78.0 - 100.0 fL   MCH 27.4 26.0 - 34.0 pg   MCHC 30.2 30.0 - 36.0 g/dL   RDW 15.0 11.5 - 15.5 %   Platelets <5 (LL) 150 - 400 K/uL    Comment: CRITICAL VALUE NOTED.  VALUE IS CONSISTENT WITH PREVIOUSLY REPORTED AND CALLED VALUE.  Glucose, capillary  Status: Abnormal   Collection Time: 07/10/16  8:00 AM  Result Value Ref Range   Glucose-Capillary 195 (H) 65 - 99 mg/dL  Glucose, capillary     Status: Abnormal   Collection Time: 07/10/16 12:13 PM  Result Value Ref Range   Glucose-Capillary 297 (H) 65 - 99 mg/dL    Dg Chest 2 View  Result Date: 07/08/2016 CLINICAL DATA:  Shortness of breath and atrial fibrillation. EXAM: CHEST  2 VIEW COMPARISON:  06/27/2016 FINDINGS: Moderate bilateral pleural effusions are again noted, slightly increased on the left. Bilateral lower lung atelectasis, pulmonary vascular congestion and probable interstitial edema again noted. There is no evidence of pneumothorax. No other significant changes identified. IMPRESSION: Pulmonary vascular congestion and probable interstitial edema again noted. Moderate bilateral pleural effusions, slightly increased on the left. Continued bilateral lower lung atelectasis. Electronically Signed   By: Margarette Canada M.D.   On: 07/08/2016 20:11    Review of Systems  Constitutional: Positive for malaise/fatigue and weight loss (10 lbs last 6 months, and 50 lbs over last 3 years). Negative for chills and fever.  HENT: Positive for hearing loss and nosebleeds (ongoing nose bleen now).   Eyes:        Reading glasses  Respiratory: Positive for cough and shortness of breath. Negative for hemoptysis, sputum production and wheezing.   Cardiovascular: Positive for palpitations (about 4 months, thought to  be SVT before this), orthopnea, leg swelling and PND. Negative for claudication.  Gastrointestinal: Positive for heartburn (on  PPI). Negative for abdominal pain, blood in stool, constipation, diarrhea, melena, nausea and vomiting.  Genitourinary: Negative.   Musculoskeletal: Positive for joint pain.  Skin: Negative.   Neurological: Positive for weakness.  Endo/Heme/Allergies: Bruises/bleeds easily.  Psychiatric/Behavioral: Positive for depression. The patient is nervous/anxious.    Blood pressure 120/66, pulse (!) 126, temperature 98.8 F (37.1 C), temperature source Oral, resp. rate 19, height '5\' 11"'$  (1.803 m), weight 89.6 kg (197 lb 8.5 oz), SpO2 96 %. Physical Exam  Constitutional: He is oriented to person, place, and time.  Chronically ill male up in chair, SOB with ongoing nose bleed.  Bilateral lower leg edema.  Eyes: Right eye exhibits no discharge. Left eye exhibits no discharge. No scleral icterus.  Neck: Normal range of motion. No JVD present. No tracheal deviation present. No thyromegaly present.  Cardiovascular: Normal heart sounds.   No murmur heard. AF irregular rhythm remains tachycardic sitting up in chair. He has +2-3 edema lower legs and feet, I cannot feel pulses, but feet are warm and perfused.  Respiratory: Effort normal. No respiratory distress. He has no wheezes. He has no rales. He exhibits no tenderness.  BS down in both bases  GI: Soft. Bowel sounds are normal. He exhibits no distension and no mass. There is no tenderness. There is no rebound and no guarding.  Musculoskeletal: He exhibits edema (+2-3 both lower legs and feet). He exhibits no tenderness.  Lymphadenopathy:    He has no cervical adenopathy.  Neurological: He is alert and oriented to person, place,  and time. No cranial nerve deficit.  Skin: Skin is warm. No rash noted. No erythema. No pallor.  Psychiatric: He has a normal mood and affect. His behavior is normal. Judgment and thought content normal.  He is pretty depressed about his condition.  He was fine 4-5 months ago and now everything is going wrong.      Assessment/Plan: 1.  Chronic/refractory immune thrombocytopenic purpura 2.  Atrial fibrillation with RVR  -  normal EF with grade 2 diastolic dysfunction, calcified mitral annulus with trivial MR. Trivial TR 3.  R>L pleural effusions/status post ultrasound-guided left thoracentesis 06/14/16;1.5 liters Pathology: REACTIVE MESOTHELIAL CELLS PRESENT, MIXED LYMPHOID POPULATION. 4.  COPD/History tobacco use - 42-pack-year history 5.  Hypertension 6.  Hyperlipidemia 7.  Type 2 diabetes mellitus. 8.  Bilateral lower extremity edema/fluid overload  Plan:  Dr.Byerly will review and discuss laparoscopic splenectomy when he is medically stable for the procedure.  He has a Type and screen in the system currently.    [name] 07/10/2016, 2:00 PM

## 2016-07-10 NOTE — Progress Notes (Signed)
Patient Hgb drop to 7 from 11.2. Last CBC checked was 3/10 2200. N.P. Informed. N.P ordered Blood transfusion. WIll continue to monitor the pt.

## 2016-07-11 ENCOUNTER — Inpatient Hospital Stay (HOSPITAL_COMMUNITY): Payer: Medicare HMO

## 2016-07-11 DIAGNOSIS — D693 Immune thrombocytopenic purpura: Secondary | ICD-10-CM

## 2016-07-11 DIAGNOSIS — K219 Gastro-esophageal reflux disease without esophagitis: Secondary | ICD-10-CM

## 2016-07-11 LAB — BASIC METABOLIC PANEL
ANION GAP: 6 (ref 5–15)
BUN: 19 mg/dL (ref 6–20)
CO2: 43 mmol/L — ABNORMAL HIGH (ref 22–32)
Calcium: 8.2 mg/dL — ABNORMAL LOW (ref 8.9–10.3)
Chloride: 87 mmol/L — ABNORMAL LOW (ref 101–111)
Creatinine, Ser: 0.79 mg/dL (ref 0.61–1.24)
Glucose, Bld: 165 mg/dL — ABNORMAL HIGH (ref 65–99)
POTASSIUM: 3.5 mmol/L (ref 3.5–5.1)
SODIUM: 136 mmol/L (ref 135–145)

## 2016-07-11 LAB — CBC
HCT: 26.9 % — ABNORMAL LOW (ref 39.0–52.0)
HEMOGLOBIN: 8.3 g/dL — AB (ref 13.0–17.0)
MCH: 27 pg (ref 26.0–34.0)
MCHC: 30.9 g/dL (ref 30.0–36.0)
MCV: 87.6 fL (ref 78.0–100.0)
Platelets: <5 10*3/uL — CL (ref 150–400)
RBC: 3.07 MIL/uL — ABNORMAL LOW (ref 4.22–5.81)
RDW: 15.9 % — ABNORMAL HIGH (ref 11.5–15.5)
WBC: 9.8 10*3/uL (ref 4.0–10.5)

## 2016-07-11 LAB — PROTEIN ELECTROPHORESIS, SERUM
A/G Ratio: 0.6 — ABNORMAL LOW (ref 0.7–1.7)
ALBUMIN ELP: 2.4 g/dL — AB (ref 2.9–4.4)
ALPHA-1-GLOBULIN: 0.3 g/dL (ref 0.0–0.4)
ALPHA-2-GLOBULIN: 1.1 g/dL — AB (ref 0.4–1.0)
BETA GLOBULIN: 0.9 g/dL (ref 0.7–1.3)
GAMMA GLOBULIN: 1.4 g/dL (ref 0.4–1.8)
Globulin, Total: 3.7 g/dL (ref 2.2–3.9)
Total Protein ELP: 6.1 g/dL (ref 6.0–8.5)

## 2016-07-11 LAB — GLUCOSE, CAPILLARY
GLUCOSE-CAPILLARY: 201 mg/dL — AB (ref 65–99)
GLUCOSE-CAPILLARY: 257 mg/dL — AB (ref 65–99)
GLUCOSE-CAPILLARY: 219 mg/dL — AB (ref 65–99)
Glucose-Capillary: 146 mg/dL — ABNORMAL HIGH (ref 65–99)
Glucose-Capillary: 233 mg/dL — ABNORMAL HIGH (ref 65–99)

## 2016-07-11 LAB — MAGNESIUM: MAGNESIUM: 1.5 mg/dL — AB (ref 1.7–2.4)

## 2016-07-11 MED ORDER — FUROSEMIDE 10 MG/ML IJ SOLN
40.0000 mg | Freq: Three times a day (TID) | INTRAMUSCULAR | Status: DC
  Administered 2016-07-11 – 2016-07-12 (×4): 40 mg via INTRAVENOUS
  Filled 2016-07-11 (×4): qty 4

## 2016-07-11 MED ORDER — AMIODARONE HCL 150 MG/3ML IV SOLN: 150.0000 mg | Freq: Once | INTRAVENOUS | Status: DC

## 2016-07-11 MED ORDER — SODIUM CHLORIDE 0.9 % IV SOLN: Freq: Once | INTRAVENOUS | Status: DC

## 2016-07-11 MED ORDER — AMIODARONE IV BOLUS ONLY 150 MG/100ML
150.0000 mg | Freq: Once | INTRAVENOUS | Status: AC
  Administered 2016-07-11: 150 mg via INTRAVENOUS
  Filled 2016-07-11: qty 100

## 2016-07-11 MED ORDER — LIP MEDEX EX OINT
TOPICAL_OINTMENT | CUTANEOUS | Status: AC
  Administered 2016-07-11: 14:00:00
  Filled 2016-07-11: qty 7

## 2016-07-11 MED ORDER — CARVEDILOL 3.125 MG PO TABS
3.1250 mg | ORAL_TABLET | Freq: Two times a day (BID) | ORAL | Status: DC
  Administered 2016-07-11 – 2016-07-13 (×4): 3.125 mg via ORAL
  Filled 2016-07-11 (×3): qty 1

## 2016-07-11 NOTE — Progress Notes (Addendum)
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 Evaluted by cardiology, Pulmonary and Oncology during that visit.   Returns for HR > 150 noted while he was checking Vitals at home. ER performed electrical cardioverstion due to concomitant complaint of dyspnea, then placed him on a Cardizem infusion.    Subjective: Nose bleed resolved. HR currently in 150s A-fib but he feels no palpitations or dyspnea. No chest pain. Overall feels the same as yesterday.   Assessment & Plan:   Principal Problem:  SVT  At home and in ER Paroxysmal A-fib with RVR subsequently - occurred during Feb admission as well - s/p electrical cardioversion in ER following by Cardizem infusion - oral Amio started by cardiology on 3/11 - was on Amio at one point during last admission but this was stopped prior to discharge- he does have significant COPD per pulm notes - replacing electrolytes aggressively - 3/12- cardiology changed Oral Amio to IV 3/13- HR In 150s- per RN it was in 14s when Cardizem infusion was a 12.5- it was then decreased to 10 and HR when up- now increased to 12.5 again-  Cardiology to follow    Abnormal thyroid functions - THS elevated at 5.003- F T4 elevated at 1.39 - doubt this mild elevation is causing SVT - TSH was normal on 05/05/16 and 06/11/16 - now on Amiodarone as well which can result in further hyperthyroidism - will recommend these be rechecked in 3-4 wks  B/l pleural effusions, dCHF- chronic 2+ pedal edema - effusions were present on last admission as well and are not new - L thoracentesis done on 2/14 with 1.5 L dark bloody fluid removed - 3/11 > ECHO shows grade 2 dCHF  and normal EF - on Bumex at home - has not been taking his Losartan due to low BP - was due to see CHF clinic on 3/12 but ended up in the [facility] - placed on IV Lasix here- not candidate for thoracentesis due to platelets < 5 - cardiology consulted and has increased lasix to 40 mg IV BID - 3/13- still in positive balance, effusion and pedal edema unchanged- increase Lasix to 40 TID- discussed with patient to cut back on oral liquids- also getting fluid through IV Amio and Cardizem  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  - has been using Proair, 10 mg Prednisone daily, Tussionex at bedtime and 3 L O2 continuously - will use Xopenex in hospital PRN only due to SVT - added Pulmicort to control congestion- cont Atovent nebs - follow for worsening fibrosis with Amiodarone    ITP  - reviewed Dr Geralyn Flash notes - s/p IVIG infusions, rituximab, cyclophosphamide -  3/12 per Dr [name]  stop Cyclophosphamide and Prednisone and surgery consult for splenectomy - I  will wean prednisone as he has been taking it for 4 mo,we are diuresing him aggressively and he in on Amio and Cardizem infusions all which can cause BP to drop-  Prednisone 5mg  today for a few days - Dr Barry Dienes to evaluate for splenectomy.   Anemia - acute-- cause unknown - baseline is 8- Hb of 11 on admission was likely inaccurate  --  given 1 U PRBC 3/12- Hb improved to 8.3  Nose Bleeds - Amicar    Diabetes mellitus without complication - J0D 6.6 - he is dictating to RN how much insulin to give him- follow sugars    DVT prophylaxis: SCDs Code Status: TEDS Family Communication:  Disposition Plan: follow in SDU due to arrythmias Consultants:   cardiology  oncology Procedures:     Antimicrobials:  Anti-infectives    None       Objective: Vitals:   07/11/16 0600 07/11/16 0700 07/11/16 0800 07/11/16 0900  BP: (!) 102/51 (!) 98/40 (!) 107/45 (!) 107/36  Pulse: 72 76 85 78  Resp: 19 (!) 27 (!)  21 (!) 24  Temp:   98.3 F (36.8 C)   TempSrc:      SpO2: 98% 98% 91% 94%  Weight:      Height:        Intake/Output Summary (Last 24 hours) at 07/11/16 1021 Last data filed at 07/11/16 1000  Gross per 24 hour  Intake          1631.03 ml  Output              700 ml  Net           931.03 ml   Filed Weights   07/09/16 1600 07/10/16 0500 07/11/16 0417  Weight: 88.5 kg (195 lb 1.7 oz) 89.6 kg (197 lb 8.5 oz) 89.2 kg (196 lb 10.4 oz)    Examination: General exam: Appears comfortable  HEENT: PERRLA, oral mucosa moist, no sclera icterus or thrush- trickle of blood from nose Respiratory system: decreased breath sound in b/l lung bases- Respiratory effort normal. Pulse ox 95% on 4 L Cardiovascular system: S1 & S2 heard, IIRR. tachycardic No murmurs  Gastrointestinal system: Abdomen soft, non-tender, nondistended. Normal bowel sound. No organomegaly Central nervous system: Alert and oriented. No focal neurological deficits. Extremities: No cyanosis, clubbing - 2 + pitting edema in feet and legs Skin: No rashes or ulcers Psychiatry:  Mood & affect appropriate.     Data Reviewed: I have personally reviewed following labs and imaging studies  CBC:  Recent Labs Lab 07/08/16 2058 07/10/16 0357 07/10/16 0753 07/10/16 1630 07/11/16 0353  WBC 9.1 10.2 9.8  --  9.8  NEUTROABS 6.1  --   --   --   --   HGB 11.2* 7.0* 7.1* 8.3* 8.3*  HCT 34.6* 23.3* 23.5* 27.9* 26.9*  MCV 88.3 90.3 90.7  --  87.6  PLT <5* <5* <5*  --  <5*   Basic Metabolic Panel:  Recent Labs Lab 07/08/16 2058 07/09/16 0528 07/10/16 0357 07/11/16 0353  NA 134*  --  135 136  K 3.7  --  4.2 3.5  CL 85*  --  88* 87*  CO2 37*  --  40* 43*  GLUCOSE 206*  --  192* 165*  BUN 32*  --  25* 19  CREATININE 1.23  --  0.85 0.79  CALCIUM 8.3*  --  8.1* 8.2*  MG  --  1.0* 1.7 1.5*   GFR: Estimated Creatinine Clearance: 86.3 mL/min (by C-G formula based on SCr of 0.79 mg/dL). Liver Function Tests:  Recent  Labs Lab 07/08/16 2058  AST 24  ALT 13*  ALKPHOS 71  BILITOT 0.4  PROT 7.0  ALBUMIN 2.7*   No results for input(s): LIPASE, AMYLASE in the last 168 hours. No results for input(s): AMMONIA in the last 168 hours. Coagulation Profile:  Recent Labs Lab 07/09/16  0536  INR 1.16   Cardiac Enzymes:  Recent Labs Lab 07/08/16 2058 07/09/16 0535 07/09/16 1722  TROPONINI <0.03 <0.03 <0.03   BNP (last 3 results)  Recent Labs  02/17/16 0948 05/05/16 1450  PROBNP 40.0 17.0   HbA1C:  Recent Labs  07/09/16 0536  HGBA1C 6.6*   CBG:  Recent Labs Lab 07/10/16 0800 07/10/16 1213 07/10/16 1648 07/10/16 2141 07/11/16 0814  GLUCAP 195* 297* 142* 230* 201*   Lipid Profile:  Recent Labs  07/09/16 0526  CHOL 126  HDL 41  LDLCALC 63  TRIG 110  CHOLHDL 3.1   Thyroid Function Tests:  Recent Labs  07/09/16 0526 07/09/16 0528  TSH 5.003*  --   FREET4  --  1.39*   Anemia Panel: No results for input(s): VITAMINB12, FOLATE, FERRITIN, TIBC, IRON, RETICCTPCT in the last 72 hours. Urine analysis:    Component Value Date/Time   COLORURINE YELLOW 06/10/2016 1155   APPEARANCEUR CLEAR 06/10/2016 1155   LABSPEC 1.010 06/10/2016 1155   PHURINE 5.0 06/10/2016 1155   GLUCOSEU NEGATIVE 06/10/2016 1155   HGBUR MODERATE (A) 06/10/2016 1155   BILIRUBINUR NEGATIVE 06/10/2016 1155   KETONESUR NEGATIVE 06/10/2016 1155   PROTEINUR NEGATIVE 06/10/2016 1155   UROBILINOGEN 1.0 11/22/2008 1922   NITRITE NEGATIVE 06/10/2016 1155   LEUKOCYTESUR LARGE (A) 06/10/2016 1155   Sepsis Labs: @LABRCNTIP (procalcitonin:4,lacticidven:4) ) Recent Results (from the past 240 hour(s))  MRSA PCR Screening     Status: None   Collection Time: 07/09/16  4:17 PM  Result Value Ref Range Status   MRSA by PCR NEGATIVE NEGATIVE Final    Comment:        The GeneXpert MRSA Assay (FDA approved for NASAL specimens only), is one component of a comprehensive MRSA colonization surveillance program. It  is not intended to diagnose MRSA infection nor to guide or monitor treatment for MRSA infections.          Radiology Studies: Dg Chest Port 1 View  Result Date: 07/11/2016 CLINICAL DATA:  Follow-up pleural effusions EXAM: PORTABLE CHEST 1 VIEW COMPARISON:  PA and lateral chest x-ray of July 08, 2016 FINDINGS: There remain moderate-sized bilateral pleural effusions. The hemidiaphragms are obscured. The lower aspects of the heart border are obscured. The pulmonary vascularity is mildly engorged. There is calcification in the wall of the aortic arch. The trachea is midline. IMPRESSION: Persistent moderate-sized bilateral pleural effusions. Mild pulmonary vascular congestion consistent with low-grade CHF. Thoracic aortic atherosclerosis. Electronically Signed   By: David  Martinique M.D.   On: 07/11/2016 08:14      Scheduled Meds: . sodium chloride   Intravenous Once  . aminocaproic acid  1 g Oral Q8H  . amiodarone  150 mg Intravenous Once  . atorvastatin  40 mg Oral q1800  . budesonide (PULMICORT) nebulizer solution  0.25 mg Nebulization BID  . furosemide  40 mg Intravenous TID  . insulin aspart  0-5 Units Subcutaneous QHS  . insulin aspart  0-9 Units Subcutaneous TID WC  . insulin glargine  22 Units Subcutaneous Daily  . ipratropium  0.5 mg Nebulization TID  . levalbuterol  1.25 mg Nebulization TID  . loratadine  10 mg Oral Daily  . multivitamin  15 mL Oral Daily  . pantoprazole  40 mg Oral Daily  . predniSONE  5 mg Oral Q breakfast   Continuous Infusions: . amiodarone 30 mg/hr (07/11/16 1000)  . diltiazem (CARDIZEM) infusion 12.5 mg/hr (07/11/16 1000)     LOS: 2 days    Time  spent in minutes: 59    Campbell [place], MD [facility] Pager: [email or web address] Password [facility] 07/11/2016, 10:21 AM

## 2016-07-11 NOTE — Progress Notes (Signed)
Progress Note  Patient Name: [name] Date of Encounter: 07/11/2016  Primary Cardiologist: Dr Cyril Mourning  Subjective   Pt back in AF with RVR, tolerating well. More SOB when in afib. Possible splenectomy on Thursday, hopes he will be ready. Wonders if he can get thoracentesis also.  Inpatient Medications    Scheduled Meds: . sodium chloride   Intravenous Once  . aminocaproic acid  1 g Oral Q8H  . amiodarone  150 mg Intravenous Once  . atorvastatin  40 mg Oral q1800  . budesonide (PULMICORT) nebulizer solution  0.25 mg Nebulization BID  . furosemide  40 mg Intravenous TID  . insulin aspart  0-5 Units Subcutaneous QHS  . insulin aspart  0-9 Units Subcutaneous TID WC  . insulin glargine  22 Units Subcutaneous Daily  . ipratropium  0.5 mg Nebulization TID  . levalbuterol  1.25 mg Nebulization TID  . loratadine  10 mg Oral Daily  . multivitamin  15 mL Oral Daily  . pantoprazole  40 mg Oral Daily  . predniSONE  5 mg Oral Q breakfast   Continuous Infusions: . amiodarone 30 mg/hr (07/11/16 0800)  . diltiazem (CARDIZEM) infusion 12.5 mg/hr (07/11/16 0810)   PRN Meds: acetaminophen, ALPRAZolam, chlorpheniramine-HYDROcodone, levalbuterol, ondansetron (ZOFRAN) IV, oxymetazoline, zolpidem   Vital Signs    Vitals:   07/11/16 0417 07/11/16 0600 07/11/16 0700 07/11/16 0800  BP:  (!) 102/51 (!) 98/40 (!) 107/45  Pulse:  72 76 85  Resp:  19 (!) 27 (!) 21  Temp:    98.3 F (36.8 C)  TempSrc:      SpO2:  98% 98% 91%  Weight: 196 lb 10.4 oz (89.2 kg)     Height:        Intake/Output Summary (Last 24 hours) at 07/11/16 0951 Last data filed at 07/11/16 0837  Gross per 24 hour  Intake          1560.13 ml  Output              700 ml  Net           860.13 ml   Filed Weights   07/09/16 1600 07/10/16 0500 07/11/16 0417  Weight: 195 lb 1.7 oz (88.5 kg) 197 lb 8.5 oz (89.6 kg) 196 lb 10.4 oz (89.2 kg)    Telemetry    AF with RVR- up to140-160, in/out overnight, in  atrial fib 110s-120s since approx 9:15 am - Personally Reviewed  ECG     03/11 AF with RVR vs PSVT- Personally Reviewed  Physical Exam   GEN: mild respiratory distress.  Pale, no active nose bleed Neck: No JVD seen but difficult to assess 2nd lines Cardiac: irregularly irregular rhythm with increased VR Respiratory: decreased breath sounds, no wheezing GI: Soft, nontender, non-distended  MS: 1+ LE edema; No deformity. Neuro:  Nonfocal  Psych: Normal affect   Labs    Chemistry  Recent Labs Lab 07/08/16 2058 07/10/16 0357 07/11/16 0353  NA 134* 135 136  K 3.7 4.2 3.5  CL 85* 88* 87*  CO2 37* 40* 43*  GLUCOSE 206* 192* 165*  BUN 32* 25* 19  CREATININE 1.23 0.85 0.79  CALCIUM 8.3* 8.1* 8.2*  PROT 7.0  --   --   ALBUMIN 2.7*  --   --   AST 24  --   --   ALT 13*  --   --   ALKPHOS 71  --   --   BILITOT 0.4  --   --  GFRNONAA 56* >60 >60  GFRAA >60 >60 >60  ANIONGAP 12 7 6      Hematology  Recent Labs Lab 07/10/16 0357 07/10/16 0753 07/10/16 1630 07/11/16 0353  WBC 10.2 9.8  --  9.8  RBC 2.58* 2.59*  --  3.07*  HGB 7.0* 7.1* 8.3* 8.3*  HCT 23.3* 23.5* 27.9* 26.9*  MCV 90.3 90.7  --  87.6  MCH 27.1 27.4  --  27.0  MCHC 30.0 30.2  --  30.9  RDW 15.0 15.0  --  15.9*  PLT <5* <5*  --  <5*    Cardiac Enzymes  Recent Labs Lab 07/08/16 2058 07/09/16 0535 07/09/16 1722  TROPONINI <0.03 <0.03 <0.03   No results for input(s): TROPIPOC in the last 168 hours.   BNP  Recent Labs Lab 07/08/16 2308  BNP 113.8*     Radiology    Dg Chest Port 1 View  Result Date: 07/11/2016 CLINICAL DATA:  Follow-up pleural effusions EXAM: PORTABLE CHEST 1 VIEW COMPARISON:  PA and lateral chest x-ray of July 08, 2016 FINDINGS: There remain moderate-sized bilateral pleural effusions. The hemidiaphragms are obscured. The lower aspects of the heart border are obscured. The pulmonary vascularity is mildly engorged. There is calcification in the wall of the aortic arch. The  trachea is midline. IMPRESSION: Persistent moderate-sized bilateral pleural effusions. Mild pulmonary vascular congestion consistent with low-grade CHF. Thoracic aortic atherosclerosis. Electronically Signed   By: David  Martinique M.D.   On: 07/11/2016 08:14    Cardiac Studies   Echo 07/09/16- Study Conclusions - Left ventricle: The cavity size was normal. Wall thickness was   increased in a pattern of mild LVH. Systolic function was normal.   The estimated ejection fraction was in the range of 60% to 65%.   Although no diagnostic regional wall motion abnormality was   identified, this possibility cannot be completely excluded on the   basis of this study. Features are consistent with a pseudonormal   left ventricular filling pattern, with concomitant abnormal   relaxation and increased filling pressure (grade 2 diastolic   dysfunction). - Mitral valve: Calcified annulus. Mildly calcified leaflets .   There was trivial regurgitation. - Right ventricle: The cavity size was mildly dilated. - Right atrium: Central venous pressure (est): 3 mm Hg. - Atrial septum: No defect or patent foramen ovale was identified. - Tricuspid valve: There was trivial regurgitation. - Pericardium, extracardiac: There was a right pleural effusion.  Impressions:  - Overall limited images. Mild LVH with LVEF 60-65%. Probable grade   2 diastolic dysfunction. Calcified mitral annulus with trivial   mitral regurgitation. Aortic valve is moderately calcified and   not well seen. Right ventricle appears mildly dilated. Trivial   tricuspid regurgitation. Right pleural effusion is evident.  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 Since admission PO Amiodarone added (this worked in Feb 2018) as well as IV Diltiazem. Not a candidate for anticoagulation with undetectable PTLs and active bleeding.   Assessment  & Plan    1. AF with RVR vs PSVT- Telemetry is AF with RVR this morning. Tolerating moderately well. He is on IV Amiodarone (despite national shortage of IV Amiodarone). Will give another bolus  2. Thrombocytopenia with active bleeding- Pt being followed by Hematology- streroids increased - for splenectomy later this week, Dr Barry Dienes is to see him  3. Blood loss anemia- Hgb dropped from  11.2 to 7. Transfused   4. Pleural effusions- Recurrent pleural effusions this admission - I/O positive and wt not changed, but no output recorded 7a-7p yesterday - pt states he has not put out much urine on this Lasix dose - However, SBP 90s at times, need standing weights if possible, continue to follow  Plan- The pt has uncontrolled AF and is at high risk for decompensation.Continue IV Amiodarone with additional loading.    Signed, Lenoard Aden  07/11/2016, 9:51 AM    Patient examined chart reviewed spent 35 minutes with over 50% counseling patient and family on afib, effusion and ITP with need for surgery. Would transfuse PLT;s and plan bilateral thoracentesis day before surgery to help with intubation/extubation And aeration post op. Continue amiodarone iv and can use esmolol intraop for rate control if needed EF is normal by echo Exam with decreased BS from effusions bilaterally , pale with LE edema continue diuresis   Jenkins Rouge

## 2016-07-12 ENCOUNTER — Inpatient Hospital Stay (HOSPITAL_COMMUNITY): Payer: Medicare HMO

## 2016-07-12 DIAGNOSIS — R0602 Shortness of breath: Secondary | ICD-10-CM

## 2016-07-12 DIAGNOSIS — I9589 Other hypotension: Secondary | ICD-10-CM

## 2016-07-12 DIAGNOSIS — J9621 Acute and chronic respiratory failure with hypoxia: Secondary | ICD-10-CM

## 2016-07-12 LAB — CBC WITH DIFFERENTIAL/PLATELET
BASOS ABS: 0 10*3/uL (ref 0.0–0.1)
BASOS ABS: 0 10*3/uL (ref 0.0–0.1)
BASOS PCT: 0 %
Basophils Relative: 0 %
EOS PCT: 1 %
Eosinophils Absolute: 0 10*3/uL (ref 0.0–0.7)
Eosinophils Absolute: 0.1 10*3/uL (ref 0.0–0.7)
Eosinophils Relative: 0 %
HCT: 30.4 % — ABNORMAL LOW (ref 39.0–52.0)
HEMATOCRIT: 25.7 % — AB (ref 39.0–52.0)
Hemoglobin: 7.9 g/dL — ABNORMAL LOW (ref 13.0–17.0)
Hemoglobin: 9.3 g/dL — ABNORMAL LOW (ref 13.0–17.0)
LYMPHS PCT: 10 %
LYMPHS PCT: 17 %
Lymphs Abs: 1 10*3/uL (ref 0.7–4.0)
Lymphs Abs: 1.5 10*3/uL (ref 0.7–4.0)
MCH: 26.7 pg (ref 26.0–34.0)
MCH: 26.6 pg (ref 26.0–34.0)
MCHC: 30.7 g/dL (ref 30.0–36.0)
MCHC: 30.6 g/dL (ref 30.0–36.0)
MCV: 86.8 fL (ref 78.0–100.0)
MCV: 87.1 fL (ref 78.0–100.0)
Monocytes Absolute: 0.6 10*3/uL (ref 0.1–1.0)
Monocytes Absolute: 0.6 10*3/uL (ref 0.1–1.0)
Monocytes Relative: 6 %
Monocytes Relative: 7 %
NEUTROS ABS: 8 10*3/uL — AB (ref 1.7–7.7)
NEUTROS ABS: 6.7 10*3/uL (ref 1.7–7.7)
Neutrophils Relative %: 84 %
Neutrophils Relative %: 76 %
PLATELETS: 45 10*3/uL — AB (ref 150–400)
PLATELETS: 43 10*3/uL — AB (ref 150–400)
RBC: 2.96 MIL/uL — AB (ref 4.22–5.81)
RBC: 3.49 MIL/uL — AB (ref 4.22–5.81)
RDW: 15.2 % (ref 11.5–15.5)
RDW: 14.9 % (ref 11.5–15.5)
WBC: 9.6 10*3/uL (ref 4.0–10.5)
WBC: 8.8 10*3/uL (ref 4.0–10.5)

## 2016-07-12 LAB — COMPREHENSIVE METABOLIC PANEL
ALT: 14 U/L — ABNORMAL LOW (ref 17–63)
AST: 19 U/L (ref 15–41)
Albumin: 2.8 g/dL — ABNORMAL LOW (ref 3.5–5.0)
Alkaline Phosphatase: 75 U/L (ref 38–126)
Anion gap: 8 (ref 5–15)
BUN: 18 mg/dL (ref 6–20)
CO2: 44 mmol/L — ABNORMAL HIGH (ref 22–32)
Calcium: 8.7 mg/dL — ABNORMAL LOW (ref 8.9–10.3)
Chloride: 85 mmol/L — ABNORMAL LOW (ref 101–111)
Creatinine, Ser: 0.78 mg/dL (ref 0.61–1.24)
GFR calc Af Amer: >60 mL/min (ref >60)
GFR calc non Af Amer: >60 mL/min (ref >60)
Glucose, Bld: 189 mg/dL — ABNORMAL HIGH (ref 65–99)
Potassium: 3.5 mmol/L (ref 3.5–5.1)
Sodium: 137 mmol/L (ref 135–145)
Total Bilirubin: 0.5 mg/dL (ref 0.3–1.2)
Total Protein: 7 g/dL (ref 6.5–8.1)

## 2016-07-12 LAB — UIFE/LIGHT CHAINS/TP QN, 24-HR UR
% BETA, Urine: 38.9 %
ALBUMIN, U: 27 %
ALPHA 1 URINE: 12.1 %
Alpha 2, Urine: 22.1 %
Free Kappa/Lambda Ratio: 16.3 — ABNORMAL HIGH (ref 2.04–10.37)
Free Lambda Lt Chains,Ur: 10 mg/L — ABNORMAL HIGH (ref 0.24–6.66)
Free Lt Chn Excr Rate: 163 mg/L — ABNORMAL HIGH (ref 1.35–24.19)
GAMMA GLOBULIN URINE: 0 %
TOTAL PROTEIN, URINE-UR/DAY: 129 mg/(24.h) (ref 30–150)
TOTAL VOLUME: 1700
Total Protein, Urine: 7.6 mg/dL

## 2016-07-12 LAB — GLUCOSE, CAPILLARY
GLUCOSE-CAPILLARY: 227 mg/dL — AB (ref 65–99)
Glucose-Capillary: 186 mg/dL — ABNORMAL HIGH (ref 65–99)
Glucose-Capillary: 160 mg/dL — ABNORMAL HIGH (ref 65–99)

## 2016-07-12 LAB — MAGNESIUM: Magnesium: 1.4 mg/dL — ABNORMAL LOW (ref 1.7–2.4)

## 2016-07-12 LAB — PHOSPHORUS: Phosphorus: 3 mg/dL (ref 2.5–4.6)

## 2016-07-12 MED ORDER — SODIUM CHLORIDE 0.9 % IV SOLN
2.0000 g/h | Freq: Once | INTRAVENOUS | Status: AC
  Administered 2016-07-12: 2 g/h via INTRAVENOUS
  Filled 2016-07-12: qty 20

## 2016-07-12 MED ORDER — MAGNESIUM OXIDE 400 (241.3 MG) MG PO TABS
400.0000 mg | ORAL_TABLET | Freq: Two times a day (BID) | ORAL | Status: DC
  Administered 2016-07-12 – 2016-07-13 (×3): 400 mg via ORAL
  Filled 2016-07-12 (×3): qty 1

## 2016-07-12 MED ORDER — SODIUM CHLORIDE 0.9 % IV SOLN
2.0000 g/h | Freq: Four times a day (QID) | INTRAVENOUS | Status: AC
  Administered 2016-07-12 – 2016-07-14 (×7): 2 g/h via INTRAVENOUS
  Filled 2016-07-12 (×8): qty 20

## 2016-07-12 MED ORDER — SODIUM CHLORIDE 0.9 % IV SOLN: Freq: Once | INTRAVENOUS | Status: DC

## 2016-07-12 MED ORDER — SODIUM CHLORIDE 0.9 % IV SOLN: 2.0000 g/h | Freq: Four times a day (QID) | INTRAVENOUS | Status: DC

## 2016-07-12 MED ORDER — POTASSIUM CHLORIDE CRYS ER 20 MEQ PO TBCR
40.0000 meq | EXTENDED_RELEASE_TABLET | Freq: Once | ORAL | Status: AC
  Administered 2016-07-12: 40 meq via ORAL
  Filled 2016-07-12: qty 2

## 2016-07-12 MED ORDER — POTASSIUM CHLORIDE CRYS ER 20 MEQ PO TBCR
20.0000 meq | EXTENDED_RELEASE_TABLET | Freq: Every day | ORAL | Status: DC
  Administered 2016-07-13 – 2016-07-15 (×2): 20 meq via ORAL
  Filled 2016-07-12 (×2): qty 1

## 2016-07-12 NOTE — Procedures (Signed)
Ultrasound-guided therapeutic left thoracentesis performed yielding 1.2 liters of bloody fluid. No immediate complications. Follow-up chest x-ray pending.  Procedure terminated early secondary to hypotension.  BP stabilized with adminstration of platelets, but then quickly fell again once these were complete.  He does not have other fluids ordered etc so the procedure was stopped.  This was discussed with ordering provider, Dr. Barry Dienes.  The right side was unable to be done as well.  [name] E 10:40 AM 07/12/2016

## 2016-07-12 NOTE — Progress Notes (Signed)
PROGRESS NOTE    [name]  POE:[id number] DOB: 07/04/1940 DOA: 07/08/2016 PCP: [name], MD  Brief Narrative:  [name] is a 76 y.o. male with medical history significant of hypertension, hyperlipidemia, diabetes mellitus, COPD, GERD, idiopathic thrombocytopenia, bilateral pleural effusion, bronchiectasis, PSVT, dCHF, who presents with shortness of breath and bilateral leg edema.  He is on treatment by Dr Nadara Eaton for his ITP and was recently admitted 2/10-2/20 with a large pleural effusion needing thoracentesis, SVT and diuresed with improvement in symptoms. Evaluted by cardiology, Pulmonary and Oncology during that visit.   Returns for HR >150 noted while he was checking Vitals at home. ER performed electrical cardioverstion due to concomitant complaint of dyspnea, then placed him on a Cardizem infusion and admitted to SDU. Has severe refractory ITP so Oncology consulted and recommended Splenectomy so General Surgery consulted and likely to proceed in Am. Patient is s/p Left Thoracentesis today.   Assessment & Plan:   Principal Problem:   Paroxysmal SVT (supraventricular tachycardia) (HCC) Active Problems:   COPD  GOLD III    Obstructive bronchiectasis (HCC)   Pleural effusion   Chronic diastolic CHF (congestive heart failure) (HCC)   Diabetes mellitus without complication (HCC)   HLD (hyperlipidemia)   GERD (gastroesophageal reflux disease)   Acute on chronic diastolic CHF (congestive heart failure) (HCC)   Acute on chronic respiratory failure with hypoxia (HCC)   SVT (supraventricular tachycardia) (HCC)   Atrial fibrillation with RVR (HCC)   Chronic ITP (idiopathic thrombocytopenia) (HCC)  SVT  At home and in ER Paroxysmal A-fib with RVR subsequently - occurred during Feb admission as well - s/p electrical cardioversion in ER following by Cardizem infusion - oral Amio started by cardiology on 3/11 - was on Amio at one point during last admission but this  was stopped prior to discharge- he does have significant COPD per pulm notes - replacing electrolytes aggressively - 3/12- cardiology changed Oral Amio to IV 3/13- HR In 150s- per RN it was in 77s when Cardizem infusion was a 12.5- it was then decreased to 10 and HR when up- now increased to 12.5 again-   -Cardiology Following and recommending continuing Amiodarone and Diltiazem gtts    Abnormal thyroid functions - TSH elevated at 5.003- F T4 elevated at 1.39 - doubt this mild elevation is causing SVT - TSH was normal on 05/05/16 and 06/11/16 - now on Amiodarone gtt as well which can result in further hyperthyroidism - will recommend these be rechecked in 3-4 wks  B/l pleural effusions (s/p Left Thoracentesis draining 1.2 Liters of bloody fluid), dCHF- chronic, 2+ pedal edema - effusions were present on last admission as well and are not new - L thoracentesis done on 2/14 with 1.5 L dark bloody fluid removed; Repeat Left Thoracentesis done today showed 1.2 Liters of Bloody Fluid - 3/11 > ECHO shows grade 2 dCHF and normal EF - on Bumex at home - has not been taking his Losartan due to low BP - was due to see CHF clinic on 3/12 but ended up in the [facility] - Cardiology consulted and Discontinued Lasix per Cardiology note -S/p Left Thoracentesis done by IR after platelet infusions   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 hypercarbia  - has been using Proair, 10 mg Prednisone daily, Tussionex at bedtime and 3 L O2 continuously - will use Xopenex TID in hospital PRN only due to SVT - added Pulmicort to control congestion- cont Atovent nebs - follow for worsening fibrosis with  Amiodarone  ITP  - reviewed Dr Geralyn Flash notes - s/p IVIG infusions, rituximab, cyclophosphamide -  3/12 per Dr [name]  stopped Cyclophosphamide and Prednisone and surgery consult for splenectomy - I  will wean prednisone as he has been taking it for 4 mo,we are diuresing him aggressively and he in on Amio  and Cardizem infusions all which can cause BP to drop-  Prednisone 5mg  today for a few days -s/p Transfusions of 4 pools of Platelets -Repeat Platelet Count was 45 -Will undergo transfusion of 6 units of platelets per Oncology Recc's - Dr Barry Dienes to evaluate for splenectomy and surgery scheduled for 10:45 AM on Thrusday  Anemia, s/p Transfusion of 2 unit of pRBC - acute-- cause unknown - baseline is 8- Hb of 11 on admission was likely inaccurate  - given 1 U PRBC 3/12- Hb improved to 8.3; Was given another unit of pRBC today -Repeat CBC showed Hb/Hct of 7.9/25.7 prior to transfusion of another unit of Blood -Will need to Repeat CBC in AM  Nose Bleeds - C/w Amicar 2 g/hr IV q6h x 48 hours - C/w Oxymetazoline 2 spray each nare BID daily    Diabetes mellitus without complication - Y8M 6.6 - C/w Sensitive Novolog SSI AC and HS - C/w Lantus 22 units sq Daily  - CBG's ranging from 146-233  DVT prophylaxis: SCDs Code Status: FULL CODE Family Communication: No Family present at bedside Disposition Plan: Likely SNF; Patient going for Surgery in AM  Consultants:   Cardiology  Oncology  General Surgery  Interventional Radiology   Procedures: Left Thoracentesis yeilding bloody 1.2 Liters   Antimicrobials:  Anti-infectives    None     Subjective: Seen and examined and was extremely SOB even doing the slightest movements. States he has no CP but legs are swollen and states he has been swelling more. No Nausea or Vomiting. No other concerns or complaints at this time.   Objective: Vitals:   07/12/16 0400 07/12/16 0418 07/12/16 0500 07/12/16 0600  BP: (!) 130/51  129/60 (!) 128/59  Pulse: 73  80 80  Resp: 20  (!) 22 17  Temp:      TempSrc:      SpO2: 95%  94% (!) 89%  Weight:  92.8 kg (204 lb 9.4 oz)    Height:        Intake/Output Summary (Last 24 hours) at 07/12/16 0717 Last data filed at 07/12/16 0600  Gross per 24 hour  Intake           1566.6 ml  Output              1110 ml  Net            456.6 ml   Filed Weights   07/10/16 0500 07/11/16 0417 07/12/16 0418  Weight: 89.6 kg (197 lb 8.5 oz) 89.2 kg (196 lb 10.4 oz) 92.8 kg (204 lb 9.4 oz)   Examination: Physical Exam:  Constitutional: Appears Dyspneic at rest but in no acute distress Eyes: Lids and conjunctivae normal, sclerae anicteric  ENMT: External Ears, Nares appear to have dry blood.  Neck: Appears normal, supple, no cervical masses, normal ROM, no appreciable thyromegaly Respiratory: Diminished to auscultation bilaterally with L being worse than R, no wheezing, rales but had some rhonchi. Slightly increased respiratory effort. No accessory muscle use.  Cardiovascular: Irregularly Irregular, no murmurs / rubs / gallops. S1 and S2 auscultated. 2+ Lower Extremity Edema Abdomen: Soft, non-tender, non-distended. No masses palpated. No appreciable  hepatosplenomegaly. Bowel sounds positive x4.  GU: Deferred. Musculoskeletal: No clubbing / cyanosis of digits/nails. No joint deformity upper and lower extremities. Skin: No rashes, lesions, ulcers on limited skin evaluation. No induration; Warm and dry.  Neurologic: CN 2-12 grossly intact with no focal deficits. Romberg sign cerebellar reflexes not assessed.  Psychiatric: Normal judgment and insight. Alert and oriented x 3. Normal mood and appropriate affect.   Data Reviewed: I have personally reviewed following labs and imaging studies  CBC:  Recent Labs Lab 07/08/16 2058 07/10/16 0357 07/10/16 0753 07/10/16 1630 07/11/16 0353  WBC 9.1 10.2 9.8  --  9.8  NEUTROABS 6.1  --   --   --   --   HGB 11.2* 7.0* 7.1* 8.3* 8.3*  HCT 34.6* 23.3* 23.5* 27.9* 26.9*  MCV 88.3 90.3 90.7  --  87.6  PLT <5* <5* <5*  --  <5*   Basic Metabolic Panel:  Recent Labs Lab 07/08/16 2058 07/09/16 0528 07/10/16 0357 07/11/16 0353  NA 134*  --  135 136  K 3.7  --  4.2 3.5  CL 85*  --  88* 87*  CO2 37*  --  40* 43*  GLUCOSE 206*  --  192* 165*  BUN  32*  --  25* 19  CREATININE 1.23  --  0.85 0.79  CALCIUM 8.3*  --  8.1* 8.2*  MG  --  1.0* 1.7 1.5*   GFR: Estimated Creatinine Clearance: 94.3 mL/min (by C-G formula based on SCr of 0.79 mg/dL). Liver Function Tests:  Recent Labs Lab 07/08/16 2058  AST 24  ALT 13*  ALKPHOS 71  BILITOT 0.4  PROT 7.0  ALBUMIN 2.7*   No results for input(s): LIPASE, AMYLASE in the last 168 hours. No results for input(s): AMMONIA in the last 168 hours. Coagulation Profile:  Recent Labs Lab 07/09/16 0536  INR 1.16   Cardiac Enzymes:  Recent Labs Lab 07/08/16 2058 07/09/16 0535 07/09/16 1722  TROPONINI <0.03 <0.03 <0.03   BNP (last 3 results)  Recent Labs  02/17/16 0948 05/05/16 1450  PROBNP 40.0 17.0   HbA1C: No results for input(s): HGBA1C in the last 72 hours. CBG:  Recent Labs Lab 07/11/16 0814 07/11/16 1248 07/11/16 1633 07/11/16 2039 07/11/16 2155  GLUCAP 201* 257* 146* 219* 233*   Lipid Profile: No results for input(s): CHOL, HDL, LDLCALC, TRIG, CHOLHDL, LDLDIRECT in the last 72 hours. Thyroid Function Tests: No results for input(s): TSH, T4TOTAL, FREET4, T3FREE, THYROIDAB in the last 72 hours. Anemia Panel: No results for input(s): VITAMINB12, FOLATE, FERRITIN, TIBC, IRON, RETICCTPCT in the last 72 hours. Sepsis Labs: No results for input(s): PROCALCITON, LATICACIDVEN in the last 168 hours.  Recent Results (from the past 240 hour(s))  MRSA PCR Screening     Status: None   Collection Time: 07/09/16  4:17 PM  Result Value Ref Range Status   MRSA by PCR NEGATIVE NEGATIVE Final    Comment:        The GeneXpert MRSA Assay (FDA approved for NASAL specimens only), is one component of a comprehensive MRSA colonization surveillance program. It is not intended to diagnose MRSA infection nor to guide or monitor treatment for MRSA infections.     Radiology Studies: Dg Chest Port 1 View  Result Date: 07/11/2016 CLINICAL DATA:  Follow-up pleural effusions  EXAM: PORTABLE CHEST 1 VIEW COMPARISON:  PA and lateral chest x-ray of July 08, 2016 FINDINGS: There remain moderate-sized bilateral pleural effusions. The hemidiaphragms are obscured. The lower aspects of  the heart border are obscured. The pulmonary vascularity is mildly engorged. There is calcification in the wall of the aortic arch. The trachea is midline. IMPRESSION: Persistent moderate-sized bilateral pleural effusions. Mild pulmonary vascular congestion consistent with low-grade CHF. Thoracic aortic atherosclerosis. Electronically Signed   By: David  Martinique M.D.   On: 07/11/2016 08:14   Scheduled Meds: . sodium chloride   Intravenous Once  . sodium chloride   Intravenous Once  . aminocaproic acid  1 g Oral Q8H  . amiodarone  150 mg Intravenous Once  . atorvastatin  40 mg Oral q1800  . budesonide (PULMICORT) nebulizer solution  0.25 mg Nebulization BID  . carvedilol  3.125 mg Oral BID WC  . furosemide  40 mg Intravenous TID  . insulin aspart  0-5 Units Subcutaneous QHS  . insulin aspart  0-9 Units Subcutaneous TID WC  . insulin glargine  22 Units Subcutaneous Daily  . ipratropium  0.5 mg Nebulization TID  . levalbuterol  1.25 mg Nebulization TID  . loratadine  10 mg Oral Daily  . multivitamin  15 mL Oral Daily  . pantoprazole  40 mg Oral Daily  . predniSONE  5 mg Oral Q breakfast   Continuous Infusions: . amiodarone 30 mg/hr (07/12/16 0100)  . diltiazem (CARDIZEM) infusion 6 mg/hr (07/12/16 5800)    LOS: 3 days   Kerney Elbe, DO [facility] Pager [phone]  If 7PM-7AM, please contact night-coverage [email or web address] Password [facility] 07/12/2016, 7:17 AM

## 2016-07-12 NOTE — Progress Notes (Addendum)
Progress Note  Patient Name: [name] Date of Encounter: 07/12/2016  Primary Cardiologist: Dr Bensimhon/Hilty  Subjective   SOB at rest.   Inpatient Medications    Scheduled Meds: . sodium chloride   Intravenous Once  . sodium chloride   Intravenous Once  . aminocaproic acid  1 g Oral Q8H  . amiodarone  150 mg Intravenous Once  . atorvastatin  40 mg Oral q1800  . budesonide (PULMICORT) nebulizer solution  0.25 mg Nebulization BID  . carvedilol  3.125 mg Oral BID WC  . furosemide  40 mg Intravenous TID  . insulin aspart  0-5 Units Subcutaneous QHS  . insulin aspart  0-9 Units Subcutaneous TID WC  . insulin glargine  22 Units Subcutaneous Daily  . ipratropium  0.5 mg Nebulization TID  . levalbuterol  1.25 mg Nebulization TID  . loratadine  10 mg Oral Daily  . multivitamin  15 mL Oral Daily  . pantoprazole  40 mg Oral Daily  . predniSONE  5 mg Oral Q breakfast   Continuous Infusions: . amiodarone 30 mg/hr (07/12/16 0100)  . diltiazem (CARDIZEM) infusion 6 mg/hr (07/12/16 0240)   PRN Meds: acetaminophen, ALPRAZolam, chlorpheniramine-HYDROcodone, levalbuterol, ondansetron (ZOFRAN) IV, oxymetazoline, zolpidem   Vital Signs    Vitals:   07/12/16 0400 07/12/16 0418 07/12/16 0500 07/12/16 0600  BP: (!) 130/51  129/60 (!) 128/59  Pulse: 73  80 80  Resp: 20  (!) 22 17  Temp:      TempSrc:      SpO2: 95%  94% (!) 89%  Weight:  204 lb 9.4 oz (92.8 kg)    Height:        Intake/Output Summary (Last 24 hours) at 07/12/16 0811 Last data filed at 07/12/16 0600  Gross per 24 hour  Intake           1527.4 ml  Output             1110 ml  Net            417.4 ml   Filed Weights   07/10/16 0500 07/11/16 0417 07/12/16 0418  Weight: 197 lb 8.5 oz (89.6 kg) 196 lb 10.4 oz (89.2 kg) 204 lb 9.4 oz (92.8 kg)    Telemetry    He converted to NSR 3 pm yesterday- Personally Reviewed  ECG     03/11 AF with RVR vs PSVT- Personally Reviewed  Physical Exam   GEN: mild  respiratory distress.  Pale, no active nose bleed Neck: No JVD seen but difficult to assess 2nd lines Cardiac: RRR no murmur Respiratory: decreased breath sounds, no wheezing, dullness to percussion 3/4 up bilateraly GI: Soft, nontender, non-distended  MS: 1+ LE edema; No deformity. Neuro:  Nonfocal  Psych: Normal affect   Labs    Chemistry  Recent Labs Lab 07/08/16 2058 07/10/16 0357 07/11/16 0353  NA 134* 135 136  K 3.7 4.2 3.5  CL 85* 88* 87*  CO2 37* 40* 43*  GLUCOSE 206* 192* 165*  BUN 32* 25* 19  CREATININE 1.23 0.85 0.79  CALCIUM 8.3* 8.1* 8.2*  PROT 7.0  --   --   ALBUMIN 2.7*  --   --   AST 24  --   --   ALT 13*  --   --   ALKPHOS 71  --   --   BILITOT 0.4  --   --   GFRNONAA 56* >60 >60  GFRAA >60 >60 >60  ANIONGAP 12 7 6  Hematology  Recent Labs Lab 07/10/16 0357 07/10/16 0753 07/10/16 1630 07/11/16 0353  WBC 10.2 9.8  --  9.8  RBC 2.58* 2.59*  --  3.07*  HGB 7.0* 7.1* 8.3* 8.3*  HCT 23.3* 23.5* 27.9* 26.9*  MCV 90.3 90.7  --  87.6  MCH 27.1 27.4  --  27.0  MCHC 30.0 30.2  --  30.9  RDW 15.0 15.0  --  15.9*  PLT <5* <5*  --  <5*    Cardiac Enzymes  Recent Labs Lab 07/08/16 2058 07/09/16 0535 07/09/16 1722  TROPONINI <0.03 <0.03 <0.03   No results for input(s): TROPIPOC in the last 168 hours.   BNP  Recent Labs Lab 07/08/16 2308  BNP 113.8*     Radiology    Dg Chest Port 1 View  Result Date: 07/11/2016 CLINICAL DATA:  Follow-up pleural effusions EXAM: PORTABLE CHEST 1 VIEW COMPARISON:  PA and lateral chest x-ray of July 08, 2016 FINDINGS: There remain moderate-sized bilateral pleural effusions. The hemidiaphragms are obscured. The lower aspects of the heart border are obscured. The pulmonary vascularity is mildly engorged. There is calcification in the wall of the aortic arch. The trachea is midline. IMPRESSION: Persistent moderate-sized bilateral pleural effusions. Mild pulmonary vascular congestion consistent with low-grade  CHF. Thoracic aortic atherosclerosis. Electronically Signed   By: David  Martinique M.D.   On: 07/11/2016 08:14    Cardiac Studies   Echo 07/09/16- Study Conclusions - Left ventricle: The cavity size was normal. Wall thickness was   increased in a pattern of mild LVH. Systolic function was normal.   The estimated ejection fraction was in the range of 60% to 65%.   Although no diagnostic regional wall motion abnormality was   identified, this possibility cannot be completely excluded on the   basis of this study. Features are consistent with a pseudonormal   left ventricular filling pattern, with concomitant abnormal   relaxation and increased filling pressure (grade 2 diastolic   dysfunction). - Mitral valve: Calcified annulus. Mildly calcified leaflets .   There was trivial regurgitation. - Right ventricle: The cavity size was mildly dilated. - Right atrium: Central venous pressure (est): 3 mm Hg. - Atrial septum: No defect or patent foramen ovale was identified. - Tricuspid valve: There was trivial regurgitation. - Pericardium, extracardiac: There was a right pleural effusion.  Impressions:  - Overall limited images. Mild LVH with LVEF 60-65%. Probable grade   2 diastolic dysfunction. Calcified mitral annulus with trivial   mitral regurgitation. Aortic valve is moderately calcified and   not well seen. Right ventricle appears mildly dilated. Trivial   tricuspid regurgitation. Right pleural effusion is evident.  Patient Profile     76 y.o. 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 Since admission his rate has been difficult to control but has converted to NSR with IV Amiodarone. Not a candidate for anticoagulation with undetectable PTLs and active bleeding requiring transfusion of blood and platelets. He has recurrent bilateral pleural effusions and is for thoracentesis.   Assessment & Plan      1. AF with RVR vs PSVT- Telemetry shows NSR as of 3 pm yesterday.   2. Thrombocytopenia with active bleeding- Pt being followed by Hematology- streroids increased - for splenectomy Thursday. Plts being transfused  3. Blood loss anemia- Hgb dropped from 11.2 to 7. Transfused 07/10/16  4. Pleural effusions- Recurrent pleural effusions this admission - I/O positive and  wt not changed  5. Diastolic dysfunction- EF 23-76% with LVH and grade 2 DD  Plan- He is at high risk for decompensation if he goes back into AF with RVR. Continue IV Diltiazem and Amiodarone for now- transition to po when able s/p splenectomy. He is for Plts this am then thoracentesis. Splenectomy Thursday.  Labs pending this am- keep K+ close to 4.0- (3.5 yesterday) and MG++ close to 1.8 (1.5 yesterday)will give PO K+ and MG++ this am.    Angelena Form, PA-C  07/12/2016, 8:11 AM    Patient examined chart reviewed. Spent over 35 minutes with half time direct counseling with patient and family regarding PAF and risk of surgery. He got hypotensive with thoracentesis and only had left side done Maintained NSR on iv amiodarone  Concerning that his BP fell with thoracentesis Suggest anesthesia see patient before surgery would benefit from [place] Also apparently an issue getting PLT;s from [place] Will hold iv diuretics for now.    Jenkins Rouge

## 2016-07-12 NOTE — Progress Notes (Signed)
Hematology Progress Note: S/P thoracentesis, sleeping now Hypotension post-procedure  Sinus rhythm Ext edema  Severe Refractory ITP: Patient needs a splenectomy and is being planned for tomorrow CBC    Component Value Date/Time   WBC 9.8 07/11/2016 0353   RBC 3.07 (L) 07/11/2016 0353   HGB 8.3 (L) 07/11/2016 0353   HGB 8.3 (L) 07/03/2016 1141   HCT 26.9 (L) 07/11/2016 0353   HCT 28.1 (L) 07/03/2016 1141   PLT <5 (LL) 07/11/2016 0353   PLT 5 (LL) 07/03/2016 1141   MCV 87.6 07/11/2016 0353   MCV 91.2 07/03/2016 1141   MCH 27.0 07/11/2016 0353   MCHC 30.9 07/11/2016 0353   RDW 15.9 (H) 07/11/2016 0353   RDW 15.2 (H) 07/03/2016 1141   LYMPHSABS 2.3 07/08/2016 2058   LYMPHSABS 1.4 07/03/2016 1141   MONOABS 0.6 07/08/2016 2058   MONOABS 0.8 07/03/2016 1141   EOSABS 0.1 07/08/2016 2058   EOSABS 0.0 07/03/2016 1141   BASOSABS 0.0 07/08/2016 2058   BASOSABS 0.0 07/03/2016 1141    Planning for Splenectomy: 1. Amicar 5 gm IV bolus and 2 gms q 6 hours X 48 hours 2. Get 6 units of platelets available for tomorrow  - 1 unit before surgery  - 1-2 units during surgery  - 2 units post -op 3 Hypotension: Off Lasix for now  will transfuse 1 unit of PRBC today 4. Bloody pleural effusion: 1.2 L removed Patients family fully understand the risks of the procedure including the risk of life threatening bleed. Unfortunately this is the best option to make his ITP better

## 2016-07-12 NOTE — Progress Notes (Signed)
Unit # [id number] 0 added via IV in L arm to infuse at 0850 with temp 98.3 106/56, HR 75 resp 32.  At 0905 98.3, 109/54, HR 74, resp 21.  Platelets finished infusing at 0950 with vitals 98.4, 87/36, HR 68, resp 18.  Unit # [id number] 3 added at 0950 via L arm with vitals 0950 98.4, 87/36, HR 68 resp 18, 15 min vitals 98.4, 76/35, HR 68, resp 23, in fusion of platelets complete at 1045 with vitals 98.3 93/34, HR 61, resp 20.  Unit # [id number] 3 added via L arm with vitals at 1046 98.3, 93/34, HR 61, resp 20, 15 min vitals at 1100 am 98.2, 92/46, HR 65, resp 24.  Ifusion of platelets finished at 1130 with vitals 98.2 93/47, HR 62, resp 16.  Unable to document under the blood flow sheet earlier.  Surya Folden Roselie Awkward RN

## 2016-07-12 NOTE — Progress Notes (Signed)
Per MD order pt.had an order to have 4 U of platelets transfused. After the platelets order was released to the blood bank, Blood admin flowsheet did not populate a row that showed "transfuse platelets".  RN went ahead and transfused 2 U of platelets. Remaining 2 U will be available in the morning from Blood bank center.  1st RN Huel Coventry                     2nd RN name-Emily Dimas Aguas  Consent was in the chart.Vitals taken within 30 min of transfusion start.After platelets obtained from blood bank, both RNs verified information on platelet bag & id band.  Transfusion started at 1am at 120 ml/hr. After 15 min, no reaction noted.  2 U platelets transfused. Transfusion complete at 3 pm. VS recorded.

## 2016-07-12 NOTE — Progress Notes (Signed)
Patient had blood consent signed and the first unit of platelets was checked by Lacinda Axon RN and Katherine Mantle RN, second unit of platelets was checked by Lacinda Axon RN and Oren Bracket RN, and third unit of platelets was checked by Lacinda Axon RN and Oren Bracket RN. Kalob Bergen Roselie Awkward RN

## 2016-07-13 ENCOUNTER — Encounter (HOSPITAL_COMMUNITY): Payer: Self-pay | Admitting: Certified Registered Nurse Anesthetist

## 2016-07-13 ENCOUNTER — Inpatient Hospital Stay (HOSPITAL_COMMUNITY): Payer: Medicare HMO

## 2016-07-13 ENCOUNTER — Inpatient Hospital Stay (HOSPITAL_COMMUNITY): Payer: Medicare HMO | Admitting: Anesthesiology

## 2016-07-13 ENCOUNTER — Encounter (HOSPITAL_COMMUNITY)
Admission: EM | Disposition: A | Discharge status: Skilled Nursing Facility | Payer: Self-pay | Source: Home / Self Care | Attending: Pulmonary Disease

## 2016-07-13 DIAGNOSIS — J9601 Acute respiratory failure with hypoxia: Secondary | ICD-10-CM

## 2016-07-13 HISTORY — PX: LAPAROSCOPIC SPLENECTOMY: SHX409

## 2016-07-13 LAB — CBC WITH DIFFERENTIAL/PLATELET
Basophils Absolute: 0 10*3/uL (ref 0.0–0.1)
Basophils Relative: 0 %
EOS PCT: 1 %
Eosinophils Absolute: 0.1 10*3/uL (ref 0.0–0.7)
HCT: 33.4 % — ABNORMAL LOW (ref 39.0–52.0)
Hemoglobin: 10.2 g/dL — ABNORMAL LOW (ref 13.0–17.0)
LYMPHS ABS: 0.8 10*3/uL (ref 0.7–4.0)
LYMPHS PCT: 8 %
MCH: 27 pg (ref 26.0–34.0)
MCHC: 30.5 g/dL (ref 30.0–36.0)
MCV: 88.4 fL (ref 78.0–100.0)
MONO ABS: 0.6 10*3/uL (ref 0.1–1.0)
MONOS PCT: 6 %
Neutro Abs: 8 10*3/uL — ABNORMAL HIGH (ref 1.7–7.7)
Neutrophils Relative %: 85 %
Platelets: 49 10*3/uL — ABNORMAL LOW (ref 150–400)
RBC: 3.78 MIL/uL — ABNORMAL LOW (ref 4.22–5.81)
RDW: 15.1 % (ref 11.5–15.5)
WBC: 9.5 10*3/uL (ref 4.0–10.5)

## 2016-07-13 LAB — BPAM PLATELET PHERESIS
Blood Product Expiration Date: 201803152359
Blood Product Expiration Date: 201803152359
Blood Product Expiration Date: 201803152359
Blood Product Expiration Date: 201803152359
ISSUE DATE / TIME: 201803140020
ISSUE DATE / TIME: 201803140835
ISSUE DATE / TIME: 201803140944
ISSUE DATE / TIME: 201803141038
Unit Type and Rh: 6200
Unit Type and Rh: 6200
Unit Type and Rh: 6200
Unit Type and Rh: 6200

## 2016-07-13 LAB — COMPREHENSIVE METABOLIC PANEL
ALBUMIN: 2.6 g/dL — AB (ref 3.5–5.0)
ALK PHOS: 78 U/L (ref 38–126)
ALT: 15 U/L — ABNORMAL LOW (ref 17–63)
AST: 18 U/L (ref 15–41)
Anion gap: 9 (ref 5–15)
BILIRUBIN TOTAL: 0.8 mg/dL (ref 0.3–1.2)
BUN: 15 mg/dL (ref 6–20)
CALCIUM: 8.7 mg/dL — AB (ref 8.9–10.3)
CO2: 43 mmol/L — ABNORMAL HIGH (ref 22–32)
Chloride: 86 mmol/L — ABNORMAL LOW (ref 101–111)
Creatinine, Ser: 0.75 mg/dL (ref 0.61–1.24)
GFR calc Af Amer: >60 mL/min (ref >60)
GFR calc non Af Amer: >60 mL/min (ref >60)
GLUCOSE: 225 mg/dL — AB (ref 65–99)
Potassium: 4 mmol/L (ref 3.5–5.1)
Sodium: 138 mmol/L (ref 135–145)
TOTAL PROTEIN: 6.7 g/dL (ref 6.5–8.1)

## 2016-07-13 LAB — PHOSPHORUS: Phosphorus: 3.6 mg/dL (ref 2.5–4.6)

## 2016-07-13 LAB — BLOOD GAS, ARTERIAL
Acid-Base Excess: 9.3 mmol/L — ABNORMAL HIGH (ref 0.0–2.0)
Bicarbonate: 36.7 mmol/L — ABNORMAL HIGH (ref 20.0–28.0)
DRAWN BY: 270211
FIO2: 1
MECHVT: 600 mL
O2 Saturation: 99.4 %
PEEP: 5 cmH2O
PO2 ART: 172 mmHg — AB (ref 83.0–108.0)
Patient temperature: 37
RATE: 14 resp/min
pCO2 arterial: 65.1 mmHg (ref 32.0–48.0)
pH, Arterial: 7.37 (ref 7.350–7.450)

## 2016-07-13 LAB — PREPARE PLATELET PHERESIS
Unit division: 0
Unit division: 0
Unit division: 0
Unit division: 0

## 2016-07-13 LAB — POCT I-STAT 7, (LYTES, BLD GAS, ICA,H+H)
ACID-BASE EXCESS: 20 mmol/L — AB (ref 0.0–2.0)
BICARBONATE: 46.4 mmol/L — AB (ref 20.0–28.0)
CALCIUM ION: 1.12 mmol/L — AB (ref 1.15–1.40)
HEMATOCRIT: 28 % — AB (ref 39.0–52.0)
Hemoglobin: 9.5 g/dL — ABNORMAL LOW (ref 13.0–17.0)
O2 Saturation: 99 %
PH ART: 7.451 — AB (ref 7.350–7.450)
POTASSIUM: 4.1 mmol/L (ref 3.5–5.1)
SODIUM: 135 mmol/L (ref 135–145)
TCO2: 48 mmol/L (ref 0–100)
pCO2 arterial: 66.6 mmHg (ref 32.0–48.0)
pO2, Arterial: 121 mmHg — ABNORMAL HIGH (ref 83.0–108.0)

## 2016-07-13 LAB — GLUCOSE, CAPILLARY
GLUCOSE-CAPILLARY: 158 mg/dL — AB (ref 65–99)
GLUCOSE-CAPILLARY: 160 mg/dL — AB (ref 65–99)
Glucose-Capillary: 194 mg/dL — ABNORMAL HIGH (ref 65–99)
Glucose-Capillary: 209 mg/dL — ABNORMAL HIGH (ref 65–99)
Glucose-Capillary: 171 mg/dL — ABNORMAL HIGH (ref 65–99)

## 2016-07-13 LAB — MAGNESIUM: Magnesium: 1.5 mg/dL — ABNORMAL LOW (ref 1.7–2.4)

## 2016-07-13 SURGERY — SPLENECTOMY, LAPAROSCOPIC
Anesthesia: General | Site: Abdomen

## 2016-07-13 MED ORDER — FENTANYL CITRATE (PF) 100 MCG/2ML IJ SOLN
INTRAMUSCULAR | Status: AC
  Filled 2016-07-13: qty 2

## 2016-07-13 MED ORDER — PREDNISONE 5 MG PO TABS
5.0000 mg | ORAL_TABLET | Freq: Every day | ORAL | Status: DC
  Administered 2016-07-15 – 2016-07-16 (×2): 5 mg
  Filled 2016-07-13 (×2): qty 1

## 2016-07-13 MED ORDER — FUROSEMIDE 10 MG/ML IJ SOLN
40.0000 mg | Freq: Once | INTRAMUSCULAR | Status: AC
  Administered 2016-07-13: 40 mg via INTRAVENOUS
  Filled 2016-07-13: qty 4

## 2016-07-13 MED ORDER — PHENYLEPHRINE HCL 10 MG/ML IJ SOLN
INTRAMUSCULAR | Status: DC | PRN
  Administered 2016-07-13: 80 ug via INTRAVENOUS

## 2016-07-13 MED ORDER — EVICEL 5 ML EX KIT
PACK | CUTANEOUS | Status: DC | PRN
  Administered 2016-07-13: 5 mL

## 2016-07-13 MED ORDER — MIDAZOLAM HCL 2 MG/2ML IJ SOLN
INTRAMUSCULAR | Status: AC
  Filled 2016-07-13: qty 2

## 2016-07-13 MED ORDER — EVICEL 5 ML EX KIT
PACK | Freq: Once | CUTANEOUS | Status: DC
  Filled 2016-07-13: qty 1

## 2016-07-13 MED ORDER — CEFAZOLIN SODIUM-DEXTROSE 2-3 GM-% IV SOLR
INTRAVENOUS | Status: DC | PRN
  Administered 2016-07-13: 2 g via INTRAVENOUS

## 2016-07-13 MED ORDER — CARVEDILOL 3.125 MG PO TABS
3.1250 mg | ORAL_TABLET | Freq: Two times a day (BID) | ORAL | Status: DC
  Administered 2016-07-15 – 2016-07-16 (×3): 3.125 mg
  Filled 2016-07-13 (×3): qty 1

## 2016-07-13 MED ORDER — SODIUM CHLORIDE 0.9 % IV SOLN
INTRAVENOUS | Status: DC | PRN
  Administered 2016-07-13 (×2): via INTRAVENOUS

## 2016-07-13 MED ORDER — FENTANYL CITRATE (PF) 100 MCG/2ML IJ SOLN
50.0000 ug | INTRAMUSCULAR | Status: DC | PRN
  Administered 2016-07-14 – 2016-07-19 (×18): 50 ug via INTRAVENOUS
  Filled 2016-07-13 (×15): qty 2

## 2016-07-13 MED ORDER — ALBUMIN HUMAN 5 % IV SOLN
INTRAVENOUS | Status: DC | PRN
  Administered 2016-07-13: 12:00:00 via INTRAVENOUS

## 2016-07-13 MED ORDER — LEVALBUTEROL HCL 1.25 MG/0.5ML IN NEBU
1.2500 mg | INHALATION_SOLUTION | Freq: Four times a day (QID) | RESPIRATORY_TRACT | Status: DC
  Administered 2016-07-13 – 2016-07-23 (×39): 1.25 mg via RESPIRATORY_TRACT
  Filled 2016-07-13 (×42): qty 0.5

## 2016-07-13 MED ORDER — BUPIVACAINE-EPINEPHRINE 0.25% -1:200000 IJ SOLN
INTRAMUSCULAR | Status: AC
  Filled 2016-07-13: qty 1

## 2016-07-13 MED ORDER — IPRATROPIUM BROMIDE 0.02 % IN SOLN
0.5000 mg | Freq: Four times a day (QID) | RESPIRATORY_TRACT | Status: DC
  Administered 2016-07-13 – 2016-07-23 (×39): 0.5 mg via RESPIRATORY_TRACT
  Filled 2016-07-13 (×41): qty 2.5

## 2016-07-13 MED ORDER — LIDOCAINE HCL 1 % IJ SOLN
INTRAMUSCULAR | Status: AC
  Filled 2016-07-13: qty 20

## 2016-07-13 MED ORDER — FENTANYL CITRATE (PF) 100 MCG/2ML IJ SOLN
INTRAMUSCULAR | Status: DC | PRN
  Administered 2016-07-13: 100 ug via INTRAVENOUS

## 2016-07-13 MED ORDER — LEVALBUTEROL HCL 0.63 MG/3ML IN NEBU
0.6300 mg | INHALATION_SOLUTION | RESPIRATORY_TRACT | Status: DC | PRN
  Filled 2016-07-13 (×2): qty 3

## 2016-07-13 MED ORDER — CEFAZOLIN SODIUM-DEXTROSE 2-4 GM/100ML-% IV SOLN
INTRAVENOUS | Status: AC
  Filled 2016-07-13: qty 100

## 2016-07-13 MED ORDER — MAGNESIUM SULFATE 2 GM/50ML IV SOLN
2.0000 g | Freq: Once | INTRAVENOUS | Status: AC
  Administered 2016-07-13: 2 g via INTRAVENOUS

## 2016-07-13 MED ORDER — CHLORHEXIDINE GLUCONATE 0.12% ORAL RINSE (MEDLINE KIT)
15.0000 mL | Freq: Two times a day (BID) | OROMUCOSAL | Status: DC
  Administered 2016-07-13 – 2016-07-20 (×15): 15 mL via OROMUCOSAL

## 2016-07-13 MED ORDER — PHENYLEPHRINE HCL 10 MG/ML IJ SOLN
INTRAVENOUS | Status: DC | PRN
  Administered 2016-07-13: 40 ug/min via INTRAVENOUS

## 2016-07-13 MED ORDER — PANTOPRAZOLE SODIUM 40 MG IV SOLR
40.0000 mg | Freq: Two times a day (BID) | INTRAVENOUS | Status: DC
  Administered 2016-07-13 – 2016-07-18 (×11): 40 mg via INTRAVENOUS
  Filled 2016-07-13 (×11): qty 40

## 2016-07-13 MED ORDER — ETOMIDATE 2 MG/ML IV SOLN
INTRAVENOUS | Status: DC | PRN
  Administered 2016-07-13: 18 mg via INTRAVENOUS
  Administered 2016-07-13: 5 mg via INTRAVENOUS

## 2016-07-13 MED ORDER — ORAL CARE MOUTH RINSE
15.0000 mL | Freq: Four times a day (QID) | OROMUCOSAL | Status: DC
  Administered 2016-07-14 – 2016-07-20 (×25): 15 mL via OROMUCOSAL

## 2016-07-13 MED ORDER — 0.9 % SODIUM CHLORIDE (POUR BTL) OPTIME
TOPICAL | Status: DC | PRN
  Administered 2016-07-13: 2000 mL

## 2016-07-13 MED ORDER — ORAL CARE MOUTH RINSE
15.0000 mL | Freq: Two times a day (BID) | OROMUCOSAL | Status: DC
  Administered 2016-07-13: 15 mL via OROMUCOSAL

## 2016-07-13 MED ORDER — LACTATED RINGERS IV SOLN
INTRAVENOUS | Status: DC | PRN
  Administered 2016-07-13: 12:00:00 via INTRAVENOUS

## 2016-07-13 MED ORDER — ROCURONIUM BROMIDE 100 MG/10ML IV SOLN
INTRAVENOUS | Status: DC | PRN
  Administered 2016-07-13 (×2): 50 mg via INTRAVENOUS

## 2016-07-13 MED ORDER — LIDOCAINE HCL (CARDIAC) 20 MG/ML IV SOLN
INTRAVENOUS | Status: DC | PRN
  Administered 2016-07-13: 60 mg via INTRAVENOUS

## 2016-07-13 MED ORDER — MAGNESIUM OXIDE 400 (241.3 MG) MG PO TABS
400.0000 mg | ORAL_TABLET | Freq: Two times a day (BID) | ORAL | Status: AC
  Administered 2016-07-14: 400 mg
  Filled 2016-07-13 (×2): qty 1

## 2016-07-13 MED ORDER — PROPOFOL 10 MG/ML IV BOLUS
INTRAVENOUS | Status: AC
  Filled 2016-07-13: qty 20

## 2016-07-13 MED ORDER — SUCCINYLCHOLINE CHLORIDE 20 MG/ML IJ SOLN
INTRAMUSCULAR | Status: DC | PRN
  Administered 2016-07-13: 100 mg via INTRAVENOUS

## 2016-07-13 MED ORDER — FENTANYL CITRATE (PF) 100 MCG/2ML IJ SOLN
50.0000 ug | INTRAMUSCULAR | Status: AC | PRN
  Administered 2016-07-13 – 2016-07-15 (×3): 50 ug via INTRAVENOUS
  Filled 2016-07-13 (×8): qty 2

## 2016-07-13 MED ORDER — LIDOCAINE HCL (PF) 1 % IJ SOLN
INTRAMUSCULAR | Status: DC | PRN
  Administered 2016-07-13: 15 mL

## 2016-07-13 MED ORDER — BUPIVACAINE-EPINEPHRINE 0.25% -1:200000 IJ SOLN
INTRAMUSCULAR | Status: DC | PRN
  Administered 2016-07-13: 15 mL

## 2016-07-13 MED ORDER — INSULIN ASPART 100 UNIT/ML ~~LOC~~ SOLN
0.0000 [IU] | SUBCUTANEOUS | Status: DC
  Administered 2016-07-14 (×4): 2 [IU] via SUBCUTANEOUS
  Administered 2016-07-15 (×2): 3 [IU] via SUBCUTANEOUS
  Administered 2016-07-15: 5 [IU] via SUBCUTANEOUS
  Administered 2016-07-15 – 2016-07-16 (×3): 2 [IU] via SUBCUTANEOUS
  Administered 2016-07-16: 5 [IU] via SUBCUTANEOUS
  Administered 2016-07-16 (×2): 2 [IU] via SUBCUTANEOUS
  Administered 2016-07-17: 4 [IU] via SUBCUTANEOUS
  Administered 2016-07-17: 3 [IU] via SUBCUTANEOUS
  Administered 2016-07-17 (×2): 2 [IU] via SUBCUTANEOUS
  Administered 2016-07-17: 4 [IU] via SUBCUTANEOUS
  Administered 2016-07-17: 2 [IU] via SUBCUTANEOUS
  Administered 2016-07-18: 5 [IU] via SUBCUTANEOUS
  Administered 2016-07-18: 4 [IU] via SUBCUTANEOUS
  Administered 2016-07-18: 2 [IU] via SUBCUTANEOUS
  Administered 2016-07-18: 4 [IU] via SUBCUTANEOUS
  Administered 2016-07-18: 5 [IU] via SUBCUTANEOUS
  Administered 2016-07-18 – 2016-07-19 (×2): 2 [IU] via SUBCUTANEOUS
  Administered 2016-07-19 (×2): 3 [IU] via SUBCUTANEOUS
  Administered 2016-07-19: 4 [IU] via SUBCUTANEOUS
  Administered 2016-07-20: 2 [IU] via SUBCUTANEOUS
  Administered 2016-07-20: 4 [IU] via SUBCUTANEOUS
  Administered 2016-07-20: 2 [IU] via SUBCUTANEOUS

## 2016-07-13 MED ORDER — ATORVASTATIN CALCIUM 40 MG PO TABS
40.0000 mg | ORAL_TABLET | Freq: Every day | ORAL | Status: DC
  Administered 2016-07-13 – 2016-08-02 (×21): 40 mg
  Filled 2016-07-13 (×19): qty 1

## 2016-07-13 SURGICAL SUPPLY — 124 items
APL SKNCLS STERI-STRIP NONHPOA (GAUZE/BANDAGES/DRESSINGS) ×1
APPLIER CLIP 5 13 M/L LIGAMAX5 (MISCELLANEOUS)
APPLIER CLIP ROT 10 11.4 M/L (STAPLE)
APR CLP MED LRG 11.4X10 (STAPLE)
APR CLP MED LRG 5 ANG JAW (MISCELLANEOUS)
BAG LAPAROSCOPIC 12 15 PORT 16 (BASKET) IMPLANT
BAG RETRIEVAL 12/15 (BASKET) ×2
BAG RETRIEVAL 12/15MM (BASKET) ×1
BENZOIN TINCTURE PRP APPL 2/3 (GAUZE/BANDAGES/DRESSINGS) ×2 IMPLANT
BLADE EXTENDED COATED 6.5IN (ELECTRODE) IMPLANT
BLADE HEX COATED 2.75 (ELECTRODE) ×3 IMPLANT
BLADE SURG SZ10 CARB STEEL (BLADE) ×3 IMPLANT
CHLORAPREP W/TINT 26ML (MISCELLANEOUS) ×3 IMPLANT
CLIP APPLIE 5 13 M/L LIGAMAX5 (MISCELLANEOUS) IMPLANT
CLIP APPLIE ROT 10 11.4 M/L (STAPLE) IMPLANT
CLIP LIGATING HEM O LOK PURPLE (MISCELLANEOUS) ×3 IMPLANT
CLIP LIGATING HEMO O LOK GREEN (MISCELLANEOUS) ×3 IMPLANT
CLIP TI LARGE 6 (CLIP) IMPLANT
CLOSURE WOUND 1/2 X4 (GAUZE/BANDAGES/DRESSINGS) ×1
COVER SURGICAL LIGHT HANDLE (MISCELLANEOUS) ×3 IMPLANT
CUTTER FLEX LINEAR 45M (STAPLE) IMPLANT
DEVICE TROCAR PUNCTURE CLOSURE (ENDOMECHANICALS) ×2 IMPLANT
DISSECTOR ROUND CHERRY 3/8 STR (MISCELLANEOUS) IMPLANT
DRAIN CHANNEL RND F F (WOUND CARE) IMPLANT
DRAPE C-ARM 42X120 X-RAY (DRAPES) IMPLANT
DRAPE SHEET LG 3/4 BI-LAMINATE (DRAPES) IMPLANT
DRAPE WARM FLUID 44X44 (DRAPE) ×3 IMPLANT
DRESSING TELFA ISLAND 4X8 (GAUZE/BANDAGES/DRESSINGS) IMPLANT
DRSG PAD ABDOMINAL 8X10 ST (GAUZE/BANDAGES/DRESSINGS) IMPLANT
DRSG TEGADERM 2-3/8X2-3/4 SM (GAUZE/BANDAGES/DRESSINGS) ×2 IMPLANT
DRSG TEGADERM 4X4.75 (GAUZE/BANDAGES/DRESSINGS) ×2 IMPLANT
DRSG TELFA 4X10 ISLAND STR (GAUZE/BANDAGES/DRESSINGS) IMPLANT
DRSG TELFA PLUS 4X6 ADH ISLAND (GAUZE/BANDAGES/DRESSINGS) IMPLANT
ELECT PENCIL ROCKER SW 15FT (MISCELLANEOUS) ×3 IMPLANT
ENDOLOOP SUT PDS II  0 18 (SUTURE)
ENDOLOOP SUT PDS II 0 18 (SUTURE) IMPLANT
EVACUATOR SILICONE 100CC (DRAIN) IMPLANT
GAUZE SPONGE 2X2 8PLY STRL LF (GAUZE/BANDAGES/DRESSINGS) IMPLANT
GAUZE SPONGE 4X4 12PLY STRL (GAUZE/BANDAGES/DRESSINGS) IMPLANT
GAUZE SPONGE 4X4 16PLY XRAY LF (GAUZE/BANDAGES/DRESSINGS) IMPLANT
GLOVE BIO SURGEON STRL SZ 6 (GLOVE) ×6 IMPLANT
GLOVE INDICATOR 6.5 STRL GRN (GLOVE) ×6 IMPLANT
GOWN STRL REUS TWL 2XL XL LVL4 (GOWN DISPOSABLE) ×3 IMPLANT
GOWN STRL REUS W/ TWL XL LVL3 (GOWN DISPOSABLE) ×3 IMPLANT
GOWN STRL REUS W/TWL XL LVL3 (GOWN DISPOSABLE) ×9
HANDLE SUCTION POOLE (INSTRUMENTS) IMPLANT
HEMOSTAT SURGICEL 4X8 (HEMOSTASIS) IMPLANT
KIT BASIN OR (CUSTOM PROCEDURE TRAY) ×3 IMPLANT
LOOP MINI RED (MISCELLANEOUS) IMPLANT
LOOP VESSEL MAXI BLUE (MISCELLANEOUS) ×3 IMPLANT
LUBRICANT JELLY K Y 4OZ (MISCELLANEOUS) IMPLANT
NS IRRIG 1000ML POUR BTL (IV SOLUTION) ×6 IMPLANT
PACK UNIVERSAL I (CUSTOM PROCEDURE TRAY) ×3 IMPLANT
PLUG CATH AND CAP STER (CATHETERS) IMPLANT
POSITIONER SURGICAL ARM (MISCELLANEOUS) ×2 IMPLANT
RELOAD 45 VASCULAR/THIN (ENDOMECHANICALS) IMPLANT
RELOAD STAPLE 45 2.5 WHT GRN (ENDOMECHANICALS) IMPLANT
RELOAD STAPLE 45 3.5 BLU ETS (ENDOMECHANICALS) IMPLANT
RELOAD STAPLE 60 3.6 BLU REG (STAPLE) IMPLANT
RELOAD STAPLE 60 3.8 GOLD REG (STAPLE) IMPLANT
RELOAD STAPLE TA45 3.5 REG BLU (ENDOMECHANICALS) IMPLANT
RELOAD STAPLER BLUE 60MM (STAPLE) IMPLANT
RELOAD STAPLER GOLD 60MM (STAPLE) IMPLANT
RELOAD STAPLER WHITE 60MM (STAPLE) ×2 IMPLANT
SHEARS FOC LG CVD HARMONIC 17C (MISCELLANEOUS) IMPLANT
SHEARS HARMONIC ACE PLUS 36CM (ENDOMECHANICALS) IMPLANT
SHEARS HARMONIC HDI 36CM (ELECTROSURGICAL) ×2 IMPLANT
SLEEVE SURGEON STRL (DRAPES) IMPLANT
SLEEVE XCEL OPT CAN 5 100 (ENDOMECHANICALS) ×6 IMPLANT
SPONGE GAUZE 2X2 STER 10/PKG (GAUZE/BANDAGES/DRESSINGS) ×2
SPONGE LAP 18X18 X RAY DECT (DISPOSABLE) ×3 IMPLANT
SPONGE SURGIFOAM ABS GEL 100 (HEMOSTASIS) IMPLANT
STAPLE ECHEON FLEX 60 POW ENDO (STAPLE) ×2 IMPLANT
STAPLER PROXIMATE 75MM BLUE (STAPLE) IMPLANT
STAPLER RELOAD BLUE 60MM (STAPLE)
STAPLER RELOAD GOLD 60MM (STAPLE)
STAPLER RELOAD WHITE 60MM (STAPLE) ×6
STAPLER VISISTAT 35W (STAPLE) ×3 IMPLANT
STRIP CLOSURE SKIN 1/2X4 (GAUZE/BANDAGES/DRESSINGS) ×1 IMPLANT
SUCTION POOLE HANDLE (INSTRUMENTS)
SUT CHROMIC 3 0 SH 27 (SUTURE) IMPLANT
SUT CHROMIC 4 0 RB 1X27 (SUTURE) IMPLANT
SUT ETHILON 2 0 PS N (SUTURE) IMPLANT
SUT MNCRL AB 4-0 PS2 18 (SUTURE) ×3 IMPLANT
SUT PDS AB 1 TP1 54 (SUTURE) IMPLANT
SUT PROLENE 3 0 SH 48 (SUTURE) IMPLANT
SUT PROLENE 3 0 SH1 36 (SUTURE) IMPLANT
SUT PROLENE 4 0 RB 1 (SUTURE)
SUT PROLENE 4-0 RB1 .5 CRCL 36 (SUTURE) IMPLANT
SUT PROLENE 5 0 CC 1 (SUTURE) IMPLANT
SUT SILK 0 FSL (SUTURE) ×3 IMPLANT
SUT SILK 2 0 (SUTURE) ×3
SUT SILK 2 0 SH CR/8 (SUTURE) ×3 IMPLANT
SUT SILK 2-0 18XBRD TIE 12 (SUTURE) ×1 IMPLANT
SUT SILK 3 0 (SUTURE)
SUT SILK 3 0 SH CR/8 (SUTURE) IMPLANT
SUT SILK 3-0 18XBRD TIE 12 (SUTURE) IMPLANT
SUT VIC AB 3-0 SH 18 (SUTURE) IMPLANT
SUT VIC AB 4-0 SH 18 (SUTURE) IMPLANT
SUT VICRYL 0 ENDOLOOP (SUTURE) IMPLANT
SUT VICRYL 0 UR6 27IN ABS (SUTURE) ×4 IMPLANT
SUT VICRYL 2 0 18  UND BR (SUTURE) ×2
SUT VICRYL 2 0 18 UND BR (SUTURE) ×1 IMPLANT
SUT VICRYL 3 0 BR 18  UND (SUTURE) ×2
SUT VICRYL 3 0 BR 18 UND (SUTURE) ×1 IMPLANT
SYR 10ML LL (SYRINGE) IMPLANT
SYR BULB IRRIGATION 50ML (SYRINGE) IMPLANT
SYS LAPSCP GELPORT 120MM (MISCELLANEOUS)
SYSTEM LAPSCP GELPORT 120MM (MISCELLANEOUS) IMPLANT
TAPE CLOTH 4X10 WHT NS (GAUZE/BANDAGES/DRESSINGS) ×4 IMPLANT
TAPE UMBILICAL COTTON 1/8X30 (MISCELLANEOUS) ×3 IMPLANT
TIP RIGID 35CM EVICEL (HEMOSTASIS) ×2 IMPLANT
TOWEL BLUE STERILE X RAY DET (MISCELLANEOUS) IMPLANT
TOWEL OR 17X26 10 PK STRL BLUE (TOWEL DISPOSABLE) ×3 IMPLANT
TRAY FOLEY W/METER SILVER 16FR (SET/KITS/TRAYS/PACK) ×3 IMPLANT
TRAY LAPAROSCOPIC (CUSTOM PROCEDURE TRAY) ×3 IMPLANT
TROCAR BLADELESS 15MM (ENDOMECHANICALS) ×2 IMPLANT
TROCAR BLADELESS OPT 5 100 (ENDOMECHANICALS) IMPLANT
TROCAR XCEL 12X100 BLDLESS (ENDOMECHANICALS) IMPLANT
TROCAR XCEL BLUNT TIP 100MML (ENDOMECHANICALS) ×3 IMPLANT
TROCAR XCEL NON-BLD 11X100MML (ENDOMECHANICALS) IMPLANT
TUBING INSUF HEATED (TUBING) ×3 IMPLANT
YANKAUER SUCT BULB TIP 10FT TU (MISCELLANEOUS) ×3 IMPLANT
YANKAUER SUCT BULB TIP NO VENT (SUCTIONS) IMPLANT

## 2016-07-13 NOTE — Transfer of Care (Signed)
Immediate Anesthesia Transfer of Care Note  Patient: [name]  Procedure(s) Performed: Procedure(s): LAPAROSCOPIC SPLENECTOMY (N/A)  Patient Location: PACU and ICU  Anesthesia Type:General  Level of Consciousness: unresponsive and Patient remains intubated per anesthesia plan  Airway & Oxygen Therapy: Patient placed on Ventilator (see vital sign flow sheet for setting)  Post-op Assessment: Report given to RN and Post -op Vital signs reviewed and stable  Post vital signs: Reviewed and stable  Last Vitals:  Vitals:   07/13/16 1045 07/13/16 1119  BP: (!) 114/44 107/74  Pulse: 72 74  Resp: (!) 21 (!) 28  Temp:  37.1 C    Last Pain:  Vitals:   07/13/16 1119  TempSrc: Oral  PainSc:          Complications: No apparent anesthesia complications

## 2016-07-13 NOTE — Progress Notes (Signed)
Inpatient Diabetes Program Recommendations  AACE/ADA: New Consensus Statement on Inpatient Glycemic Control (2015)  Target Ranges:  Prepandial:   less than 140 mg/dL      Peak postprandial:   less than 180 mg/dL (1-2 hours)      Critically ill patients:  140 - 180 mg/dL   Lab Results  Component Value Date   GLUCAP 209 (H) 07/13/2016   HGBA1C 6.6 (H) 07/09/2016    Review of Glycemic Control  Diabetes history: DM2 Outpatient Diabetes medications: Lantus 30 units QD, metformin 1000 mg bid Current orders for Inpatient glycemic control: Lantus 22 units QD, Novolog 0-9 units tidwc and hs  FBS and post-prandial blood sugars elevated. Needs insulin adjustment.  Inpatient Diabetes Program Recommendations:    Increase Lantus to 26 units QD. Increase Novolog to 0-15 units Q4H while NPO.  Continue to follow. Thank you. Lorenda Peck, RD, LDN, CDE Inpatient Diabetes Coordinator [phone]

## 2016-07-13 NOTE — Progress Notes (Signed)
PT in procedure at this time (RT came to deliver 1400 neb tx). RT added sterile water to [place] HFNC.

## 2016-07-13 NOTE — Progress Notes (Addendum)
Progress Note  Patient Name: [name] Date of Encounter: 07/13/2016  Primary Cardiologist: Dr Bensimhon/Hilty  Subjective   SOB at rest. Some improvement after thoracentesis yesterday.  Inpatient Medications    Scheduled Meds: . sodium chloride   Intravenous Once  . sodium chloride   Intravenous Once  . sodium chloride   Intravenous Once  . sodium chloride   Intravenous Once  . aminocaproic acid (AMICAR) IVPB  2 g/hr Intravenous Q6H  . amiodarone  150 mg Intravenous Once  . atorvastatin  40 mg Oral q1800  . budesonide (PULMICORT) nebulizer solution  0.25 mg Nebulization BID  . carvedilol  3.125 mg Oral BID WC  . insulin aspart  0-5 Units Subcutaneous QHS  . insulin aspart  0-9 Units Subcutaneous TID WC  . insulin glargine  22 Units Subcutaneous Daily  . ipratropium  0.5 mg Nebulization Q6H  . levalbuterol  1.25 mg Nebulization Q6H  . loratadine  10 mg Oral Daily  . magnesium oxide  400 mg Oral BID  . multivitamin  15 mL Oral Daily  . pantoprazole  40 mg Oral Daily  . potassium chloride  20 mEq Oral Daily  . predniSONE  5 mg Oral Q breakfast   Continuous Infusions: . amiodarone 30 mg/hr (07/13/16 0800)  . diltiazem (CARDIZEM) infusion 5 mg/hr (07/13/16 0800)   PRN Meds: acetaminophen, ALPRAZolam, chlorpheniramine-HYDROcodone, levalbuterol, ondansetron (ZOFRAN) IV, oxymetazoline, zolpidem   Vital Signs    Vitals:   07/13/16 0600 07/13/16 0700 07/13/16 0800 07/13/16 0820  BP: (!) 126/42 (!) 128/59 (!) 124/43   Pulse: 75 81 81   Resp: (!) 25 (!) 23 19   Temp:    97.3 F (36.3 C)  TempSrc:    Oral  SpO2: 95% (!) 89% 98%   Weight:      Height:        Intake/Output Summary (Last 24 hours) at 07/13/16 0848 Last data filed at 07/13/16 0800  Gross per 24 hour  Intake          1686.11 ml  Output             1150 ml  Net           536.11 ml   Filed Weights   07/11/16 0417 07/12/16 0418 07/13/16 0500  Weight: 196 lb 10.4 oz (89.2 kg) 204 lb 9.4 oz (92.8  kg) 197 lb 15.6 oz (89.8 kg)    Telemetry     NSR - Personally Reviewed  ECG     03/11 AF with RVR vs PSVT- Personally Reviewed  Physical Exam   GEN: mild respiratory distress.  Pale, no active nose bleed Neck: No JVD seen but difficult to assess 2nd lines Cardiac: RRR no murmur Respiratory: decreased breath sounds, no wheezing, dullness to percussion 3/4 up bilaterally. Coarse rhonchi on Lt GI: Soft, nontender, non-distended  MS: 1+ LE edema; No deformity. Neuro:  Nonfocal  Psych: Normal affect   Labs    Chemistry  Recent Labs Lab 07/08/16 2058  07/11/16 0353 07/12/16 1416 07/13/16 0341  NA 134*  < > 136 137 138  K 3.7  < > 3.5 3.5 4.0  CL 85*  < > 87* 85* 86*  CO2 37*  < > 43* 44* 43*  GLUCOSE 206*  < > 165* 189* 225*  BUN 32*  < > 19 18 15   CREATININE 1.23  < > 0.79 0.78 0.75  CALCIUM 8.3*  < > 8.2* 8.7* 8.7*  PROT 7.0  --   --  7.0 6.7  ALBUMIN 2.7*  --   --  2.8* 2.6*  AST 24  --   --  19 18  ALT 13*  --   --  14* 15*  ALKPHOS 71  --   --  75 78  BILITOT 0.4  --   --  0.5 0.8  GFRNONAA 56*  < > >60 >60 >60  GFRAA >60  < > >60 >60 >60  ANIONGAP 12  < > 6 8 9   < > = values in this interval not displayed.   Hematology  Recent Labs Lab 07/12/16 1638 07/12/16 2320 07/13/16 0341  WBC 9.6 8.8 9.5  RBC 2.96* 3.49* 3.78*  HGB 7.9* 9.3* 10.2*  HCT 25.7* 30.4* 33.4*  MCV 86.8 87.1 88.4  MCH 26.7 26.6 27.0  MCHC 30.7 30.6 30.5  RDW 15.2 14.9 15.1  PLT 45* 43* 49*    Cardiac Enzymes  Recent Labs Lab 07/08/16 2058 07/09/16 0535 07/09/16 1722  TROPONINI <0.03 <0.03 <0.03   No results for input(s): TROPIPOC in the last 168 hours.   BNP  Recent Labs Lab 07/08/16 2308  BNP 113.8*     Radiology     PORTABLE CHEST 1 VIEW-07/12/16 COMPARISON:  07/11/2016  FINDINGS: Cardiac shadow is stable. Bilateral pleural effusions are again seen. No pneumothorax is noted. Slight reduction in pleural fluid is noted on the left. No bony abnormality is  noted.  IMPRESSION: Bilateral pleural effusions with slight reduction on the left. No pneumothorax is noted.   Cardiac Studies   Echo 07/09/16- Study Conclusions - Left ventricle: The cavity size was normal. Wall thickness was   increased in a pattern of mild LVH. Systolic function was normal.   The estimated ejection fraction was in the range of 60% to 65%.   Although no diagnostic regional wall motion abnormality was   identified, this possibility cannot be completely excluded on the   basis of this study. Features are consistent with a pseudonormal   left ventricular filling pattern, with concomitant abnormal   relaxation and increased filling pressure (grade 2 diastolic   dysfunction). - Mitral valve: Calcified annulus. Mildly calcified leaflets .   There was trivial regurgitation. - Right ventricle: The cavity size was mildly dilated. - Right atrium: Central venous pressure (est): 3 mm Hg. - Atrial septum: No defect or patent foramen ovale was identified. - Tricuspid valve: There was trivial regurgitation. - Pericardium, extracardiac: There was a right pleural effusion.  Impressions:  - Overall limited images. Mild LVH with LVEF 60-65%. Probable grade   2 diastolic dysfunction. Calcified mitral annulus with trivial   mitral regurgitation. Aortic valve is moderately calcified and   not well seen. Right ventricle appears mildly dilated. Trivial   tricuspid regurgitation. 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 Since admission his rate has been difficult to control but has converted to NSR with IV Amiodarone. Not a candidate for anticoagulation with undetectable PTLs and active bleeding requiring transfusion of blood and platelets. He has recurrent bilateral pleural effusions and went for thoracentesis 3/14 in  preparation for splenectomy 3/15. The thoracentesis was only partially successful as the pt developed hypotension and the procedure had to be aborted.   Assessment & Plan    1. AF with RVR vs PSVT- Holding NSR on IV Amiodarone and Diltiazem.Marland Kitchen  2. Thrombocytopenia with active bleeding- Pt being followed by Hematology- streroids increased - for splenectomy 07/13/16. Plts being transfused  3. Blood loss anemia- Hgb dropped from 11.2 to 7. Transfused 07/10/16  4. Pleural effusions- Recurrent pleural effusions this admission - thoracentesis 07/12/16- partially successful-1.2L removed from Lt but then pt became hypotensive.   5. Diastolic dysfunction- EF 65-03% with LVH and grade 2 DD  Plan- He is at high risk for decompensation if he goes back into AF with RVR. Continue IV Diltiazem and Amiodarone for now- transition to po when able s/p splenectomy. He is for Plts this am then Splenectomy.     Angelena Form, PA-C  07/13/2016, 8:48 AM    Patient examined chart reviewed. For splenectomy today. May very well go back into afib would re bolus with amiodarone Hopefully will not have bleeding issues. PLT;s being transfused. Exam still with atelectasis right greater than left base Due to effusions.   Jenkins Rouge

## 2016-07-13 NOTE — Interval H&P Note (Signed)
History and Physical Interval Note:  07/13/2016 10:56 AM  [name]  has presented today for surgery, with the diagnosis of diopathic thrombocytopenic purpura  The various methods of treatment have been discussed with the patient and family. After consideration of risks, benefits and other options for treatment, the patient has consented to  Procedure(s): LAPAROSCOPIC SPLENECTOMY (N/A) as a surgical intervention .  The patient's history has been reviewed, patient examined, no change in status, stable for surgery.  I have reviewed the patient's chart and labs.  Questions were answered to the patient's satisfaction.    I reiterated with the family that he is quite sick and this is a risky surgery given the low level of platelets that he has had.  We will proceed.  Platelet count is up to 48K today after transfusion of multiple units yesterday and amicar infusion.  Kalina Morabito

## 2016-07-13 NOTE — Consult Note (Signed)
PULMONARY / CRITICAL CARE MEDICINE   Name: [name] MRN: [id number] DOB: November 24, 1940    ADMISSION DATE:  07/08/2016 CONSULTATION DATE: 3/15  REFERRING MD:  Barry Dienes   CHIEF COMPLAINT:  Vent management   HISTORY OF PRESENT ILLNESS:   75 year old male w/ HTN, DM, COPD, BTX and diastolic dysfxn, severe thrombocytopenia and recurrent pleural effusions w/ exudative by analysis back in Feb. He was being followed by Heme for ITP (did not respond to IVIG, or rituxan). Was admitted once again on 3/10 w/ HR 150s, shortness of breath and increased LE edema. Also felt to be in refractory ITP. Ultimately consulted by cards for SVT and surgery as well as heme for splenectomy. Treated supportively including a left thoracentesis on 2/14 which was reported as bloody but no fluid analysis sent. He returns to the ICU s/p splenectomy on 3/15 on the vent. PCCM asked to assist w/ care.   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  PAST SURGICAL HISTORY: He  has a past surgical history that includes Colonoscopy; Esophagogastroduodenoscopy; Esophagogastroduodenoscopy (05/19/2011); Hot hemostasis (05/19/2011); and Spinal cyst resection.  No Known Allergies  No current facility-administered medications on file prior to encounter.    Current Outpatient Prescriptions on File Prior to Encounter  Medication Sig  . albuterol (PROAIR HFA) 108 (90 Base) MCG/ACT inhaler 2 puffs every 4 hours as needed only  if your can't catch your breath  . aminocaproic acid (AMICAR) 500 MG tablet Take 4 tablets (2,000 mg total) by mouth every 8 (eight) hours. (Patient taking differently: Take 1,000 mg by mouth  every 8 (eight) hours. )  . bumetanide (BUMEX) 1 MG tablet Take 1 tablet (1 mg total) by mouth 2 (two) times daily.  . chlorpheniramine-HYDROcodone (TUSSIONEX) 10-8 MG/5ML SUER Take 5 mLs by mouth at bedtime as needed for cough.  . cyclophosphamide (CYTOXAN) 50 MG tablet Take 3 tablets (150 mg total) by mouth daily. Give on an empty stomach 1 hour before or 2 hours after meals.  Marland Kitchen ECHINACEA EXTRACT PO Take 1 capsule by mouth daily.  . famotidine (PEPCID) 20 MG tablet One at bedtime (Patient taking differently: Take 20 mg by mouth at bedtime. )  . GINKGO BILOBA PO Take 1 tablet by mouth daily.   . Glucosamine-Chondroit-Vit C-Mn (GLUCOSAMINE 1500 COMPLEX PO) Take 1,500 mg by mouth 2 (two) times daily.  . Insulin Glargine (TOUJEO SOLOSTAR) 300 UNIT/ML SOPN Inject 30 Units into the skin daily.  . metFORMIN (GLUCOPHAGE) 1000 MG tablet Take 1,000 mg by mouth 2 (two) times daily with a meal.  . Multiple Vitamins-Minerals (MULTIVITAMIN PO) Take 1 tablet by mouth daily.   . pantoprazole (PROTONIX) 40 MG tablet Take 1 tablet (40 mg total) by mouth daily. Take 30-60 min before first meal of the day  . predniSONE (DELTASONE) 10 MG tablet Take 1 tablet (10 mg total) by mouth daily with breakfast.  . Respiratory Therapy Supplies (FLUTTER) DEVI Use as directed  . simvastatin (ZOCOR) 80 MG tablet Take 80 mg by mouth daily.   Marland Kitchen losartan (COZAAR) 50 MG tablet Take 1 tablet (50 mg total) by mouth daily. (Patient not taking: Reported on 07/08/2016)    FAMILY HISTORY:  His indicated that the status of his mother is  unknown. He indicated that the status of his father is unknown.    SOCIAL HISTORY: He  reports that he quit smoking about 15 years ago. His smoking use included Cigarettes. He started smoking about 57 years ago. He has a 42.00 pack-year smoking history. He has never used smokeless tobacco. He reports that he drinks alcohol. He reports that he does not use drugs.  REVIEW OF SYSTEMS:   Unable    SUBJECTIVE:  Sedated on vent   VITAL SIGNS: BP (!) 90/43 (BP Location: Left Arm)   Pulse 79   Temp 98.2 F (36.8 C) (Axillary)   Resp 15   Ht 5\' 11"  (1.803 m)   Wt 197 lb 15.6 oz (89.8 kg)   SpO2 100%   BMI 27.61 kg/m   HEMODYNAMICS:    VENTILATOR SETTINGS: Vent Mode: PRVC FiO2 (%):  [100 %] 100 % Set Rate:  [14 bmp] 14 bmp Vt Set:  [600 mL] 600 mL PEEP:  [5 cmH20] 5 cmH20 Plateau Pressure:  [23 cmH20] 23 cmH20  INTAKE / OUTPUT: I/O last 3 completed shifts: In: 1594.4 [P.O.:120; I.V.:829.4; Blood:645] Out: 1685 [Urine:1685]  PHYSICAL EXAMINATION: General:  76 year old male, sedated on vent  Neuro: opens eyes, moves all extremities  HEENT:  Orally intubated no JVD  Cardiovascular:  Regular irreg  Lungs:  Diffuse rhonchi gas exchange tol on vent  Abdomen:  Soft, surgical site unremarkable hypoactive  Musculoskeletal:  Equal st and bulk  Skin:  Pale but warm and dry   LABS:  BMET  Recent Labs Lab 07/11/16 0353 07/12/16 1416 07/13/16 0341 07/13/16 1227  NA 136 137 138 135  K 3.5 3.5 4.0 4.1  CL 87* 85* 86*  --   CO2 43* 44* 43*  --   BUN 19 18 15   --   CREATININE 0.79 0.78 0.75  --   GLUCOSE 165* 189* 225*  --     Electrolytes  Recent Labs Lab 07/11/16 0353 07/12/16 1416 07/13/16 0341  CALCIUM 8.2* 8.7* 8.7*  MG 1.5* 1.4* 1.5*  PHOS  --  3.0 3.6    CBC  Recent Labs Lab 07/12/16 1638 07/12/16 2320 07/13/16 0341 07/13/16 1227  WBC 9.6 8.8 9.5  --   HGB 7.9* 9.3* 10.2* 9.5*  HCT 25.7* 30.4* 33.4* 28.0*  PLT 45* 43* 49*  --     Coag's  Recent Labs Lab 07/09/16 0536  INR 1.16    Sepsis Markers No results for input(s): LATICACIDVEN, PROCALCITON, O2SATVEN in the last 168 hours.  ABG  Recent Labs Lab 07/13/16 1227 07/13/16 1552  PHART 7.451* 7.370  PCO2ART 66.6* 65.1*  PO2ART 121.0* 172*    Liver Enzymes  Recent Labs Lab 07/08/16 2058 07/12/16 1416 07/13/16 0341  AST 24 19 18   ALT 13* 14* 15*  ALKPHOS 71 75  78  BILITOT 0.4 0.5 0.8  ALBUMIN 2.7* 2.8* 2.6*    Cardiac Enzymes  Recent Labs Lab 07/08/16 2058 07/09/16 0535 07/09/16 1722  TROPONINI <0.03 <0.03 <0.03    Glucose  Recent Labs Lab 07/12/16 0754 07/12/16 1345 07/12/16 1639 07/12/16 2127 07/13/16 0821 07/13/16 1547  GLUCAP 227* 186* 194* 160* 209* 158*    Imaging No results found.   STUDIES:    CULTURES:   ANTIBIOTICS:   SIGNIFICANT EVENTS: Left thora 1.2 blood tinged fluid   LINES/TUBES: OETT 3/15>>>    ASSESSMENT / PLAN:  Refractory ITP now s/p splenectomy  Persistent thrombocytosis  Acute blood loss anemia  Plan Repeat CBC  now and in am Getting PLTs now Further recs per heme Post-op rx per surgery   Acute hypoxic respiratory failure in setting bilateral pleural effusions  Ventilator dependence  Recurrent Bilateral pleural effusions during admit in Feb was exudative. No fluid sent on 3/14 h/o COPD & BTX Plan Full vent support Scheduled BDs Repeat PCXR, ABG now Will assess for weaning in am but best to let things settle for now May need Thoracentesis prior to extubation -->if tapped will send for evaluation   PAF Plan  cont IV amio and cardiazem   Diastolic HF  Plan Tele KVO IVFs Further recs per cards  Hypomagnesemia  Plan Replace/ recheck   Hyperglycemia  Plan  SSI  h/o GERD Plan PPI   Post-op pain Plan PRN analgesia    Discussion Post-op splenectomy for refractory ITP. Sedated on vent. Hemodynamics stable. Getting Platelets. Will watch over night, get CXR, assess for weaning in am.   DVT prophylaxis: SCD SUP: PPI Diet: NPO Activity: bedrest Disposition : ICU  My ccm time 40 minutes   Erick Colace ACNP-BC [place] Pager # [phone] OR # [phone] if no answer   07/13/2016, 4:15 PM

## 2016-07-13 NOTE — Anesthesia Preprocedure Evaluation (Addendum)
Anesthesia Evaluation  Patient identified by MRN, date of birth, ID band Patient awake    Reviewed: Allergy & Precautions, H&P , NPO status , Patient's Chart, lab work & pertinent test results  History of Anesthesia Complications Negative for: history of anesthetic complications  Airway Mallampati: II   Neck ROM: full    Dental   Pulmonary shortness of breath, COPD, former smoker,    + rhonchi  + decreased breath sounds      Cardiovascular hypertension, +CHF  + dysrhythmias Atrial Fibrillation  Rhythm:regular Rate:Normal  TTE 06/11/16: LV poorly visualized. Grade 1 diastolic dysfunction with EF 55-60% and normal regional wall motion. LA poorly visualized but appears normal in size. RA normal in size. RV poorly visualized but cavity size appeared normal as well as systolic function. Aortic valve poorly visualized but without obvious stenosis or regurgitation. Aortic root normal in size. Mitral valve poorly visualized but without evidence of stenosis or regurgitation. Pulmonic valve poorly visualized but without stenosis or regurgitation. Mild tricuspid regurgitation. No pericardial effusion   Neuro/Psych negative neurological ROS  negative psych ROS   GI/Hepatic Neg liver ROS, GERD  ,  Endo/Other  diabetes, Type 2  Renal/GU negative Renal ROS     Musculoskeletal   Abdominal   Peds  Hematology  (+) Blood dyscrasia, , ITP   Anesthesia Other Findings   Reproductive/Obstetrics                            Anesthesia Physical Anesthesia Plan  ASA: III  Anesthesia Plan: General   Post-op Pain Management:    Induction: Intravenous  Airway Management Planned: Oral ETT  Additional Equipment: Arterial line  Intra-op Plan:   Post-operative Plan: Possible Post-op intubation/ventilation  Informed Consent: I have reviewed the patients History and Physical, chart, labs and discussed the procedure  including the risks, benefits and alternatives for the proposed anesthesia with the patient or authorized representative who has indicated his/her understanding and acceptance.     Plan Discussed with: CRNA, Anesthesiologist and Surgeon  Anesthesia Plan Comments:        Anesthesia Quick Evaluation

## 2016-07-13 NOTE — Op Note (Signed)
PRE-OPERATIVE DIAGNOSIS: ITP  POST-OPERATIVE DIAGNOSIS:  Same  PROCEDURE:  Procedure(s): Laparoscopic splenectomy  SURGEON:  Surgeon(s): Stark Klein, MD  ASSISTANT: Verita Lamb, MD  ANESTHESIA:   local and general  DRAINS: none   LOCAL MEDICATIONS USED:  BUPIVICAINE  and LIDOCAINE   SPECIMEN:  Source of Specimen:  spleen  DISPOSITION OF SPECIMEN:  PATHOLOGY  COUNTS:  YES  DICTATION: .Dragon Dictation  PLAN OF CARE: Back to ICU intubated.     PATIENT DISPOSITION:  ICU intubated  FINDINGS:  Small    EBL: 50 mL  PROCEDURE:   Patient was identified in the holding area and taken to the operating room where he was placed supine on operating room table. General anesthesia was induced. The patient had some phimosis and required gentle retraction of his foreskin.  Foley catheter was placed. The foreskin was pulled back over the the glans of the penis.  The patient was then placed into leaning spleen position on a beanbag with appropriate padding.  The patient's abdomen was prepped and draped in sterile fashion.  A timeout was performed according to the surgical safety checklist. When all was correct, we continued.  Local anesthetic was administered at the left costal margin.  A 6 mm incision was made with a #11 blade.  The 0 degree 5 mm scope with an optiview port was used to access the abdomen under direct visualization. Pneumoperitoneum was achieved to a pressure of 15 mmHg.  A 5 mm trocar was placed in the left abdomen and two 5 mm trocars were placed in the left upper quadrant after administration of local anesthetic. The lienocolic ligament was taken down with the harmonic.  Very gentle dissection with the harmonic was performed to divide tissues above and below the hilum.  Hilum was identified and gently some of the posterior attachments were taken down. Short gastrics were divided.  These were diminuative.  The left lateral port was upsized to a 15 mm port.  The articulating  echelon stapler was used to divide the hilar vessels. The posterior attachments of the spleen were then taken down.  No significant oozing was seen.  The 15 mm bag was then used to retrieve the spleen.    Towels were placed around the port site and the bag opened.  The ring clamp was used to morcellate and pull out the splenic tissue, taking care not to damage the bag.  Once the spleen and bag were out, the towels were removed, the suction was passed off, and gloves were changed.  A clean lap was placed into the port site.    The abdomen was reinsufflated.  The LUQ was inspected again for bleeding.  No bleeding was seen.  The pancreas was intact.  Tisseal was placed on the raw surfaces where the spleen was removed.    The endoclose was used to close the 15 mm port with two interrupted 0-0 vicryl sutures.  There was no residual palpable fascial defect.  The skin of the remaining trocar sites was closed with 4-0 Monocryl as well.  Additional local was placed into the wounds.    They were cleaned, dried, and dressed with benzoin, steristrips, gauze, and tegaderm.    Pt was taken directly to the ICU intubated.  Needle, sponge, and instrument counts were correct x 2.

## 2016-07-13 NOTE — Progress Notes (Signed)
PT brought to 1239 from OR on vent. RT placed PT on vent- settings on flow sheet.

## 2016-07-13 NOTE — Anesthesia Postprocedure Evaluation (Signed)
Anesthesia Post Note  Patient: [name]  Procedure(s) Performed: Procedure(s) (LRB): LAPAROSCOPIC SPLENECTOMY (N/A)  Patient location during evaluation: SICU Anesthesia Type: General Level of consciousness: sedated Pain management: pain level controlled Vital Signs Assessment: post-procedure vital signs reviewed and stable Respiratory status: patient remains intubated per anesthesia plan and patient on ventilator - see flowsheet for VS Cardiovascular status: stable Anesthetic complications: no       Last Vitals:  Vitals:   07/13/16 1545 07/13/16 1600  BP: (!) 97/46 (!) 90/43  Pulse: 79   Resp: 12 15  Temp: 36.8 C     Last Pain:  Vitals:   07/13/16 1545  TempSrc: Axillary  PainSc:                  [name] S

## 2016-07-13 NOTE — Anesthesia Procedure Notes (Signed)
Procedure Name: Intubation Date/Time: 07/13/2016 11:55 AM Performed by: Marcie Bal, ADAM Pre-anesthesia Checklist: Patient identified, Emergency Drugs available, Suction available, Patient being monitored and Timeout performed Patient Re-evaluated:Patient Re-evaluated prior to inductionOxygen Delivery Method: Circle system utilized Preoxygenation: Pre-oxygenation with 100% oxygen Intubation Type: IV induction, Cricoid Pressure applied and Rapid sequence Laryngoscope Size: Mac and 4 Grade View: Grade I Tube type: Subglottic suction tube Tube size: 8.0 mm Number of attempts: 1 Airway Equipment and Method: Stylet Placement Confirmation: ETT inserted through vocal cords under direct vision,  positive ETCO2 and breath sounds checked- equal and bilateral Secured at: 23 cm Tube secured with: Tape Dental Injury: Teeth and Oropharynx as per pre-operative assessment

## 2016-07-13 NOTE — Progress Notes (Signed)
PROGRESS NOTE    [name]  CZY:[id number] DOB: 1940-05-03 DOA: 07/08/2016 PCP: [name], MD  Brief Narrative:  [name] is a 76 y.o. male with medical history significant of hypertension, hyperlipidemia, diabetes mellitus, COPD, GERD, idiopathic thrombocytopenia, bilateral pleural effusion, bronchiectasis, PSVT, dCHF, who presents with shortness of breath and bilateral leg edema.  He is on treatment by Dr Nadara Eaton for his ITP and was recently admitted 2/10-2/20 with a large pleural effusion needing thoracentesis, SVT and diuresed with improvement in symptoms. Evaluted by cardiology, Pulmonary and Oncology during that visit.   Returns for HR >150 noted while he was checking Vitals at home. ER performed electrical cardioverstion due to concomitant complaint of dyspnea, then placed him on a Cardizem infusion and admitted to SDU. Has severe refractory ITP so Oncology consulted and recommended Splenectomy so General Surgery consulted patient to undergo splenectomy today. Patient is s/p Left Thoracentesis 3/14. States he is still SOB but feels better. Ready for surgery.   Assessment & Plan:   Principal Problem:   Paroxysmal SVT (supraventricular tachycardia) (HCC) Active Problems:   COPD  GOLD III    Obstructive bronchiectasis (HCC)   Pleural effusion   Chronic diastolic CHF (congestive heart failure) (HCC)   Diabetes mellitus without complication (HCC)   HLD (hyperlipidemia)   GERD (gastroesophageal reflux disease)   Acute on chronic diastolic CHF (congestive heart failure) (HCC)   Acute on chronic respiratory failure with hypoxia (HCC)   SVT (supraventricular tachycardia) (HCC)   Atrial fibrillation with RVR (HCC)   Chronic ITP (idiopathic thrombocytopenia) (HCC)  SVT  At home and in ER Paroxysmal A-fib with RVR subsequently - occurred during Feb admission as well - s/p electrical cardioversion in ER following by Cardizem infusion - oral Amio started by cardiology  on 3/11 - was on Amio at one point during last admission but this was stopped prior to discharge- he does have significant COPD per pulm notes - replacing electrolytes aggressively - 3/12- cardiology changed Oral Amio to IV 3/13- HR In 150s- per RN it was in 26s when Cardizem infusion was a 12.5- it was then decreased to 10 and HR when up- now increased to 12.5 again-   -Cardiology Following and recommending continuing Amiodarone and Diltiazem gtts and recommend transition to po when able s/p splenectomy   Abnormal thyroid functions - TSH elevated at 5.003- F T4 elevated at 1.39 - doubt this mild elevation is causing SVT - TSH was normal on 05/05/16 and 06/11/16 - now on Amiodarone gtt as well which can result in further hyperthyroidism - will recommend these be rechecked in 3-4 wks  B/l pleural effusions (s/p Left Thoracentesis draining 1.2 Liters of bloody fluid), dCHF- chronic, 2+ pedal edema - effusions were present on last admission as well and are not new - L thoracentesis done on 2/14 with 1.5 L dark bloody fluid removed; Repeat Left Thoracentesis done 3/14 showed 1.2 Liters of Bloody Fluid - 3/11 > ECHO shows grade 2 dCHF and normal EF - on Bumex at home - has not been taking his Losartan due to low BP - was due to see CHF clinic on 3/12 but ended up in the hospital - Cardiology consulted and Discontinued Lasix per Cardiology note - S/p Left Thoracentesis done by IR  3/14 after platelet infusions   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 hypercarbia  - has been using Proair, 10 mg Prednisone daily, Tussionex at bedtime and 3 L O2 continuously - will use Xopenex TID in  hospital PRN only due to SVT - added Pulmicort to control congestion- cont Atovent nebs - follow for worsening fibrosis with Amiodarone  ITP  - reviewed Dr Geralyn Flash notes - s/p IVIG infusions, rituximab, cyclophosphamide -  3/12 per Dr [name]  stopped Cyclophosphamide and Prednisone and surgery consult  for splenectomy; Splenectomy to be done today -C/w Prednisone 5mg  today for a few days -s/p Transfusions of 4 pools of Platelets -Repeat Platelet Count was 49 this AM -Will undergo transfusion of 6 units of platelets per Oncology Recc's for surgery today - Dr Barry Dienes to evaluate for splenectomy and to undergo splenectomy today  Anemia, s/p Transfusion of 2 unit of pRBC - acute-- cause unknown - baseline is 8- Hb of 11 on admission was likely inaccurate  -s/p Transfusion of 2 units and Hb/Hct went to 10.2/33.4 -Will need to Repeat CBC in AM  Nose Bleeds - C/w Amicar 2 g/hr IV q6h x 48 hours - C/w Oxymetazoline 2 spray each nare BID daily    Diabetes mellitus without complication - D4Y 6.6 - C/w Sensitive Novolog SSI AC and HS - C/w Lantus 22 units sq Daily  - CBG's ranging from 146-233  DVT prophylaxis: SCDs Code Status: FULL CODE Family Communication: No Family present at bedside Disposition Plan: Likely SNF; Patient going for Surgery today  Consultants:   Cardiology  Oncology  General Surgery  Interventional Radiology   Procedures: Left Thoracentesis yeilding bloody 1.2 Liters   Antimicrobials:  Anti-infectives    None     Subjective: Seen and examined and stated he felt slightly better but did not rest at all. Still remained extremely SOB. Ready for his surgery. No other complaints or concerns at this time.    Objective: Vitals:   07/13/16 0950 07/13/16 1000 07/13/16 1015 07/13/16 1119  BP: 107/62 (!) 114/55 (!) 97/40 107/74  Pulse: 83 78 77 74  Resp: (!) 32 (!) 26 (!) 22 (!) 28  Temp: 97.7 F (36.5 C)  97.6 F (36.4 C) 98.7 F (37.1 C)  TempSrc: Oral  Oral Oral  SpO2: 97% 99% 100%   Weight:      Height:        Intake/Output Summary (Last 24 hours) at 07/13/16 1338 Last data filed at 07/13/16 1300  Gross per 24 hour  Intake          2513.61 ml  Output              925 ml  Net          1588.61 ml   Filed Weights   07/11/16 0417 07/12/16  0418 07/13/16 0500  Weight: 89.2 kg (196 lb 10.4 oz) 92.8 kg (204 lb 9.4 oz) 89.8 kg (197 lb 15.6 oz)   Examination: Physical Exam:  Constitutional: Appears calm and in no acute distress Eyes: Lids and conjunctivae normal, sclerae anicteric  ENMT: External Ears, Nares appear to have dry blood.  Neck: Appears normal, supple, no cervical masses, normal ROM, no appreciable thyromegaly Respiratory: Diminished bilaterally, no wheezing, rales but had some rhonchi. Slightly increased respiratory effort. No accessory muscle use.  Cardiovascular: Irregularly Irregular, no murmurs / rubs / gallops. S1 and S2 auscultated. 2+ Lower Extremity Edema Abdomen: Soft, non-tender, non-distended. No masses palpated. No appreciable hepatosplenomegaly. Bowel sounds positive x4.  GU: Deferred. Musculoskeletal: No clubbing / cyanosis of digits/nails. No joint deformity upper and lower extremities. Skin: No rashes, lesions, ulcers on limited skin evaluation. No induration; Warm and dry.  Neurologic: CN 2-12 grossly intact  with no focal deficits. Romberg sign cerebellar reflexes not assessed.  Psychiatric: Normal judgment and insight. Alert and oriented x 3. Normal mood and appropriate affect.   Data Reviewed: I have personally reviewed following labs and imaging studies  CBC:  Recent Labs Lab 07/08/16 2058  07/10/16 0753 07/10/16 1630 07/11/16 0353 07/12/16 1638 07/12/16 2320 07/13/16 0341  WBC 9.1  < > 9.8  --  9.8 9.6 8.8 9.5  NEUTROABS 6.1  --   --   --   --  8.0* 6.7 8.0*  HGB 11.2*  < > 7.1* 8.3* 8.3* 7.9* 9.3* 10.2*  HCT 34.6*  < > 23.5* 27.9* 26.9* 25.7* 30.4* 33.4*  MCV 88.3  < > 90.7  --  87.6 86.8 87.1 88.4  PLT <5*  < > <5*  --  <5* 45* 43* 49*  < > = values in this interval not displayed. Basic Metabolic Panel:  Recent Labs Lab 07/08/16 2058 07/09/16 0528 07/10/16 0357 07/11/16 0353 07/12/16 1416 07/13/16 0341  NA 134*  --  135 136 137 138  K 3.7  --  4.2 3.5 3.5 4.0  CL 85*  --   88* 87* 85* 86*  CO2 37*  --  40* 43* 44* 43*  GLUCOSE 206*  --  192* 165* 189* 225*  BUN 32*  --  25* 19 18 15   CREATININE 1.23  --  0.85 0.79 0.78 0.75  CALCIUM 8.3*  --  8.1* 8.2* 8.7* 8.7*  MG  --  1.0* 1.7 1.5* 1.4* 1.5*  PHOS  --   --   --   --  3.0 3.6   GFR: Estimated Creatinine Clearance: 86.3 mL/min (by C-G formula based on SCr of 0.75 mg/dL). Liver Function Tests:  Recent Labs Lab 07/08/16 2058 07/12/16 1416 07/13/16 0341  AST 24 19 18   ALT 13* 14* 15*  ALKPHOS 71 75 78  BILITOT 0.4 0.5 0.8  PROT 7.0 7.0 6.7  ALBUMIN 2.7* 2.8* 2.6*   No results for input(s): LIPASE, AMYLASE in the last 168 hours. No results for input(s): AMMONIA in the last 168 hours. Coagulation Profile:  Recent Labs Lab 07/09/16 0536  INR 1.16   Cardiac Enzymes:  Recent Labs Lab 07/08/16 2058 07/09/16 0535 07/09/16 1722  TROPONINI <0.03 <0.03 <0.03   BNP (last 3 results)  Recent Labs  02/17/16 0948 05/05/16 1450  PROBNP 40.0 17.0   HbA1C: No results for input(s): HGBA1C in the last 72 hours. CBG:  Recent Labs Lab 07/12/16 0754 07/12/16 1345 07/12/16 1639 07/12/16 2127 07/13/16 0821  GLUCAP 227* 186* 194* 160* 209*   Lipid Profile: No results for input(s): CHOL, HDL, LDLCALC, TRIG, CHOLHDL, LDLDIRECT in the last 72 hours. Thyroid Function Tests: No results for input(s): TSH, T4TOTAL, FREET4, T3FREE, THYROIDAB in the last 72 hours. Anemia Panel: No results for input(s): VITAMINB12, FOLATE, FERRITIN, TIBC, IRON, RETICCTPCT in the last 72 hours. Sepsis Labs: No results for input(s): PROCALCITON, LATICACIDVEN in the last 168 hours.  Recent Results (from the past 240 hour(s))  MRSA PCR Screening     Status: None   Collection Time: 07/09/16  4:17 PM  Result Value Ref Range Status   MRSA by PCR NEGATIVE NEGATIVE Final    Comment:        The GeneXpert MRSA Assay (FDA approved for NASAL specimens only), is one component of a comprehensive MRSA  colonization surveillance program. It is not intended to diagnose MRSA infection nor to guide or monitor treatment for MRSA  infections.     Radiology Studies: Dg Chest Port 1 View  Result Date: 07/12/2016 CLINICAL DATA:  Status post left thoracentesis EXAM: PORTABLE CHEST 1 VIEW COMPARISON:  07/11/2016 FINDINGS: Cardiac shadow is stable. Bilateral pleural effusions are again seen. No pneumothorax is noted. Slight reduction in pleural fluid is noted on the left. No bony abnormality is noted. IMPRESSION: Bilateral pleural effusions with slight reduction on the left. No pneumothorax is noted. Electronically Signed   By: Inez Catalina M.D.   On: 07/12/2016 11:17   US Thoracentesis Asp Pleural Space W/img Guide  Result Date: 07/12/2016 INDICATION: ITP with thrombocytopenia and a platelet count of less than 5,000. Bilateral pleural effusions noted on chest x-ray. Patient to undergo splenectomy tomorrow and having some dyspnea. Request was made for bilateral thoracenteses. EXAM: ULTRASOUND GUIDED THERAPEUTIC LEFT THORACENTESIS MEDICATIONS: 1% lidocaine. COMPLICATIONS: None immediate. PROCEDURE: An ultrasound guided thoracentesis was thoroughly discussed with the patient and questions answered. The benefits, risks, alternatives and complications were also discussed. The patient understands and wishes to proceed with the procedure. Written consent was obtained. Ultrasound was performed to localize and mark an adequate pocket of fluid in the left chest. The area was then prepped and draped in the normal sterile fashion. 1% Lidocaine was used for local anesthesia. Under ultrasound guidance a Safe-T-Centesis catheter was introduced. Thoracentesis was performed on the left side. It was unable to be completely drained dry secondary to hypotension. The catheter was removed and a dressing applied. FINDINGS: A total of approximately 1.2 L of bloody fluid was removed. IMPRESSION: Successful ultrasound guided left  thoracentesis yielding 1.2 L of pleural fluid. I discussed this case with Dr. Barry Dienes who is the ordering physician. Unfortunately, due to persistent hypotension the procedure was terminated early on the left and unable to do the right-sided at all. She was agreeable with terminating in the procedure at this time. Read by: Saverio Danker, PA-C Electronically Signed   By: Aletta Edouard M.D.   On: 07/12/2016 11:20   Scheduled Meds: . [MAR Hold] sodium chloride   Intravenous Once  . [MAR Hold] sodium chloride   Intravenous Once  . [MAR Hold] sodium chloride   Intravenous Once  . [MAR Hold] aminocaproic acid (AMICAR) IVPB  2 g/hr Intravenous Q6H  . [MAR Hold] amiodarone  150 mg Intravenous Once  . [MAR Hold] atorvastatin  40 mg Oral q1800  . [MAR Hold] budesonide (PULMICORT) nebulizer solution  0.25 mg Nebulization BID  . [MAR Hold] carvedilol  3.125 mg Oral BID WC  . fibrin sealant component   Other Once  . [MAR Hold] insulin aspart  0-5 Units Subcutaneous QHS  . [MAR Hold] insulin aspart  0-9 Units Subcutaneous TID WC  . [MAR Hold] insulin glargine  22 Units Subcutaneous Daily  . [MAR Hold] ipratropium  0.5 mg Nebulization Q6H  . [MAR Hold] levalbuterol  1.25 mg Nebulization Q6H  . [MAR Hold] loratadine  10 mg Oral Daily  . [MAR Hold] magnesium oxide  400 mg Oral BID  . [MAR Hold] mouth rinse  15 mL Mouth Rinse BID  . [MAR Hold] multivitamin  15 mL Oral Daily  . [MAR Hold] pantoprazole  40 mg Oral Daily  . [MAR Hold] potassium chloride  20 mEq Oral Daily  . [MAR Hold] predniSONE  5 mg Oral Q breakfast   Continuous Infusions: . [MAR Hold] amiodarone 30 mg/hr (07/13/16 1145)  . diltiazem (CARDIZEM) infusion 5 mg/hr (07/13/16 1145)    LOS: 4 days   Georgina Quint  Lise Auer, DO [facility] Pager [phone]  If 7PM-7AM, please contact night-coverage [email or web address] Password [facility] 07/13/2016, 1:38 PM

## 2016-07-13 NOTE — H&P (View-Only) (Signed)
Reason for Consult:ITP Referring Physician: [name] PCP:  Stephen Seller, MD   [name] is an 76 y.o. male.   Chief complaint: Shortness of breath and dependent edema with uncontrolled atrial fibrillation with RVR.  HPI: 76 year old with multiple medical problems, including chronic shortness of breath over the last 4 months. He presented with atrial fibrillation RVR rates up in the 170 range with hypotension., shortness of breath and lower extremity edema. He was cardioverted in the emergency department, admitted by the Medicine service. He was Placed on amiodarone his electrolytes were corrected. Work up here shows Glucose of 192, CL 88, BUN 25 CT scan from 06/09/16: no adenopathy, large bilateral effusions, spleen was unremarkable. Significant: Coronary, aortic arch, and branch vessel atherosclerotic vascular disease. Bilateral chronic appearing maxillary sinusitis. Cervical, thoracic, and lumbar spondylosis. Degenerative arthropathy of both hips.  ,   Additional findings include bilateral pleural effusions with prior left thoracentesis 06/14/16.  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 Seen in consultation by [name] and [name].  Labs on admission shows a hemoglobin of 7 and hematocrit of 23. Platelet counts are less than 5000. Repeat study this a.m. is unchanged. [name] was contacted by Dr. [name]. he requested [name] see the patient and consider him for elective splenectomy, to be done laparoscopically.    Past Medical History:  Diagnosis Date  . Adenomatous colon polyp    01/2006  . Chronic pansinusitis   . COPD (chronic obstructive pulmonary disease) ([place])   . Diverticula of colon   . History of ITP   . History of nephrolithiasis   . Hyperlipemia   . Hypertension   . 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 Pleural effusion   . Sinus drainage    Cough with post nasal drip  . Thrombocytopenia ([place])   . Type 2 diabetes  mellitus ([place])     Past Surgical History:  Procedure Laterality Date  . COLONOSCOPY    . ESOPHAGOGASTRODUODENOSCOPY    . ESOPHAGOGASTRODUODENOSCOPY  05/19/2011   Procedure: ESOPHAGOGASTRODUODENOSCOPY (EGD);  Surgeon: Stephen Horner, MD;  Location: [facility];  Service: Endoscopy;  Laterality: [name]/A;  stat CBC  APC also needed  . HOT HEMOSTASIS  05/19/2011   Procedure: HOT HEMOSTASIS (ARGON PLASMA COAGULATION/BICAP);  Surgeon: Stephen Horner, MD;  Location: [facility];  Service: Endoscopy;  Laterality: [name]/A;  . Spinal cyst resection      Family History  Problem Relation Age of Onset  . Colon cancer Mother   . Heart disease Mother   . Liver cancer Father   . Heart disease Father     Social History:  reports that he quit smoking about 15 years ago. His smoking use included Cigarettes. He started smoking about 57 years ago. He has a 42.00 pack-year smoking history. He has never used smokeless tobacco. He reports that he drinks alcohol. He reports that he does not use drugs.  Allergies: No Known Allergies  Prior to Admission medications   Medication Sig Start Date End Date Taking? Authorizing Provider  albuterol (PROAIR HFA) 108 (90 Base) MCG/ACT inhaler 2 puffs every 4 hours as needed only  if your can't catch your breath 04/04/16  Yes Stephen Rockers, MD  aminocaproic acid (AMICAR) 500 MG tablet Take 4 tablets (2,000 mg total) by mouth every 8 (eight) hours. 06/27/16  Yes Stephen Lose, MD  bumetanide (BUMEX) 1 MG tablet Take 1 tablet (1 mg total) by mouth 2 (two) times daily.  06/27/16  Yes Stephen Lose, MD  chlorpheniramine-HYDROcodone (TUSSIONEX) 10-8 MG/5ML SUER Take 5 mLs by mouth at bedtime as needed for cough. 06/20/16  Yes Stephen Marisa Hua, MD  cyclophosphamide (CYTOXAN) 50 MG tablet Take 3 tablets (150 mg total) by mouth daily. Give on an empty stomach 1 hour before or 2 hours after meals. 07/03/16  Yes Stephen Lose, MD  ECHINACEA EXTRACT PO Take 1 capsule by mouth daily.   Yes  Historical Provider, MD  famotidine (PEPCID) 20 MG tablet One at bedtime Patient taking differently: Take 20 mg by mouth at bedtime.  02/02/16  Yes Stephen Rockers, MD  fexofenadine (ALLEGRA) 180 MG tablet Take 180 mg by mouth daily.   Yes Historical Provider, MD  GINKGO BILOBA PO Take 1 tablet by mouth daily.    Yes Historical Provider, MD  Glucosamine-Chondroit-Vit C-Mn (GLUCOSAMINE 1500 COMPLEX PO) Take 1,500 mg by mouth 2 (two) times daily.   Yes Historical Provider, MD  Insulin Glargine (TOUJEO SOLOSTAR) 300 UNIT/ML SOPN Inject 30 Units into the skin daily. 06/23/16  Yes Stephen Lose, MD  metFORMIN (GLUCOPHAGE) 1000 MG tablet Take 1,000 mg by mouth 2 (two) times daily with a meal.   Yes Historical Provider, MD  Multiple Vitamins-Minerals (MULTIVITAMIN PO) Take 1 tablet by mouth daily.    Yes Historical Provider, MD  pantoprazole (PROTONIX) 40 MG tablet Take 1 tablet (40 mg total) by mouth daily. Take 30-60 min before first meal of the day 05/03/16  Yes Stephen Rockers, MD  predniSONE (DELTASONE) 10 MG tablet Take 1 tablet (10 mg total) by mouth daily with breakfast. 06/23/16  Yes Stephen Lose, MD  Respiratory Therapy Supplies (FLUTTER) DEVI Use as directed 02/02/16  Yes Stephen Rockers, MD  simvastatin (ZOCOR) 80 MG tablet Take 80 mg by mouth daily.    Yes Historical Provider, MD  losartan (COZAAR) 50 MG tablet Take 1 tablet (50 mg total) by mouth daily. Patient not taking: Reported on 07/08/2016 06/23/16   Stephen Lose, MD     Results for orders placed or performed during the hospital encounter of 07/08/16 (from the past 48 hour([name]))  CBC with Differential     Status: Abnormal   Collection Time: 07/08/16  8:58 PM  Result Value Ref Range   WBC 9.1 4.0 - 10.5 K/uL   RBC 3.92 (L) 4.22 - 5.81 MIL/uL   Hemoglobin 11.2 (L) 13.0 - 17.0 g/dL   HCT 34.6 (L) 39.0 - 52.0 %   MCV 88.3 78.0 - 100.0 fL   MCH 28.6 26.0 - 34.0 pg   MCHC 32.4 30.0 - 36.0 g/dL   RDW 14.9 11.5 - 15.5 %   Platelets <5 (LL) 150 -  400 K/uL    Comment: CRITICAL RESULT CALLED TO, READ BACK BY AND VERIFIED WITH: [name] [phone] [name],[name]    Neutrophils Relative % 67 %   Lymphocytes Relative 25 %   Monocytes Relative 7 %   Eosinophils Relative 1 %   Basophils Relative 0 %   Neutro Abs 6.1 1.7 - 7.7 K/uL   Lymphs Abs 2.3 0.7 - 4.0 K/uL   Monocytes Absolute 0.6 0.1 - 1.0 K/uL   Eosinophils Absolute 0.1 0.0 - 0.7 K/uL   Basophils Absolute 0.0 0.0 - 0.1 K/uL   RBC Morphology POLYCHROMASIA PRESENT    WBC Morphology SPECIMEN CHECKED FOR CLOTS    Smear Review PLATELET COUNT CONFIRMED BY SMEAR   Comprehensive metabolic panel     Status: Abnormal   Collection  Time: 07/08/16  8:58 PM  Result Value Ref Range   Sodium 134 (L) 135 - 145 mmol/L   Potassium 3.7 3.5 - 5.1 mmol/L   Chloride 85 (L) 101 - 111 mmol/L   CO2 37 (H) 22 - 32 mmol/L   Glucose, Bld 206 (H) 65 - 99 mg/dL   BUN 32 (H) 6 - 20 mg/dL   Creatinine, Ser 1.23 0.61 - 1.24 mg/dL   Calcium 8.3 (L) 8.9 - 10.3 mg/dL   Total Protein 7.0 6.5 - 8.1 g/dL   Albumin 2.7 (L) 3.5 - 5.0 g/dL   AST 24 15 - 41 U/L   ALT 13 (L) 17 - 63 U/L   Alkaline Phosphatase 71 38 - 126 U/L   Total Bilirubin 0.4 0.3 - 1.2 mg/dL   GFR calc non Af Amer 56 (L) >60 mL/min   GFR calc Af Amer >60 >60 mL/min    Comment: (NOTE) The eGFR has been calculated using the CKD EPI equation. This calculation has not been validated in all clinical situations. eGFR'[name] persistently <60 mL/min signify possible Chronic Kidney Disease.    Anion gap 12 5 - 15  Troponin I     Status: None   Collection Time: 07/08/16  8:58 PM  Result Value Ref Range   Troponin I <0.03 <0.03 ng/mL  Brain natriuretic peptide     Status: Abnormal   Collection Time: 07/08/16 11:08 PM  Result Value Ref Range   B Natriuretic Peptide 113.8 (H) 0.0 - 100.0 pg/mL  POC CBG, ED     Status: Abnormal   Collection Time: 07/09/16  4:17 AM  Result Value Ref Range   Glucose-Capillary 172 (H) 65 - 99 mg/dL   Comment 1  Notify Chang   Cortisol-am, blood     Status: None   Collection Time: 07/09/16  5:25 AM  Result Value Ref Range   Cortisol - AM 9.5 6.7 - 22.6 ug/dL    Comment: Performed at [facility], [place] [address], [place], [place] [address]  TSH     Status: Abnormal   Collection Time: 07/09/16  5:26 AM  Result Value Ref Range   TSH 5.003 (H) 0.350 - 4.500 uIU/mL    Comment: Performed by a 3rd Generation assay with a functional sensitivity of <=0.01 uIU/mL.  Lipid panel     Status: None   Collection Time: 07/09/16  5:26 AM  Result Value Ref Range   Cholesterol 126 0 - 200 mg/dL   Triglycerides 110 <150 mg/dL   HDL 41 >40 mg/dL   Total CHOL/HDL Ratio 3.1 RATIO   VLDL 22 0 - 40 mg/dL   LDL Cholesterol 63 0 - 99 mg/dL    Comment:        Total Cholesterol/HDL:CHD Risk Coronary Heart Disease Risk Table                     Men   Women  1/2 Average Risk   3.4   3.3  Average Risk       5.0   4.4  2 X Average Risk   9.6   7.1  3 X Average Risk  23.4   11.0        Use the calculated Patient Ratio above and the CHD Risk Table to determine the patient'[name] CHD Risk.        ATP III CLASSIFICATION (LDL):  <100     mg/dL   Optimal  100-129  mg/dL  Near or Above                    Optimal  130-159  mg/dL   Borderline  160-189  mg/dL   High  >190     mg/dL   Very High Performed at [facility] [address], [place], [place] [address]   T4, free     Status: Abnormal   Collection Time: 07/09/16  5:28 AM  Result Value Ref Range   Free T4 1.39 (H) 0.61 - 1.12 ng/dL    Comment: (NOTE) Biotin ingestion may interfere with free T4 tests. If the results are inconsistent with the TSH level, previous test results, or the clinical presentation, then consider biotin interference. If needed, order repeat testing after stopping biotin. Performed at [facility], [place] [address] [place], [place] [address]   Magnesium     Status: Abnormal   Collection Time: 07/09/16  5:28 AM  Result Value  Ref Range   Magnesium 1.0 (L) 1.7 - 2.4 mg/dL  Troponin I (q 6hr x 3)     Status: None   Collection Time: 07/09/16  5:35 AM  Result Value Ref Range   Troponin I <0.03 <0.03 ng/mL  Protime-INR     Status: None   Collection Time: 07/09/16  5:36 AM  Result Value Ref Range   Prothrombin Time 14.9 11.4 - 15.2 seconds   INR 1.16   Hemoglobin A1c     Status: Abnormal   Collection Time: 07/09/16  5:36 AM  Result Value Ref Range   Hgb A1c MFr Bld 6.6 (H) 4.8 - 5.6 %    Comment: (NOTE)         Pre-diabetes: 5.7 - 6.4         Diabetes: >6.4         Glycemic control for adults with diabetes: <7.0    Mean Plasma Glucose 143 mg/dL    Comment: (NOTE) Performed At: [facility] [address] [place], [place] [id number] Lindon Romp MD WG:[id number]   CBG monitoring, ED     Status: Abnormal   Collection Time: 07/09/16  8:22 AM  Result Value Ref Range   Glucose-Capillary 211 (H) 65 - 99 mg/dL  Prealbumin     Status: Abnormal   Collection Time: 07/09/16 11:50 AM  Result Value Ref Range   Prealbumin 10.1 (L) 18 - 38 mg/dL    Comment: Performed at [facility], [place] [address] [place], [place] [address]  CBG monitoring, ED     Status: Abnormal   Collection Time: 07/09/16  1:36 PM  Result Value Ref Range   Glucose-Capillary 224 (H) 65 - 99 mg/dL  MRSA PCR Screening     Status: None   Collection Time: 07/09/16  4:17 PM  Result Value Ref Range   MRSA by PCR NEGATIVE NEGATIVE    Comment:        The GeneXpert MRSA Assay (FDA approved for NASAL specimens only), is one component of a comprehensive MRSA colonization surveillance program. It is not intended to diagnose MRSA infection nor to guide or monitor treatment for MRSA infections.   Glucose, capillary     Status: Abnormal   Collection Time: 07/09/16  4:33 PM  Result Value Ref Range   Glucose-Capillary 275 (H) 65 - 99 mg/dL  Troponin I (q 6hr x 3)     Status: None   Collection Time: 07/09/16  5:22 PM  Result  Value Ref  Range   Troponin I <0.03 <0.03 ng/mL  Glucose, capillary     Status: Abnormal   Collection Time: 07/09/16  9:14 PM  Result Value Ref Range   Glucose-Capillary 238 (H) 65 - 99 mg/dL  Basic metabolic panel     Status: Abnormal   Collection Time: 07/10/16  3:57 AM  Result Value Ref Range   Sodium 135 135 - 145 mmol/L   Potassium 4.2 3.5 - 5.1 mmol/L   Chloride 88 (L) 101 - 111 mmol/L   CO2 40 (H) 22 - 32 mmol/L   Glucose, Bld 192 (H) 65 - 99 mg/dL   BUN 25 (H) 6 - 20 mg/dL   Creatinine, Ser 0.85 0.61 - 1.24 mg/dL   Calcium 8.1 (L) 8.9 - 10.3 mg/dL   GFR calc non Af Amer >60 >60 mL/min   GFR calc Af Amer >60 >60 mL/min    Comment: (NOTE) The eGFR has been calculated using the CKD EPI equation. This calculation has not been validated in all clinical situations. eGFR'[name] persistently <60 mL/min signify possible Chronic Kidney Disease.    Anion gap 7 5 - 15  Magnesium     Status: None   Collection Time: 07/10/16  3:57 AM  Result Value Ref Range   Magnesium 1.7 1.7 - 2.4 mg/dL  CBC     Status: Abnormal   Collection Time: 07/10/16  3:57 AM  Result Value Ref Range   WBC 10.2 4.0 - 10.5 K/uL   RBC 2.58 (L) 4.22 - 5.81 MIL/uL   Hemoglobin 7.0 (L) 13.0 - 17.0 g/dL    Comment: DELTA CHECK NOTED REPEATED TO VERIFY    HCT 23.3 (L) 39.0 - 52.0 %   MCV 90.3 78.0 - 100.0 fL   MCH 27.1 26.0 - 34.0 pg   MCHC 30.0 30.0 - 36.0 g/dL   RDW 15.0 11.5 - 15.5 %   Platelets <5 (LL) 150 - 400 K/uL    Comment: REPEATED TO VERIFY PLATELET COUNT CONFIRMED BY SMEAR CRITICAL VALUE NOTED.  VALUE IS CONSISTENT WITH PREVIOUSLY REPORTED AND CALLED VALUE.   Type and screen [place]     Status: None (Preliminary result)   Collection Time: 07/10/16  5:35 AM  Result Value Ref Range   ABO/RH(D) A POS    Antibody Screen NEG    Sample Expiration 07/13/2016    Unit Number [id number]    Blood Component Type RED CELLS,LR    Unit division 00    Status of Unit ISSUED     Transfusion Status OK TO TRANSFUSE    Crossmatch Result Compatible   Prepare RBC     Status: None   Collection Time: 07/10/16  5:42 AM  Result Value Ref Range   Order Confirmation ORDER PROCESSED BY BLOOD BANK   CBC     Status: Abnormal   Collection Time: 07/10/16  7:53 AM  Result Value Ref Range   WBC 9.8 4.0 - 10.5 K/uL   RBC 2.59 (L) 4.22 - 5.81 MIL/uL   Hemoglobin 7.1 (L) 13.0 - 17.0 g/dL   HCT 23.5 (L) 39.0 - 52.0 %   MCV 90.7 78.0 - 100.0 fL   MCH 27.4 26.0 - 34.0 pg   MCHC 30.2 30.0 - 36.0 g/dL   RDW 15.0 11.5 - 15.5 %   Platelets <5 (LL) 150 - 400 K/uL    Comment: CRITICAL VALUE NOTED.  VALUE IS CONSISTENT WITH PREVIOUSLY REPORTED AND CALLED VALUE.  Glucose, capillary  Status: Abnormal   Collection Time: 07/10/16  8:00 AM  Result Value Ref Range   Glucose-Capillary 195 (H) 65 - 99 mg/dL  Glucose, capillary     Status: Abnormal   Collection Time: 07/10/16 12:13 PM  Result Value Ref Range   Glucose-Capillary 297 (H) 65 - 99 mg/dL    Dg Chest 2 View  Result Date: 07/08/2016 CLINICAL DATA:  Shortness of breath and atrial fibrillation. EXAM: CHEST  2 VIEW COMPARISON:  06/27/2016 FINDINGS: Moderate bilateral pleural effusions are again noted, slightly increased on the left. Bilateral lower lung atelectasis, pulmonary vascular congestion and probable interstitial edema again noted. There is no evidence of pneumothorax. No other significant changes identified. IMPRESSION: Pulmonary vascular congestion and probable interstitial edema again noted. Moderate bilateral pleural effusions, slightly increased on the left. Continued bilateral lower lung atelectasis. Electronically Signed   By: Margarette Canada M.D.   On: 07/08/2016 20:11    Review of Systems  Constitutional: Positive for malaise/fatigue and weight loss (10 lbs last 6 months, and 50 lbs over last 3 years). Negative for chills and fever.  HENT: Positive for hearing loss and nosebleeds (ongoing nose bleen now).   Eyes:        Reading glasses  Respiratory: Positive for cough and shortness of breath. Negative for hemoptysis, sputum production and wheezing.   Cardiovascular: Positive for palpitations (about 4 months, thought to  be SVT before this), orthopnea, leg swelling and PND. Negative for claudication.  Gastrointestinal: Positive for heartburn (on  PPI). Negative for abdominal pain, blood in stool, constipation, diarrhea, melena, nausea and vomiting.  Genitourinary: Negative.   Musculoskeletal: Positive for joint pain.  Skin: Negative.   Neurological: Positive for weakness.  Endo/Heme/Allergies: Bruises/bleeds easily.  Psychiatric/Behavioral: Positive for depression. The patient is nervous/anxious.    Blood pressure 120/66, pulse (!) 126, temperature 98.8 F (37.1 C), temperature source Oral, resp. rate 19, height '5\' 11"'$  (1.803 m), weight 89.6 kg (197 lb 8.5 oz), SpO2 96 %. Physical Exam  Constitutional: He is oriented to person, place, and time.  Chronically ill male up in chair, SOB with ongoing nose bleed.  Bilateral lower leg edema.  Eyes: Right eye exhibits no discharge. Left eye exhibits no discharge. No scleral icterus.  Neck: Normal range of motion. No JVD present. No tracheal deviation present. No thyromegaly present.  Cardiovascular: Normal heart sounds.   No murmur heard. AF irregular rhythm remains tachycardic sitting up in chair. He has +2-3 edema lower legs and feet, I cannot feel pulses, but feet are warm and perfused.  Respiratory: Effort normal. No respiratory distress. He has no wheezes. He has no rales. He exhibits no tenderness.  BS down in both bases  GI: Soft. Bowel sounds are normal. He exhibits no distension and no mass. There is no tenderness. There is no rebound and no guarding.  Musculoskeletal: He exhibits edema (+2-3 both lower legs and feet). He exhibits no tenderness.  Lymphadenopathy:    He has no cervical adenopathy.  Neurological: He is alert and oriented to person, place,  and time. No cranial nerve deficit.  Skin: Skin is warm. No rash noted. No erythema. No pallor.  Psychiatric: He has a normal mood and affect. His behavior is normal. Judgment and thought content normal.  He is pretty depressed about his condition.  He was fine 4-5 months ago and now everything is going wrong.      Assessment/Plan: 1.  Chronic/refractory immune thrombocytopenic purpura 2.  Atrial fibrillation with RVR  -  normal EF with grade 2 diastolic dysfunction, calcified mitral annulus with trivial MR. Trivial TR 3.  R>L pleural effusions/status post ultrasound-guided left thoracentesis 06/14/16;1.5 liters Pathology: REACTIVE MESOTHELIAL CELLS PRESENT, MIXED LYMPHOID POPULATION. 4.  COPD/History tobacco use - 42-pack-year history 5.  Hypertension 6.  Hyperlipidemia 7.  Type 2 diabetes mellitus. 8.  Bilateral lower extremity edema/fluid overload  Plan:  StephenByerly will review and discuss laparoscopic splenectomy when he is medically stable for the procedure.  He has a Type and screen in the system currently.    Shayann Garbutt 07/10/2016, 2:00 PM

## 2016-07-14 ENCOUNTER — Inpatient Hospital Stay (HOSPITAL_COMMUNITY): Payer: Medicare HMO

## 2016-07-14 ENCOUNTER — Encounter (HOSPITAL_COMMUNITY): Payer: Self-pay | Admitting: General Surgery

## 2016-07-14 DIAGNOSIS — Z9889 Other specified postprocedural states: Secondary | ICD-10-CM | POA: Insufficient documentation

## 2016-07-14 LAB — TYPE AND SCREEN
ABO/RH(D): A POS
Antibody Screen: NEGATIVE
Unit division: 0
Unit division: 0
Unit division: 0
Unit division: 0
Unit division: 0

## 2016-07-14 LAB — BPAM RBC
Blood Product Expiration Date: 201803302359
Blood Product Expiration Date: 201804032359
Blood Product Expiration Date: 201804032359
Blood Product Expiration Date: 201804042359
Blood Product Expiration Date: 201804042359
ISSUE DATE / TIME: 201803121024
ISSUE DATE / TIME: 201803141638
ISSUE DATE / TIME: 201803151045
ISSUE DATE / TIME: 201803151045
Unit Type and Rh: 6200
Unit Type and Rh: 6200
Unit Type and Rh: 6200
Unit Type and Rh: 6200
Unit Type and Rh: 6200

## 2016-07-14 LAB — GLUCOSE, CAPILLARY
GLUCOSE-CAPILLARY: 204 mg/dL — AB (ref 65–99)
GLUCOSE-CAPILLARY: 204 mg/dL — AB (ref 65–99)
Glucose-Capillary: 172 mg/dL — ABNORMAL HIGH (ref 65–99)
Glucose-Capillary: 236 mg/dL — ABNORMAL HIGH (ref 65–99)
Glucose-Capillary: 217 mg/dL — ABNORMAL HIGH (ref 65–99)

## 2016-07-14 LAB — BLOOD GAS, ARTERIAL
Acid-Base Excess: 8 mmol/L — ABNORMAL HIGH (ref 0.0–2.0)
Acid-Base Excess: 12.2 mmol/L — ABNORMAL HIGH (ref 0.0–2.0)
BICARBONATE: 39.6 mmol/L — AB (ref 20.0–28.0)
Bicarbonate: 37.5 mmol/L — ABNORMAL HIGH (ref 20.0–28.0)
DRAWN BY: 441261
Drawn by: 33147
FIO2: 50
O2 Content: 7 L/min
O2 Saturation: 97.4 %
O2 Saturation: 98.2 %
PATIENT TEMPERATURE: 37
PATIENT TEMPERATURE: 37
PCO2 ART: 52.1 mmHg — AB (ref 32.0–48.0)
PEEP: 5 cmH2O
PH ART: 7.194 — AB (ref 7.350–7.450)
PO2 ART: 95 mmHg (ref 83.0–108.0)
RATE: 22 resp/min
VT: 600 mL
pCO2 arterial: 107 mmHg (ref 32.0–48.0)
pH, Arterial: 7.471 — ABNORMAL HIGH (ref 7.350–7.450)
pO2, Arterial: 113 mmHg — ABNORMAL HIGH (ref 83.0–108.0)

## 2016-07-14 LAB — PREPARE PLATELET PHERESIS
UNIT DIVISION: 0
Unit division: 0
Unit division: 0
Unit division: 0
Unit division: 0

## 2016-07-14 LAB — COMPREHENSIVE METABOLIC PANEL
ALT: 13 U/L — AB (ref 17–63)
ANION GAP: 9 (ref 5–15)
AST: 28 U/L (ref 15–41)
Albumin: 2.6 g/dL — ABNORMAL LOW (ref 3.5–5.0)
Alkaline Phosphatase: 71 U/L (ref 38–126)
BILIRUBIN TOTAL: 0.7 mg/dL (ref 0.3–1.2)
BUN: 18 mg/dL (ref 6–20)
CALCIUM: 8.2 mg/dL — AB (ref 8.9–10.3)
CO2: 36 mmol/L — ABNORMAL HIGH (ref 22–32)
CREATININE: 0.91 mg/dL (ref 0.61–1.24)
Chloride: 93 mmol/L — ABNORMAL LOW (ref 101–111)
GFR calc non Af Amer: >60 mL/min (ref >60)
GLUCOSE: 212 mg/dL — AB (ref 65–99)
Potassium: 4.2 mmol/L (ref 3.5–5.1)
Sodium: 138 mmol/L (ref 135–145)
TOTAL PROTEIN: 6.2 g/dL — AB (ref 6.5–8.1)

## 2016-07-14 LAB — BPAM PLATELET PHERESIS
BLOOD PRODUCT EXPIRATION DATE: 201803172359
BLOOD PRODUCT EXPIRATION DATE: 201803172359
BLOOD PRODUCT EXPIRATION DATE: 201803172359
Blood Product Expiration Date: 201803161655
Blood Product Expiration Date: 201803172359
ISSUE DATE / TIME: 201803150949
ISSUE DATE / TIME: 201803151525
ISSUE DATE / TIME: 201803151714
ISSUE DATE / TIME: 201803151106
ISSUE DATE / TIME: 201803151613
UNIT TYPE AND RH: 600
UNIT TYPE AND RH: 6200
UNIT TYPE AND RH: 6200
Unit Type and Rh: 6200
Unit Type and Rh: 6200

## 2016-07-14 LAB — LACTATE DEHYDROGENASE, PLEURAL OR PERITONEAL FLUID: LD FL: 643 U/L — AB (ref 3–23)

## 2016-07-14 LAB — MAGNESIUM: Magnesium: 1.6 mg/dL — ABNORMAL LOW (ref 1.7–2.4)

## 2016-07-14 LAB — BODY FLUID CELL COUNT WITH DIFFERENTIAL
LYMPHS FL: 76 %
Monocyte-Macrophage-Serous Fluid: 6 % — ABNORMAL LOW (ref 50–90)
NEUTROPHIL FLUID: 18 % (ref 0–25)
WBC FLUID: 720 uL (ref 0–1000)

## 2016-07-14 LAB — CBC WITH DIFFERENTIAL/PLATELET
BASOS ABS: 0 10*3/uL (ref 0.0–0.1)
BASOS PCT: 0 %
EOS ABS: 0 10*3/uL (ref 0.0–0.7)
EOS PCT: 0 %
HCT: 36.7 % — ABNORMAL LOW (ref 39.0–52.0)
Hemoglobin: 11.4 g/dL — ABNORMAL LOW (ref 13.0–17.0)
Lymphocytes Relative: 2 %
Lymphs Abs: 0.4 10*3/uL — ABNORMAL LOW (ref 0.7–4.0)
MCH: 27.3 pg (ref 26.0–34.0)
MCHC: 31.1 g/dL (ref 30.0–36.0)
MCV: 87.8 fL (ref 78.0–100.0)
MONO ABS: 0.5 10*3/uL (ref 0.1–1.0)
Monocytes Relative: 3 %
Neutro Abs: 14.8 10*3/uL — ABNORMAL HIGH (ref 1.7–7.7)
Neutrophils Relative %: 95 %
Platelets: 80 10*3/uL — ABNORMAL LOW (ref 150–400)
RBC: 4.18 MIL/uL — ABNORMAL LOW (ref 4.22–5.81)
RDW: 16.6 % — AB (ref 11.5–15.5)
WBC: 15.7 10*3/uL — ABNORMAL HIGH (ref 4.0–10.5)

## 2016-07-14 LAB — PHOSPHORUS: Phosphorus: 4.5 mg/dL (ref 2.5–4.6)

## 2016-07-14 LAB — CHOLESTEROL, TOTAL: CHOLESTEROL: 115 mg/dL (ref 0–200)

## 2016-07-14 MED ORDER — VANCOMYCIN HCL 10 G IV SOLR
1250.0000 mg | Freq: Two times a day (BID) | INTRAVENOUS | Status: DC
  Administered 2016-07-15 – 2016-07-16 (×3): 1250 mg via INTRAVENOUS
  Filled 2016-07-14 (×3): qty 1250

## 2016-07-14 MED ORDER — PIPERACILLIN-TAZOBACTAM 3.375 G IVPB
3.3750 g | Freq: Three times a day (TID) | INTRAVENOUS | Status: DC
  Administered 2016-07-15 – 2016-07-16 (×4): 3.375 g via INTRAVENOUS
  Filled 2016-07-14 (×6): qty 50

## 2016-07-14 MED ORDER — LIDOCAINE HCL 1 % IJ SOLN
INTRAMUSCULAR | Status: AC
  Administered 2016-07-14: 20 mL
  Filled 2016-07-14: qty 20

## 2016-07-14 MED ORDER — SODIUM CHLORIDE 0.9 % IV SOLN
1500.0000 mg | Freq: Once | INTRAVENOUS | Status: AC
  Administered 2016-07-14: 1500 mg via INTRAVENOUS
  Filled 2016-07-14 (×2): qty 1500

## 2016-07-14 MED ORDER — FENTANYL CITRATE (PF) 100 MCG/2ML IJ SOLN
INTRAMUSCULAR | Status: AC
  Administered 2016-07-14: 100 ug
  Filled 2016-07-14: qty 2

## 2016-07-14 MED ORDER — MIDAZOLAM HCL 2 MG/2ML IJ SOLN
INTRAMUSCULAR | Status: AC
  Administered 2016-07-14: 2 mg
  Filled 2016-07-14: qty 2

## 2016-07-14 MED ORDER — PIPERACILLIN-TAZOBACTAM 3.375 G IVPB 30 MIN
3.3750 g | Freq: Once | INTRAVENOUS | Status: AC
  Administered 2016-07-14: 3.375 g via INTRAVENOUS
  Filled 2016-07-14: qty 50

## 2016-07-14 MED ORDER — NOREPINEPHRINE 4 MG/250ML-% IV SOLN
0.0000 ug/min | INTRAVENOUS | Status: DC
  Administered 2016-07-14: 11 ug/min via INTRAVENOUS
  Administered 2016-07-14: 2 ug/min via INTRAVENOUS
  Administered 2016-07-15: 17 ug/min via INTRAVENOUS
  Administered 2016-07-15: 14 ug/min via INTRAVENOUS
  Administered 2016-07-15 (×2): 18 ug/min via INTRAVENOUS
  Administered 2016-07-15 (×2): 12 ug/min via INTRAVENOUS
  Filled 2016-07-14 (×7): qty 250

## 2016-07-14 NOTE — Progress Notes (Signed)
   07/14/16 1300  Clinical Encounter Type  Visited With Family  Visit Type Initial;Psychological support;Spiritual support;Critical Care  Referral From Nurse  Consult/Referral To Chaplain  Spiritual Encounters  Spiritual Needs Emotional;Prayer;Other (Comment) (Pastoral Conversation/Support)  Stress Factors  Patient Stress Factors Not reviewed  Family Stress Factors Health changes;Major life changes   Visited with the patient's wife and daughter per referral from the nurse.  The family are having a hard time with the patient's health condition and requested prayer.  We prayed at the bedside.  Please, contact Spiritual Care for further assistance.  [place] M.Div.

## 2016-07-14 NOTE — Progress Notes (Signed)
Patient requesting to take BiPAP off. Patient placed on HFNC at 15L; current sats 88-89%. Patient is not in distress at this time. BiPAP on standby. RT will continue to monitor patient.

## 2016-07-14 NOTE — Procedures (Signed)
Thoracentesis Procedure Note  Pre-operative Diagnosis: right effusion   Post-operative Diagnosis: exudative right effusion   Indications: evaluation and treatment of dyspnea   Procedure Details  Consent: Informed consent was obtained. Risks of the procedure were discussed including: infection, bleeding, pain, pneumothorax.  Under sterile conditions the patient was positioned. Betadine solution and sterile drapes were utilized.  1% buffered lidocaine was used to anesthetize the  rib space on the right which was evaluated by real time [place]. Fluid was obtained without any difficulties and minimal blood loss.  A dressing was applied to the wound and wound care instructions were provided.   Findings 600 ml of thick, old bloody pleural fluid was obtained. A sample was sent to Pathology, cell counts, as well as for infection analysis. We stopped at 600 ml as fluid was thick and there was marked fibrinous changes on CXR so felt CT drainage was better option  Complications:  None; patient tolerated the procedure well.          Condition: stable  Plan A follow up chest x-ray was ordered. Bed Rest for 0 hours. Tylenol 650 mg. for pain.   Erick Colace ACNP-BC [place] Pager # [phone] OR # [phone] if no answer

## 2016-07-14 NOTE — Progress Notes (Signed)
Pharmacy Antibiotic Note  [name] is a 76 year old with PMH recurrent bilateral pleural effusions, hypertension, COPD, diabetes, bronchiectasis admitted for splenectomy for ITP management. BL chest tubes placed for drainage of recurrent pleural effusions; currently intubated. Suspicion for bilateral PNA with empyema. Pharmacy consulted to dose vancomycin and Zosyn  Plan:  Vancomycin 1500 mg IV now, then 1250 mg IV q12 hr; goal trough 15-20 mcg/mL. Using slightly lower dose than recommended by nomogram to avoid q8 hr dosing; patient is 75 and with borderline UOP (0.6 ml/kg/hr)  Measure vancomycin trough levels at steady state as indicated  Zosyn 3.375 g IV given once over 30 minutes, then every 8 hrs by 4-hr infusion  Daily SCr while on vanc/Zosyn  Patient with recent negative MRSA PCR and GNR growing in pleural fluid - consider stopping vancomycin if clinically warranted  Height: 5\' 11"  (180.3 cm) Weight: 204 lb 12.9 oz (92.9 kg) IBW/kg (Calculated) : 75.3  Temp (24hrs), Avg:97.6 F (36.4 C), Min:93.5 F (34.2 C), Max:99.6 F (37.6 C)   Recent Labs Lab 07/10/16 0357  07/11/16 0353 07/12/16 1416 07/12/16 1638 07/12/16 2320 07/13/16 0341 07/14/16 0522  WBC 10.2  < > 9.8  --  9.6 8.8 9.5 15.7*  CREATININE 0.85  --  0.79 0.78  --   --  0.75 0.91  < > = values in this interval not displayed.  Estimated Creatinine Clearance: 82.9 mL/min (by C-G formula based on SCr of 0.91 mg/dL).    No Known Allergies  Antimicrobials this admission: vancomycin 3/16 >>  Zosyn 3/16 >>   Dose adjustments this admission: ---  Microbiology results: 3/16 BCx: sent 3/16 UCx: sent  3/16 Sputum: sent  316 Pleural fluid: rare GNR 3/11 MRSA PCR: neg  Thank you for allowing pharmacy to be a part of this patient's care.  Reuel Boom, PharmD, BCPS Pager: [phone] 07/14/2016, 7:45 PM

## 2016-07-14 NOTE — Progress Notes (Signed)
Pt self extubated around 11:45 pm on 3/16. Pt on room air dropped to 87% o2 sat. Pt placed on non rebreather and o2 sat increased to 98% within seconds. Patient alert, oriented, and is able to communicate. RT, Warren Lacy, and charge nurse all made aware. MD was called and wants patient to be monitored closely. Pt will not be re-intubated right now, instead we will monitor vital signs and look out for any significant respiratory changes.

## 2016-07-14 NOTE — Progress Notes (Signed)
1 Day Post-Op  Subjective: Pain controlled.  Had thoracentesis of 600 mL, but fluid very thick.  PCCM NP placing chest tube for better drainage.  He was extubated, but now 85-90% on facemask.    Objective: Vital signs in last 24 hours: Temp:  [93.5 F (34.2 C)-99.6 F (37.6 C)] 97.5 F (36.4 C) (03/16 0800) Pulse Rate:  [76-104] 92 (03/16 1000) Resp:  [12-32] 31 (03/16 1000) BP: (39-166)/(14-109) 146/61 (03/16 1000) SpO2:  [86 %-100 %] 89 % (03/16 1000) Arterial Line BP: (105-183)/(49-108) 132/57 (03/16 1000) FiO2 (%):  [70 %-100 %] 100 % (03/16 0800) Weight:  [92.9 kg (204 lb 12.9 oz)] 92.9 kg (204 lb 12.9 oz) (03/16 0455) Last BM Date:  (unknown)  Intake/Output from previous day: 03/15 0701 - 03/16 0700 In: 5750.4 [I.V.:4121.4; Blood:1379; IV Piggyback:250] Out: 1250 [Urine:1250] Intake/Output this shift: No intake/output data recorded.  General appearance: dyspneic Resp: tachypnea, retractions Cardio: regular rate and rhythm GI: soft, mildly distended, non tender Wounds OK.  Lab Results:   Recent Labs  07/13/16 0341 07/13/16 1227 07/14/16 0522  WBC 9.5  --  15.7*  HGB 10.2* 9.5* 11.4*  HCT 33.4* 28.0* 36.7*  PLT 49*  --  80*   BMET  Recent Labs  07/13/16 0341 07/13/16 1227 07/14/16 0522  NA 138 135 138  K 4.0 4.1 4.2  CL 86*  --  93*  CO2 43*  --  36*  GLUCOSE 225*  --  212*  BUN 15  --  18  CREATININE 0.75  --  0.91  CALCIUM 8.7*  --  8.2*   PT/INR No results for input(s): LABPROT, INR in the last 72 hours. ABG  Recent Labs  07/13/16 1227 07/13/16 1552  PHART 7.451* 7.370  HCO3 46.4* 36.7*    Studies/Results: Portable Chest Xray  Result Date: 07/14/2016 CLINICAL DATA:  Respiratory failure. EXAM: PORTABLE CHEST 1 VIEW COMPARISON:  07/13/2016. FINDINGS: Interim extubation removal of NG tube. Heart size stable. Pulmonary venous congestion and bilateral pulmonary infiltrates with bilateral pleural effusions suggesting CHF. Bilateral pneumonia  cannot be excluded. These findings unchanged prior exam. No pneumothorax. IMPRESSION: 1. Interim extubation removal of NG tube. 2. Pulmonary venous congestion and bilateral pulmonary infiltrates with bilateral pleural effusions again noted. Findings suggest congestive heart failure. Bilateral pneumonia cannot be excluded. No interim change from prior exam. Electronically Signed   By: Marcello Moores  Register   On: 07/14/2016 06:52   Portable Chest Xray  Result Date: 07/13/2016 CLINICAL DATA:  Respiratory failure.  Endotracheal tube placement. EXAM: PORTABLE CHEST 1 VIEW COMPARISON:  07/12/2016 and prior radiograph FINDINGS: An endotracheal tube is identified with tip 5 cm above the carina. An NG tube is present with tip difficult to visualize but appears to overlie the distal esophagus. Moderate bilateral pleural effusions, right greater than left, and bilateral lower lung atelectasis/ airspace disease again noted. Pulmonary vascular congestion is present. There is no evidence of pneumothorax. IMPRESSION: Support apparatus as described. NG tube tip difficult to visualize but appears to overlie the lower esophagus. Moderate pleural effusions and bilateral lower lung atelectasis/ airspace disease again noted. Electronically Signed   By: Margarette Canada M.D.   On: 07/13/2016 17:39    Anti-infectives: Anti-infectives    None      Assessment/Plan: s/p Procedure(s): LAPAROSCOPIC SPLENECTOMY (N/A) if respiratory status improves, OK to have sips or clears if no N/V.  May be nauseated for a day or two given spleen proximity to stomach.   Respiratory status per CCM.  LOS: 5 days    [facility] 07/14/2016

## 2016-07-14 NOTE — Progress Notes (Signed)
Bilat CT insertion. 32 CT placed in L lateral rib space. 1600 mL serosanguineous fluid drained. Pt tolerated well.  Will continue to monitor  32 CT placed in right lateral rib space 929mL serosanguineous fluid obtained.

## 2016-07-14 NOTE — Progress Notes (Signed)
Date:  July 14, 2016 Chart reviewed for concurrent status and case management needs. Will continue to follow patient progress. Discharge Planning: following for needs Expected discharge date: [id number] Rhonda Davis, BSN, RN3, CCM   [phone] 

## 2016-07-14 NOTE — Consult Note (Signed)
PULMONARY / CRITICAL CARE MEDICINE   Name: [name] MRN: [id number] DOB: January 06, 1941    ADMISSION DATE:  07/08/2016 CONSULTATION DATE: 3/15  REFERRING MD:  Barry Dienes   CHIEF COMPLAINT:  Vent management    SUBJECTIVE:  Increased WOB this am. A little better after right thora.   VITAL SIGNS: BP (!) 146/61   Pulse 92   Temp 97.5 F (36.4 C) (Oral)   Resp (!) 31   Ht 5\' 11"  (1.803 m)   Wt 204 lb 12.9 oz (92.9 kg)   SpO2 (!) 89%   BMI 28.56 kg/m  100% NRB HEMODYNAMICS:    VENTILATOR SETTINGS: Vent Mode: BIPAP;PCV FiO2 (%):  [70 %-100 %] 100 % Set Rate:  [14 bmp-15 bmp] 15 bmp Vt Set:  [600 mL] 600 mL PEEP:  [5 cmH20-6 cmH20] 6 cmH20 Plateau Pressure:  [21 cmH20-23 cmH20] 21 cmH20  INTAKE / OUTPUT: I/O last 3 completed shifts: In: 6212.6 [I.V.:4258.6; Blood:1704; IV Piggyback:250] Out: 1500 [Urine:1500]  PHYSICAL EXAMINATION: General appearance:  Frail 76 Year old  Male well nourished, lethargic and labored Eyes: anicteric sclerae icteric, moist conjunctivae; PERRL, EOMI bilaterally. Mouth:  membranes and no mucosal ulcerations; normal hard and soft palate, thick yellow sputum  Neck: Trachea midline; neck supple, no JVD Lungs/chest: scattered rhonchi. w/ labored respiratory effort and no intercostal retractions prior to CTs. Now a little better. Good bilateral tidal in CTs w/ thick old bloody appearing fluid. No air leak  Bilateral CTs w/  CV: regular irreg , no MRGs  Abdomen: Soft, non-tender; no masses or HSM Extremities: + edema Skin: Normal temperature, turgor and texture; no rash, ulcers or subcutaneous nodules Neuro/Psych: sedated, but opens eyes and now trying to talk more. Oriented x2. Strength equal bilateral  LABS:  BMET  Recent Labs Lab 07/12/16 1416 07/13/16 0341 07/13/16 1227 07/14/16 0522  NA 137 138 135 138  K 3.5 4.0 4.1 4.2  CL 85* 86*  --  93*  CO2 44* 43*  --  36*  BUN 18 15  --  18  CREATININE 0.78 0.75  --  0.91  GLUCOSE 189* 225*   --  212*    Electrolytes  Recent Labs Lab 07/12/16 1416 07/13/16 0341 07/14/16 0522  CALCIUM 8.7* 8.7* 8.2*  MG 1.4* 1.5* 1.6*  PHOS 3.0 3.6 4.5    CBC  Recent Labs Lab 07/12/16 2320 07/13/16 0341 07/13/16 1227 07/14/16 0522  WBC 8.8 9.5  --  15.7*  HGB 9.3* 10.2* 9.5* 11.4*  HCT 30.4* 33.4* 28.0* 36.7*  PLT 43* 49*  --  80*    Coag's  Recent Labs Lab 07/09/16 0536  INR 1.16    Sepsis Markers No results for input(s): LATICACIDVEN, PROCALCITON, O2SATVEN in the last 168 hours.  ABG  Recent Labs Lab 07/13/16 1227 07/13/16 1552  PHART 7.451* 7.370  PCO2ART 66.6* 65.1*  PO2ART 121.0* 172*    Liver Enzymes  Recent Labs Lab 07/12/16 1416 07/13/16 0341 07/14/16 0522  AST 19 18 28   ALT 14* 15* 13*  ALKPHOS 75 78 71  BILITOT 0.5 0.8 0.7  ALBUMIN 2.8* 2.6* 2.6*    Cardiac Enzymes  Recent Labs Lab 07/08/16 2058 07/09/16 0535 07/09/16 1722  TROPONINI <0.03 <0.03 <0.03    Glucose  Recent Labs Lab 07/12/16 2127 07/13/16 0821 07/13/16 1547 07/13/16 2002 07/13/16 2309 07/14/16 0421  GLUCAP 160* 209* 158* 171* 160* 204*    Imaging Portable Chest Xray  Result Date: 07/14/2016 CLINICAL DATA:  Respiratory failure. EXAM: PORTABLE  CHEST 1 VIEW COMPARISON:  07/13/2016. FINDINGS: Interim extubation removal of NG tube. Heart size stable. Pulmonary venous congestion and bilateral pulmonary infiltrates with bilateral pleural effusions suggesting CHF. Bilateral pneumonia cannot be excluded. These findings unchanged prior exam. No pneumothorax. IMPRESSION: 1. Interim extubation removal of NG tube. 2. Pulmonary venous congestion and bilateral pulmonary infiltrates with bilateral pleural effusions again noted. Findings suggest congestive heart failure. Bilateral pneumonia cannot be excluded. No interim change from prior exam. Electronically Signed   By: Marcello Moores  Register   On: 07/14/2016 06:52   Portable Chest Xray  Result Date: 07/13/2016 CLINICAL DATA:   Respiratory failure.  Endotracheal tube placement. EXAM: PORTABLE CHEST 1 VIEW COMPARISON:  07/12/2016 and prior radiograph FINDINGS: An endotracheal tube is identified with tip 5 cm above the carina. An NG tube is present with tip difficult to visualize but appears to overlie the distal esophagus. Moderate bilateral pleural effusions, right greater than left, and bilateral lower lung atelectasis/ airspace disease again noted. Pulmonary vascular congestion is present. There is no evidence of pneumothorax. IMPRESSION: Support apparatus as described. NG tube tip difficult to visualize but appears to overlie the lower esophagus. Moderate pleural effusions and bilateral lower lung atelectasis/ airspace disease again noted. Electronically Signed   By: Margarette Canada M.D.   On: 07/13/2016 17:39     STUDIES/events Splenectomy 3/15.  3/15 self extubaetd Right thora 3/16.Marland Kitchen 800 clearly exudate. Appears like old blood Pleural cytology 3/16>>> 3/16 bilateral chest tubes placed d/t old bloody appearing effusions w/ significant fibrinous debris noted on [place].     CULTURES: Pleural fluid 3/16>>>  ANTIBIOTICS:   SIGNIFICANT EVENTS: Left thora 1.2 blood tinged fluid   LINES/TUBES: OETT 3/15>>>3/16 (self extubated) Bilateral Chest tubes 3/16   ASSESSMENT / PLAN:  Acute on chronic respiratory failure in setting of large bilateral Exudative Pleural effusions, superimposed un underlying COPD and BTX.  -self extubated 3/16 -the etiology if these effusions are unclear. They are exudate for certain. On last tap: mixed lymphoid and reactive mesos.  -placed Bilateral Chest tubes 3/16. Effusions appear like old blood. Wonder if he had bleed at some point. Lots of fibrinous changes on [place] eval  Plan Both CTs to 20 cm H2O suction  Repeat CXR now that CTs placed & repeat daily  Repeat ABG-->more lethargic after CT placement. If hypercarbic may need to re-intubate.  Titrate O2 for sats 88-92% f/u Fluid analysis.   May need repeat CT imaging of chest to further eval pleural space. 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  Thrombocytopenia -->improved (got platelets 3/15) Blood loss anemia  -no active bleeding currently Plan Repeat CBC Transfuse PRN SCDs Further recs per heme  Post-op care per surg Cont pred via tube  PAF. Currently w/ CVR Plan Cont amio and cardiazem per cards   Diastolic HF Plan Even volume status Tele   Pain, mild acute encephalopathy.  -may be in part d/t rising CO2. Plan Limit sedation unless intubated Ck ABG Cont supportive care  Leukocytosis  -does have thick yellow nasal and oral secretions. Certainly aspiration risk w/ poor cough mechanics.  Plan Trend fever curve PCT algo If intubated get sputum Low threshold to start empiric nosocomial coverage.   DM w/ hyperglycemia  Plan ssi   DVT prophylaxis: SCD SUP: PPI Diet: NPO Activity: bedrest Disposition : ICU  Discussion  Very weak. Self extubated last night. We placed bilateral chest tubes as the effusions appeared like old bloody effusions w/ fibrinous debris via [place] and on the right effusion the fluid was very thick and difficult to remove by thoracentesis alone. At time of this note he has drained 1.5 liters from the right and about 1.3 liters from the left. The concern at this point is his cough mechanics, underlying COPD and risk for re-intubation d/t this.  Plan CTs to drainage f/u fluid analysis Watch pulm status closely for s/sx of decomp and need for re-intubation Watch for s/sx of infection as wbc climbing. Low threshold for abx Consider CT imaging of chest when stable now that CTs in. If not fully drained may need TNA/DNase.  Cont other post-op care per surgical service.   DVT prophylaxis: scd SUP: ppi Diet:  npo Activity: bedrest Disposition : icu  My critical care time 60 minutes,   Erick Colace ACNP-BC [place] Pager # [phone] OR # [phone] if no answer    07/14/2016, 11:00 AM

## 2016-07-14 NOTE — Procedures (Signed)
Intubation Procedure Note [name] [id number] 1940/09/14  Procedure: Intubation Indications: Airway protection and maintenance  Procedure Details Consent: Risks of procedure as well as the alternatives and risks of each were explained to the (patient/caregiver).  Consent for procedure obtained. Time Out: Verified patient identification, verified procedure, site/side was marked, verified correct patient position, special equipment/implants available, medications/allergies/relevent history reviewed, required imaging and test results available.  Performed  Miller and 3 , glide scope 7.5 ETT inserted. Position at lip 25  Evaluation Hemodynamic Status: Transient hypotension treated with pressors; O2 sats: stable throughout Patient's Current Condition: stable Complications: No apparent complications Patient did tolerate procedure well. Chest X-ray ordered to verify placement.  CXR: pending.   [name] 07/14/2016

## 2016-07-14 NOTE — Progress Notes (Signed)
Initial Nutrition Assessment  DOCUMENTATION CODES:   Not applicable  INTERVENTION:  - If pt to remain intubated >/= 24 hours and TF appropriate at that time, recommend Osmolite 1.5 @ 50 mL/hr with 30 mL Prostat TID. This regimen will provide 2100 kcal, 120 grams of protein, and 914 mL free water.  NUTRITION DIAGNOSIS:   Inadequate oral intake related to inability to eat as evidenced by NPO status.  GOAL:   Patient will meet greater than or equal to 90% of their needs  MONITOR:   Vent status, Weight trends, Labs, I & O's  REASON FOR ASSESSMENT:   Ventilator  ASSESSMENT:   76 y.o. male with medical history significant of hypertension, hyperlipidemia, diabetes mellitus, COPD, GERD, idiopathic thrombocytopenia, bilateral pleural effusion, bronchiectasis, PSVT, dCHF, who presents with shortness of breath and bilateral leg edema.  Pt seen for new vent. BMI indicates overweight status. Weight slightly up since admission and admission weight (91.2 kg) used in estimating needs. Pt POD #1 lap splenectomy. He had thoracentesis this AM with 600cc thick, old blood-like drainage. Pt was intubated ~1 hour ago and OGT placed at that time. Family member in the room on the phone with back to the door, looking out the window. RD attempted to knock twice with no response.   Unable to obtain PTA information and did not perform physical assessment as would like to obtain consent from family. Per chart review, pt previously on Heart Healthy/Carb Modified diet and consumed 95% of all meals 3/13 and 60% of dinner 3/14. Per review, it appears he lost 10 lbs (4.7% body weight) from 2/6-3/5 which is not significant for time frame.  Patient is currently intubated on ventilator support MV: 12.7 L/min Temp (24hrs), Avg:97.7 F (36.5 C), Min:93.5 F (34.2 C), Max:99.6 F (37.6 C) Propofol: none BP: 124/75 and MAP: 89  Medications reviewed; sliding scale Novolog, 400 mg Mag-ox BID, 2 g IV Mg sulfate x1 dose  yesterday, 40 mg IV Protonix BID, 20 mEq oral KCl x1 dose yesterday, 5 mg Deltasone per OGT/day. Labs reviewed; CBGs: 172-236 mg/dL today, Cl: 93 mmol/L, Ca: 8.2 mg/dL.  Drip: Amiodarone @ 30 mg/hr   Diet Order:  Diet NPO time specified  Skin:  Wound (see comment) (Abdominal incision from lap splenectomy 07/13/16)  Last BM:  3/14  Height:   Ht Readings from Last 1 Encounters:  07/14/16 5\' 11"  (1.803 m)    Weight:   Wt Readings from Last 1 Encounters:  07/14/16 204 lb 12.9 oz (92.9 kg)    Ideal Body Weight:  78.18 kg  BMI:  Body mass index is 28.56 kg/m.  Estimated Nutritional Needs:   Kcal:  2073  Protein:  109-139 grams (1.2-1.5 grams/kg)  Fluid:  2 L/day  EDUCATION NEEDS:   No education needs identified at this time    Jarome Matin, MS, RD, LDN, CNSC Inpatient Clinical Dietitian Pager # [phone] After hours/weekend pager # [phone]

## 2016-07-14 NOTE — Procedures (Signed)
Chest Tube Insertion Procedure Note  Indications:  Clinically significant Effusion  Pre-operative Diagnosis: Empyema  Post-operative Diagnosis: Empyema  Procedure Details  Informed consent was obtained for the procedure, including sedation.  Risks of lung perforation, hemorrhage, arrhythmia, and adverse drug reaction were discussed.   After sterile skin prep, using standard technique, a 32 chest tube was placed in the left lateral rib space.  Findings: 1600 ml of serosanguinous fluid obtained  Estimated Blood Loss:  Minimal         Specimens:  None              Complications:  None; patient tolerated the procedure well.         Disposition: ICU - intubated and critically ill.         Condition: stable  Attending Attestation: I performed the procedure.  Marshell Garfinkel MD [facility] Pager [phone] If no answer or after 3pm call: [phone] 07/14/2016, 1:19 PM

## 2016-07-14 NOTE — Progress Notes (Signed)
HEMATOLOGY-ONCOLOGY PROGRESS NOTE  SUBJECTIVE: Status post splenectomy 07/13/2016 in bilateral chest tube placements 07/14/2016 Respiratory failure on ventilator on sedation No further nosebleeds. Patient was apparently combative and pulled out the endotracheal tube but could not tolerate the BiPAP and had to be intubated.  OBJECTIVE: REVIEW OF SYSTEMS:   Not obtainable as the patient is on the ventilator  PHYSICAL EXAMINATION:  Vitals:   07/14/16 1500 07/14/16 1600  BP: (!) 72/50   Pulse: 82   Resp: (!) 26   Temp:  99 F (37.2 C)   Filed Weights   07/12/16 0418 07/13/16 0500 07/14/16 0455  Weight: 204 lb 9.4 oz (92.8 kg) 197 lb 15.6 oz (89.8 kg) 204 lb 12.9 oz (92.9 kg)    EYES: normal, Conjunctiva are pink and non-injected, sclera clear OROPHARYNX:ET tube NECK: supple, thyroid normal size, non-tender, without nodularity LUNGS: clear to auscultation  HEART: regular rate & rhythm and no murmurs ABDOMEN: Recent surgery NEURO: Not examined  LABORATORY DATA:  I have reviewed the data as listed CMP Latest Ref Rng & Units 07/14/2016 07/13/2016 07/13/2016  Glucose 65 - 99 mg/dL 212(H) - 225(H)  BUN 6 - 20 mg/dL 18 - 15  Creatinine 0.61 - 1.24 mg/dL 0.91 - 0.75  Sodium 135 - 145 mmol/L 138 135 138  Potassium 3.5 - 5.1 mmol/L 4.2 4.1 4.0  Chloride 101 - 111 mmol/L 93(L) - 86(L)  CO2 22 - 32 mmol/L 36(H) - 43(H)  Calcium 8.9 - 10.3 mg/dL 8.2(L) - 8.7(L)  Total Protein 6.5 - 8.1 g/dL 6.2(L) - 6.7  Total Bilirubin 0.3 - 1.2 mg/dL 0.7 - 0.8  Alkaline Phos 38 - 126 U/L 71 - 78  AST 15 - 41 U/L 28 - 18  ALT 17 - 63 U/L 13(L) - 15(L)    Lab Results  Component Value Date   WBC 15.7 (H) 07/14/2016   HGB 11.4 (L) 07/14/2016   HCT 36.7 (L) 07/14/2016   MCV 87.8 07/14/2016   PLT 80 (L) 07/14/2016   NEUTROABS 14.8 (H) 07/14/2016    ASSESSMENT AND PLAN: 1. Severe refractory ITP require splenectomy Platelet count appears to be improving up to 80 today. 2. bilateral lower  effusion status post chest tube placement: Pleural fluid cytology sent 3. Respiratory failure on the vent and sedated Family and bedside. We will follow the patient closely.

## 2016-07-14 NOTE — Progress Notes (Signed)
Around 0315, pt o2 sat started to desat down to 83-87%, pt on placed from HFNC to nonrebreather. [facility] called, will try BiPAP and monitor for respiratory distress. Now with o2 sat of 96% on BiPAP.

## 2016-07-14 NOTE — Progress Notes (Addendum)
Progress Note  Patient Name: [name] Date of Encounter: 07/14/2016  Primary Cardiologist: Dr Cyril Mourning  Subjective   Pt self extubated during the night-on nasal O2 now Discussed with CCM possible  Need to be re intubated   Inpatient Medications    Scheduled Meds: . sodium chloride   Intravenous Once  . sodium chloride   Intravenous Once  . sodium chloride   Intravenous Once  . aminocaproic acid (AMICAR) IVPB  2 g/hr Intravenous Q6H  . amiodarone  150 mg Intravenous Once  . atorvastatin  40 mg Per Tube q1800  . budesonide (PULMICORT) nebulizer solution  0.25 mg Nebulization BID  . carvedilol  3.125 mg Per Tube BID WC  . chlorhexidine gluconate (MEDLINE KIT)  15 mL Mouth Rinse BID  . insulin aspart  0-5 Units Subcutaneous Q4H  . ipratropium  0.5 mg Nebulization Q6H  . levalbuterol  1.25 mg Nebulization Q6H  . magnesium oxide  400 mg Per Tube BID  . mouth rinse  15 mL Mouth Rinse QID  . pantoprazole (PROTONIX) IV  40 mg Intravenous Q12H  . potassium chloride  20 mEq Oral Daily  . predniSONE  5 mg Per Tube Q breakfast   Continuous Infusions: . amiodarone 30 mg/hr (07/13/16 2347)  . diltiazem (CARDIZEM) infusion 5 mg/hr (07/14/16 0225)   PRN Meds: acetaminophen, ALPRAZolam, chlorpheniramine-HYDROcodone, fentaNYL (SUBLIMAZE) injection, fentaNYL (SUBLIMAZE) injection, levalbuterol, ondansetron (ZOFRAN) IV, oxymetazoline   Vital Signs    Vitals:   07/14/16 0409 07/14/16 0417 07/14/16 0455 07/14/16 0800  BP:      Pulse:      Resp:      Temp: (!) 93.5 F (34.2 C) 97.5 F (36.4 C)  97.5 F (36.4 C)  TempSrc: Axillary Rectal  Oral  SpO2:      Weight:   204 lb 12.9 oz (92.9 kg)   Height:        Intake/Output Summary (Last 24 hours) at 07/14/16 0900 Last data filed at 07/14/16 0603  Gross per 24 hour  Intake          5306.51 ml  Output             1250 ml  Net          4056.51 ml   Filed Weights   07/12/16 0418 07/13/16 0500 07/14/16 0455  Weight:  204 lb 9.4 oz (92.8 kg) 197 lb 15.6 oz (89.8 kg) 204 lb 12.9 oz (92.9 kg)    Telemetry     NSR - Personally Reviewed  ECG     03/11 AF with RVR vs PSVT- Personally Reviewed  Physical Exam   GEN: mild respiratory distress.  Pale, no active nose bleed Neck: No JVD seen but difficult to assess 2nd lines Cardiac: RRR no murmur Respiratory: decreased breath sounds, no wheezing, dullness to percussion 3/4 up bilaterally. Coarse rhonchi on Lt GI: Soft, nontender, non-distended  MS: 1+ LE edema; No deformity. Neuro:  Nonfocal  Psych: Normal affect   Labs    Chemistry  Recent Labs Lab 07/12/16 1416 07/13/16 0341 07/13/16 1227 07/14/16 0522  NA 137 138 135 138  K 3.5 4.0 4.1 4.2  CL 85* 86*  --  93*  CO2 44* 43*  --  36*  GLUCOSE 189* 225*  --  212*  BUN 18 15  --  18  CREATININE 0.78 0.75  --  0.91  CALCIUM 8.7* 8.7*  --  8.2*  PROT 7.0 6.7  --  6.2*  ALBUMIN  2.8* 2.6*  --  2.6*  AST 19 18  --  28  ALT 14* 15*  --  13*  ALKPHOS 75 78  --  71  BILITOT 0.5 0.8  --  0.7  GFRNONAA >60 >60  --  >60  GFRAA >60 >60  --  >60  ANIONGAP 8 9  --  9     Hematology  Recent Labs Lab 07/12/16 2320 07/13/16 0341 07/13/16 1227 07/14/16 0522  WBC 8.8 9.5  --  15.7*  RBC 3.49* 3.78*  --  4.18*  HGB 9.3* 10.2* 9.5* 11.4*  HCT 30.4* 33.4* 28.0* 36.7*  MCV 87.1 88.4  --  87.8  MCH 26.6 27.0  --  27.3  MCHC 30.6 30.5  --  31.1  RDW 14.9 15.1  --  16.6*  PLT 43* 49*  --  80*    Cardiac Enzymes  Recent Labs Lab 07/08/16 2058 07/09/16 0535 07/09/16 1722  TROPONINI <0.03 <0.03 <0.03   No results for input(s): TROPIPOC in the last 168 hours.   BNP  Recent Labs Lab 07/08/16 2308  BNP 113.8*     Radiology    PORTABLE CHEST 1 VIEW 07/14/16  COMPARISON:  07/13/2016.  FINDINGS: Interim extubation removal of NG tube. Heart size stable. Pulmonary venous congestion and bilateral pulmonary infiltrates with bilateral pleural effusions suggesting CHF. Bilateral  pneumonia cannot be excluded. These findings unchanged prior exam. No pneumothorax.  IMPRESSION: 1. Interim extubation removal of NG tube.  2. Pulmonary venous congestion and bilateral pulmonary infiltrates with bilateral pleural effusions again noted. Findings suggest congestive heart failure. Bilateral pneumonia cannot be excluded. No interim change from prior exam.    Cardiac Studies   Echo 07/09/16- Study Conclusions - Left ventricle: The cavity size was normal. Wall thickness was   increased in a pattern of mild LVH. Systolic function was normal.   The estimated ejection fraction was in the range of 60% to 65%.   Although no diagnostic regional wall motion abnormality was   identified, this possibility cannot be completely excluded on the   basis of this study. Features are consistent with a pseudonormal   left ventricular filling pattern, with concomitant abnormal   relaxation and increased filling pressure (grade 2 diastolic   dysfunction). - Mitral valve: Calcified annulus. Mildly calcified leaflets .   There was trivial regurgitation. - Right ventricle: The cavity size was mildly dilated. - Right atrium: Central venous pressure (est): 3 mm Hg. - Atrial septum: No defect or patent foramen ovale was identified. - Tricuspid valve: There was trivial regurgitation. - Pericardium, extracardiac: There was a right pleural effusion.  Impressions:  - Overall limited images. Mild LVH with LVEF 60-65%. Probable grade   2 diastolic dysfunction. Calcified mitral annulus with trivial   mitral regurgitation. Aortic valve is moderately calcified and   not well seen. Right ventricle appears mildly dilated. Trivial   tricuspid regurgitation. 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 Since admission his rate has been  difficult to control but has converted to NSR with IV Amiodarone. Not a candidate for anticoagulation with undetectable PTLs and active bleeding requiring transfusion of blood and platelets. He has recurrent bilateral pleural effusions and went for thoracentesis 3/14 in preparation for splenectomy 3/15. The thoracentesis was only partially successful as the pt developed hypotension and the procedure  had to be aborted. The did have his splenectomy 07/14/15 and is for repeat thoracentesis.   Assessment & Plan    1. AF with RVR vs PSVT- Holding NSR on IV Amiodarone and Diltiazem..   2. 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   3. Blood loss anemia- Hgb dropped from 11.2 to 7. Transfused 07/10/16  4. Pleural effusions- Recurrent pleural effusions this admission - thoracentesis 07/12/16- partially successful-1.2L removed from Lt but then pt became hypotensive. For repeat thoracentesis today  5. Diastolic dysfunction- EF 83-38% with LVH and grade 2 DD  Plan- He is at high risk for decompensation if he goes back into AF with RVR. Continue IV Diltiazem and Amiodarone for now- transition to po when able s/p splenectomy. At some point resume Lasix, it had been held secondary to hypotension. Pt for repeat thoracentesis today.      Signed, Kerin Ransom, PA-C  07/14/2016, 9:00 AM    Cardiac status is stable maintaining NSR on amiodarone. Using accessory muscles  CXR with large Right pleural effusion And moderate left Right side was not tapped before surgery. CCM to see to possibly reintubate or proceed with right thoracentesis.    Jenkins Rouge

## 2016-07-14 NOTE — Procedures (Signed)
Chest Tube Insertion Procedure Note  Indications:  Clinically significant Effusion  Pre-operative Diagnosis: Empyema  Post-operative Diagnosis: Empyema  Procedure Details  Informed consent was obtained for the procedure, including sedation.  Risks of lung perforation, hemorrhage, arrhythmia, and adverse drug reaction were discussed.   After sterile skin prep, using standard technique, a 32 chest ttube was placed in the right lateral  rib space.  Findings: 900 ml of serosanguinous fluid obtained  Estimated Blood Loss:  Minimal         Specimens:  None              Complications:  None; patient tolerated the procedure well.         Disposition: ICU - intubated and critically ill.         Condition: stable  Attending Attestation: I performed the procedure.  Marshell Garfinkel MD [facility] Pager [phone] If no answer or after 3pm call: [phone] 07/14/2016, 1:20 PM

## 2016-07-15 ENCOUNTER — Inpatient Hospital Stay (HOSPITAL_COMMUNITY): Payer: Medicare HMO

## 2016-07-15 DIAGNOSIS — A419 Sepsis, unspecified organism: Secondary | ICD-10-CM

## 2016-07-15 DIAGNOSIS — R6521 Severe sepsis with septic shock: Secondary | ICD-10-CM

## 2016-07-15 DIAGNOSIS — R579 Shock, unspecified: Secondary | ICD-10-CM | POA: Insufficient documentation

## 2016-07-15 DIAGNOSIS — I509 Heart failure, unspecified: Secondary | ICD-10-CM

## 2016-07-15 LAB — CBC WITH DIFFERENTIAL/PLATELET
Basophils Absolute: 0 10*3/uL (ref 0.0–0.1)
Basophils Relative: 0 %
EOS ABS: 0 10*3/uL (ref 0.0–0.7)
EOS PCT: 0 %
HCT: 32.9 % — ABNORMAL LOW (ref 39.0–52.0)
Hemoglobin: 10.5 g/dL — ABNORMAL LOW (ref 13.0–17.0)
LYMPHS ABS: 0.6 10*3/uL — AB (ref 0.7–4.0)
Lymphocytes Relative: 5 %
MCH: 27.5 pg (ref 26.0–34.0)
MCHC: 31.9 g/dL (ref 30.0–36.0)
MCV: 86.1 fL (ref 78.0–100.0)
MONOS PCT: 3 %
Monocytes Absolute: 0.4 10*3/uL (ref 0.1–1.0)
Neutro Abs: 11.6 10*3/uL — ABNORMAL HIGH (ref 1.7–7.7)
Neutrophils Relative %: 92 %
PLATELETS: 53 10*3/uL — AB (ref 150–400)
RBC: 3.82 MIL/uL — ABNORMAL LOW (ref 4.22–5.81)
RDW: 16.4 % — AB (ref 11.5–15.5)
WBC: 12.5 10*3/uL — AB (ref 4.0–10.5)

## 2016-07-15 LAB — BASIC METABOLIC PANEL
Anion gap: 9 (ref 5–15)
BUN: 25 mg/dL — AB (ref 6–20)
CHLORIDE: 94 mmol/L — AB (ref 101–111)
CO2: 35 mmol/L — AB (ref 22–32)
CREATININE: 1.04 mg/dL (ref 0.61–1.24)
Calcium: 8.1 mg/dL — ABNORMAL LOW (ref 8.9–10.3)
GFR calc Af Amer: >60 mL/min (ref >60)
GFR calc non Af Amer: >60 mL/min (ref >60)
Glucose, Bld: 250 mg/dL — ABNORMAL HIGH (ref 65–99)
POTASSIUM: 3.3 mmol/L — AB (ref 3.5–5.1)
SODIUM: 138 mmol/L (ref 135–145)

## 2016-07-15 LAB — URINALYSIS, ROUTINE W REFLEX MICROSCOPIC
BILIRUBIN URINE: NEGATIVE
Glucose, UA: >=500 mg/dL — AB
KETONES UR: 20 mg/dL — AB
LEUKOCYTES UA: NEGATIVE
NITRITE: NEGATIVE
Protein, ur: 100 mg/dL — AB
SPECIFIC GRAVITY, URINE: 1.025 (ref 1.005–1.030)
SQUAMOUS EPITHELIAL / LPF: NONE SEEN
pH: 5 (ref 5.0–8.0)

## 2016-07-15 LAB — GLUCOSE, CAPILLARY
GLUCOSE-CAPILLARY: 192 mg/dL — AB (ref 65–99)
GLUCOSE-CAPILLARY: 269 mg/dL — AB (ref 65–99)
GLUCOSE-CAPILLARY: 382 mg/dL — AB (ref 65–99)
Glucose-Capillary: 272 mg/dL — ABNORMAL HIGH (ref 65–99)
Glucose-Capillary: 187 mg/dL — ABNORMAL HIGH (ref 65–99)
Glucose-Capillary: 244 mg/dL — ABNORMAL HIGH (ref 65–99)

## 2016-07-15 LAB — BRAIN NATRIURETIC PEPTIDE: B Natriuretic Peptide: 95.6 pg/mL (ref 0.0–100.0)

## 2016-07-15 LAB — MAGNESIUM: Magnesium: 1.5 mg/dL — ABNORMAL LOW (ref 1.7–2.4)

## 2016-07-15 LAB — RHEUMATOID FACTORS, FLUID: Rheumatoid Arthritis, Qn/Fluid: 1:40 {titer} — ABNORMAL HIGH

## 2016-07-15 MED ORDER — POTASSIUM CHLORIDE CRYS ER 20 MEQ PO TBCR: 40.0000 meq | EXTENDED_RELEASE_TABLET | Freq: Once | ORAL | Status: DC

## 2016-07-15 MED ORDER — SODIUM CHLORIDE 0.9% FLUSH
10.0000 mL | Freq: Two times a day (BID) | INTRAVENOUS | Status: DC
  Administered 2016-07-15 – 2016-07-18 (×5): 10 mL
  Administered 2016-07-18 – 2016-07-19 (×2): 40 mL
  Administered 2016-07-20 – 2016-07-24 (×6): 10 mL
  Administered 2016-07-25: 20 mL
  Administered 2016-07-26: 10 mL

## 2016-07-15 MED ORDER — CHLORHEXIDINE GLUCONATE CLOTH 2 % EX PADS
6.0000 | MEDICATED_PAD | Freq: Every day | CUTANEOUS | Status: DC
  Administered 2016-07-18 – 2016-07-23 (×8): 6 via TOPICAL

## 2016-07-15 MED ORDER — SODIUM CHLORIDE 0.9% FLUSH
10.0000 mL | INTRAVENOUS | Status: DC | PRN
  Administered 2016-07-25 – 2016-08-02 (×7): 10 mL
  Filled 2016-07-15 (×7): qty 40

## 2016-07-15 MED ORDER — FUROSEMIDE 10 MG/ML IJ SOLN
40.0000 mg | Freq: Once | INTRAMUSCULAR | Status: AC
  Administered 2016-07-15: 40 mg via INTRAVENOUS
  Filled 2016-07-15: qty 4

## 2016-07-15 MED ORDER — POTASSIUM CHLORIDE CRYS ER 20 MEQ PO TBCR
20.0000 meq | EXTENDED_RELEASE_TABLET | Freq: Once | ORAL | Status: AC
  Administered 2016-07-15: 20 meq via ORAL
  Filled 2016-07-15: qty 1

## 2016-07-15 NOTE — Progress Notes (Signed)
Dr. Nelda Marseille in earlier, did not speak with nurse this am.  Dr. Nelda Marseille ordered CVP, but patient does not have a central line at this time.  Unable to do a CVP through a peripheal IV.  The patient has three peripheal IV's today.  Notified Dr. Nelda Marseille that unable to get a CVP, and he ordered a PICC line when the IV RN's are available.  Juwan Vences Roselie Awkward RN

## 2016-07-15 NOTE — Progress Notes (Signed)
Peripherally Inserted Central Catheter/Midline Placement  The IV Nurse has discussed with the patient and/or persons authorized to consent for the patient, the purpose of this procedure and the potential benefits and risks involved with this procedure.  The benefits include less needle sticks, lab draws from the catheter, and the patient may be discharged home with the catheter. Risks include, but not limited to, infection, bleeding, blood clot (thrombus formation), and puncture of an artery; nerve damage and irregular heartbeat and possibility to perform a PICC exchange if needed/ordered by physician.  Alternatives to this procedure were also discussed.  B[employer] PICC patient education guide, fact sheet on infection prevention and patient information card has been provided to patient /or left at bedside.    PICC/Midline Placement Documentation  PICC Double Lumen 07/15/16 PICC Right Brachial 41 cm 0 cm (Active)  Indication for Insertion or Continuance of Line Vasoactive infusions;Prolonged intravenous therapies 07/15/2016  7:00 PM  Exposed Catheter (cm) 0 cm 07/15/2016  7:00 PM  Site Assessment Clean;Dry;Intact 07/15/2016  7:00 PM  Lumen #1 Status Flushed;Saline locked;Blood return noted 07/15/2016  7:00 PM  Lumen #2 Status Flushed;Saline locked;Blood return noted 07/15/2016  7:00 PM  Dressing Type Transparent 07/15/2016  7:00 PM  Dressing Status Clean;Dry;Intact;Antimicrobial disc in place 07/15/2016  7:00 PM  Line Care Connections checked and tightened 07/15/2016  7:00 PM  Line Adjustment (NICU/IV Team Only) No 07/15/2016  7:00 PM  Dressing Intervention New dressing 07/15/2016  7:00 PM  Dressing Change Due 07/22/16 07/15/2016  7:00 PM       Rolena Infante 07/15/2016, 7:11 PM

## 2016-07-15 NOTE — Progress Notes (Signed)
HEMATOLOGY-ONCOLOGY PROGRESS NOTE  SUBJECTIVE: Patient rested comfortably through the night requiring when necessary sedation. Patient's daughter was at his bedside all night.  OBJECTIVE: REVIEW OF SYSTEMS:   Not obtainable. patient is on ventilator  PHYSICAL EXAMINATION:  Vitals:   07/15/16 0700 07/15/16 0800  BP: (!) 145/53 (!) 130/47  Pulse: 94 87  Resp: (!) 24 (!) 22  Temp:     Filed Weights   07/13/16 0500 07/14/16 0455 07/15/16 0410  Weight: 197 lb 15.6 oz (89.8 kg) 204 lb 12.9 oz (92.9 kg) 201 lb 4.5 oz (91.3 kg)    GENERAL:On the ventilator sedated EYES: normal, Conjunctiva are pink and non-injected, sclera clear OROPHARYNX:no exudate, no erythema and lips, buccal mucosa, and tongue normal  LUNGS: Coarse breath sounds bilaterally HEART: Tachycardia intermittently, 2-3+ lower extremity edema ABDOMEN: Recent splenectomy Musculoskeletal:no cyanosis of digits and no clubbing  NEURO: Sedated on vent  LABORATORY DATA:  I have reviewed the data as listed CMP Latest Ref Rng & Units 07/15/2016 07/14/2016 07/13/2016  Glucose 65 - 99 mg/dL 250(H) 212(H) -  BUN 6 - 20 mg/dL 25(H) 18 -  Creatinine 0.61 - 1.24 mg/dL 1.04 0.91 -  Sodium 135 - 145 mmol/L 138 138 135  Potassium 3.5 - 5.1 mmol/L 3.3(L) 4.2 4.1  Chloride 101 - 111 mmol/L 94(L) 93(L) -  CO2 22 - 32 mmol/L 35(H) 36(H) -  Calcium 8.9 - 10.3 mg/dL 8.1(L) 8.2(L) -  Total Protein 6.5 - 8.1 g/dL - 6.2(L) -  Total Bilirubin 0.3 - 1.2 mg/dL - 0.7 -  Alkaline Phos 38 - 126 U/L - 71 -  AST 15 - 41 U/L - 28 -  ALT 17 - 63 U/L - 13(L) -    Lab Results  Component Value Date   WBC 15.7 (H) 07/14/2016   HGB 11.4 (L) 07/14/2016   HCT 36.7 (L) 07/14/2016   MCV 87.8 07/14/2016   PLT 80 (L) 07/14/2016   NEUTROABS 14.8 (H) 07/14/2016    ASSESSMENT AND PLAN: 1. Acute ITP: Status post penectomy. CBC today was not drawn. I ordered a CBC to be done. Yesterday the platelets were up to 80,000. There is no further need for  Amicar. 2. bilateral pleural effusions: Cytology is pending. Previous cytology was negative. Bilateral chest tubes in place. Pulmonary is managing. 3. Tachycardia: Cardiology has been following him for heart failure.

## 2016-07-15 NOTE — Progress Notes (Addendum)
Progress Note  Patient Name: [name] Date of Encounter: 07/15/2016  Primary Cardiologist: Dr Cyril Mourning  Subjective   Patient had bilatearl CT placed yesterday with 1600cc of pleural fluid drained from left pleural space and 900cc from right pleural space.  Pulled out endotracheal tube and did not tolerate BiPAP and now reintubated.    Inpatient Medications    Scheduled Meds: . sodium chloride   Intravenous Once  . sodium chloride   Intravenous Once  . sodium chloride   Intravenous Once  . amiodarone  150 mg Intravenous Once  . atorvastatin  40 mg Per Tube q1800  . budesonide (PULMICORT) nebulizer solution  0.25 mg Nebulization BID  . carvedilol  3.125 mg Per Tube BID WC  . chlorhexidine gluconate (MEDLINE KIT)  15 mL Mouth Rinse BID  . insulin aspart  0-5 Units Subcutaneous Q4H  . ipratropium  0.5 mg Nebulization Q6H  . levalbuterol  1.25 mg Nebulization Q6H  . magnesium oxide  400 mg Per Tube BID  . mouth rinse  15 mL Mouth Rinse QID  . pantoprazole (PROTONIX) IV  40 mg Intravenous Q12H  . piperacillin-tazobactam (ZOSYN)  IV  3.375 g Intravenous Q8H  . potassium chloride  20 mEq Oral Daily  . predniSONE  5 mg Per Tube Q breakfast  . vancomycin  1,250 mg Intravenous Q12H   Continuous Infusions: . amiodarone 30 mg/hr (07/15/16 0019)  . diltiazem (CARDIZEM) infusion 7 mg/hr (07/15/16 0030)  . norepinephrine (LEVOPHED) Adult infusion 16 mcg/min (07/15/16 0800)   PRN Meds: acetaminophen, ALPRAZolam, chlorpheniramine-HYDROcodone, fentaNYL (SUBLIMAZE) injection, fentaNYL (SUBLIMAZE) injection, levalbuterol, ondansetron (ZOFRAN) IV, oxymetazoline   Vital Signs    Vitals:   07/15/16 0500 07/15/16 0600 07/15/16 0700 07/15/16 0800  BP: (!) 126/57 (!) 130/45 (!) 145/53 (!) 130/47  Pulse: 98 91 94 87  Resp: (!) 24 (!) 22 (!) 24 (!) 22  Temp:      TempSrc:      SpO2: 98% 99% 98% 99%  Weight:      Height:        Intake/Output Summary (Last 24 hours) at  07/15/16 0857 Last data filed at 07/15/16 0800  Gross per 24 hour  Intake          2450.43 ml  Output             3075 ml  Net          -624.57 ml   Filed Weights   07/13/16 0500 07/14/16 0455 07/15/16 0410  Weight: 197 lb 15.6 oz (89.8 kg) 204 lb 12.9 oz (92.9 kg) 201 lb 4.5 oz (91.3 kg)    Telemetry     NSR - Personally Reviewed  ECG     03/10 AF with RVR vs PSVT.  03/11 NSR- Personally Reviewed  Physical Exam   GEN: intubated and sedated Neck: No JVD seen but difficult to assess 2nd lines Cardiac: RRR no murmur/R or gallops.  Marked anasarca with 2+ pitting edema in hands and legs Respiratory: decreased breath sounds with faint rhonchi and mild wheezing anteriorly GI: Soft, nontender, non-distended  MS: 2+ LE edema and in hands; No deformity. Neuro:  cannot assess as patient is sedated  Psych: cannot assess as he is sedated  Labs    Chemistry  Recent Labs Lab 07/12/16 1416 07/13/16 0341 07/13/16 1227 07/14/16 0522 07/15/16 0500  NA 137 138 135 138 138  K 3.5 4.0 4.1 4.2 3.3*  CL 85* 86*  --  93* 94*  CO2 44* 43*  --  36* 35*  GLUCOSE 189* 225*  --  212* 250*  BUN 18 15  --  18 25*  CREATININE 0.78 0.75  --  0.91 1.04  CALCIUM 8.7* 8.7*  --  8.2* 8.1*  PROT 7.0 6.7  --  6.2*  --   ALBUMIN 2.8* 2.6*  --  2.6*  --   AST 19 18  --  28  --   ALT 14* 15*  --  13*  --   ALKPHOS 75 78  --  71  --   BILITOT 0.5 0.8  --  0.7  --   GFRNONAA >60 >60  --  >60 >60  GFRAA >60 >60  --  >60 >60  ANIONGAP 8 9  --  9 9     Hematology  Recent Labs Lab 07/12/16 2320 07/13/16 0341 07/13/16 1227 07/14/16 0522  WBC 8.8 9.5  --  15.7*  RBC 3.49* 3.78*  --  4.18*  HGB 9.3* 10.2* 9.5* 11.4*  HCT 30.4* 33.4* 28.0* 36.7*  MCV 87.1 88.4  --  87.8  MCH 26.6 27.0  --  27.3  MCHC 30.6 30.5  --  31.1  RDW 14.9 15.1  --  16.6*  PLT 43* 49*  --  80*    Cardiac Enzymes  Recent Labs Lab 07/08/16 2058 07/09/16 0535 07/09/16 1722  TROPONINI <0.03 <0.03 <0.03   No  results for input(s): TROPIPOC in the last 168 hours.   BNP  Recent Labs Lab 07/08/16 2308  BNP 113.8*     Radiology    PORTABLE CHEST 1 VIEW 07/14/16  COMPARISON:  07/13/2016.  FINDINGS: Interim extubation removal of NG tube. Heart size stable. Pulmonary venous congestion and bilateral pulmonary infiltrates with bilateral pleural effusions suggesting CHF. Bilateral pneumonia cannot be excluded. These findings unchanged prior exam. No pneumothorax.  IMPRESSION: 1. Interim extubation removal of NG tube.  2. Pulmonary venous congestion and bilateral pulmonary infiltrates with bilateral pleural effusions again noted. Findings suggest congestive heart failure. Bilateral pneumonia cannot be excluded. No interim change from prior exam.    Cardiac Studies   Echo 07/09/16- Study Conclusions - Left ventricle: The cavity size was normal. Wall thickness was   increased in a pattern of mild LVH. Systolic function was normal.   The estimated ejection fraction was in the range of 60% to 65%.   Although no diagnostic regional wall motion abnormality was   identified, this possibility cannot be completely excluded on the   basis of this study. Features are consistent with a pseudonormal   left ventricular filling pattern, with concomitant abnormal   relaxation and increased filling pressure (grade 2 diastolic   dysfunction). - Mitral valve: Calcified annulus. Mildly calcified leaflets .   There was trivial regurgitation. - Right ventricle: The cavity size was mildly dilated. - Right atrium: Central venous pressure (est): 3 mm Hg. - Atrial septum: No defect or patent foramen ovale was identified. - Tricuspid valve: There was trivial regurgitation. - Pericardium, extracardiac: There was a right pleural effusion.  Impressions:  - Overall limited images. Mild LVH with LVEF 60-65%. Probable grade   2 diastolic dysfunction. Calcified mitral annulus with trivial   mitral  regurgitation. Aortic valve is moderately calcified and   not well seen. Right ventricle appears mildly dilated. Trivial   tricuspid regurgitation. 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 Since admission his rate has been difficult to control but has converted to NSR with IV Amiodarone. Not a candidate for anticoagulation with undetectable PTLs and active bleeding requiring transfusion of blood and platelets. He has recurrent bilateral pleural effusions and went for thoracentesis 3/14 in preparation for splenectomy 3/15. The thoracentesis was only partially successful as the pt developed hypotension and the procedure had to be aborted. The did have his splenectomy 07/14/15 and is for repeat thoracentesis.   Assessment & Plan    1. AF with RVR vs PSVT- he has been maintaining NSR with no further afib or SVT. Continue  IV Amiodarone and Diltiazem while intubated. Continue coreg.   2. 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   3. Blood loss anemia- Hgb dropped from 11.2 to 7. Transfused 07/10/16 and now 11.4 yesterday.   4. Pleural effusions- Recurrent pleural effusions this admission - thoracentesis 07/12/16- partially successful-1.2L removed from Lt but then pt became hypotensive.  - repeat thoracentesis yesterday with 1600cc of serosanguinous fluid on left and 900cc on right s/p bilateral chest tubes.  5. 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  His albumin yesterday was 2.6.   - he put out 3L yesterday but still 5L up.  No significant change in weight since admission but I suspect that he has lost body mass since his surgery and would expect weight to have dropped some so likely  he has maintained his weight with the volume overload. Cxray from yesterday showed CHF.  He has been off Lasix since his surgery.  I will check a BNP.  Will give lasix '40mg'$  IV today.  He is currently on Levophed for pressor support but is also on IV Cardizem gtt.  Will stop Cardizem gtt since he has been maintaining NSR and is on IV Amio for afib suppression.  Will start to wean Levophed.    6.  Hypokalemia - replete and keep > 4 - check Mag level  I have spent a total of 40 minutes with patient reviewing prior notes in Epic, chest xray, op notes, procedure notes , telemetry, EKGs, labs and examining patient as well as establishing an assessment and plan that was discussed with the patient.  > 50% of time was spent in direct patient care.    Signed, Fransico Him, MD  07/15/2016, 8:57 AM

## 2016-07-15 NOTE — Progress Notes (Signed)
PULMONARY / CRITICAL CARE MEDICINE   Name: [name] MRN: [id number] DOB: June 25, 1940    ADMISSION DATE:  07/08/2016 CONSULTATION DATE: 3/15  REFERRING MD:  Barry Dienes   CHIEF COMPLAINT:  Vent management    SUBJECTIVE:  Intubated overnight due to hypercarbia and AMS  VITAL SIGNS: BP (!) 114/53   Pulse 89   Temp 98 F (36.7 C)   Resp (!) 22   Ht 5\' 11"  (1.803 m)   Wt 91.3 kg (201 lb 4.5 oz)   SpO2 99%   BMI 28.07 kg/m  100% NRB HEMODYNAMICS:    VENTILATOR SETTINGS: Vent Mode: PRVC FiO2 (%):  [50 %-60 %] 50 % Set Rate:  [22 bmp] 22 bmp Vt Set:  [600 mL] 600 mL PEEP:  [5 cmH20] 5 cmH20 Plateau Pressure:  [21 cmH20-22 cmH20] 21 cmH20  INTAKE / OUTPUT: I/O last 3 completed shifts: In: 3311.7 [I.V.:2122.7; Blood:389; IV [id number]:782] Out: 9562 [Urine:1420; Chest Tube:2120]  PHYSICAL EXAMINATION: General appearance:  Chronically ill appearing male that is acutely ill now Mouth:  MMM Neck: Trachea midline, neck supple, -JVD Lungs/chest: Diffuse crackles, CT bilaterally noted CV: IRIR, Nl S1/S2, -M/R/G. Abdomen: Soft, non-tender; no masses or HSM Extremities: + edema Skin: Normal temperature, turgor and texture; no rash, ulcers or subcutaneous nodules Neuro/Psych: sedated, but opens eyes and now trying to talk more. Oriented x2. Strength equal bilateral  LABS:  BMET  Recent Labs Lab 07/13/16 0341 07/13/16 1227 07/14/16 0522 07/15/16 0500  NA 138 135 138 138  K 4.0 4.1 4.2 3.3*  CL 86*  --  93* 94*  CO2 43*  --  36* 35*  BUN 15  --  18 25*  CREATININE 0.75  --  0.91 1.04  GLUCOSE 225*  --  212* 250*    Electrolytes  Recent Labs Lab 07/12/16 1416 07/13/16 0341 07/14/16 0522 07/15/16 0500 07/15/16 0530  CALCIUM 8.7* 8.7* 8.2* 8.1*  --   MG 1.4* 1.5* 1.6*  --  1.5*  PHOS 3.0 3.6 4.5  --   --    CBC  Recent Labs Lab 07/13/16 0341 07/13/16 1227 07/14/16 0522 07/15/16 0930  WBC 9.5  --  15.7* 12.5*  HGB 10.2* 9.5* 11.4* 10.5*  HCT 33.4*  28.0* 36.7* 32.9*  PLT 49*  --  80* 53*   Coag's  Recent Labs Lab 07/09/16 0536  INR 1.16   Sepsis Markers No results for input(s): LATICACIDVEN, PROCALCITON, O2SATVEN in the last 168 hours.  ABG  Recent Labs Lab 07/13/16 1552 07/14/16 1235 07/14/16 1527  PHART 7.370 7.194* 7.471*  PCO2ART 65.1* 107* 52.1*  PO2ART 172* 113* 95.0   Liver Enzymes  Recent Labs Lab 07/12/16 1416 07/13/16 0341 07/14/16 0522  AST 19 18 28   ALT 14* 15* 13*  ALKPHOS 75 78 71  BILITOT 0.5 0.8 0.7  ALBUMIN 2.8* 2.6* 2.6*   Cardiac Enzymes  Recent Labs Lab 07/08/16 2058 07/09/16 0535 07/09/16 1722  TROPONINI <0.03 <0.03 <0.03   Glucose  Recent Labs Lab 07/14/16 1203 07/14/16 1652 07/14/16 2027 07/15/16 0022 07/15/16 0346 07/15/16 0755  GLUCAP 236* 217* 204* 192* 269* 272*   Imaging Dg Chest Port 1 View  Result Date: 07/15/2016 CLINICAL DATA:  Acute respiratory failure. EXAM: PORTABLE CHEST 1 VIEW COMPARISON:  07/14/2016 FINDINGS: Endotracheal tube with tip 4.3 cm above the carina. Visualize nasogastric tube unchanged. Bibasilar chest tubes unchanged. Lungs are adequately inflated with continued improvement in small loculated bibasilar pneumothoraces. Persistent bibasilar opacification likely atelectasis with possible small amount  of bilateral pleural fluid. Slight worsening hazy opacification over the right midlung which may be due to atelectasis or infection. Cardiomediastinal silhouette and remainder of the exam is unchanged. IMPRESSION: Slight worsening hazy opacification over the right midlung which may be due to atelectasis or infection. Persistent bibasilar opacification likely atelectasis with possible small bilateral pleural effusions. Bibasilar chest tubes in place with continued interval improvement of small loculated pneumothoraces. Remaining tubes and lines as described. Electronically Signed   By: Marin Olp M.D.   On: 07/15/2016 08:55   Dg Chest Port 1  View  Result Date: 07/14/2016 CLINICAL DATA:  Status post intubation. EXAM: PORTABLE CHEST 1 VIEW COMPARISON:  07/14/2016 at 12:10 p.m. FINDINGS: The endotracheal tube tip projects 3.6 cm above the carina, well positioned. New nasal/ orogastric tube passes below the diaphragm and below the included field of view, into the stomach. Bilateral lower hemithorax chest tubes are stable. The the home pneumothoraces noted in the lower lungs on the prior study have mildly decreased in size. No other change. IMPRESSION: 1. Well-positioned endotracheal tube. Nasal/orogastric tube passes below the diaphragm into the stomach, but below the included field of view. 2. Mild decrease in the size of the bilateral lower lung zone pneumothoraces. Electronically Signed   By: Lajean Manes M.D.   On: 07/14/2016 13:53   Dg Chest Port 1 View  Result Date: 07/14/2016 CLINICAL DATA:  Status post placement of bilateral chest tubes. EXAM: PORTABLE CHEST 1 VIEW COMPARISON:  07/14/2016 at 4:45 a.m. FINDINGS: Sincerely exam, bilateral lower hemithorax chest tubes have been placed. On the right, a chest tube tip projects inferior to the right hilar structures. On the left, the chest tube tip projects over the mid to lower left mid hemithorax. There has been a significant decrease in pleural fluid. Loculated areas noted along the peripheral right mid to lower lung, bb bordering consolidated, partly collapsed lung. On the left, there is a loculated pneumothorax at the lateral left mid to lower hemithorax. Hazy opacities noted in the partly collapsed lower left lung. There is no apical pneumothorax on either side. IMPRESSION: 1. Status post chest tube placement in both lower hemithoraces with significant decrease in pleural fluid. 2. There are now pneumothoraces in the mid to lower hemithoraces. On the right, this appears loculated. This is more simple in appearance on the left. The partly collapsed lower lungs are densely consolidated on the  right with more hazy consolidation on the left, which may be atelectasis, edema or infection, or a combination. Electronically Signed   By: Lajean Manes M.D.   On: 07/14/2016 12:39     STUDIES/events Splenectomy 3/15.  3/15 self extubaetd Right thora 3/16.Marland Kitchen 800 clearly exudate. Appears like old blood Pleural cytology 3/16>>> 3/16 bilateral chest tubes placed d/t old bloody appearing effusions w/ significant fibrinous debris noted on [place].     CULTURES: Pleural fluid 3/16>>>  ANTIBIOTICS:   SIGNIFICANT EVENTS: Left thora 1.2 blood tinged fluid   LINES/TUBES: OETT 3/15>>>3/16 (self extubated) ETT 3/16>>> Bilateral Chest tubes 3/16  ASSESSMENT / PLAN:  Acute on chronic respiratory failure in setting of large bilateral Exudative Pleural effusions, superimposed un underlying COPD and BTX.  -self extubated 3/16 -the etiology if these effusions are unclear. They are exudate for certain. On last tap: mixed lymphoid and reactive mesos.  -placed Bilateral Chest tubes 3/16. Effusions appear like old blood. Wonder if he had bleed at some point. Lots of fibrinous changes on [place] eval  Plan Both CT to suction Quantify  output from CT daily ABG and adjust vent accordingly Titrate O2 for sats 88-92% May need repeat CT imaging of chest to further eval pleural space. If we think that this is old blood and chest not fully evacuated may need to consider TPA/DNase (not surgical candidate), will repeat chest CT on Monday. 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  Thrombocytopenia -->improved (got platelets 3/15) Blood loss anemia  -no active bleeding currently Plan Repeat CBC Transfuse PRN SCDs Further recs per heme  Post-op care per surg Cont pred via tube  PAF. Currently w/ CVR Cardiogenic vs septic shock Plan Cont amio and cardiazem per cards F/U CVP Levophed for BP support  Diastolic HF Plan Maintain fluid even for now given hemodynamics  Pain, mild  acute encephalopathy.  -may be in part d/t rising CO2. Plan Limit sedation unless intubated Ck ABG Cont supportive care  Leukocytosis  -does have thick yellow nasal and oral secretions. Certainly aspiration risk w/ poor cough mechanics.  Plan Trend fever curve PCT algo Continue vanc/zosyn  DM w/ hyperglycemia  Plan ISS CBGs   DVT prophylaxis: SCD SUP: PPI Diet: NPO Activity: bedrest Disposition : ICU  Family updated bedside  The patient is critically ill with multiple organ systems failure and requires high complexity decision making for assessment and support, frequent evaluation and titration of therapies, application of advanced monitoring technologies and extensive interpretation of multiple databases.   Critical Care Time devoted to patient care services described in this note is  35  Minutes. This time reflects time of care of this signee Dr Jennet Maduro. This critical care time does not reflect procedure time, or teaching time or supervisory time of PA/NP/Med student/Med Resident etc but could involve care discussion time.  Rush Farmer, M.D. [facility] Pulmonary/Critical Care Medicine. Pager: [phone]. After hours pager: [phone].   07/15/2016, 10:27 AM

## 2016-07-15 NOTE — Progress Notes (Signed)
2 Days Post-Op  Subjective: On vent Hemodynamically stable  Objective: Vital signs in last 24 hours: Temp:  [97.2 F (36.2 C)-99.4 F (37.4 C)] 98 F (36.7 C) (03/17 0301) Pulse Rate:  [80-103] 89 (03/17 0900) Resp:  [20-33] 22 (03/17 0900) BP: (72-145)/(45-75) 114/53 (03/17 0900) SpO2:  [86 %-100 %] 99 % (03/17 0900) Arterial Line BP: (82-117)/(45-53) 102/45 (03/17 0800) FiO2 (%):  [50 %-60 %] 50 % (03/17 0342) Weight:  [91.3 kg (201 lb 4.5 oz)] 91.3 kg (201 lb 4.5 oz) (03/17 0410) Last BM Date: 07/12/16  Intake/Output from previous day: 03/16 0701 - 03/17 0700 In: 2328.5 [I.V.:1528.5; IV Piggyback:800] Out: 3165 [Urine:1045; Chest Tube:2120] Intake/Output this shift: Total I/O In: 318.6 [I.V.:318.6] Out: 60 [Urine:60]  Exam: Intubated Lungs with coarse BS bilaterally Abdomen soft, incisions clean  Lab Results:   Recent Labs  07/14/16 0522 07/15/16 0930  WBC 15.7* 12.5*  HGB 11.4* 10.5*  HCT 36.7* 32.9*  PLT 80* 53*   BMET  Recent Labs  07/14/16 0522 07/15/16 0500  NA 138 138  K 4.2 3.3*  CL 93* 94*  CO2 36* 35*  GLUCOSE 212* 250*  BUN 18 25*  CREATININE 0.91 1.04  CALCIUM 8.2* 8.1*   PT/INR No results for input(s): LABPROT, INR in the last 72 hours. ABG  Recent Labs  07/14/16 1235 07/14/16 1527  PHART 7.194* 7.471*  HCO3 39.6* 37.5*    Studies/Results: Dg Chest Port 1 View  Result Date: 07/15/2016 CLINICAL DATA:  Acute respiratory failure. EXAM: PORTABLE CHEST 1 VIEW COMPARISON:  07/14/2016 FINDINGS: Endotracheal tube with tip 4.3 cm above the carina. Visualize nasogastric tube unchanged. Bibasilar chest tubes unchanged. Lungs are adequately inflated with continued improvement in small loculated bibasilar pneumothoraces. Persistent bibasilar opacification likely atelectasis with possible small amount of bilateral pleural fluid. Slight worsening hazy opacification over the right midlung which may be due to atelectasis or infection.  Cardiomediastinal silhouette and remainder of the exam is unchanged. IMPRESSION: Slight worsening hazy opacification over the right midlung which may be due to atelectasis or infection. Persistent bibasilar opacification likely atelectasis with possible small bilateral pleural effusions. Bibasilar chest tubes in place with continued interval improvement of small loculated pneumothoraces. Remaining tubes and lines as described. Electronically Signed   By: Marin Olp M.D.   On: 07/15/2016 08:55   Dg Chest Port 1 View  Result Date: 07/14/2016 CLINICAL DATA:  Status post intubation. EXAM: PORTABLE CHEST 1 VIEW COMPARISON:  07/14/2016 at 12:10 p.m. FINDINGS: The endotracheal tube tip projects 3.6 cm above the carina, well positioned. New nasal/ orogastric tube passes below the diaphragm and below the included field of view, into the stomach. Bilateral lower hemithorax chest tubes are stable. The the home pneumothoraces noted in the lower lungs on the prior study have mildly decreased in size. No other change. IMPRESSION: 1. Well-positioned endotracheal tube. Nasal/orogastric tube passes below the diaphragm into the stomach, but below the included field of view. 2. Mild decrease in the size of the bilateral lower lung zone pneumothoraces. Electronically Signed   By: Lajean Manes M.D.   On: 07/14/2016 13:53   Dg Chest Port 1 View  Result Date: 07/14/2016 CLINICAL DATA:  Status post placement of bilateral chest tubes. EXAM: PORTABLE CHEST 1 VIEW COMPARISON:  07/14/2016 at 4:45 a.m. FINDINGS: Sincerely exam, bilateral lower hemithorax chest tubes have been placed. On the right, a chest tube tip projects inferior to the right hilar structures. On the left, the chest tube tip projects over the  mid to lower left mid hemithorax. There has been a significant decrease in pleural fluid. Loculated areas noted along the peripheral right mid to lower lung, bb bordering consolidated, partly collapsed lung. On the left,  there is a loculated pneumothorax at the lateral left mid to lower hemithorax. Hazy opacities noted in the partly collapsed lower left lung. There is no apical pneumothorax on either side. IMPRESSION: 1. Status post chest tube placement in both lower hemithoraces with significant decrease in pleural fluid. 2. There are now pneumothoraces in the mid to lower hemithoraces. On the right, this appears loculated. This is more simple in appearance on the left. The partly collapsed lower lungs are densely consolidated on the right with more hazy consolidation on the left, which may be atelectasis, edema or infection, or a combination. Electronically Signed   By: Lajean Manes M.D.   On: 07/14/2016 12:39   Portable Chest Xray  Result Date: 07/14/2016 CLINICAL DATA:  Respiratory failure. EXAM: PORTABLE CHEST 1 VIEW COMPARISON:  07/13/2016. FINDINGS: Interim extubation removal of NG tube. Heart size stable. Pulmonary venous congestion and bilateral pulmonary infiltrates with bilateral pleural effusions suggesting CHF. Bilateral pneumonia cannot be excluded. These findings unchanged prior exam. No pneumothorax. IMPRESSION: 1. Interim extubation removal of NG tube. 2. Pulmonary venous congestion and bilateral pulmonary infiltrates with bilateral pleural effusions again noted. Findings suggest congestive heart failure. Bilateral pneumonia cannot be excluded. No interim change from prior exam. Electronically Signed   By: Marcello Moores  Register   On: 07/14/2016 06:52   Portable Chest Xray  Result Date: 07/13/2016 CLINICAL DATA:  Respiratory failure.  Endotracheal tube placement. EXAM: PORTABLE CHEST 1 VIEW COMPARISON:  07/12/2016 and prior radiograph FINDINGS: An endotracheal tube is identified with tip 5 cm above the carina. An NG tube is present with tip difficult to visualize but appears to overlie the distal esophagus. Moderate bilateral pleural effusions, right greater than left, and bilateral lower lung atelectasis/  airspace disease again noted. Pulmonary vascular congestion is present. There is no evidence of pneumothorax. IMPRESSION: Support apparatus as described. NG tube tip difficult to visualize but appears to overlie the lower esophagus. Moderate pleural effusions and bilateral lower lung atelectasis/ airspace disease again noted. Electronically Signed   By: Margarette Canada M.D.   On: 07/13/2016 17:39    Anti-infectives: Anti-infectives    Start     Dose/Rate Route Frequency Ordered Stop   07/15/16 0600  vancomycin (VANCOCIN) 1,250 mg in sodium chloride 0.9 % 250 mL IVPB     1,250 mg 166.7 mL/hr over 90 Minutes Intravenous Every 12 hours 07/14/16 1901     07/15/16 0200  piperacillin-tazobactam (ZOSYN) IVPB 3.375 g     3.375 g 12.5 mL/hr over 240 Minutes Intravenous Every 8 hours 07/14/16 1901     07/14/16 1930  vancomycin (VANCOCIN) 1,500 mg in sodium chloride 0.9 % 500 mL IVPB     1,500 mg 250 mL/hr over 120 Minutes Intravenous  Once 07/14/16 1901 07/14/16 2251   07/14/16 1930  piperacillin-tazobactam (ZOSYN) IVPB 3.375 g     3.375 g 100 mL/hr over 30 Minutes Intravenous  Once 07/14/16 1901 07/14/16 2058      Assessment/Plan: s/p Procedure(s): LAPAROSCOPIC SPLENECTOMY (N/A)  Platelets down some today.  Hem-onc following.  Repeat tomorrow If he stays intubated long, will start tube feeds  LOS: 6 days    Velvie Thomaston A 07/15/2016

## 2016-07-16 ENCOUNTER — Inpatient Hospital Stay (HOSPITAL_COMMUNITY): Payer: Medicare HMO

## 2016-07-16 DIAGNOSIS — Z9889 Other specified postprocedural states: Secondary | ICD-10-CM

## 2016-07-16 DIAGNOSIS — R579 Shock, unspecified: Secondary | ICD-10-CM

## 2016-07-16 LAB — CBC WITH DIFFERENTIAL/PLATELET
BASOS PCT: 0 %
Basophils Absolute: 0 10*3/uL (ref 0.0–0.1)
EOS ABS: 0 10*3/uL (ref 0.0–0.7)
EOS PCT: 0 %
HCT: 31.9 % — ABNORMAL LOW (ref 39.0–52.0)
Hemoglobin: 10.4 g/dL — ABNORMAL LOW (ref 13.0–17.0)
Lymphocytes Relative: 14 %
Lymphs Abs: 2 10*3/uL (ref 0.7–4.0)
MCH: 27.4 pg (ref 26.0–34.0)
MCHC: 32.6 g/dL (ref 30.0–36.0)
MCV: 83.9 fL (ref 78.0–100.0)
MONO ABS: 0.6 10*3/uL (ref 0.1–1.0)
MONOS PCT: 4 %
Neutro Abs: 11.3 10*3/uL — ABNORMAL HIGH (ref 1.7–7.7)
Neutrophils Relative %: 82 %
Platelets: 36 10*3/uL — ABNORMAL LOW (ref 150–400)
RBC: 3.8 MIL/uL — ABNORMAL LOW (ref 4.22–5.81)
RDW: 16.3 % — ABNORMAL HIGH (ref 11.5–15.5)
WBC: 13.8 10*3/uL — ABNORMAL HIGH (ref 4.0–10.5)

## 2016-07-16 LAB — BASIC METABOLIC PANEL
ANION GAP: 8 (ref 5–15)
BUN: 24 mg/dL — ABNORMAL HIGH (ref 6–20)
CALCIUM: 8 mg/dL — AB (ref 8.9–10.3)
CO2: 37 mmol/L — ABNORMAL HIGH (ref 22–32)
Chloride: 93 mmol/L — ABNORMAL LOW (ref 101–111)
Creatinine, Ser: 0.96 mg/dL (ref 0.61–1.24)
Glucose, Bld: 241 mg/dL — ABNORMAL HIGH (ref 65–99)
POTASSIUM: 3.1 mmol/L — AB (ref 3.5–5.1)
SODIUM: 138 mmol/L (ref 135–145)

## 2016-07-16 LAB — COMPREHENSIVE METABOLIC PANEL
ALBUMIN: 1.7 g/dL — AB (ref 3.5–5.0)
ALT: 9 U/L — ABNORMAL LOW (ref 17–63)
AST: 16 U/L (ref 15–41)
Alkaline Phosphatase: 66 U/L (ref 38–126)
Anion gap: 6 (ref 5–15)
BUN: 26 mg/dL — ABNORMAL HIGH (ref 6–20)
CALCIUM: 8 mg/dL — AB (ref 8.9–10.3)
CHLORIDE: 94 mmol/L — AB (ref 101–111)
CO2: 39 mmol/L — ABNORMAL HIGH (ref 22–32)
CREATININE: 0.89 mg/dL (ref 0.61–1.24)
GFR calc non Af Amer: >60 mL/min (ref >60)
Glucose, Bld: 130 mg/dL — ABNORMAL HIGH (ref 65–99)
Potassium: 2.7 mmol/L — CL (ref 3.5–5.1)
SODIUM: 139 mmol/L (ref 135–145)
TOTAL PROTEIN: 4.9 g/dL — AB (ref 6.5–8.1)
Total Bilirubin: 0.8 mg/dL (ref 0.3–1.2)

## 2016-07-16 LAB — BLOOD GAS, ARTERIAL
Acid-Base Excess: 13.9 mmol/L — ABNORMAL HIGH (ref 0.0–2.0)
Bicarbonate: 37.4 mmol/L — ABNORMAL HIGH (ref 20.0–28.0)
DRAWN BY: 11249
FIO2: 40
O2 Saturation: 95.5 %
PEEP: 5 cmH2O
Patient temperature: 99.7
RATE: 22 resp/min
VT: 600 mL
pCO2 arterial: 40.5 mmHg (ref 32.0–48.0)
pH, Arterial: 7.574 — ABNORMAL HIGH (ref 7.350–7.450)
pO2, Arterial: 72.8 mmHg — ABNORMAL LOW (ref 83.0–108.0)

## 2016-07-16 LAB — URINE CULTURE: Culture: NO GROWTH

## 2016-07-16 LAB — GLUCOSE, CAPILLARY
GLUCOSE-CAPILLARY: 118 mg/dL — AB (ref 65–99)
GLUCOSE-CAPILLARY: 221 mg/dL — AB (ref 65–99)
GLUCOSE-CAPILLARY: 224 mg/dL — AB (ref 65–99)
GLUCOSE-CAPILLARY: 227 mg/dL — AB (ref 65–99)
GLUCOSE-CAPILLARY: 318 mg/dL — AB (ref 65–99)
Glucose-Capillary: 188 mg/dL — ABNORMAL HIGH (ref 65–99)
Glucose-Capillary: 240 mg/dL — ABNORMAL HIGH (ref 65–99)

## 2016-07-16 LAB — MAGNESIUM
MAGNESIUM: 2 mg/dL (ref 1.7–2.4)
Magnesium: 1.5 mg/dL — ABNORMAL LOW (ref 1.7–2.4)

## 2016-07-16 LAB — PH, BODY FLUID: pH, Body Fluid: 7.8

## 2016-07-16 LAB — PHOSPHORUS: PHOSPHORUS: 1.7 mg/dL — AB (ref 2.5–4.6)

## 2016-07-16 MED ORDER — DEXTROSE 5 % IV SOLN
2.0000 g | Freq: Three times a day (TID) | INTRAVENOUS | Status: DC
  Administered 2016-07-16 – 2016-07-22 (×18): 2 g via INTRAVENOUS
  Filled 2016-07-16 (×21): qty 2

## 2016-07-16 MED ORDER — HYDROCORTISONE NA SUCCINATE PF 100 MG IJ SOLR
50.0000 mg | Freq: Four times a day (QID) | INTRAMUSCULAR | Status: DC
  Administered 2016-07-16 – 2016-07-18 (×8): 50 mg via INTRAVENOUS
  Filled 2016-07-16 (×8): qty 2

## 2016-07-16 MED ORDER — FUROSEMIDE 10 MG/ML IJ SOLN
20.0000 mg | Freq: Four times a day (QID) | INTRAMUSCULAR | Status: AC
  Administered 2016-07-16 – 2016-07-17 (×3): 20 mg via INTRAVENOUS
  Filled 2016-07-16 (×3): qty 2

## 2016-07-16 MED ORDER — NOREPINEPHRINE BITARTRATE 1 MG/ML IV SOLN
0.0000 ug/min | INTRAVENOUS | Status: DC
  Administered 2016-07-16: 6 ug/min via INTRAVENOUS
  Administered 2016-07-16: 12 ug/min via INTRAVENOUS
  Filled 2016-07-16 (×2): qty 16

## 2016-07-16 MED ORDER — POTASSIUM PHOSPHATES 15 MMOLE/5ML IV SOLN
30.0000 mmol | Freq: Once | INTRAVENOUS | Status: AC
  Administered 2016-07-16: 30 mmol via INTRAVENOUS
  Filled 2016-07-16: qty 10

## 2016-07-16 MED ORDER — POTASSIUM CHLORIDE 20 MEQ/15ML (10%) PO SOLN
40.0000 meq | Freq: Once | ORAL | Status: AC
  Administered 2016-07-16: 40 meq
  Filled 2016-07-16: qty 30

## 2016-07-16 MED ORDER — SODIUM PHOSPHATES 45 MMOLE/15ML IV SOLN: 20.0000 mmol | Freq: Once | INTRAVENOUS | Status: DC

## 2016-07-16 MED ORDER — POTASSIUM CHLORIDE 20 MEQ/15ML (10%) PO SOLN
40.0000 meq | ORAL | Status: AC
  Administered 2016-07-16 (×2): 40 meq
  Filled 2016-07-16 (×2): qty 30

## 2016-07-16 MED ORDER — ALBUMIN HUMAN 25 % IV SOLN
25.0000 g | Freq: Four times a day (QID) | INTRAVENOUS | Status: AC
  Administered 2016-07-16 – 2016-07-17 (×4): 25 g via INTRAVENOUS
  Filled 2016-07-16: qty 100
  Filled 2016-07-16: qty 50
  Filled 2016-07-16 (×2): qty 100

## 2016-07-16 MED ORDER — SODIUM CHLORIDE 0.9 % IV SOLN
6.0000 g | Freq: Once | INTRAVENOUS | Status: DC
  Administered 2016-07-16: 6 g via INTRAVENOUS
  Filled 2016-07-16: qty 12

## 2016-07-16 MED ORDER — MAGNESIUM SULFATE 4 GM/100ML IV SOLN: 4.0000 g | Freq: Once | INTRAVENOUS | Status: DC

## 2016-07-16 MED ORDER — SODIUM CHLORIDE 0.9 % IV SOLN
Freq: Once | INTRAVENOUS | Status: AC
  Administered 2016-07-16: 10:00:00 via INTRAVENOUS

## 2016-07-16 NOTE — Progress Notes (Signed)
Consulted with pharmacy in regards to mag sulfate rate. [place] Pharmacist said rate was correct, will go over 6hrs

## 2016-07-16 NOTE — Progress Notes (Signed)
eLink Physician-Brief Progress Note Patient Name: [name] DOB: 08-08-1940 MRN: [id number]   Date of Service  07/16/2016  HPI/Events of Note  K, phos low  eICU Interventions  k phos replacement     Intervention Category Major Interventions: Electrolyte abnormality - evaluation and management  Simonne Maffucci 07/16/2016, 4:36 AM

## 2016-07-16 NOTE — Progress Notes (Signed)
HEMATOLOGY-ONCOLOGY PROGRESS NOTE  SUBJECTIVE: Patient is awake underwent. He is able to converse with the help of the report. He appears to be in mild to moderate degree of pain and discomfort. He has diffuse swelling of his extremities. There appears to be a small amount of blood from the chest tube drain site. He has required blood pressure support with Levophed.  OBJECTIVE: REVIEW OF SYSTEMS:   Orogastric tube and endotracheal tube are in place. Patient did complain of pain for which he received pain medication.  PHYSICAL EXAMINATION: ECOG PERFORMANCE STATUS: 4 - Bedbound  Vitals:   07/16/16 0759 07/16/16 0800  BP:  (!) 90/47  Pulse:  83  Resp:  (!) 22  Temp: 98 F (36.7 C)    Filed Weights   07/14/16 0455 07/15/16 0410 07/16/16 0500  Weight: 204 lb 12.9 oz (92.9 kg) 201 lb 4.5 oz (91.3 kg) 200 lb 13.4 oz (91.1 kg)    GENERAL:alert, no distress and comfortable EYES: normal, Conjunctiva are pink and non-injected, sclera clear LUNGS: Good breath sounds bilaterally HEART: regular rate & rhythm and no murmurs ABDOMEN: Recent surgery for splenectomy Extremities: Profound upper and lower extremity edema NEURO: alert & oriented x 3 with fluent speech, no focal motor/sensory deficits  LABORATORY DATA:  I have reviewed the data as listed CMP Latest Ref Rng & Units 07/16/2016 07/15/2016 07/14/2016  Glucose 65 - 99 mg/dL 130(H) 250(H) 212(H)  BUN 6 - 20 mg/dL 26(H) 25(H) 18  Creatinine 0.61 - 1.24 mg/dL 0.89 1.04 0.91  Sodium 135 - 145 mmol/L 139 138 138  Potassium 3.5 - 5.1 mmol/L 2.7(LL) 3.3(L) 4.2  Chloride 101 - 111 mmol/L 94(L) 94(L) 93(L)  CO2 22 - 32 mmol/L 39(H) 35(H) 36(H)  Calcium 8.9 - 10.3 mg/dL 8.0(L) 8.1(L) 8.2(L)  Total Protein 6.5 - 8.1 g/dL 4.9(L) - 6.2(L)  Total Bilirubin 0.3 - 1.2 mg/dL 0.8 - 0.7  Alkaline Phos 38 - 126 U/L 66 - 71  AST 15 - 41 U/L 16 - 28  ALT 17 - 63 U/L 9(L) - 13(L)    Lab Results  Component Value Date   WBC 13.8 (H) 07/16/2016   HGB 10.4 (L) 07/16/2016   HCT 31.9 (L) 07/16/2016   MCV 83.9 07/16/2016   PLT 36 (L) 07/16/2016   NEUTROABS 11.3 (H) 07/16/2016    ASSESSMENT AND PLAN: 1. Acute ITP: Status post Splenectomy. It may take several days with a platelet count to improve. His platelet count today is 36. I would like to keep his platelets were 50 given the recent surgeries. I will administer 1 unit of pheresis platelets today.  2. respiratory failure and hypotension: Being managed by intensive care. 3. Bilateral pleural effusions with chest tubes: Cytology is pending. Currently on broad-spectrum antibiotic with Zosyn 4. Severe anasarca: BNP 95 Because his liver and kidney function are normal, I suspect it is related to severe hypoalbuminemia. His albumin level is 1.7.

## 2016-07-16 NOTE — Progress Notes (Signed)
[facility] ADULT ICU REPLACEMENT PROTOCOL FOR AM LAB REPLACEMENT ONLY  The patient does apply for the [facility] Adult ICU Electrolyte Replacment Protocol based on the criteria listed below:   1. Is GFR >/= 40 ml/min? Yes.    Patient's GFR today is >60 2. Is urine output >/= 0.5 ml/kg/hr for the last 6 hours? Yes.   Patient's UOP is 0.65 ml/kg/hr 3. Is BUN < 60 mg/dL? Yes.    Patient's BUN today is 26 4. Abnormal electrolyte  K 2.7, mg 1.7, phos 1.7 5. Ordered repletion with: per protocol 6. If a panic level lab has been reported, has the CCM MD in charge been notified? Yes.  .   Physician:  Philbert Riser 07/16/2016 4:15 AM

## 2016-07-16 NOTE — Progress Notes (Addendum)
Progress Note  Patient Name: [name] Date of Encounter: 07/16/2016  Primary Cardiologist: Dr Cyril Mourning  Subjective   Patient had bilatearl CT placed yesterday with 1600cc of pleural fluid drained from left pleural space and 900cc from right pleural space.  Pulled out endotracheal tube and did not tolerate BiPAP and now reintubated.  CVP is running around 7 and albumin down to 1.7.  Inpatient Medications    Scheduled Meds: . sodium chloride   Intravenous Once  . sodium chloride   Intravenous Once  . sodium chloride   Intravenous Once  . sodium chloride   Intravenous Once  . amiodarone  150 mg Intravenous Once  . atorvastatin  40 mg Per Tube q1800  . budesonide (PULMICORT) nebulizer solution  0.25 mg Nebulization BID  . carvedilol  3.125 mg Per Tube BID WC  . chlorhexidine gluconate (MEDLINE KIT)  15 mL Mouth Rinse BID  . Chlorhexidine Gluconate Cloth  6 each Topical Daily  . insulin aspart  0-5 Units Subcutaneous Q4H  . ipratropium  0.5 mg Nebulization Q6H  . levalbuterol  1.25 mg Nebulization Q6H  . magnesium sulfate LVP 250-500 ml  6 g Intravenous Once  . mouth rinse  15 mL Mouth Rinse QID  . pantoprazole (PROTONIX) IV  40 mg Intravenous Q12H  . piperacillin-tazobactam (ZOSYN)  IV  3.375 g Intravenous Q8H  . potassium chloride  40 mEq Oral Once  . potassium phosphate IVPB (mmol)  30 mmol Intravenous Once  . predniSONE  5 mg Per Tube Q breakfast  . sodium chloride flush  10-40 mL Intracatheter Q12H  . vancomycin  1,250 mg Intravenous Q12H   Continuous Infusions: . amiodarone 30 mg/hr (07/15/16 2120)  . norepinephrine (LEVOPHED) Adult infusion 9 mcg/min (07/16/16 0800)   PRN Meds: acetaminophen, ALPRAZolam, chlorpheniramine-HYDROcodone, fentaNYL (SUBLIMAZE) injection, levalbuterol, ondansetron (ZOFRAN) IV, oxymetazoline, sodium chloride flush   Vital Signs    Vitals:   07/16/16 0600 07/16/16 0700 07/16/16 0759 07/16/16 0800  BP:    (!) 90/47  Pulse: 85  90  83  Resp: (!) 22 (!) 26  (!) 22  Temp:   98 F (36.7 C)   TempSrc:   Oral   SpO2: 92% 97%  98%  Weight:      Height:        Intake/Output Summary (Last 24 hours) at 07/16/16 0852 Last data filed at 07/16/16 0800  Gross per 24 hour  Intake          2816.88 ml  Output             2570 ml  Net           246.88 ml   Filed Weights   07/14/16 0455 07/15/16 0410 07/16/16 0500  Weight: 204 lb 12.9 oz (92.9 kg) 201 lb 4.5 oz (91.3 kg) 200 lb 13.4 oz (91.1 kg)    Telemetry     NSR - Personally Reviewed  ECG     03/10 AF with RVR vs PSVT.  03/11 NSR- Personally Reviewed  Physical Exam   GEN: intubated and sedated Neck: No JVD seen but difficult to assess 2nd lines Cardiac: RRR no murmur/R or gallops.  Marked anasarca with 3+ pitting edema in hands and legs Respiratory: decreased breath sounds with faint rhonchi and mild wheezing anteriorly GI: Soft, nontender, non-distended  MS: 3+ LE edema and in hands; No deformity. Neuro:  cannot assess as patient is sedated  Psych: cannot assess as he is sedated  Labs  Chemistry  Recent Labs Lab 07/13/16 0341  07/14/16 0522 07/15/16 0500 07/16/16 0310  NA 138  < > 138 138 139  K 4.0  < > 4.2 3.3* 2.7*  CL 86*  --  93* 94* 94*  CO2 43*  --  36* 35* 39*  GLUCOSE 225*  --  212* 250* 130*  BUN 15  --  18 25* 26*  CREATININE 0.75  --  0.91 1.04 0.89  CALCIUM 8.7*  --  8.2* 8.1* 8.0*  PROT 6.7  --  6.2*  --  4.9*  ALBUMIN 2.6*  --  2.6*  --  1.7*  AST 18  --  28  --  16  ALT 15*  --  13*  --  9*  ALKPHOS 78  --  71  --  66  BILITOT 0.8  --  0.7  --  0.8  GFRNONAA >60  --  >60 >60 >60  GFRAA >60  --  >60 >60 >60  ANIONGAP 9  --  '9 9 6  '$ < > = values in this interval not displayed.   Hematology  Recent Labs Lab 07/14/16 0522 07/15/16 0930 07/16/16 0310  WBC 15.7* 12.5* 13.8*  RBC 4.18* 3.82* 3.80*  HGB 11.4* 10.5* 10.4*  HCT 36.7* 32.9* 31.9*  MCV 87.8 86.1 83.9  MCH 27.3 27.5 27.4  MCHC 31.1 31.9 32.6  RDW  16.6* 16.4* 16.3*  PLT 80* 53* 36*    Cardiac Enzymes  Recent Labs Lab 07/09/16 1722  TROPONINI <0.03   No results for input(s): TROPIPOC in the last 168 hours.   BNP  Recent Labs Lab 07/15/16 0900  BNP 95.6     Radiology    PORTABLE CHEST 1 VIEW 07/14/16  COMPARISON:  07/13/2016.  FINDINGS: Interim extubation removal of NG tube. Heart size stable. Pulmonary venous congestion and bilateral pulmonary infiltrates with bilateral pleural effusions suggesting CHF. Bilateral pneumonia cannot be excluded. These findings unchanged prior exam. No pneumothorax.  IMPRESSION: 1. Interim extubation removal of NG tube.  2. Pulmonary venous congestion and bilateral pulmonary infiltrates with bilateral pleural effusions again noted. Findings suggest congestive heart failure. Bilateral pneumonia cannot be excluded. No interim change from prior exam.    Cardiac Studies   Echo 07/09/16- Study Conclusions - Left ventricle: The cavity size was normal. Wall thickness was   increased in a pattern of mild LVH. Systolic function was normal.   The estimated ejection fraction was in the range of 60% to 65%.   Although no diagnostic regional wall motion abnormality was   identified, this possibility cannot be completely excluded on the   basis of this study. Features are consistent with a pseudonormal   left ventricular filling pattern, with concomitant abnormal   relaxation and increased filling pressure (grade 2 diastolic   dysfunction). - Mitral valve: Calcified annulus. Mildly calcified leaflets .   There was trivial regurgitation. - Right ventricle: The cavity size was mildly dilated. - Right atrium: Central venous pressure (est): 3 mm Hg. - Atrial septum: No defect or patent foramen ovale was identified. - Tricuspid valve: There was trivial regurgitation. - Pericardium, extracardiac: There was a right pleural effusion.  Impressions:  - Overall limited images. Mild  LVH with LVEF 60-65%. Probable grade   2 diastolic dysfunction. Calcified mitral annulus with trivial   mitral regurgitation. Aortic valve is moderately calcified and   not well seen. Right ventricle appears mildly dilated. Trivial   tricuspid regurgitation. Right pleural effusion is evident  Patient Profile     76 y.o. 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 Since admission his rate has been difficult to control but has converted to NSR with IV Amiodarone. Not a candidate for anticoagulation with undetectable PTLs and active bleeding requiring transfusion of blood and platelets. He has recurrent bilateral pleural effusions and went for thoracentesis 3/14 in preparation for splenectomy 3/15. The thoracentesis was only partially successful as the pt developed hypotension and the procedure had to be aborted. The did have his splenectomy 07/14/15 and is for repeat thoracentesis.   Assessment & Plan    1. AF with RVR vs PSVT- he has been maintaining NSR with no further afib or SVT. Continue  IV Amiodarone while intubated. Cardizem gtt was stopped yesterday as patient was on pressors for soft BP.  Will hold coreg for now so we have BP room to wean off Levophed.   2. 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  - platelets still low at 36  3. Blood loss anemia- Hgb dropped from 11.2 to 7. Transfused 07/10/16 and stable at 10.4.   4. Pleural effusions- Recurrent pleural effusions this admission - thoracentesis 07/12/16- partially successful-1.2L removed from Lt but then pt became hypotensive.  - repeat thoracentesis yesterday with 1600cc of serosanguinous fluid on left and 900cc on right s/p bilateral chest tubes.  5. Acute on chronic diastolic CHF EF 93-26% with LVH and grade 2 DD - he has marked anasarca that I suspect is due more to 3rd  spacing.  His albumin yesterday was 2.6 and today is 1.7.   - he put out 2.5L yesterday and now o ly 413cc positive.  No significant change in weight since admission but I suspect that he has lost body mass since his surgery and would expect weight to have dropped some so likely he has maintained his weight with the volume overload from 3rd spacing.  - Cxray from today shows no CHF.  BNP is 95 and CVP 7.  Anasarca is due to 3rd spacing from severe hypoalbuminemia and not from CHF.  Discussed with CCM and they will give Albumin with Lasix today - He is currently on Levophed for pressor support despite stopping Cardizem gtt.  Will stop Coreg since he has been maintaining NSR and is on IV Amio for afib suppression. Hopefully this will improve VP and allow for Levophed to be weaned.  6.  Hypokalemia - replete and keep > 4 per CCM - check Mag level   Signed, Fransico Him, MD  07/16/2016, 8:52 AM

## 2016-07-16 NOTE — Progress Notes (Signed)
PULMONARY / CRITICAL CARE MEDICINE   Name: [name] MRN: [id number] DOB: Oct 14, 1940    ADMISSION DATE:  07/08/2016 CONSULTATION DATE: 3/15  REFERRING MD:  Barry Dienes   CHIEF COMPLAINT:  Vent management    SUBJECTIVE:  No events overnight  VITAL SIGNS: BP (!) 90/47   Pulse 83   Temp 98 F (36.7 C) (Oral)   Resp (!) 22   Ht 5\' 11"  (1.803 m)   Wt 91.1 kg (200 lb 13.4 oz)   SpO2 98%   BMI 28.01 kg/m  100% NRB HEMODYNAMICS: CVP:  [6 mmHg-7 mmHg] 7 mmHg  VENTILATOR SETTINGS: Vent Mode: PRVC FiO2 (%):  [40 %-50 %] 40 % Set Rate:  [22 bmp] 22 bmp Vt Set:  [600 mL] 600 mL PEEP:  [5 cmH20] 5 cmH20 Plateau Pressure:  [25 cmH20-29 cmH20] 25 cmH20  INTAKE / OUTPUT: I/O last 3 completed shifts: In: 4151.3 [I.V.:2661.3; NG/GT:120; IV Piggyback:1370] Out: 7017 [Urine:2400; Chest Tube:1410]  PHYSICAL EXAMINATION: General appearance:  Chronically ill appearing male that is acutely ill now Mouth:  MMM Neck: Trachea midline, neck supple, -JVD Lungs/chest: Diffuse crackles, CT bilaterally noted CV: IRIR, Nl S1/S2, -M/R/G. Abdomen: Soft, non-tender; no masses or HSM Extremities: + edema Skin: Normal temperature, turgor and texture; no rash, ulcers or subcutaneous nodules Neuro/Psych: sedated, but opens eyes and now trying to talk more. Oriented x2. Strength equal bilateral   LABS:  BMET  Recent Labs Lab 07/14/16 0522 07/15/16 0500 07/16/16 0310  NA 138 138 139  K 4.2 3.3* 2.7*  CL 93* 94* 94*  CO2 36* 35* 39*  BUN 18 25* 26*  CREATININE 0.91 1.04 0.89  GLUCOSE 212* 250* 130*    Electrolytes  Recent Labs Lab 07/13/16 0341 07/14/16 0522 07/15/16 0500 07/15/16 0530 07/16/16 0310  CALCIUM 8.7* 8.2* 8.1*  --  8.0*  MG 1.5* 1.6*  --  1.5* 1.5*  PHOS 3.6 4.5  --   --  1.7*   CBC  Recent Labs Lab 07/14/16 0522 07/15/16 0930 07/16/16 0310  WBC 15.7* 12.5* 13.8*  HGB 11.4* 10.5* 10.4*  HCT 36.7* 32.9* 31.9*  PLT 80* 53* 36*   Coag's No results for  input(s): APTT, INR in the last 168 hours. Sepsis Markers No results for input(s): LATICACIDVEN, PROCALCITON, O2SATVEN in the last 168 hours.  ABG  Recent Labs Lab 07/14/16 1235 07/14/16 1527 07/16/16 0500  PHART 7.194* 7.471* 7.574*  PCO2ART 107* 52.1* 40.5  PO2ART 113* 95.0 72.8*   Liver Enzymes  Recent Labs Lab 07/13/16 0341 07/14/16 0522 07/16/16 0310  AST 18 28 16   ALT 15* 13* 9*  ALKPHOS 78 71 66  BILITOT 0.8 0.7 0.8  ALBUMIN 2.6* 2.6* 1.7*   Cardiac Enzymes  Recent Labs Lab 07/09/16 1722  TROPONINI <0.03   Glucose  Recent Labs Lab 07/15/16 1205 07/15/16 1700 07/15/16 2056 07/16/16 0035 07/16/16 0314 07/16/16 0757  GLUCAP 187* 244* 382* 318* 118* 188*   Imaging Dg Chest Port 1 View  Result Date: 07/16/2016 CLINICAL DATA:  Endotracheal tube placement. EXAM: PORTABLE CHEST 1 VIEW COMPARISON:  07/15/2016 FINDINGS: Endotracheal tube unchanged with tip 4.1 cm above the carina. The visualized portions of the nasogastric tube unchanged. Left basilar chest tube unchanged. Interval placement of right-sided PICC line with tip overlying the SVC. Right basilar chest tube unchanged. Lungs are adequately inflated demonstrate persistent hazy opacification over the mid to lower right lung and left base without significant change. Small bilateral pleural effusions right greater than left unchanged. Previous  noted small bibasilar loculated pneumothoraces more difficult to visualize as there are smaller with more fluid density. Cardiomediastinal silhouette and remainder of the exam is unchanged. IMPRESSION: Stable hazy opacification over the mid to lower right lung and left base. Findings may be due to infection with small bilateral effusions right greater than left. Previously noted small bibasilar loculated pneumothoraces more difficult to visualize as there are smaller and more filled in with pleural fluid. Tubes and lines as described. Electronically Signed   By: Marin Olp M.D.   On: 07/16/2016 07:23     STUDIES/events Splenectomy 3/15.  3/15 self extubaetd Right thora 3/16.Marland Kitchen 800 clearly exudate. Appears like old blood Pleural cytology 3/16>>> 3/16 bilateral chest tubes placed d/t old bloody appearing effusions w/ significant fibrinous debris noted on [place].     CULTURES: Pleural fluid 3/16>>>  ANTIBIOTICS:   SIGNIFICANT EVENTS: Left thora 1.2 blood tinged fluid   LINES/TUBES: OETT 3/15>>>3/16 (self extubated) ETT 3/16>>> Bilateral Chest tubes 3/16 R PICC 3/17>>>  ASSESSMENT / PLAN:  Acute on chronic respiratory failure in setting of large bilateral Exudative Pleural effusions, superimposed un underlying COPD and BTX.  -self extubated 3/16 -the etiology if these effusions are unclear. They are exudate for certain. On last tap: mixed lymphoid and reactive mesos.  -placed Bilateral Chest tubes 3/16. Effusions appear like old blood. Wonder if he had bleed at some point. Lots of fibrinous changes on [place] eval  Plan Both CT to suction (output of 100 and 120) and output is clearing up. Quantify output from CT daily ABG and adjust vent accordingly Titrate O2 for sats 88-92% May need repeat CT imaging of chest to further eval pleural space. If we think that this is old blood and chest not fully evacuated may need to consider TPA/DNase (not surgical candidate), will repeat chest CT on Monday pending output. Scheduled BDs.  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  Thrombocytopenia -->improved (got platelets 3/15) Blood loss anemia  -no active bleeding currently Plan Repeat CBC. Transfuse PRN. SCDs. Amicar ordered per H/O Transfuse 1 unit pRBC today Post-op care per surg Cont pred via tube but will add stress dose steroids today  PAF. Currently w/ CVR Hypovolemic shock (severely low albumin) Plan Cont amio and cardiazem per cards F/U CVP Levophed for BP support Stress dose steroids Coreg off per cards  Diastolic  HF Plan Albumin and lasix ordered D/C coreg per cards  Pain, mild acute encephalopathy.  -may be in part d/t rising CO2. Plan Limit sedation as able Cont supportive care  Leukocytosis  -does have thick yellow nasal and oral secretions. Certainly aspiration risk w/ poor cough mechanics.  Plan Trend fever curve PCT algo Continue vanc/zosyn  DM w/ hyperglycemia  Plan ISS CBGs   DVT prophylaxis: SCD SUP: PPI Diet: NPO Activity: bedrest Disposition : ICU  Family updated bedside  The patient is critically ill with multiple organ systems failure and requires high complexity decision making for assessment and support, frequent evaluation and titration of therapies, application of advanced monitoring technologies and extensive interpretation of multiple databases.   Critical Care Time devoted to patient care services described in this note is  35  Minutes. This time reflects time of care of this signee Dr Jennet Maduro. This critical care time does not reflect procedure time, or teaching time or supervisory time of PA/NP/Med student/Med Resident etc but could involve care discussion time.  Rush Farmer, M.D. [facility] Pulmonary/Critical Care Medicine. Pager: [phone]. After hours pager: [phone].  07/16/2016, 8:53 AM

## 2016-07-16 NOTE — Progress Notes (Signed)
CRITICAL VALUE ALERT  Critical value received:  Potassium 2.7   Date of notification: 07/16/16  Time of notification: 0401  Critical value read back: Yes  Nurse who received alert: Odis Hollingshead   MD notified (1st page): Notified Elink RN Kathlee Nations   Time of first page:  [phone]   MD notified (2nd page):  Time of second page:  Responding MD: Warren Lacy RN Kathlee Nations   Time MD responded: [phone]

## 2016-07-16 NOTE — Progress Notes (Signed)
3 Days Post-Op  Subjective: On vent  Objective: Vital signs in last 24 hours: Temp:  [98 F (36.7 C)-99.7 F (37.6 C)] 98 F (36.7 C) (03/18 0759) Pulse Rate:  [72-90] 90 (03/18 0700) Resp:  [18-26] 26 (03/18 0700) BP: (96-119)/(42-60) 113/46 (03/18 0400) SpO2:  [92 %-99 %] 97 % (03/18 0700) Arterial Line BP: (92-123)/(43-79) 110/52 (03/18 0700) FiO2 (%):  [40 %-50 %] 40 % (03/18 0400) Weight:  [91.1 kg (200 lb 13.4 oz)] 91.1 kg (200 lb 13.4 oz) (03/18 0500) Last BM Date: 07/12/16  Intake/Output from previous day: 03/17 0701 - 03/18 0700 In: 2558 [I.V.:1868; NG/GT:120; IV Piggyback:570] Out: 2570 [Urine:1980; Chest Tube:590] Intake/Output this shift: Total I/O In: 250 [IV Piggyback:250] Out: -   Exam: Intubated but awake Opens eyes Abdomen soft, non distended  Lab Results:   Recent Labs  07/15/16 0930 07/16/16 0310  WBC 12.5* 13.8*  HGB 10.5* 10.4*  HCT 32.9* 31.9*  PLT 53* 36*   BMET  Recent Labs  07/15/16 0500 07/16/16 0310  NA 138 139  K 3.3* 2.7*  CL 94* 94*  CO2 35* 39*  GLUCOSE 250* 130*  BUN 25* 26*  CREATININE 1.04 0.89  CALCIUM 8.1* 8.0*   PT/INR No results for input(s): LABPROT, INR in the last 72 hours. ABG  Recent Labs  07/14/16 1527 07/16/16 0500  PHART 7.471* 7.574*  HCO3 37.5* 37.4*    Studies/Results: Dg Chest Port 1 View  Result Date: 07/16/2016 CLINICAL DATA:  Endotracheal tube placement. EXAM: PORTABLE CHEST 1 VIEW COMPARISON:  07/15/2016 FINDINGS: Endotracheal tube unchanged with tip 4.1 cm above the carina. The visualized portions of the nasogastric tube unchanged. Left basilar chest tube unchanged. Interval placement of right-sided PICC line with tip overlying the SVC. Right basilar chest tube unchanged. Lungs are adequately inflated demonstrate persistent hazy opacification over the mid to lower right lung and left base without significant change. Small bilateral pleural effusions right greater than left unchanged.  Previous noted small bibasilar loculated pneumothoraces more difficult to visualize as there are smaller with more fluid density. Cardiomediastinal silhouette and remainder of the exam is unchanged. IMPRESSION: Stable hazy opacification over the mid to lower right lung and left base. Findings may be due to infection with small bilateral effusions right greater than left. Previously noted small bibasilar loculated pneumothoraces more difficult to visualize as there are smaller and more filled in with pleural fluid. Tubes and lines as described. Electronically Signed   By: Marin Olp M.D.   On: 07/16/2016 07:23   Dg Chest Port 1 View  Result Date: 07/15/2016 CLINICAL DATA:  Acute respiratory failure. EXAM: PORTABLE CHEST 1 VIEW COMPARISON:  07/14/2016 FINDINGS: Endotracheal tube with tip 4.3 cm above the carina. Visualize nasogastric tube unchanged. Bibasilar chest tubes unchanged. Lungs are adequately inflated with continued improvement in small loculated bibasilar pneumothoraces. Persistent bibasilar opacification likely atelectasis with possible small amount of bilateral pleural fluid. Slight worsening hazy opacification over the right midlung which may be due to atelectasis or infection. Cardiomediastinal silhouette and remainder of the exam is unchanged. IMPRESSION: Slight worsening hazy opacification over the right midlung which may be due to atelectasis or infection. Persistent bibasilar opacification likely atelectasis with possible small bilateral pleural effusions. Bibasilar chest tubes in place with continued interval improvement of small loculated pneumothoraces. Remaining tubes and lines as described. Electronically Signed   By: Marin Olp M.D.   On: 07/15/2016 08:55   Dg Chest Port 1 View  Result Date: 07/14/2016 CLINICAL DATA:  Status post intubation. EXAM: PORTABLE CHEST 1 VIEW COMPARISON:  07/14/2016 at 12:10 p.m. FINDINGS: The endotracheal tube tip projects 3.6 cm above the carina, well  positioned. New nasal/ orogastric tube passes below the diaphragm and below the included field of view, into the stomach. Bilateral lower hemithorax chest tubes are stable. The the home pneumothoraces noted in the lower lungs on the prior study have mildly decreased in size. No other change. IMPRESSION: 1. Well-positioned endotracheal tube. Nasal/orogastric tube passes below the diaphragm into the stomach, but below the included field of view. 2. Mild decrease in the size of the bilateral lower lung zone pneumothoraces. Electronically Signed   By: Lajean Manes M.D.   On: 07/14/2016 13:53   Dg Chest Port 1 View  Result Date: 07/14/2016 CLINICAL DATA:  Status post placement of bilateral chest tubes. EXAM: PORTABLE CHEST 1 VIEW COMPARISON:  07/14/2016 at 4:45 a.m. FINDINGS: Sincerely exam, bilateral lower hemithorax chest tubes have been placed. On the right, a chest tube tip projects inferior to the right hilar structures. On the left, the chest tube tip projects over the mid to lower left mid hemithorax. There has been a significant decrease in pleural fluid. Loculated areas noted along the peripheral right mid to lower lung, bb bordering consolidated, partly collapsed lung. On the left, there is a loculated pneumothorax at the lateral left mid to lower hemithorax. Hazy opacities noted in the partly collapsed lower left lung. There is no apical pneumothorax on either side. IMPRESSION: 1. Status post chest tube placement in both lower hemithoraces with significant decrease in pleural fluid. 2. There are now pneumothoraces in the mid to lower hemithoraces. On the right, this appears loculated. This is more simple in appearance on the left. The partly collapsed lower lungs are densely consolidated on the right with more hazy consolidation on the left, which may be atelectasis, edema or infection, or a combination. Electronically Signed   By: Lajean Manes M.D.   On: 07/14/2016 12:39     Anti-infectives: Anti-infectives    Start     Dose/Rate Route Frequency Ordered Stop   07/15/16 0600  vancomycin (VANCOCIN) 1,250 mg in sodium chloride 0.9 % 250 mL IVPB     1,250 mg 166.7 mL/hr over 90 Minutes Intravenous Every 12 hours 07/14/16 1901     07/15/16 0200  piperacillin-tazobactam (ZOSYN) IVPB 3.375 g     3.375 g 12.5 mL/hr over 240 Minutes Intravenous Every 8 hours 07/14/16 1901     07/14/16 1930  vancomycin (VANCOCIN) 1,500 mg in sodium chloride 0.9 % 500 mL IVPB     1,500 mg 250 mL/hr over 120 Minutes Intravenous  Once 07/14/16 1901 07/14/16 2251   07/14/16 1930  piperacillin-tazobactam (ZOSYN) IVPB 3.375 g     3.375 g 100 mL/hr over 30 Minutes Intravenous  Once 07/14/16 1901 07/14/16 2058      Assessment/Plan: s/p Procedure(s): LAPAROSCOPIC SPLENECTOMY (N/A)  plts down further.  Transfuse only per hem-onc recommendations Will start tube feeds if he does not come off the vent  LOS: 7 days    Larah Kuntzman A 07/16/2016

## 2016-07-16 NOTE — Progress Notes (Signed)
eLink Physician-Brief Progress Note Patient Name: [name] DOB: 01-Feb-1941 MRN: [id number]   Date of Service  07/16/2016  HPI/Events of Note    eICU Interventions  Hypokalemia, repleted      Intervention Category Intermediate Interventions: Electrolyte abnormality - evaluation and management  Simonne Maffucci 07/16/2016, 9:58 PM

## 2016-07-17 ENCOUNTER — Inpatient Hospital Stay (HOSPITAL_COMMUNITY): Payer: Medicare HMO

## 2016-07-17 ENCOUNTER — Encounter (HOSPITAL_COMMUNITY): Payer: Self-pay | Admitting: Radiology

## 2016-07-17 DIAGNOSIS — I480 Paroxysmal atrial fibrillation: Principal | ICD-10-CM

## 2016-07-17 DIAGNOSIS — E43 Unspecified severe protein-calorie malnutrition: Secondary | ICD-10-CM

## 2016-07-17 DIAGNOSIS — J869 Pyothorax without fistula: Secondary | ICD-10-CM

## 2016-07-17 DIAGNOSIS — R29898 Other symptoms and signs involving the musculoskeletal system: Secondary | ICD-10-CM

## 2016-07-17 DIAGNOSIS — D638 Anemia in other chronic diseases classified elsewhere: Secondary | ICD-10-CM

## 2016-07-17 DIAGNOSIS — R601 Generalized edema: Secondary | ICD-10-CM

## 2016-07-17 DIAGNOSIS — I5032 Chronic diastolic (congestive) heart failure: Secondary | ICD-10-CM

## 2016-07-17 DIAGNOSIS — E878 Other disorders of electrolyte and fluid balance, not elsewhere classified: Secondary | ICD-10-CM

## 2016-07-17 DIAGNOSIS — Z9911 Dependence on respirator [ventilator] status: Secondary | ICD-10-CM

## 2016-07-17 LAB — CBC WITH DIFFERENTIAL/PLATELET
BASOS ABS: 0 10*3/uL (ref 0.0–0.1)
Basophils Relative: 0 %
Eosinophils Absolute: 0 10*3/uL (ref 0.0–0.7)
Eosinophils Relative: 0 %
HCT: 27.5 % — ABNORMAL LOW (ref 39.0–52.0)
HEMOGLOBIN: 9 g/dL — AB (ref 13.0–17.0)
LYMPHS PCT: 6 %
Lymphs Abs: 0.9 10*3/uL (ref 0.7–4.0)
MCH: 27.9 pg (ref 26.0–34.0)
MCHC: 32.7 g/dL (ref 30.0–36.0)
MCV: 85.1 fL (ref 78.0–100.0)
MONO ABS: 0.6 10*3/uL (ref 0.1–1.0)
Monocytes Relative: 4 %
NEUTROS ABS: 13.1 10*3/uL — AB (ref 1.7–7.7)
Neutrophils Relative %: 90 %
Platelets: 58 10*3/uL — ABNORMAL LOW (ref 150–400)
RBC: 3.23 MIL/uL — AB (ref 4.22–5.81)
RDW: 16.4 % — ABNORMAL HIGH (ref 11.5–15.5)
WBC: 14.5 10*3/uL — ABNORMAL HIGH (ref 4.0–10.5)

## 2016-07-17 LAB — GLUCOSE, CAPILLARY
GLUCOSE-CAPILLARY: 204 mg/dL — AB (ref 65–99)
GLUCOSE-CAPILLARY: 214 mg/dL — AB (ref 65–99)
GLUCOSE-CAPILLARY: 269 mg/dL — AB (ref 65–99)
GLUCOSE-CAPILLARY: 332 mg/dL — AB (ref 65–99)
GLUCOSE-CAPILLARY: 338 mg/dL — AB (ref 65–99)
Glucose-Capillary: 224 mg/dL — ABNORMAL HIGH (ref 65–99)

## 2016-07-17 LAB — BPAM PLATELET PHERESIS
BLOOD PRODUCT EXPIRATION DATE: 201803182359
ISSUE DATE / TIME: 201803181014
UNIT TYPE AND RH: 5100

## 2016-07-17 LAB — BODY FLUID CULTURE: Gram Stain: NONE SEEN

## 2016-07-17 LAB — BASIC METABOLIC PANEL
Anion gap: 8 (ref 5–15)
Anion gap: 7 (ref 5–15)
BUN: 24 mg/dL — AB (ref 6–20)
BUN: 25 mg/dL — AB (ref 6–20)
CALCIUM: 8.1 mg/dL — AB (ref 8.9–10.3)
CALCIUM: 8.1 mg/dL — AB (ref 8.9–10.3)
CO2: 35 mmol/L — AB (ref 22–32)
CO2: 34 mmol/L — AB (ref 22–32)
Chloride: 93 mmol/L — ABNORMAL LOW (ref 101–111)
Chloride: 95 mmol/L — ABNORMAL LOW (ref 101–111)
Creatinine, Ser: 0.77 mg/dL (ref 0.61–1.24)
Creatinine, Ser: 0.76 mg/dL (ref 0.61–1.24)
GFR calc Af Amer: >60 mL/min (ref >60)
GFR calc non Af Amer: >60 mL/min (ref >60)
GFR calc non Af Amer: >60 mL/min (ref >60)
GLUCOSE: 210 mg/dL — AB (ref 65–99)
GLUCOSE: 264 mg/dL — AB (ref 65–99)
Potassium: 3.2 mmol/L — ABNORMAL LOW (ref 3.5–5.1)
Potassium: 4 mmol/L (ref 3.5–5.1)
Sodium: 136 mmol/L (ref 135–145)
Sodium: 136 mmol/L (ref 135–145)

## 2016-07-17 LAB — COMPREHENSIVE METABOLIC PANEL
ALBUMIN: 2.4 g/dL — AB (ref 3.5–5.0)
ALT: 12 U/L — AB (ref 17–63)
AST: 18 U/L (ref 15–41)
Alkaline Phosphatase: 59 U/L (ref 38–126)
Anion gap: 8 (ref 5–15)
BILIRUBIN TOTAL: 0.9 mg/dL (ref 0.3–1.2)
BUN: 22 mg/dL — AB (ref 6–20)
CO2: 36 mmol/L — ABNORMAL HIGH (ref 22–32)
CREATININE: 0.8 mg/dL (ref 0.61–1.24)
Calcium: 7.9 mg/dL — ABNORMAL LOW (ref 8.9–10.3)
Chloride: 93 mmol/L — ABNORMAL LOW (ref 101–111)
GFR calc Af Amer: >60 mL/min (ref >60)
GLUCOSE: 237 mg/dL — AB (ref 65–99)
Potassium: 2.9 mmol/L — ABNORMAL LOW (ref 3.5–5.1)
Sodium: 137 mmol/L (ref 135–145)
TOTAL PROTEIN: 5.3 g/dL — AB (ref 6.5–8.1)

## 2016-07-17 LAB — BLOOD GAS, ARTERIAL
ACID-BASE EXCESS: 13.9 mmol/L — AB (ref 0.0–2.0)
Bicarbonate: 37.6 mmol/L — ABNORMAL HIGH (ref 20.0–28.0)
DRAWN BY: 11249
FIO2: 40
MECHVT: 600 mL
O2 Saturation: 98.2 %
PATIENT TEMPERATURE: 98.6
PEEP/CPAP: 5 cmH2O
PH ART: 7.576 — AB (ref 7.350–7.450)
PO2 ART: 96.7 mmHg (ref 83.0–108.0)
RATE: 18 resp/min
pCO2 arterial: 40.3 mmHg (ref 32.0–48.0)

## 2016-07-17 LAB — PREPARE PLATELET PHERESIS: Unit division: 0

## 2016-07-17 LAB — MAGNESIUM
MAGNESIUM: 1.8 mg/dL (ref 1.7–2.4)
Magnesium: 1.8 mg/dL (ref 1.7–2.4)
Magnesium: 2.5 mg/dL — ABNORMAL HIGH (ref 1.7–2.4)

## 2016-07-17 LAB — PHOSPHORUS
Phosphorus: 2.6 mg/dL (ref 2.5–4.6)
Phosphorus: 2.9 mg/dL (ref 2.5–4.6)

## 2016-07-17 MED ORDER — ALBUMIN HUMAN 25 % IV SOLN
25.0000 g | Freq: Four times a day (QID) | INTRAVENOUS | Status: AC
  Administered 2016-07-17 – 2016-07-18 (×4): 25 g via INTRAVENOUS
  Filled 2016-07-17 (×4): qty 100

## 2016-07-17 MED ORDER — VITAL 1.5 CAL PO LIQD
1000.0000 mL | ORAL | Status: DC
Start: 2016-07-17 — End: 2016-07-20
  Administered 2016-07-17 – 2016-07-19 (×3): 1000 mL
  Filled 2016-07-17 (×4): qty 1000

## 2016-07-17 MED ORDER — FUROSEMIDE 10 MG/ML IJ SOLN
8.0000 mg/h | INTRAVENOUS | Status: DC
  Administered 2016-07-17 – 2016-07-18 (×2): 8 mg/h via INTRAVENOUS
  Filled 2016-07-17 (×3): qty 25

## 2016-07-17 MED ORDER — POTASSIUM CHLORIDE 10 MEQ/100ML IV SOLN
10.0000 meq | INTRAVENOUS | Status: AC
Start: 2016-07-17 — End: 2016-07-17
  Administered 2016-07-17 (×3): 10 meq via INTRAVENOUS
  Filled 2016-07-17 (×4): qty 100

## 2016-07-17 MED ORDER — ADULT MULTIVITAMIN LIQUID CH
15.0000 mL | Freq: Every day | ORAL | Status: DC
  Administered 2016-07-17 – 2016-07-20 (×4): 15 mL
  Filled 2016-07-17 (×4): qty 15

## 2016-07-17 MED ORDER — PRO-STAT SUGAR FREE PO LIQD
30.0000 mL | Freq: Two times a day (BID) | ORAL | Status: DC
  Administered 2016-07-17: 30 mL
  Filled 2016-07-17: qty 30

## 2016-07-17 MED ORDER — MAGNESIUM SULFATE 4 GM/100ML IV SOLN
4.0000 g | Freq: Once | INTRAVENOUS | Status: AC
  Administered 2016-07-17: 4 g via INTRAVENOUS
  Filled 2016-07-17: qty 100

## 2016-07-17 MED ORDER — SODIUM CHLORIDE 0.9 % IV SOLN: 30.0000 meq | INTRAVENOUS | Status: DC

## 2016-07-17 MED ORDER — POTASSIUM CHLORIDE 20 MEQ/15ML (10%) PO SOLN
40.0000 meq | ORAL | Status: AC
  Administered 2016-07-17 – 2016-07-18 (×6): 40 meq
  Filled 2016-07-17 (×6): qty 30

## 2016-07-17 MED ORDER — VITAL HIGH PROTEIN PO LIQD
1000.0000 mL | ORAL | Status: DC
  Filled 2016-07-17: qty 1000

## 2016-07-17 MED ORDER — PRO-STAT SUGAR FREE PO LIQD
60.0000 mL | Freq: Two times a day (BID) | ORAL | Status: DC
  Administered 2016-07-17 – 2016-07-20 (×6): 60 mL
  Filled 2016-07-17 (×6): qty 60

## 2016-07-17 NOTE — Assessment & Plan Note (Addendum)
Plan

## 2016-07-17 NOTE — Progress Notes (Signed)
[facility] ADULT ICU REPLACEMENT PROTOCOL FOR AM LAB REPLACEMENT ONLY  The patient does apply for the [facility] Adult ICU Electrolyte Replacment Protocol based on the criteria listed below:   1. Is GFR >/= 40 ml/min? Yes.    Patient's GFR today is >60 2. Is urine output >/= 0.5 ml/kg/hr for the last 6 hours? Yes.   Patient's UOP is 1.4 ml/kg/hr 3. Is BUN < 60 mg/dL? Yes.    Patient's BUN today is 22 4. Abnormal electrolyte(s): K+2.9 5. Ordered repletion with: Protocol 6. If a panic level lab has been reported, has the CCM MD in charge been notified? Yes.  .   Physician:  Arlester Marker, Eddie Dibbles Hilliard 07/17/2016 5:02 AM

## 2016-07-17 NOTE — Progress Notes (Signed)
Nutrition Follow-up  DOCUMENTATION CODES:   Not applicable  INTERVENTION:  - Will order 60 mL Prostat BID and Osmolite 1.5 @ 20 mL/hr advance by 10 mL every 12 hours to reach goal rate of 40 mL/hr. At goal rate, this regimen will provide 1840 kcal, 125 grams of protein, and 733 mL free water.  - Slow advancement given hypokalemia and no BM since 3/14 (day before surgery) - Will order 15 mL liquid multivitamin per OGT.   NUTRITION DIAGNOSIS:   Inadequate oral intake related to inability to eat as evidenced by NPO status. -ongoing  GOAL:   Patient will meet greater than or equal to 90% of their needs -unmet at this time  MONITOR:   Vent status, TF tolerance, Weight trends, Labs, I & O's  REASON FOR ASSESSMENT:   Consult Enteral/tube feeding initiation and management  ASSESSMENT:   76 y.o. male with medical history significant of hypertension, hyperlipidemia, diabetes mellitus, COPD, GERD, idiopathic thrombocytopenia, bilateral pleural effusion, bronchiectasis, PSVT, dCHF, who presents with shortness of breath and bilateral leg edema.  3/19 Consult for TF received. OGT in place. Talked with pt and wife, who is at bedside, and explained RD role and explained TF as pt indicates that he is feeling nervous about what it entails. Pt is able to communicate with head nods/shakes and writing. He denies nausea or any abdominal pain, except when coughing. CBW used in estimating needs but lean body weight likely lower given moderate edema to BUE and severe edema to BLE. Physical assessment does not show muscle or fat wasting at this time but may be masked by edema. Wife reports BLE edema going on for "awhile" PTA and that pt was being followed by outpatient MD for this.   Patient is currently intubated on ventilator support MV: 10.4 L/min Temp (24hrs), Avg:98 F (36.7 C), Min:97.4 F (36.3 C), Max:98.5 F (36.9 C) Propofol: none BP: 102/46 and MAP: 65  Medications reviewed; 25 mg  albumin x4 doses today, 20 mg IV Lasix x3 doses yesterday, sliding scale Novolog, 50 mg IV Solu-Cortef QID, 4 g IV Mg sulfate x1 run today, 40 mg IV Protonix BID, 10 mEq IV KCl x3 runs today, 40 mEq KCl per OGT x1 dose yesterday and x6 doses starting today. Labs reviewed; K: 2.9 mmol/L, Mg and Phos WDL today, Cl: 93 mmol/L, BUN: 22 mg/dL, Ca: 7.9 mg/dL.  Drips: Levo @ 2 mcg/min, Amiodarone @ 30 mg/hr, Lasix @ 8 mg/hr.    3/16 - Weight slightly up since admission and admission weight (91.2 kg) used in estimating needs.  - Pt POD #1 lap splenectomy.  - He had thoracentesis this AM with 600cc thick, old blood-like drainage.  - Pt was intubated ~1 hour ago and OGT placed at that time.  - Unable to obtain PTA information and did not perform physical assessment at this time.  - Per chart review, pt previously on Heart Healthy/Carb Modified diet and consumed 95% of all meals 3/13 and 60% of dinner 3/14.  - Per review, it appears he lost 10 lbs (4.7% body weight) from 2/6-3/5 which is not significant for time frame.  Patient is currently intubated on ventilator support MV: 12.7 L/min Temp (24hrs), Avg:97.7 F (36.5 C), Min:93.5 F (34.2 C), Max:99.6 F (37.6 C) Propofol: none BP: 124/75 and MAP: 89 Drip: Amiodarone @ 30 mg/hr    Diet Order:  Diet NPO time specified  Skin:  Wound (see comment) (Abdominal incision from lap splenectomy 07/13/16)  Last BM:  3/14  Height:   Ht Readings from Last 1 Encounters:  07/14/16 5\' 11"  (1.803 m)    Weight:   Wt Readings from Last 1 Encounters:  07/17/16 196 lb 3.4 oz (89 kg)    Ideal Body Weight:  78.18 kg  BMI:  Body mass index is 27.37 kg/m.  Estimated Nutritional Needs:   Kcal:  2595  Protein:  107-133 grams (1.2-1.5 grams/kg)  Fluid:  per MD/NP given edema and need for Lasix drip  EDUCATION NEEDS:   No education needs identified at this time    Stephen Matin, MS, RD, LDN, CNSC Inpatient Clinical Dietitian Pager #  [phone] After hours/weekend pager # [phone]

## 2016-07-17 NOTE — Assessment & Plan Note (Addendum)
plan

## 2016-07-17 NOTE — Progress Notes (Signed)
Inpatient Diabetes Program Recommendations  AACE/ADA: New Consensus Statement on Inpatient Glycemic Control (2015)  Target Ranges:  Prepandial:   less than 140 mg/dL      Peak postprandial:   less than 180 mg/dL (1-2 hours)      Critically ill patients:  140 - 180 mg/dL   Results for JAMIER, URBAS (MRN [id number]) as of 07/17/2016 11:52  Ref. Range 07/16/2016 00:35 07/16/2016 03:14 07/16/2016 07:57 07/16/2016 11:34 07/16/2016 16:17 07/16/2016 18:52  Glucose-Capillary Latest Ref Range: 65 - 99 mg/dL 318 (H) 118 (H) 188 (H) 227 (H) 224 (H) 221 (H)   Results for ANDRIK, SANDT (MRN [id number]) as of 07/17/2016 11:52  Ref. Range 07/16/2016 23:23 07/17/2016 03:27 07/17/2016 07:41  Glucose-Capillary Latest Ref Range: 65 - 99 mg/dL 240 (H) 214 (H) 204 (H)    Home DM Meds: Toujeo 30 units daily       Metformin 1000 mg BID  Current Insulin Orders: Novolog 0-5 units Q4 hours (No coverage until CBG 201 mg/dl)      MD- Note patient currently Intubated.  To start continuous tube feedings today.  Also getting Solucortef 50 mg Q6 hours.  Started steroids yesterday at 11am.  CBGs consistently >200 mg/dl since initiation of steroids.  Patient takes Toujeo basal insulin at home as well.  Please consider the following:  1. Start Lantus 15 units daily (50% home dose of basal insulin)  2. Change Novolog SSI to Novolog Sensitive Correction Scale/ SSI (0-9 units) Q4 hours so that patient will get Novolog coverage before CBG reaches 201 mg/dl      --Will follow patient during hospitalization--  Wyn Quaker RN, MSN, CDE Diabetes Coordinator Inpatient Glycemic Control Team Team Pager: [phone] (8a-5p)

## 2016-07-17 NOTE — Progress Notes (Signed)
Date:  July 17, 2016 Chart reviewed for concurrent status and case management needs. Will continue to follow patient progress.  On vent for resp.support/ a.line for pressures/ Discharge Planning: following for needs Expected discharge date: [id number] Velva Harman, BSN, [place], [place]

## 2016-07-17 NOTE — Progress Notes (Signed)
4 Days Post-Op  Subjective: Doing a little better.  On a lasix gtt and has bilateral chest tubes.  Tolerating tube feeds.    Objective: Vital signs in last 24 hours: Temp:  [97.8 F (36.6 C)-98.5 F (36.9 C)] 97.9 F (36.6 C) (03/19 1600) Pulse Rate:  [65-80] 72 (03/19 1800) Resp:  [18-37] 37 (03/19 1800) BP: (91-121)/(41-59) 104/58 (03/19 1800) SpO2:  [96 %-100 %] 100 % (03/19 1514) Arterial Line BP: (88-131)/(39-79) 95/52 (03/19 1800) FiO2 (%):  [30 %-40 %] 30 % (03/19 1514) Weight:  [89 kg (196 lb 3.4 oz)] 89 kg (196 lb 3.4 oz) (03/19 0100) Last BM Date: 07/12/16  Intake/Output from previous day: 03/18 0701 - 03/19 0700 In: 2581.3 [I.V.:866.3; Blood:270; NG/GT:120; IV Piggyback:1325] Out: 3580 [Urine:3310; Chest Tube:270] Intake/Output this shift: Total I/O In: 718.6 [I.V.:258.6; NG/GT:110; IV Piggyback:350] Out: 1150 [Urine:950; Chest Tube:200]  Exam: Intubated but awake Opens eyes Abdomen soft, non distended  Lab Results:   Recent Labs  07/16/16 0310 07/17/16 0329  WBC 13.8* 14.5*  HGB 10.4* 9.0*  HCT 31.9* 27.5*  PLT 36* 58*   BMET  Recent Labs  07/17/16 1054 07/17/16 1612  NA 136 136  K 3.2* 4.0  CL 93* 95*  CO2 35* 34*  GLUCOSE 210* 264*  BUN 24* 25*  CREATININE 0.77 0.76  CALCIUM 8.1* 8.1*   PT/INR No results for input(s): LABPROT, INR in the last 72 hours. ABG  Recent Labs  07/16/16 0500 07/17/16 0500  PHART 7.574* 7.576*  HCO3 37.4* 37.6*    Studies/Results: Ct Chest Wo Contrast  Result Date: 07/17/2016 CLINICAL DATA:  Right pleural effusion. EXAM: CT CHEST WITHOUT CONTRAST TECHNIQUE: Multidetector CT imaging of the chest was performed following the standard protocol without IV contrast. COMPARISON:  06/09/2016 FINDINGS: Cardiovascular: Heart is normal size. Coronary artery calcifications diffusely. Scattered aortic calcifications. No evidence of aortic aneurysm. Mediastinum/Nodes: No mediastinal, hilar, or axillary adenopathy.  Endotracheal tube tip is in the mid trachea. Lungs/Pleura: There are moderate bilateral pleural effusions. Bilateral chest tubes are in place. Locules of pleural care noted bilaterally. The locules of gas are not fall anterior in location compatible with multiple loculations bilaterally, right worse than left. Airspace disease noted in the lower lobes bilaterally. Ground-glass nodular opacities throughout the right upper lung and to a lesser extent lingula. Findings concerning for pneumonia. Upper Abdomen: NG tube is in the stomach. No acute findings in the upper abdomen. Musculoskeletal: Chest wall soft tissues are unremarkable. No acute bony abnormality. IMPRESSION: Partially loculated moderate bilateral hydropneumothoraces with bilateral chest tubes in place. Bilateral lower lobe airspace opacities. Ground-glass nodular opacities in the right upper lobe and lingula. Findings concerning for pneumonia. Coronary artery disease. Electronically Signed   By: Rolm Baptise M.D.   On: 07/17/2016 14:39   Dg Chest Port 1 View  Result Date: 07/17/2016 CLINICAL DATA:  Endotracheal tube check EXAM: PORTABLE CHEST 1 VIEW COMPARISON:  07/16/2016 chest radiograph FINDINGS: Endotracheal tube tip is in unchanged position just below the level of the clavicles and approximately 4.5 cm above the inferior margin of the carina. Right-sided PICC line tip is at the cavoatrial junction. There bilateral chest tubes in unchanged position. There are small bilateral pleural effusions and associated atelectasis. No visualized pneumothorax. IMPRESSION: 1. Unchanged support apparatus. 2. Small bilateral pleural effusions and associated atelectasis. Electronically Signed   By: Ulyses Jarred M.D.   On: 07/17/2016 05:28   Dg Chest Port 1 View  Result Date: 07/16/2016 CLINICAL DATA:  Endotracheal  tube placement. EXAM: PORTABLE CHEST 1 VIEW COMPARISON:  07/15/2016 FINDINGS: Endotracheal tube unchanged with tip 4.1 cm above the carina. The  visualized portions of the nasogastric tube unchanged. Left basilar chest tube unchanged. Interval placement of right-sided PICC line with tip overlying the SVC. Right basilar chest tube unchanged. Lungs are adequately inflated demonstrate persistent hazy opacification over the mid to lower right lung and left base without significant change. Small bilateral pleural effusions right greater than left unchanged. Previous noted small bibasilar loculated pneumothoraces more difficult to visualize as there are smaller with more fluid density. Cardiomediastinal silhouette and remainder of the exam is unchanged. IMPRESSION: Stable hazy opacification over the mid to lower right lung and left base. Findings may be due to infection with small bilateral effusions right greater than left. Previously noted small bibasilar loculated pneumothoraces more difficult to visualize as there are smaller and more filled in with pleural fluid. Tubes and lines as described. Electronically Signed   By: Marin Olp M.D.   On: 07/16/2016 07:23    Anti-infectives: Anti-infectives    Start     Dose/Rate Route Frequency Ordered Stop   07/16/16 1200  cefTAZidime (FORTAZ) 2 g in dextrose 5 % 50 mL IVPB     2 g 100 mL/hr over 30 Minutes Intravenous Every 8 hours 07/16/16 0942     07/15/16 0600  vancomycin (VANCOCIN) 1,250 mg in sodium chloride 0.9 % 250 mL IVPB  Status:  Discontinued     1,250 mg 166.7 mL/hr over 90 Minutes Intravenous Every 12 hours 07/14/16 1901 07/16/16 0942   07/15/16 0200  piperacillin-tazobactam (ZOSYN) IVPB 3.375 g  Status:  Discontinued     3.375 g 12.5 mL/hr over 240 Minutes Intravenous Every 8 hours 07/14/16 1901 07/16/16 0942   07/14/16 1930  vancomycin (VANCOCIN) 1,500 mg in sodium chloride 0.9 % 500 mL IVPB     1,500 mg 250 mL/hr over 120 Minutes Intravenous  Once 07/14/16 1901 07/14/16 2251   07/14/16 1930  piperacillin-tazobactam (ZOSYN) IVPB 3.375 g     3.375 g 100 mL/hr over 30 Minutes  Intravenous  Once 07/14/16 1901 07/14/16 2058      Assessment/Plan: s/p Procedure(s): LAPAROSCOPIC SPLENECTOMY (N/A)  Tube feeds to goal. Suppository tomorrow if no BM. Path c/w ITP.   LOS: 8 days    [facility] 07/17/2016

## 2016-07-17 NOTE — Progress Notes (Signed)
PULMONARY / CRITICAL CARE MEDICINE   Name: [name] MRN: [id number] DOB: Jun 30, 1940    ADMISSION DATE:  07/08/2016 CONSULTATION DATE: 3/15  REFERRING MD:  Barry Dienes   CHIEF COMPLAINT:  Vent management    SUBJECTIVE:  No events overnight  VITAL SIGNS: BP (!) 103/51 (BP Location: Left Arm)   Pulse 69   Temp 97.8 F (36.6 C) (Axillary)   Resp 19   Ht 5\' 11"  (1.803 m)   Wt 196 lb 3.4 oz (89 kg)   SpO2 99%   BMI 27.37 kg/m  100% NRB HEMODYNAMICS: CVP:  [10 mmHg-12 mmHg] 10 mmHg  VENTILATOR SETTINGS: Vent Mode: PRVC FiO2 (%):  [40 %] 40 % Set Rate:  [18 bmp] 18 bmp Vt Set:  [600 mL] 600 mL PEEP:  [5 cmH20] 5 cmH20 Plateau Pressure:  [25 cmH20-29 cmH20] 29 cmH20  INTAKE / OUTPUT: I/O last 3 completed shifts: In: 3673.4 [I.V.:1618.4; Blood:270; NG/GT:240; IV [id number]:0932] Out: 6712 [WPYKD:9833; Chest Tube:520]   PHYSICAL EXAMINATION: General appearance:  76 Year old  Male, NAD,  Conversant, massively volume overloaded Eyes: anicteric sclerae, moist conjunctivae; PERRL, EOMI bilaterally. Mouth:  membranes and no mucosal ulcerations; normal hard and soft palate, orally intubated Lungs/chest: scattered rhonchi, with normal respiratory effort and no intercostal retractions CTs w/ now clearing pleural fluid. Much less bloody w/ output dropping. Right CT w/ intermittent airleak CV: regular irreg, no MRGs  Abdomen: Soft, non-tender; no masses or HSM Extremities: massive anasarca Skin: Normal temperature, turgor and texture; no rash, ulcers or subcutaneous nodules Neuro/Psych: Appropriate affect, alert and oriented to person, place and time  LABS:  BMET  Recent Labs Lab 07/16/16 0310 07/16/16 2005 07/17/16 0329  NA 139 138 137  K 2.7* 3.1* 2.9*  CL 94* 93* 93*  CO2 39* 37* 36*  BUN 26* 24* 22*  CREATININE 0.89 0.96 0.80  GLUCOSE 130* 241* 237*    Electrolytes  Recent Labs Lab 07/14/16 0522  07/16/16 0310 07/16/16 2005 07/17/16 0329  CALCIUM 8.2*  <  > 8.0* 8.0* 7.9*  MG 1.6*  < > 1.5* 2.0 1.8  PHOS 4.5  --  1.7*  --  2.6  < > = values in this interval not displayed. CBC  Recent Labs Lab 07/15/16 0930 07/16/16 0310 07/17/16 0329  WBC 12.5* 13.8* 14.5*  HGB 10.5* 10.4* 9.0*  HCT 32.9* 31.9* 27.5*  PLT 53* 36* 58*   Coag's No results for input(s): APTT, INR in the last 168 hours. Sepsis Markers No results for input(s): LATICACIDVEN, PROCALCITON, O2SATVEN in the last 168 hours.  ABG  Recent Labs Lab 07/14/16 1527 07/16/16 0500 07/17/16 0500  PHART 7.471* 7.574* 7.576*  PCO2ART 52.1* 40.5 40.3  PO2ART 95.0 72.8* 96.7   Liver Enzymes  Recent Labs Lab 07/14/16 0522 07/16/16 0310 07/17/16 0329  AST 28 16 18   ALT 13* 9* 12*  ALKPHOS 71 66 59  BILITOT 0.7 0.8 0.9  ALBUMIN 2.6* 1.7* 2.4*   Cardiac Enzymes No results for input(s): TROPONINI, PROBNP in the last 168 hours. Glucose  Recent Labs Lab 07/16/16 1134 07/16/16 1617 07/16/16 1852 07/16/16 2323 07/17/16 0327 07/17/16 0741  GLUCAP 227* 224* 221* 240* 214* 204*   Imaging Dg Chest Port 1 View  Result Date: 07/17/2016 CLINICAL DATA:  Endotracheal tube check EXAM: PORTABLE CHEST 1 VIEW COMPARISON:  07/16/2016 chest radiograph FINDINGS: Endotracheal tube tip is in unchanged position just below the level of the clavicles and approximately 4.5 cm above the inferior margin of the carina.  Right-sided PICC line tip is at the cavoatrial junction. There bilateral chest tubes in unchanged position. There are small bilateral pleural effusions and associated atelectasis. No visualized pneumothorax. IMPRESSION: 1. Unchanged support apparatus. 2. Small bilateral pleural effusions and associated atelectasis. Electronically Signed   By: Ulyses Jarred M.D.   On: 07/17/2016 05:28     STUDIES/events Splenectomy 3/15.  3/15 self extubaetd Right thora 3/16.Marland Kitchen 800 clearly exudate. Appears like old blood Pleural cytology 3/16>>> 3/16 bilateral chest tubes placed d/t old  bloody appearing effusions w/ significant fibrinous debris noted on [place].  3/19:Pleural fluid grew Pseudomonas therefore this was EMPYEMA. CXR showing fluid has now filled loculated spaces where had loculated PTX after tube placement. Massive Anasarca. Now weaning some. Starting lasix gtt. CT chest.    CULTURES: Pleural fluid 3/16>>>Pseudomonas (pan sens) Blood cultures 3/16>>>  ANTIBIOTICS: vanc 3/16>>> Zosyn 3/16>>>3/18 Ceftaz 3/18>>>  SIGNIFICANT EVENTS: Left thora 1.2 blood tinged fluid   LINES/TUBES: OETT 3/15>>>3/16 (self extubated) ETT 3/16>>> Bilateral Chest tubes 3/16 R PICC 3/17>>>  ASSESSMENT / PLAN:  Acute on Chronic respiratory failure in setting of 1) Pseudomonas empyema (confirmed on right; presumed on left), superimposed on top of underlying 2) COPD, 3) BTX and 4) massive volume overload.  -weaning some now but massively volume overloaded.  Plan PSV as tolerated Start lasix gtt Cont BDs Day 2/14 ceftaz Keep bilateral chest tubes to 20 cm H2O sxn CT chest. 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 Heme says will take several days to see PLT improve. Got PLTs yesterday  Anemia of critical illness. No evidence of further bleeding. 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  Start lasix gtt Cont albumin  Levophed for MAP > 65  Pain  Acute encephalopathy appears resolved.  Plan Cont PAD protocol  Sepsis +pseudomonas Empyema  -still w/ sig leukocytosis  Prob HCAP vs BTX Flare Plan Day 2/14 fortaz  See Pulm section re: empyema   Metabolic alkalosis. Presume that this may be to some extent d/t  chronic resp acidosis  Plan Decrease resting Ve to 12 f/u abg in am   Severe hypokalemia  Plan Replace and recheck  DM w/ hyperglycemia  Plan SSI  Severe protein calorie malnutrition  Plan Start tube feeds.    DVT prophylaxis: SCD SUP: PPI Diet: start tubefeeds 3/19 Activity: bedrest Disposition : ICU  Summary Making progress. Looks like the answer to his pleural pathology is Empyema. Probably had either HCAP or BTX flare. He looks much better but is MASSIVELY volume overloaded d/t poor protein stores. For today major interventions: -lasix gtt and albumin  -start nutrition  -wean some -CT chest to evaluate pleural space-->once PLTs stable we will do TPA and DNase in the chest to deal w/ loculated areas -aggressively replace K  Updated family.  My critical care time 60 minutes.   Erick Colace ACNP-BC [place] Pager # [phone] OR # [phone] if no answer    07/17/2016, 8:40 AM

## 2016-07-17 NOTE — Progress Notes (Signed)
Progress Note  Patient Name: Chaise Mahabir Date of Encounter: 07/17/2016  Primary Cardiologist: Dr Cyril Mourning  Subjective   Remains intubated on vent, but awake  Inpatient Medications    Scheduled Meds: . albumin human  25 g Intravenous Q6H  . amiodarone  150 mg Intravenous Once  . atorvastatin  40 mg Per Tube q1800  . budesonide (PULMICORT) nebulizer solution  0.25 mg Nebulization BID  . cefTAZidime (FORTAZ)  IV  2 g Intravenous Q8H  . chlorhexidine gluconate (MEDLINE KIT)  15 mL Mouth Rinse BID  . Chlorhexidine Gluconate Cloth  6 each Topical Daily  . feeding supplement (PRO-STAT SUGAR FREE 64)  60 mL Per Tube BID  . feeding supplement (VITAL 1.5 CAL)  1,000 mL Per Tube Q24H  . hydrocortisone sodium succinate  50 mg Intravenous Q6H  . insulin aspart  0-5 Units Subcutaneous Q4H  . ipratropium  0.5 mg Nebulization Q6H  . levalbuterol  1.25 mg Nebulization Q6H  . magnesium sulfate 1 - 4 g bolus IVPB  4 g Intravenous Once  . mouth rinse  15 mL Mouth Rinse QID  . multivitamin  15 mL Per Tube Daily  . pantoprazole (PROTONIX) IV  40 mg Intravenous Q12H  . potassium chloride  40 mEq Per Tube Q4H  . sodium chloride flush  10-40 mL Intracatheter Q12H   Continuous Infusions: . amiodarone 30 mg/hr (07/17/16 1100)  . furosemide (LASIX) infusion 8 mg/hr (07/17/16 1100)  . norepinephrine (LEVOPHED) Adult infusion 2.027 mcg/min (07/17/16 1100)   PRN Meds: acetaminophen, fentaNYL (SUBLIMAZE) injection, levalbuterol, sodium chloride flush   Vital Signs    Vitals:   07/17/16 0846 07/17/16 1100 07/17/16 1200 07/17/16 1204  BP: (!) 108/41   (!) 95/46  Pulse: 73 69  72  Resp: (!) 29 (!) 22  (!) 30  Temp:   97.9 F (36.6 C)   TempSrc:   Axillary   SpO2: 99% 99%  99%  Weight:      Height:        Intake/Output Summary (Last 24 hours) at 07/17/16 1245 Last data filed at 07/17/16 1100  Gross per 24 hour  Intake           1373.2 ml  Output             3320 ml  Net           -1946.8 ml   Filed Weights   07/16/16 0500 07/16/16 2240 07/17/16 0100  Weight: 200 lb 13.4 oz (91.1 kg) 196 lb 3.4 oz (89 kg) 196 lb 3.4 oz (89 kg)    Telemetry    SR - Personally Reviewed  ECG    N/A - Personally Reviewed  Physical Exam   General: WM on vent, but awake. Head: Normocephalic, atraumatic.  Neck: Supple without bruits, + JVD. Lungs:  Resp regular and unlabored, Diminished bilaterally, diffuse rhonchi. Heart: RRR, S1, S2, no S3, S4, or murmur; no rub. Abdomen: Soft, non-tender, non-distended with normoactive bowel sounds. No hepatomegaly. No rebound/guarding. No obvious abdominal masses. Extremities: No clubbing, cyanosis, diffuse anasarca. Distal pedal pulses are 2+ bilaterally. Neuro: Alert and oriented. Psych: blunted  Labs    Chemistry Recent Labs Lab 07/14/16 0522  07/16/16 0310 07/16/16 2005 07/17/16 0329  NA 138  < > 139 138 137  K 4.2  < > 2.7* 3.1* 2.9*  CL 93*  < > 94* 93* 93*  CO2 36*  < > 39* 37* 36*  GLUCOSE 212*  < > 130*  241* 237*  BUN 18  < > 26* 24* 22*  CREATININE 0.91  < > 0.89 0.96 0.80  CALCIUM 8.2*  < > 8.0* 8.0* 7.9*  PROT 6.2*  --  4.9*  --  5.3*  ALBUMIN 2.6*  --  1.7*  --  2.4*  AST 28  --  16  --  18  ALT 13*  --  9*  --  12*  ALKPHOS 71  --  66  --  59  BILITOT 0.7  --  0.8  --  0.9  GFRNONAA >60  < > >60 >60 >60  GFRAA >60  < > >60 >60 >60  ANIONGAP 9  < > '6 8 8  '$ < > = values in this interval not displayed.   Hematology Recent Labs Lab 07/15/16 0930 07/16/16 0310 07/17/16 0329  WBC 12.5* 13.8* 14.5*  RBC 3.82* 3.80* 3.23*  HGB 10.5* 10.4* 9.0*  HCT 32.9* 31.9* 27.5*  MCV 86.1 83.9 85.1  MCH 27.5 27.4 27.9  MCHC 31.9 32.6 32.7  RDW 16.4* 16.3* 16.4*  PLT 53* 36* 58*    Cardiac EnzymesNo results for input(s): TROPONINI in the last 168 hours. No results for input(s): TROPIPOC in the last 168 hours.   BNP Recent Labs Lab 07/15/16 0900  BNP 95.6     DDimer No results for input(s): DDIMER in the  last 168 hours.    Radiology    Dg Chest Port 1 View  Result Date: 07/17/2016 CLINICAL DATA:  Endotracheal tube check EXAM: PORTABLE CHEST 1 VIEW COMPARISON:  07/16/2016 chest radiograph FINDINGS: Endotracheal tube tip is in unchanged position just below the level of the clavicles and approximately 4.5 cm above the inferior margin of the carina. Right-sided PICC line tip is at the cavoatrial junction. There bilateral chest tubes in unchanged position. There are small bilateral pleural effusions and associated atelectasis. No visualized pneumothorax. IMPRESSION: 1. Unchanged support apparatus. 2. Small bilateral pleural effusions and associated atelectasis. Electronically Signed   By: Ulyses Jarred M.D.   On: 07/17/2016 05:28   Dg Chest Port 1 View  Result Date: 07/16/2016 CLINICAL DATA:  Endotracheal tube placement. EXAM: PORTABLE CHEST 1 VIEW COMPARISON:  07/15/2016 FINDINGS: Endotracheal tube unchanged with tip 4.1 cm above the carina. The visualized portions of the nasogastric tube unchanged. Left basilar chest tube unchanged. Interval placement of right-sided PICC line with tip overlying the SVC. Right basilar chest tube unchanged. Lungs are adequately inflated demonstrate persistent hazy opacification over the mid to lower right lung and left base without significant change. Small bilateral pleural effusions right greater than left unchanged. Previous noted small bibasilar loculated pneumothoraces more difficult to visualize as there are smaller with more fluid density. Cardiomediastinal silhouette and remainder of the exam is unchanged. IMPRESSION: Stable hazy opacification over the mid to lower right lung and left base. Findings may be due to infection with small bilateral effusions right greater than left. Previously noted small bibasilar loculated pneumothoraces more difficult to visualize as there are smaller and more filled in with pleural fluid. Tubes and lines as described. Electronically  Signed   By: Marin Olp M.D.   On: 07/16/2016 07:23    Cardiac Studies   TTE: 3/11  Study Conclusions  - Left ventricle: The cavity size was normal. Wall thickness was   increased in a pattern of mild LVH. Systolic function was normal.   The estimated ejection fraction was in the range of 60% to 65%.   Although no diagnostic  regional wall motion abnormality was   identified, this possibility cannot be completely excluded on the   basis of this study. Features are consistent with a pseudonormal   left ventricular filling pattern, with concomitant abnormal   relaxation and increased filling pressure (grade 2 diastolic   dysfunction). - Mitral valve: Calcified annulus. Mildly calcified leaflets .   There was trivial regurgitation. - Right ventricle: The cavity size was mildly dilated. - Right atrium: Central venous pressure (est): 3 mm Hg. - Atrial septum: No defect or patent foramen ovale was identified. - Tricuspid valve: There was trivial regurgitation. - Pericardium, extracardiac: There was a right pleural effusion.  Impressions:  - Overall limited images. Mild LVH with LVEF 60-65%. Probable grade   2 diastolic dysfunction. Calcified mitral annulus with trivial   mitral regurgitation. Aortic valve is moderately calcified and   not well seen. Right ventricle appears mildly dilated. Trivial   tricuspid regurgitation. Right pleural effusion is evident.  Patient Profile     76 y.o. 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 Since admission his rate has been difficult to control but has converted to NSR with IV Amiodarone. Not a candidate for anticoagulation with undetectable PTLs and active bleeding requiring transfusion of blood and platelets. He has recurrent bilateral pleural effusions and went for thoracentesis 3/14 in preparation for splenectomy 3/15. The thoracentesis was only  partially successful as the pt developed hypotension and the procedure had to be aborted. The did have his splenectomy 07/14/15 and is for repeat thoracentesis.   Assessment & Plan    1. AF with RVR vs PSVT- he has been maintaining NSR with no further afib or SVT. -- remains on IV amiodarone, and levophed. Coreg held to allow blood pressure room to wean levophed.   2. 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  - platelets still low at 58  3. Blood loss anemia- Hgb dropped from 11.2 to 7. Transfused 07/10/16 and stable at 9.0.   4. Pleural effusions- Recurrent pleural effusions this admission - thoracentesis 07/12/16- partially successful-1.2L removed from Lt but then pt became hypotensive.  - repeat thoracentesis with 1600cc of serosanguinous fluid on left and 900cc on right s/p bilateral chest tubes. Chest tubes remain in place with serosanguinous drainage.   5. Acute on chronic diastolic CHF EF 91-63% with LVH and grade 2 DD - he has marked anasarca that suspect due to 3rd spacing. Albumin 2.4 - he put out 3.5L yesterday, weight trending down. - Cxray from today shows no CHF with small bilateral pleural effusions.   6.  Hypokalemia - replete and keep > 4 per CCM - mg 1.8  Signed, Reino Bellis, NP  07/17/2016, 12:45 PM    I have examined the patient and reviewed assessment and plan and discussed with patient.  Agree with above as stated.  In NSR at this time. Continue IV Amio for now. Anemia stable. Pseudomonas in the pleural fluid noted.  Receiving antibiotics.  The patient is able to write notes to me.  He was concerned about his thyroid.  Free T4 was slightly increased, with TSH increased as well. Will have to watch thyroid function in the setting of amiodarone use.  Larae Grooms

## 2016-07-17 NOTE — Assessment & Plan Note (Signed)
Plan

## 2016-07-18 DIAGNOSIS — G9340 Encephalopathy, unspecified: Secondary | ICD-10-CM

## 2016-07-18 DIAGNOSIS — R6521 Severe sepsis with septic shock: Secondary | ICD-10-CM

## 2016-07-18 DIAGNOSIS — A419 Sepsis, unspecified organism: Secondary | ICD-10-CM

## 2016-07-18 LAB — CBC WITH DIFFERENTIAL/PLATELET
BASOS PCT: 0 %
Basophils Absolute: 0 10*3/uL (ref 0.0–0.1)
EOS PCT: 0 %
Eosinophils Absolute: 0 10*3/uL (ref 0.0–0.7)
HEMATOCRIT: 28.3 % — AB (ref 39.0–52.0)
Hemoglobin: 8.9 g/dL — ABNORMAL LOW (ref 13.0–17.0)
Lymphocytes Relative: 1 %
Lymphs Abs: 0.2 10*3/uL — ABNORMAL LOW (ref 0.7–4.0)
MCH: 26.6 pg (ref 26.0–34.0)
MCHC: 31.4 g/dL (ref 30.0–36.0)
MCV: 84.7 fL (ref 78.0–100.0)
MONO ABS: 0.6 10*3/uL (ref 0.1–1.0)
MONOS PCT: 5 %
Neutro Abs: 12.4 10*3/uL — ABNORMAL HIGH (ref 1.7–7.7)
Neutrophils Relative %: 94 %
PLATELETS: 105 10*3/uL — AB (ref 150–400)
RBC: 3.34 MIL/uL — ABNORMAL LOW (ref 4.22–5.81)
RDW: 16.4 % — AB (ref 11.5–15.5)
WBC: 13.2 10*3/uL — ABNORMAL HIGH (ref 4.0–10.5)

## 2016-07-18 LAB — GLUCOSE, CAPILLARY
GLUCOSE-CAPILLARY: 212 mg/dL — AB (ref 65–99)
GLUCOSE-CAPILLARY: 345 mg/dL — AB (ref 65–99)
GLUCOSE-CAPILLARY: 396 mg/dL — AB (ref 65–99)
Glucose-Capillary: 361 mg/dL — ABNORMAL HIGH (ref 65–99)
Glucose-Capillary: 332 mg/dL — ABNORMAL HIGH (ref 65–99)
Glucose-Capillary: 242 mg/dL — ABNORMAL HIGH (ref 65–99)

## 2016-07-18 LAB — BASIC METABOLIC PANEL
ANION GAP: 6 (ref 5–15)
Anion gap: 7 (ref 5–15)
BUN: 28 mg/dL — AB (ref 6–20)
BUN: 29 mg/dL — ABNORMAL HIGH (ref 6–20)
CALCIUM: 8.4 mg/dL — AB (ref 8.9–10.3)
CHLORIDE: 96 mmol/L — AB (ref 101–111)
CO2: 37 mmol/L — ABNORMAL HIGH (ref 22–32)
CO2: 38 mmol/L — ABNORMAL HIGH (ref 22–32)
CREATININE: 0.91 mg/dL (ref 0.61–1.24)
Calcium: 8.6 mg/dL — ABNORMAL LOW (ref 8.9–10.3)
Chloride: 97 mmol/L — ABNORMAL LOW (ref 101–111)
Creatinine, Ser: 0.86 mg/dL (ref 0.61–1.24)
GFR calc Af Amer: >60 mL/min (ref >60)
GLUCOSE: 426 mg/dL — AB (ref 65–99)
Glucose, Bld: 349 mg/dL — ABNORMAL HIGH (ref 65–99)
POTASSIUM: 3.6 mmol/L (ref 3.5–5.1)
POTASSIUM: 3.4 mmol/L — AB (ref 3.5–5.1)
SODIUM: 140 mmol/L (ref 135–145)
SODIUM: 141 mmol/L (ref 135–145)

## 2016-07-18 LAB — MAGNESIUM
MAGNESIUM: 2.2 mg/dL (ref 1.7–2.4)
MAGNESIUM: 1.9 mg/dL (ref 1.7–2.4)
Magnesium: 2 mg/dL (ref 1.7–2.4)
Magnesium: 2.1 mg/dL (ref 1.7–2.4)

## 2016-07-18 LAB — PHOSPHORUS
PHOSPHORUS: 1.7 mg/dL — AB (ref 2.5–4.6)
Phosphorus: 2.9 mg/dL (ref 2.5–4.6)

## 2016-07-18 LAB — CREATININE, SERUM: CREATININE: 0.87 mg/dL (ref 0.61–1.24)

## 2016-07-18 MED ORDER — QUETIAPINE FUMARATE 50 MG PO TABS
25.0000 mg | ORAL_TABLET | Freq: Two times a day (BID) | ORAL | Status: DC
  Administered 2016-07-18 (×2): 25 mg
  Filled 2016-07-18 (×2): qty 1

## 2016-07-18 MED ORDER — DORNASE ALFA 2.5 MG/2.5ML IN SOLN
5.0000 mg | Freq: Every day | RESPIRATORY_TRACT | Status: AC
Start: 2016-07-18 — End: 2016-07-20
  Administered 2016-07-18 – 2016-07-20 (×3): 5 mg via INTRAPLEURAL
  Filled 2016-07-18 (×4): qty 5

## 2016-07-18 MED ORDER — HYDROCORTISONE NA SUCCINATE PF 100 MG IJ SOLR
50.0000 mg | Freq: Two times a day (BID) | INTRAMUSCULAR | Status: DC
Start: 2016-07-18 — End: 2016-07-19
  Administered 2016-07-18 – 2016-07-19 (×2): 50 mg via INTRAVENOUS
  Filled 2016-07-18 (×2): qty 2

## 2016-07-18 MED ORDER — STERILE WATER FOR INJECTION IJ SOLN
5.0000 mg | Freq: Every day | RESPIRATORY_TRACT | Status: AC
  Administered 2016-07-18 – 2016-07-20 (×3): 5 mg via INTRAPLEURAL
  Filled 2016-07-18 (×4): qty 5

## 2016-07-18 MED ORDER — DEXMEDETOMIDINE HCL IN NACL 200 MCG/50ML IV SOLN
0.0000 ug/kg/h | INTRAVENOUS | Status: DC
  Administered 2016-07-18: 1.2 ug/kg/h via INTRAVENOUS
  Administered 2016-07-18: 0.4 ug/kg/h via INTRAVENOUS
  Administered 2016-07-19: 0.5 ug/kg/h via INTRAVENOUS
  Filled 2016-07-18 (×7): qty 50

## 2016-07-18 MED ORDER — MAGNESIUM SULFATE 2 GM/50ML IV SOLN
2.0000 g | Freq: Once | INTRAVENOUS | Status: AC
  Administered 2016-07-18: 2 g via INTRAVENOUS
  Filled 2016-07-18: qty 50

## 2016-07-18 MED ORDER — MIDAZOLAM HCL 2 MG/2ML IJ SOLN: 2.0000 mg | Freq: Once | INTRAMUSCULAR | Status: AC

## 2016-07-18 MED ORDER — FENTANYL CITRATE (PF) 100 MCG/2ML IJ SOLN
50.0000 ug | INTRAMUSCULAR | Status: DC | PRN
  Administered 2016-07-18: 50 ug via INTRAVENOUS

## 2016-07-18 MED ORDER — ALBUMIN HUMAN 25 % IV SOLN
25.0000 g | Freq: Four times a day (QID) | INTRAVENOUS | Status: AC
  Administered 2016-07-18 – 2016-07-19 (×4): 25 g via INTRAVENOUS
  Filled 2016-07-18 (×3): qty 50

## 2016-07-18 MED ORDER — SODIUM CHLORIDE 0.9 % IJ SOLN
10.0000 mg | Freq: Every day | INTRAMUSCULAR | Status: AC
  Administered 2016-07-18 – 2016-07-20 (×3): 10 mg via INTRAPLEURAL
  Filled 2016-07-18 (×4): qty 10

## 2016-07-18 MED ORDER — SODIUM PHOSPHATES 45 MMOLE/15ML IV SOLN
30.0000 mmol | Freq: Once | INTRAVENOUS | Status: AC
  Administered 2016-07-18: 30 mmol via INTRAVENOUS
  Filled 2016-07-18: qty 10

## 2016-07-18 MED ORDER — BISACODYL 10 MG RE SUPP: 10.0000 mg | Freq: Every day | RECTAL | Status: DC | PRN

## 2016-07-18 MED ORDER — FENTANYL CITRATE (PF) 100 MCG/2ML IJ SOLN
INTRAMUSCULAR | Status: AC
  Administered 2016-07-18: 100 ug
  Filled 2016-07-18: qty 2

## 2016-07-18 MED ORDER — FENTANYL CITRATE (PF) 100 MCG/2ML IJ SOLN
50.0000 ug | INTRAMUSCULAR | Status: DC | PRN
  Administered 2016-07-19 – 2016-07-20 (×2): 50 ug via INTRAVENOUS
  Filled 2016-07-18 (×2): qty 2

## 2016-07-18 MED ORDER — INSULIN ASPART 100 UNIT/ML ~~LOC~~ SOLN
5.0000 [IU] | SUBCUTANEOUS | Status: DC
Start: 2016-07-18 — End: 2016-07-19
  Administered 2016-07-18 – 2016-07-19 (×6): 5 [IU] via SUBCUTANEOUS

## 2016-07-18 MED ORDER — MIDAZOLAM HCL 2 MG/2ML IJ SOLN
INTRAMUSCULAR | Status: AC
  Administered 2016-07-18: 2 mg
  Filled 2016-07-18: qty 2

## 2016-07-18 MED ORDER — BISACODYL 10 MG RE SUPP
10.0000 mg | Freq: Once | RECTAL | Status: AC
  Administered 2016-07-18: 10 mg via RECTAL
  Filled 2016-07-18: qty 1

## 2016-07-18 MED ORDER — POTASSIUM CHLORIDE 20 MEQ/15ML (10%) PO SOLN
40.0000 meq | ORAL | Status: AC
  Administered 2016-07-18 – 2016-07-19 (×5): 40 meq
  Filled 2016-07-18 (×5): qty 30

## 2016-07-18 NOTE — Progress Notes (Signed)
5 Days Post-Op  Subjective: Continues to tolerate tube feeds.  Still on ventilator.    Objective: Vital signs in last 24 hours: Temp:  [97.6 F (36.4 C)-98.4 F (36.9 C)] 97.6 F (36.4 C) (03/20 0343) Pulse Rate:  [65-92] 92 (03/20 0800) Resp:  [15-37] 22 (03/20 0800) BP: (95-136)/(44-80) 122/45 (03/20 0800) SpO2:  [96 %-100 %] 97 % (03/20 0800) Arterial Line BP: (77-123)/(40-76) 106/42 (03/20 0800) FiO2 (%):  [30 %-40 %] 30 % (03/20 0800) Weight:  [87.7 kg (193 lb 5.5 oz)] 87.7 kg (193 lb 5.5 oz) (03/20 0100) Last BM Date: 07/12/16  Intake/Output from previous day: 03/19 0701 - 03/20 0700 In: 1995.3 [I.V.:755.3; NG/GT:590; IV Piggyback:650] Out: 4200 [Urine:3850; Chest Tube:350] Intake/Output this shift: Total I/O In: 96 [I.V.:16; NG/GT:80] Out: 300 [Urine:300]  Exam: Intubated but awake Opens eyes Abdomen soft, non distended  Lab Results:   Recent Labs  07/17/16 0329 07/18/16 0344  WBC 14.5* 13.2*  HGB 9.0* 8.9*  HCT 27.5* 28.3*  PLT 58* 105*   BMET  Recent Labs  07/17/16 1612 07/18/16 0025 07/18/16 0344  NA 136 140  --   K 4.0 3.6  --   CL 95* 97*  --   CO2 34* 37*  --   GLUCOSE 264* 349*  --   BUN 25* 29*  --   CREATININE 0.76 0.86 0.87  CALCIUM 8.1* 8.4*  --    PT/INR No results for input(s): LABPROT, INR in the last 72 hours. ABG  Recent Labs  07/16/16 0500 07/17/16 0500  PHART 7.574* 7.576*  HCO3 37.4* 37.6*    Studies/Results: Ct Chest Wo Contrast  Result Date: 07/17/2016 CLINICAL DATA:  Right pleural effusion. EXAM: CT CHEST WITHOUT CONTRAST TECHNIQUE: Multidetector CT imaging of the chest was performed following the standard protocol without IV contrast. COMPARISON:  06/09/2016 FINDINGS: Cardiovascular: Heart is normal size. Coronary artery calcifications diffusely. Scattered aortic calcifications. No evidence of aortic aneurysm. Mediastinum/Nodes: No mediastinal, hilar, or axillary adenopathy. Endotracheal tube tip is in the mid  trachea. Lungs/Pleura: There are moderate bilateral pleural effusions. Bilateral chest tubes are in place. Locules of pleural care noted bilaterally. The locules of gas are not fall anterior in location compatible with multiple loculations bilaterally, right worse than left. Airspace disease noted in the lower lobes bilaterally. Ground-glass nodular opacities throughout the right upper lung and to a lesser extent lingula. Findings concerning for pneumonia. Upper Abdomen: NG tube is in the stomach. No acute findings in the upper abdomen. Musculoskeletal: Chest wall soft tissues are unremarkable. No acute bony abnormality. IMPRESSION: Partially loculated moderate bilateral hydropneumothoraces with bilateral chest tubes in place. Bilateral lower lobe airspace opacities. Ground-glass nodular opacities in the right upper lobe and lingula. Findings concerning for pneumonia. Coronary artery disease. Electronically Signed   By: Rolm Baptise M.D.   On: 07/17/2016 14:39   Dg Chest Port 1 View  Result Date: 07/17/2016 CLINICAL DATA:  Endotracheal tube check EXAM: PORTABLE CHEST 1 VIEW COMPARISON:  07/16/2016 chest radiograph FINDINGS: Endotracheal tube tip is in unchanged position just below the level of the clavicles and approximately 4.5 cm above the inferior margin of the carina. Right-sided PICC line tip is at the cavoatrial junction. There bilateral chest tubes in unchanged position. There are small bilateral pleural effusions and associated atelectasis. No visualized pneumothorax. IMPRESSION: 1. Unchanged support apparatus. 2. Small bilateral pleural effusions and associated atelectasis. Electronically Signed   By: Ulyses Jarred M.D.   On: 07/17/2016 05:28    Anti-infectives: Anti-infectives  Start     Dose/Rate Route Frequency Ordered Stop   07/16/16 1200  cefTAZidime (FORTAZ) 2 g in dextrose 5 % 50 mL IVPB     2 g 100 mL/hr over 30 Minutes Intravenous Every 8 hours 07/16/16 0942     07/15/16 0600   vancomycin (VANCOCIN) 1,250 mg in sodium chloride 0.9 % 250 mL IVPB  Status:  Discontinued     1,250 mg 166.7 mL/hr over 90 Minutes Intravenous Every 12 hours 07/14/16 1901 07/16/16 0942   07/15/16 0200  piperacillin-tazobactam (ZOSYN) IVPB 3.375 g  Status:  Discontinued     3.375 g 12.5 mL/hr over 240 Minutes Intravenous Every 8 hours 07/14/16 1901 07/16/16 0942   07/14/16 1930  vancomycin (VANCOCIN) 1,500 mg in sodium chloride 0.9 % 500 mL IVPB     1,500 mg 250 mL/hr over 120 Minutes Intravenous  Once 07/14/16 1901 07/14/16 2251   07/14/16 1930  piperacillin-tazobactam (ZOSYN) IVPB 3.375 g     3.375 g 100 mL/hr over 30 Minutes Intravenous  Once 07/14/16 1901 07/14/16 2058      Assessment/Plan: s/p Procedure(s): LAPAROSCOPIC SPLENECTOMY (N/A)  Suppository for constipation. PCCM continuing to manage vent/effusions. ITP:  Platelets finally coming up.  Did not receive platelets yesterday.     LOS: 9 days    [name] 07/18/2016

## 2016-07-18 NOTE — Progress Notes (Signed)
HEMATOLOGY-ONCOLOGY PROGRESS NOTE  SUBJECTIVE: Patient is on the ventilator, awake and alert and answering questions appropriately. He is communicating by writing on a letter pad.  OBJECTIVE: REVIEW OF SYSTEMS:   Not obtained  PHYSICAL EXAMINATION: ECOG PERFORMANCE STATUS: 4 - Bedbound  Vitals:   07/18/16 1154 07/18/16 1200  BP: (!) 110/43 (!) 113/48  Pulse: 78 78  Resp: (!) 34 (!) 31  Temp:  98.2 F (36.8 C)   Filed Weights   07/16/16 2240 07/17/16 0100 07/18/16 0100  Weight: 196 lb 3.4 oz (89 kg) 196 lb 3.4 oz (89 kg) 193 lb 5.5 oz (87.7 kg)    GENERAL:alert, no distress, awake on ventilator Nasogastric tube in place Endotracheal tube in place SKIN: skin color, texture, turgor are normal, no rashes or significant lesions LUNGS: Diminished breath sounds in the bases HEART: regular rate & rhythm and no murmurs and 3+ lower extremity edema ABDOMEN:abdomen soft, non-tender Musculoskeletal:no cyanosis of digits and no clubbing  NEURO: Answers questions appropriately, moves all extremities well.  LABORATORY DATA:  I have reviewed the data as listed CMP Latest Ref Rng & Units 07/18/2016 07/18/2016 07/18/2016  Glucose 65 - 99 mg/dL 426(H) - 349(H)  BUN 6 - 20 mg/dL 28(H) - 29(H)  Creatinine 0.61 - 1.24 mg/dL 0.91 0.87 0.86  Sodium 135 - 145 mmol/L 141 - 140  Potassium 3.5 - 5.1 mmol/L 3.4(L) - 3.6  Chloride 101 - 111 mmol/L 96(L) - 97(L)  CO2 22 - 32 mmol/L 38(H) - 37(H)  Calcium 8.9 - 10.3 mg/dL 8.6(L) - 8.4(L)  Total Protein 6.5 - 8.1 g/dL - - -  Total Bilirubin 0.3 - 1.2 mg/dL - - -  Alkaline Phos 38 - 126 U/L - - -  AST 15 - 41 U/L - - -  ALT 17 - 63 U/L - - -    Lab Results  Component Value Date   WBC 13.2 (H) 07/18/2016   HGB 8.9 (L) 07/18/2016   HCT 28.3 (L) 07/18/2016   MCV 84.7 07/18/2016   PLT 105 (L) 07/18/2016   NEUTROABS 12.4 (H) 07/18/2016    ASSESSMENT AND PLAN: 1. Acute ITP: Relapsed refractory status post splenectomy today's platelet count is  105. 2. severe empyema with bilateral pleural effusions requiring chest tube placement, TPA is being planned to break the septi so that his empyema can be drained. 3. Severe COPD 4. On broad-spectrum antibiotics for Pseudomonas  Please call Charlestine Massed, our NP if any hematology issues arise when I am away until next Monday.

## 2016-07-18 NOTE — Progress Notes (Signed)
PULMONARY / CRITICAL CARE MEDICINE   Name: [name] MRN: [id number] DOB: [birth year]    ADMISSION DATE:  07/08/2016 CONSULTATION DATE: 3/15  REFERRING MD:  Barry Dienes   CHIEF COMPLAINT:  Vent management    SUBJECTIVE:    VITAL SIGNS: BP (!) 122/45 (BP Location: Left Arm)   Pulse 92   Temp 97.6 F (36.4 C) (Axillary)   Resp (!) 22   Ht 5\' 11"  (1.803 m)   Wt 193 lb 5.5 oz (87.7 kg)   SpO2 97%   BMI 26.97 kg/m  100% NRB HEMODYNAMICS: CVP:  [9 mmHg-13 mmHg] 9 mmHg  VENTILATOR SETTINGS: Vent Mode: PSV;CPAP FiO2 (%):  [30 %-40 %] 30 % Set Rate:  [12 bmp] 12 bmp Vt Set:  [600 mL] 600 mL PEEP:  [5 cmH20] 5 cmH20 Pressure Support:  [10 cmH20-12 cmH20] 10 cmH20 Plateau Pressure:  [27 cmH20-28 cmH20] 27 cmH20  INTAKE / OUTPUT:  Intake/Output Summary (Last 24 hours) at 07/18/16 0940 Last data filed at 07/18/16 6270  Gross per 24 hour  Intake          2043.15 ml  Output             4725 ml  Net         -2681.85 ml      General appearance:  76Year old  Male, NAD, currently in acute distress, confused today Eyes: anicteric sclerae, moist conjunctivae; PERRL, EOMI bilaterally. Mouth:  membranes and no mucosal ulcerations; normal hard and soft palate, orally intubated  Neck: Trachea midline; neck supple, no JVD Lungs/chest: scattered rhonchi.  with normal respiratory effort and no intercostal retractions. No air leak in bilateral CTs. Output now more serous  CV: Reg irreg, no MRGs  Abdomen: Soft, non-tender; no masses or HSM Extremities:anasarca improved some  Skin: Normal temperature, turgor and texture; no rash, ulcers or subcutaneous nodules Neuro/Psych: slightly paranoid thoughts today, alert and oriented to person, place and time  LABS:  BMET  Recent Labs Lab 07/17/16 1054 07/17/16 1612 07/18/16 0025 07/18/16 0344  NA 136 136 140  --   K 3.2* 4.0 3.6  --   CL 93* 95* 97*  --   CO2 35* 34* 37*  --   BUN 24* 25* 29*  --   CREATININE 0.77 0.76 0.86 0.87   GLUCOSE 210* 264* 349*  --     Electrolytes  Recent Labs Lab 07/17/16 0329 07/17/16 1054 07/17/16 1612 07/18/16 0025 07/18/16 0344  CALCIUM 7.9* 8.1* 8.1* 8.4*  --   MG 1.8 1.8 2.5* 2.2 2.0  PHOS 2.6 2.9  --   --  1.7*   CBC  Recent Labs Lab 07/16/16 0310 07/17/16 0329 07/18/16 0344  WBC 13.8* 14.5* 13.2*  HGB 10.4* 9.0* 8.9*  HCT 31.9* 27.5* 28.3*  PLT 36* 58* 105*   Coag's No results for input(s): APTT, INR in the last 168 hours. Sepsis Markers No results for input(s): LATICACIDVEN, PROCALCITON, O2SATVEN in the last 168 hours.  ABG  Recent Labs Lab 07/14/16 1527 07/16/16 0500 07/17/16 0500  PHART 7.471* 7.574* 7.576*  PCO2ART 52.1* 40.5 40.3  PO2ART 95.0 72.8* 96.7   Liver Enzymes  Recent Labs Lab 07/14/16 0522 07/16/16 0310 07/17/16 0329  AST 28 16 18   ALT 13* 9* 12*  ALKPHOS 71 66 59  BILITOT 0.7 0.8 0.9  ALBUMIN 2.6* 1.7* 2.4*   Cardiac Enzymes No results for input(s): TROPONINI, PROBNP in the last 168 hours. Glucose  Recent Labs Lab 07/17/16 1159 07/17/16  1600 07/17/16 1956 07/17/16 2342 07/18/16 0339 07/18/16 0840  GLUCAP 224* 269* 332* 338* 361* 396*   Imaging Ct Chest Wo Contrast  Result Date: 07/17/2016 CLINICAL DATA:  Right pleural effusion. EXAM: CT CHEST WITHOUT CONTRAST TECHNIQUE: Multidetector CT imaging of the chest was performed following the standard protocol without IV contrast. COMPARISON:  06/09/2016 FINDINGS: Cardiovascular: Heart is normal size. Coronary artery calcifications diffusely. Scattered aortic calcifications. No evidence of aortic aneurysm. Mediastinum/Nodes: No mediastinal, hilar, or axillary adenopathy. Endotracheal tube tip is in the mid trachea. Lungs/Pleura: There are moderate bilateral pleural effusions. Bilateral chest tubes are in place. Locules of pleural care noted bilaterally. The locules of gas are not fall anterior in location compatible with multiple loculations bilaterally, right worse than  left. Airspace disease noted in the lower lobes bilaterally. Ground-glass nodular opacities throughout the right upper lung and to a lesser extent lingula. Findings concerning for pneumonia. Upper Abdomen: NG tube is in the stomach. No acute findings in the upper abdomen. Musculoskeletal: Chest wall soft tissues are unremarkable. No acute bony abnormality. IMPRESSION: Partially loculated moderate bilateral hydropneumothoraces with bilateral chest tubes in place. Bilateral lower lobe airspace opacities. Ground-glass nodular opacities in the right upper lobe and lingula. Findings concerning for pneumonia. Coronary artery disease. Electronically Signed   By: Rolm Baptise M.D.   On: 07/17/2016 14:39     STUDIES/events Splenectomy 3/15.  3/15 self extubaetd Right thora 3/16.Marland Kitchen 800 clearly exudate. Appears like old blood Pleural cytology 3/16>>> 3/16 bilateral chest tubes placed d/t old bloody appearing effusions w/ significant fibrinous debris noted on [place].  3/19:Pleural fluid grew Pseudomonas therefore this was EMPYEMA. CXR showing fluid has now filled loculated spaces where had loculated PTX after tube placement. Massive Anasarca. Now weaning some. Starting lasix gtt. CT chest.  CT chest 3/19: Partially loculated moderate bilateral hydropneumothoraces with bilateral chest tubes in place. Bilateral lower lobe airspace opacities. Ground-glass nodular opacities in the right upper lobe and lingula. Findings concerning for pneumonia. 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  Plan Trend PLTs Cont  SCDs but if stays >100K for another 24 hrs will start [place] heparin Transfuse for PLT<50K  Ventilator dependence (HCC) d/t Empyema, and volume overload. Complicated further by baseline COPD and Bronchiectasis  -weaning today -neg 2.6 liters on lasix gtt Plan PSV as tolerated Cont lasix gtt.  Cont BDs Not ready for extubation   Septic shock-->now resolved Plan Wean steroids to off    Empyema [facility]) w/ multiple bilateral loculations by CT scan 3/19 -pseudomonas (on the right and presumed on left as well) -NOT a candidate for VATS Plan Day 3/14 Ceftaz TPA/DNAse x3d Repeat daily CXR Keep CTs at 20 cmH2O  Acute encephalopathy -thinks he "died twice last night" and says "I'd dead now!" Plan Start low dose Seroquel  Paroxysmal SVT (supraventricular tachycardia) ([place]) -now w/ CVR Plan amio per cards  Chronic diastolic CHF (congestive heart failure) ([place])  Anasarca -holding coreg as was hyptensive Plan Cont lasix gtt  Disorders of fluid, electrolyte, and acid-base balance -hypophosphatemia  Plan Replace and recheck as needed  Anemia in acute on chronic illness Plan Trend cbc Transfuse per protocol  Diabetes mellitus without  complication ([place]) Plan Add basal dose insulin  Cont ssi  GERD (gastroesophageal reflux disease) Plan Cont PPI  Severe protein-calorie malnutrition (HCC) Plan Cont tube feeds  Severe muscle deconditioning Plan Will need extensive rehab and PT   Discussion Weaning today. We got about 2.6 liters off w/ lasix gtt. His platelets are rising and above 100K! CT imaging reviewed. He has multiple bilateral loculations. Of concern he is encephalopathic today. States that he died twice last night and is convinced of this.  For today -wean on PSV as tolerated but not ready for extubation -TPA/DNase bilateral chest tubes day 1/3 -cont abx -cont lasix gtt -start low dose Seroquel   - I updated his family fully.   My critical care time 48  minutes.   Erick Colace ACNP-BC [place] Pager # [phone] OR # [phone] if no answer   07/18/2016, 9:28 AM

## 2016-07-18 NOTE — Progress Notes (Signed)
Afternoon rounds.  Delirium worse.  Plan Added precedex.   Gave his first dose of TPA and DNase will unclamp at 1600, cont sxn and re-eval PCXR in am    Erick Colace ACNP-BC [place] Pager # [phone] OR # [phone] if no answer

## 2016-07-18 NOTE — Progress Notes (Signed)
Nutrition Follow-up  DOCUMENTATION CODES:   Not applicable  INTERVENTION:  - Continue Vital 1.5 @ 40 mL/hr with 60 mL Prostat BID. This regimen is providing 200 grams of carbohydrate. - Will initiate PEPuP protocol. - Continue to monitor magnesium, potassium, and phosphorus daily following repletion today.   NUTRITION DIAGNOSIS:   Inadequate oral intake related to inability to eat as evidenced by NPO status. -ongoing  GOAL:   Patient will meet greater than or equal to 90% of their needs -met with current TF regimen  MONITOR:   Vent status, TF tolerance, Weight trends, Labs, I & O's  ASSESSMENT:   76 y.o. male with medical history significant of hypertension, hyperlipidemia, diabetes mellitus, COPD, GERD, idiopathic thrombocytopenia, bilateral pleural effusion, bronchiectasis, PSVT, dCHF, who presents with shortness of breath and bilateral leg edema.  3/20 Pt remains intubated with OGT in place with pt receiving TF at goal rate: Vital 1.5 @ 40 mL/hr with 60 mL Prostat BID (RD accidentally wrote Osmolite 1.5 in note yesterday but intended for Vital 1.5 which RD did order). This regimen is providing 1840 kcal, 125 grams of protein, and 733 mL free water. Weight -1.3 kg from yesterday; CBW used in re-estimating kcal need but protein need from yesterday maintained at this time. Per PCCM note this AM, vent weaning today, septic shock now resolved.  Patient is currently intubated on ventilator support MV: 11 L/min Temp (24hrs), Avg:97.9 F (36.6 C), Min:97.6 F (36.4 C), Max:98.4 F (36.9 C) BP: 125/51 and MAP: 77  Medications reviewed; 25 mg albumin QID, 50 mg IV Solu-Crotef BID, sliding scale Novolog, 2 g IV Mg sulfate x1 dose today, 15 mL liquid multivitamin per OGT/day, 40 mg IV Protonix BID, 40 mEq KCl x6 doses per OGT today, 30 mmol IV Na Phos x1 dose today. Labs reviewed; CBGs: 361 and 396 mg/dL this AM, K: 3.4 mmol/L, Phos: 1.7 mg/dL, Mg: WDL, Cl: 96 mmol/L, BUN: 28 mg/dL,  Ca: 8.6 mg/dL.  Drips: Amiodarone @ 30 mg/hr, Lasix @ 8 mg/hr.     3/19 - Talked with pt and wife, who is at bedside, and explained RD role and explained TF as pt indicates that he is feeling nervous about what it entails.  - Pt is able to communicate with head nods/shakes and writing.  - He denies nausea or any abdominal pain, except when coughing.  - CBW used in estimating needs but lean body weight likely lower given moderate edema to BUE and severe edema to BLE.  - Physical assessment does not show muscle or fat wasting at this time but may be masked by edema.  - Wife reports BLE edema going on for "awhile" PTA and that pt was being followed by outpatient MD for this.   Patient is currently intubated on ventilator support MV: 10.4 L/min Temp (24hrs), Avg:98 F (36.7 C), Min:97.4 F (36.3 C), Max:98.5 F (36.9 C)  BP: 102/46 and MAP: 65 K: 2.9 mmol/L, Mg and Phos WDL today Drips: Levo @ 2 mcg/min, Amiodarone @ 30 mg/hr, Lasix @ 8 mg/hr.    3/16 - Weight slightly up since admission and admission weight (91.2 kg) used in estimating needs.  - Pt POD #1 lap splenectomy.  - He had thoracentesis this AM with 600cc thick, old blood-like drainage.  - Pt was intubated ~1 hour ago and OGT placed at that time.  - Unable to obtain PTA information and did not perform physical assessment at this time.  - Per chart review, pt previously on  Heart Healthy/Carb Modified diet and consumed 95% of all meals 3/13 and 60% of dinner 3/14.  - Per review, it appears he lost 10 lbs (4.7% body weight) from 2/6-3/5 which is not significant for time frame.  Patient is currently intubated on ventilator support MV: 12.7L/min Temp (24hrs), Avg:97.7 F (36.5 C), Min:93.5 F (34.2 C), Max:99.6 F (37.6 C) Propofol: none BP: 124/75 and MAP: 89 Drip: Amiodarone @ 30 mg/hr    Diet Order:  Diet NPO time specified  Skin:  Wound (see comment) (Abdominal incision from lap splenectomy  07/13/16)  Last BM:  3/14  Height:   Ht Readings from Last 1 Encounters:  07/14/16 '5\' 11"'$  (1.803 m)    Weight:   Wt Readings from Last 1 Encounters:  07/18/16 193 lb 5.5 oz (87.7 kg)    Ideal Body Weight:  78.18 kg  BMI:  Body mass index is 26.97 kg/m.  Estimated Nutritional Needs:   Kcal:  1870  Protein:  107-133 grams (1.2-1.5 grams/kg)  Fluid:  per MD/NP given edema and need for Lasix drip  EDUCATION NEEDS:   No education needs identified at this time    Jarome Matin, MS, RD, LDN, CNSC Inpatient Clinical Dietitian Pager # [phone] After hours/weekend pager # [phone]

## 2016-07-18 NOTE — Progress Notes (Signed)
Inpatient Diabetes Program Recommendations  AACE/ADA: New Consensus Statement on Inpatient Glycemic Control (2015)  Target Ranges:  Prepandial:   less than 140 mg/dL      Peak postprandial:   less than 180 mg/dL (1-2 hours)      Critically ill patients:  140 - 180 mg/dL   Results for LAIDEN, MILLES (MRN [id number]) as of 07/18/2016 09:44  Ref. Range 07/16/2016 23:23 07/17/2016 03:27 07/17/2016 07:41 07/17/2016 11:59 07/17/2016 16:00 07/17/2016 19:56  Glucose-Capillary Latest Ref Range: 65 - 99 mg/dL 240 (H) 214 (H) 204 (H) 224 (H) 269 (H) 332 (H)   Results for GAR, GLANCE (MRN [id number]) as of 07/18/2016 09:44  Ref. Range 07/17/2016 23:42 07/18/2016 03:39 07/18/2016 08:40  Glucose-Capillary Latest Ref Range: 65 - 99 mg/dL 338 (H) 361 (H) 396 (H)    Home DM Meds: Toujeo 30 units daily                             Metformin 1000 mg BID  Current Insulin Orders: Novolog 0-5 units Q4 hours (No coverage until CBG 201 mg/dl)      MD- Note patient currently Intubated.  To start continuous tube feedings today.  Also getting Solucortef 50 mg Q6 hours.  Started steroids 03/18.  CBGs consistently >200 mg/dl since initiation of steroids.  Patient takes Toujeo basal insulin at home as well.  Please consider the following:  1. Start Lantus 15 units daily (50% home dose of basal insulin)  2. Change Novolog SSI to Novolog Moderate Correction Scale/ SSI (0-15 units) Q4 hours so that patient will get Novolog coverage before CBG reaches 201 mg/dl    --Will follow patient during hospitalization--  Wyn Quaker RN, MSN, CDE Diabetes Coordinator Inpatient Glycemic Control Team Team Pager: [phone] (8a-5p)

## 2016-07-18 NOTE — Progress Notes (Signed)
    Patient maintaining NSR at this time.  No new recs.  Jettie Booze, MD

## 2016-07-19 ENCOUNTER — Inpatient Hospital Stay (HOSPITAL_COMMUNITY): Payer: Medicare HMO

## 2016-07-19 LAB — BASIC METABOLIC PANEL
Anion gap: 10 (ref 5–15)
BUN: 33 mg/dL — ABNORMAL HIGH (ref 6–20)
CALCIUM: 8.7 mg/dL — AB (ref 8.9–10.3)
CHLORIDE: 98 mmol/L — AB (ref 101–111)
CO2: 39 mmol/L — ABNORMAL HIGH (ref 22–32)
CREATININE: 1.01 mg/dL (ref 0.61–1.24)
GFR calc Af Amer: >60 mL/min (ref >60)
GFR calc non Af Amer: >60 mL/min (ref >60)
Glucose, Bld: 320 mg/dL — ABNORMAL HIGH (ref 65–99)
Potassium: 3.1 mmol/L — ABNORMAL LOW (ref 3.5–5.1)
Sodium: 147 mmol/L — ABNORMAL HIGH (ref 135–145)

## 2016-07-19 LAB — CBC WITH DIFFERENTIAL/PLATELET
BASOS ABS: 0 10*3/uL (ref 0.0–0.1)
Basophils Relative: 0 %
EOS ABS: 0 10*3/uL (ref 0.0–0.7)
Eosinophils Relative: 0 %
HEMATOCRIT: 29.6 % — AB (ref 39.0–52.0)
Hemoglobin: 9.3 g/dL — ABNORMAL LOW (ref 13.0–17.0)
LYMPHS ABS: 0.9 10*3/uL (ref 0.7–4.0)
Lymphocytes Relative: 8 %
MCH: 27.4 pg (ref 26.0–34.0)
MCHC: 31.4 g/dL (ref 30.0–36.0)
MCV: 87.1 fL (ref 78.0–100.0)
MONO ABS: 0.7 10*3/uL (ref 0.1–1.0)
Monocytes Relative: 6 %
Neutro Abs: 10.2 10*3/uL — ABNORMAL HIGH (ref 1.7–7.7)
Neutrophils Relative %: 86 %
Platelets: 143 10*3/uL — ABNORMAL LOW (ref 150–400)
RBC: 3.4 MIL/uL — AB (ref 4.22–5.81)
RDW: 16.5 % — ABNORMAL HIGH (ref 11.5–15.5)
WBC: 11.8 10*3/uL — AB (ref 4.0–10.5)

## 2016-07-19 LAB — CULTURE, BLOOD (ROUTINE X 2)
CULTURE: NO GROWTH
CULTURE: NO GROWTH

## 2016-07-19 LAB — GLUCOSE, CAPILLARY
GLUCOSE-CAPILLARY: 164 mg/dL — AB (ref 65–99)
GLUCOSE-CAPILLARY: 175 mg/dL — AB (ref 65–99)
GLUCOSE-CAPILLARY: 276 mg/dL — AB (ref 65–99)
GLUCOSE-CAPILLARY: 277 mg/dL — AB (ref 65–99)
Glucose-Capillary: 344 mg/dL — ABNORMAL HIGH (ref 65–99)
Glucose-Capillary: 232 mg/dL — ABNORMAL HIGH (ref 65–99)

## 2016-07-19 LAB — CHOLESTEROL, BODY FLUID: Cholesterol, Fluid: 84 mg/dL

## 2016-07-19 LAB — ADENOSIDE DEAMINASE, PLEURAL FL: ADENOSIDE DEAMINASE, PLEURAL FL: 4.5 U/L (ref 0.0–9.4)

## 2016-07-19 MED ORDER — POTASSIUM CHLORIDE 20 MEQ/15ML (10%) PO SOLN
40.0000 meq | Freq: Three times a day (TID) | ORAL | Status: AC
Start: 2016-07-19 — End: 2016-07-19
  Administered 2016-07-19 (×3): 40 meq
  Filled 2016-07-19 (×3): qty 30

## 2016-07-19 MED ORDER — ACETAZOLAMIDE SODIUM 500 MG IJ SOLR
250.0000 mg | Freq: Once | INTRAMUSCULAR | Status: AC
  Administered 2016-07-19: 250 mg via INTRAVENOUS
  Filled 2016-07-19: qty 500

## 2016-07-19 MED ORDER — QUETIAPINE FUMARATE 50 MG PO TABS
50.0000 mg | ORAL_TABLET | Freq: Two times a day (BID) | ORAL | Status: DC
  Administered 2016-07-19 – 2016-07-21 (×5): 50 mg
  Filled 2016-07-19 (×5): qty 1

## 2016-07-19 MED ORDER — HEPARIN SODIUM (PORCINE) 5000 UNIT/ML IJ SOLN
5000.0000 [IU] | Freq: Three times a day (TID) | INTRAMUSCULAR | Status: DC
  Administered 2016-07-19 – 2016-07-27 (×24): 5000 [IU] via SUBCUTANEOUS
  Filled 2016-07-19 (×24): qty 1

## 2016-07-19 MED ORDER — HYDROCORTISONE NA SUCCINATE PF 100 MG IJ SOLR
25.0000 mg | Freq: Two times a day (BID) | INTRAMUSCULAR | Status: DC
  Administered 2016-07-19 – 2016-07-20 (×2): 25 mg via INTRAVENOUS
  Filled 2016-07-19 (×2): qty 2

## 2016-07-19 MED ORDER — INSULIN ASPART 100 UNIT/ML ~~LOC~~ SOLN
10.0000 [IU] | SUBCUTANEOUS | Status: DC
  Administered 2016-07-19 – 2016-07-20 (×6): 10 [IU] via SUBCUTANEOUS

## 2016-07-19 MED ORDER — FREE WATER
200.0000 mL | Status: DC
  Administered 2016-07-19 – 2016-07-20 (×6): 200 mL

## 2016-07-19 MED ORDER — PANTOPRAZOLE SODIUM 40 MG PO PACK
40.0000 mg | PACK | Freq: Every day | ORAL | Status: DC
  Administered 2016-07-19 – 2016-07-21 (×3): 40 mg
  Filled 2016-07-19 (×3): qty 20

## 2016-07-19 MED ORDER — MAGNESIUM SULFATE 2 GM/50ML IV SOLN
2.0000 g | Freq: Once | INTRAVENOUS | Status: AC
  Administered 2016-07-19: 2 g via INTRAVENOUS
  Filled 2016-07-19: qty 50

## 2016-07-19 MED ORDER — ALBUMIN HUMAN 25 % IV SOLN
25.0000 g | Freq: Four times a day (QID) | INTRAVENOUS | Status: AC
  Administered 2016-07-19 – 2016-07-20 (×4): 25 g via INTRAVENOUS
  Filled 2016-07-19 (×4): qty 50
  Filled 2016-07-19: qty 100

## 2016-07-19 NOTE — Progress Notes (Addendum)
Progress Note  Patient Name: [name] Date of Encounter: 07/19/2016  Primary Cardiologist: Dr Cyril Mourning  Subjective   Remains intubated on vent, but at times is awake to answer questions and write on a pad.  Inpatient Medications    Scheduled Meds: . albumin human  25 g Intravenous Q6H  . alteplase (TPA) for intrapleural administration  10 mg Intrapleural Daily   And  . pulmozyme (DORNASE) for intrapleural administration  5 mg Intrapleural Daily  . alteplase (TPA) for intrapleural administration  10 mg Intrapleural Daily   And  . pulmozyme (DORNASE) for intrapleural administration  5 mg Intrapleural Daily  . atorvastatin  40 mg Per Tube q1800  . budesonide (PULMICORT) nebulizer solution  0.25 mg Nebulization BID  . cefTAZidime (FORTAZ)  IV  2 g Intravenous Q8H  . chlorhexidine gluconate (MEDLINE KIT)  15 mL Mouth Rinse BID  . Chlorhexidine Gluconate Cloth  6 each Topical Daily  . feeding supplement (PRO-STAT SUGAR FREE 64)  60 mL Per Tube BID  . feeding supplement (VITAL 1.5 CAL)  1,000 mL Per Tube Q24H  . free water  200 mL Per Tube Q4H  . heparin subcutaneous  5,000 Units Subcutaneous Q8H  . hydrocortisone sodium succinate  25 mg Intravenous Q12H  . insulin aspart  0-5 Units Subcutaneous Q4H  . insulin aspart  10 Units Subcutaneous Q4H  . ipratropium  0.5 mg Nebulization Q6H  . levalbuterol  1.25 mg Nebulization Q6H  . mouth rinse  15 mL Mouth Rinse QID  . multivitamin  15 mL Per Tube Daily  . pantoprazole sodium  40 mg Per Tube Daily  . potassium chloride  40 mEq Per Tube TID  . QUEtiapine  50 mg Per Tube BID  . sodium chloride flush  10-40 mL Intracatheter Q12H   Continuous Infusions: . dexmedetomidine (PRECEDEX) IV infusion Stopped (07/19/16 0800)  . norepinephrine (LEVOPHED) Adult infusion Stopped (07/18/16 2000)   PRN Meds: acetaminophen, bisacodyl, fentaNYL (SUBLIMAZE) injection, fentaNYL (SUBLIMAZE) injection, fentaNYL (SUBLIMAZE) injection,  levalbuterol, sodium chloride flush   Vital Signs    Vitals:   07/19/16 1200 07/19/16 1300 07/19/16 1400 07/19/16 1409  BP:      Pulse: 71 73 71   Resp: _0 Temp:      TempSrc:      SpO2:    100%  Weight:      Height:        Intake/Output Summary (Last 24 hours) at 07/19/16 1436 Last data filed at 07/19/16 1400  Gross per 24 hour  Intake          2142.12 ml  Output             4275 ml  Net         -2132.88 ml   Filed Weights   07/17/16 0100 07/18/16 0100 07/19/16 0140  Weight: 196 lb 3.4 oz (89 kg) 193 lb 5.5 oz (87.7 kg) 184 lb 8.4 oz (83.7 kg)    Telemetry    SR - Personally Reviewed  ECG    N/A - Personally Reviewed  Physical Exam   General: WM on vent, but awake. Head: Normocephalic, atraumatic.  Neck: Supple without bruits, + JVD. Lungs:  Resp regular and unlabored, Diminished bilaterally, diffuse rhonchi. Heart: RRR, S1, S2, no S3, S4, or murmur; no rub. Abdomen: Soft, non-tender, non-distended with normoactive bowel sounds. No hepatomegaly. No rebound/guarding. No obvious abdominal masses. Extremities: No clubbing, cyanosis, diffuse anasarca. Distal pedal pulses are 2+  bilaterally. Neuro: Alert and oriented. Psych: blunted  Labs    Chemistry Recent Labs Lab 07/14/16 0522  07/16/16 0310  07/17/16 3532  07/18/16 0025 07/18/16 0344 07/18/16 0921 07/19/16 0522  NA 138  < > 139  < > 137  < > 140  --  141 147*  K 4.2  < > 2.7*  < > 2.9*  < > 3.6  --  3.4* 3.1*  CL 93*  < > 94*  < > 93*  < > 97*  --  96* 98*  CO2 36*  < > 39*  < > 36*  < > 37*  --  38* 39*  GLUCOSE 212*  < > 130*  < > 237*  < > 349*  --  426* 320*  BUN 18  < > 26*  < > 22*  < > 29*  --  28* 33*  CREATININE 0.91  < > 0.89  < > 0.80  < > 0.86 0.87 0.91 1.01  CALCIUM 8.2*  < > 8.0*  < > 7.9*  < > 8.4*  --  8.6* 8.7*  PROT 6.2*  --  4.9*  --  5.3*  --   --   --   --   --   ALBUMIN 2.6*  --  1.7*  --  2.4*  --   --   --   --   --   AST 28  --  16  --  18  --   --   --   --   --    ALT 13*  --  9*  --  12*  --   --   --   --   --   ALKPHOS 71  --  66  --  59  --   --   --   --   --   BILITOT 0.7  --  0.8  --  0.9  --   --   --   --   --   GFRNONAA >60  < > >60  < > >60  < > >60 >60 >60 >60  GFRAA >60  < > >60  < > >60  < > >60 >60 >60 >60  ANIONGAP 9  < > 6  < > 8  < > 6  --  7 10  < > = values in this interval not displayed.   Hematology  Recent Labs Lab 07/17/16 0329 07/18/16 0344 07/19/16 0522  WBC 14.5* 13.2* 11.8*  RBC 3.23* 3.34* 3.40*  HGB 9.0* 8.9* 9.3*  HCT 27.5* 28.3* 29.6*  MCV 85.1 84.7 87.1  MCH 27.9 26.6 27.4  MCHC 32.7 31.4 31.4  RDW 16.4* 16.4* 16.5*  PLT 58* 105* 143*    Cardiac EnzymesNo results for input(s): TROPONINI in the last 168 hours. No results for input(s): TROPIPOC in the last 168 hours.   BNP  Recent Labs Lab 07/15/16 0900  BNP 95.6     DDimer No results for input(s): DDIMER in the last 168 hours.    Radiology    Dg Chest Port 1 View  Result Date: 07/19/2016 CLINICAL DATA:  Hypoxia EXAM: PORTABLE CHEST 1 VIEW COMPARISON:  Chest radiograph and chest CT July 17, 2016 FINDINGS: Endotracheal tube tip is 3.3 cm above the carina. Central catheter tip is in the superior vena cava. Nasogastric tube tip and side port are in the stomach. There is a chest tube on the right. No evident pneumothorax. There is  patchy atelectatic change in the left base. There is a small pleural effusion on the left. Pleural effusion on the right is smaller. No new opacity is identified. Heart is upper normal in size with pulmonary vascularity within normal limits. No adenopathy. No bone lesions. IMPRESSION: Tube and catheter positions as described without pneumothorax. Patchy atelectatic change noted in left base. Small pleural effusions bilaterally with effusion on the right smaller compared to 2 days prior. No new opacity evident. Stable cardiac silhouette. Electronically Signed   By: Lowella Grip III M.D.   On: 07/19/2016 08:12    Cardiac  Studies   TTE: 3/11  Study Conclusions  - Left ventricle: The cavity size was normal. Wall thickness was   increased in a pattern of mild LVH. Systolic function was normal.   The estimated ejection fraction was in the range of 60% to 65%.   Although no diagnostic regional wall motion abnormality was   identified, this possibility cannot be completely excluded on the   basis of this study. Features are consistent with a pseudonormal   left ventricular filling pattern, with concomitant abnormal   relaxation and increased filling pressure (grade 2 diastolic   dysfunction). - Mitral valve: Calcified annulus. Mildly calcified leaflets .   There was trivial regurgitation. - Right ventricle: The cavity size was mildly dilated. - Right atrium: Central venous pressure (est): 3 mm Hg. - Atrial septum: No defect or patent foramen ovale was identified. - Tricuspid valve: There was trivial regurgitation. - Pericardium, extracardiac: There was a right pleural effusion.  Impressions:  - Overall limited images. Mild LVH with LVEF 60-65%. Probable grade   2 diastolic dysfunction. Calcified mitral annulus with trivial   mitral regurgitation. Aortic valve is moderately calcified and   not well seen. Right ventricle appears mildly dilated. Trivial   tricuspid regurgitation. Right pleural effusion is evident.  Patient Profile     76 y.o. 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 Since admission his rate has been difficult to control but has converted to NSR with IV Amiodarone. Not a candidate for anticoagulation with undetectable PTLs and active bleeding requiring transfusion of blood and platelets. He has recurrent bilateral pleural effusions and went for thoracentesis 3/14 in preparation for splenectomy 3/15. The thoracentesis was only partially successful as the pt developed hypotension and the  procedure had to be aborted. The did have his splenectomy 07/14/15 and is for repeat thoracentesis.   Assessment & Plan    1. AF with RVR vs PSVT- he has been maintaining NSR with no further afib or SVT. -- now off IV amiodarone.. Coreg was held.  Would add low dose beta blocker when BP will allow.  He had AFib clearly on ECG in 2/18.  2. 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  - platelets increasing, now > 100  3. Blood loss anemia- Hgb dropped from 11.2 to 7. Transfused 07/10/16 and stable at 9.3.   4. Pleural effusions- Recurrent pleural effusions this admission - thoracentesis 07/12/16- partially successful-1.2L removed from Lt but then pt became hypotensive.  - repeat thoracentesis with 1600cc of serosanguinous fluid on left and 900cc on right s/p bilateral chest tubes. Chest tubes remain in place with serosanguinous drainage.  Unclogged with tPA.  5. Acute on chronic diastolic CHF EF 51-88% with LVH and grade 2 DD - swelling improving   6.  Hypokalemia - replete and keep > 4 per CCM - mg 2.1 at last check  Signed, Larae Grooms, MD  07/19/2016, 2:36 PM

## 2016-07-19 NOTE — Progress Notes (Signed)
PULMONARY / CRITICAL CARE MEDICINE   Name: [name] MRN: [id number] DOB: [birth year]    ADMISSION DATE:  07/08/2016 CONSULTATION DATE: 3/15  REFERRING MD:  Barry Dienes   CHIEF COMPLAINT:  Vent management    SUBJECTIVE:  Looks much better. No distress. Appropriate today   VITAL SIGNS: BP (!) 102/45   Pulse 68   Temp 98.1 F (36.7 C) (Axillary)   Resp 19   Ht '5\' 11"'$  (1.803 m)   Wt 184 lb 8.4 oz (83.7 kg)   SpO2 100%   BMI 25.74 kg/m  100% NRB HEMODYNAMICS: CVP:  [9 mmHg] 9 mmHg  VENTILATOR SETTINGS: Vent Mode: PRVC FiO2 (%):  [30 %] 30 % Set Rate:  [12 bmp] 12 bmp Vt Set:  [600 mL] 600 mL PEEP:  [5 cmH20] 5 cmH20 Pressure Support:  [10 cmH20] 10 cmH20 Plateau Pressure:  [24 cmH20-26 cmH20] 26 cmH20  INTAKE / OUTPUT:  Intake/Output Summary (Last 24 hours) at 07/19/16 4098 Last data filed at 07/19/16 0654  Gross per 24 hour  Intake          1235.74 ml  Output             4650 ml  Net         -3414.26 ml   General appearance:  76 Year old  Male,  cachectic  NAD,t  Eyes: anicteric sclerae, moist conjunctivae; PERRL, EOMI bilaterally. Mouth:  membranes and no mucosal ulcerations; normal hard and soft palate, orally intubated Neck: Trachea midline; neck supple, no JVD Lungs/chest: scattered rhonchi.  with normal respiratory effort and no intercostal retractions. Bilateral Chest tubes w/out airleak. Fluid output clearing since last night.  CV: RRR, no MRGs NSR on tele Abdomen: Soft, non-tender; no masses or HSM. Tole tubefeeds Extremities: Anasarca is remarkably improved.  Skin: Normal temperature, turgor and texture; no rash, ulcers or subcutaneous nodules Psych: Appropriate affect, alert and oriented to person, place and time    LABS:  BMET  Recent Labs Lab 07/18/16 0025 07/18/16 0344 07/18/16 0921 07/19/16 0522  NA 140  --  141 147*  K 3.6  --  3.4* 3.1*  CL 97*  --  96* 98*  CO2 37*  --  38* 39*  BUN 29*  --  28* 33*  CREATININE 0.86 0.87 0.91  1.01  GLUCOSE 349*  --  426* 320*    Electrolytes  Recent Labs Lab 07/17/16 1054  07/18/16 0025 07/18/16 0344 07/18/16 0921 07/18/16 1700 07/19/16 0522  CALCIUM 8.1*  < > 8.4*  --  8.6*  --  8.7*  MG 1.8  < > 2.2 2.0 1.9 2.1  --   PHOS 2.9  --   --  1.7*  --  2.9  --   < > = values in this interval not displayed. CBC  Recent Labs Lab 07/17/16 0329 07/18/16 0344 07/19/16 0522  WBC 14.5* 13.2* 11.8*  HGB 9.0* 8.9* 9.3*  HCT 27.5* 28.3* 29.6*  PLT 58* 105* 143*   Coag's No results for input(s): APTT, INR in the last 168 hours. Sepsis Markers No results for input(s): LATICACIDVEN, PROCALCITON, O2SATVEN in the last 168 hours.  ABG  Recent Labs Lab 07/14/16 1527 07/16/16 0500 07/17/16 0500  PHART 7.471* 7.574* 7.576*  PCO2ART 52.1* 40.5 40.3  PO2ART 95.0 72.8* 96.7   Liver Enzymes  Recent Labs Lab 07/14/16 0522 07/16/16 0310 07/17/16 0329  AST '28 16 18  '$ ALT 13* 9* 12*  ALKPHOS 71 66 59  BILITOT 0.7 0.8  0.9  ALBUMIN 2.6* 1.7* 2.4*   Cardiac Enzymes No results for input(s): TROPONINI, PROBNP in the last 168 hours. Glucose  Recent Labs Lab 07/18/16 0840 07/18/16 1220 07/18/16 1551 07/18/16 1901 07/18/16 2338 07/19/16 0356  GLUCAP 396* 332* 242* 212* 345* 344*   Imaging No results found.   STUDIES/events Splenectomy 3/15.  3/15 self extubaetd Right thora 3/16.Marland Kitchen 800 clearly exudate. Appears like old blood Pleural cytology 3/16>>> 3/16 bilateral chest tubes placed d/t old bloody appearing effusions w/ significant fibrinous debris noted on [place].  3/19:Pleural fluid grew Pseudomonas therefore this was EMPYEMA. CXR showing fluid has now filled loculated spaces where had loculated PTX after tube placement. Massive Anasarca. Now weaning some. Starting lasix gtt. CT chest.  CT chest 3/19: Partially loculated moderate bilateral hydropneumothoraces with bilateral chest tubes in place. Bilateral lower lobe airspace opacities. Ground-glass  nodular opacities in the right upper lobe and lingula. Findings concerning for pneumonia. (personally reviewed by PB 3/20) 3/20: started pleural TPA/DNase. Developed delirium and to some degree paranoia. Placed on Seroquel and precedex.  3/21: 3.6 liters down since starting diuresis. Chest tubes put out a liter over 24hrs not counting loss around insertion site. Day #2/3 TPA/DNase.   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 Looks like we have made a recovery here.  Plan Start [place] heparin for DVT prophylaxis f/u am cbc  Ventilator dependence (HCC) d/t Empyema, and volume overload. Complicated further by baseline COPD and Bronchiectasis  Plan Cont weaning efforts Cont BDs Cont diuretics but change to acetazolamide day # 1/3  Hypotension: exacerbated by medications and fluid shifts. Sepsis resolving. Off pressors again.  Plan Levophed for MAP >65 Suspect we are close to getting intravasc dry (backing down on diuretics) Cont to wean steroids to off  Empyema (HCC) Pseudomonas w/ multiple bilateral loculations Plan Day #2/3 planned TPA/DNAase Day 4/14 ceftaz Keep CTs at 20 cmH2O Repeat CXR in am   Paroxysmal SVT (supraventricular tachycardia) (HCC) -remains in NSR Plan Will dc amiodarone   Chronic diastolic CHF (congestive heart failure) ([place]) Plan Cont to hold coreg for now Cont diuretics   Disorders of fluid, electrolyte, and acid-base balance Anasarca Now hypernatremic and hypokalemic again  metabolic alk Plan  Change to Acetazolamide day #1/3 Replace free water Replace K  Repeat ABG Repeat albumin again today    Diabetes mellitus  without complication (HCC) Plan Titrate up basal insulin w/ ssi   GERD (gastroesophageal reflux disease) Plan Cont PPI  Anemia in acute on chronic illness Plan Trend cbc Transfuse per ICU protocol   Severe protein-calorie malnutrition ([place]) Plan Cont tube feeds  Severe muscle deconditioning Plan Will need extensive PT  Acute encephalopathy->better w/ Seroquel and precedex Plan Cont Seroquel Keep precedex as needed RAS goal 0--1   Discussion Looking remarkably better today. His Anasarca has improved, delirium better. Had > 1 liter output from chest tubes which was initially serous sang and then cleared so hope that means we made some progress w/ the loculations. He is getting close to euvolemia and has metabolic alkalosis as a result of his chronic resp failure AND diuretics so will need to stop lasix For today -change lasix to diamox -1 more day albumin -add free water and replace lytes -dc  amiodarone -have already placed pleural TPA, will come back later and place DNase -have adjusted insulin and steroids -cont abx -up-dosed seroquel in hopes to dc precedex tomorrow -wean as tolerated. Will repeat CXR in am. Might be close to extubation in next 24-48hrs.   My critical care time 54 minutes.   Erick Colace ACNP-BC [place] Pager # [phone] OR # [phone] if no answer   07/19/2016, 8:07 AM

## 2016-07-19 NOTE — Progress Notes (Signed)
[facility]:  [phone] General Surgery Progress Note   LOS: 10 days  POD -  6 Days Post-Op  Assessment/Plan: 1.  LAPAROSCOPIC SPLENECTOMY - 07/13/2016 - [place]  For ITP  Platelets - 143,000 - 07/19/2016  Improvement in platelets  Tolerating tube feedings  2.  Respiratory failure - on vent  Ceftaxidime 3.  Severe COPD 4.  Bilateral chest tubes 5.  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 Complicated further by baseline COPD and Bronchiectasis    Disorders of fluid, electrolyte, and acid-base balance   Anemia in acute on chronic illness   Severe protein-calorie malnutrition (HCC)   Anasarca   Severe muscle deconditioning   Septic shock (HCC)   Acute encephalopathy   Subjective:  Intubated.  Sedated.  Objective:   Vitals:   07/19/16 0326 07/19/16 0358  BP: (!) 102/45   Pulse: 68   Resp: 19   Temp:  98.1 F (36.7 C)     Intake/Output from previous day:  03/20 0701 - 03/21 0700 In: 1331.7 [I.V.:331.7; NG/GT:600; IV Piggyback:400] Out: 0981 [Urine:3905; Chest Tube:1045]  Intake/Output this shift:  No intake/output data recorded.   Physical Exam:   General: WN older WM who is intubated.   HEENT: Normal. Pupils equal. .   Lungs: Decreased breath sounds biaterally.  Bilateral chest tubes.   Abdomen: Soft.  Has BS.   Wound: Covered   Lab Results:    Recent Labs  07/18/16 0344 07/19/16 0522  WBC 13.2* 11.8*  HGB 8.9* 9.3*  HCT 28.3* 29.6*  PLT 105* 143*    BMET   Recent Labs  07/18/16 0921 07/19/16 0522  NA 141 147*  K 3.4* 3.1*  CL 96* 98*  CO2  38* 39*  GLUCOSE 426* 320*  BUN 28* 33*  CREATININE 0.91 1.01  CALCIUM 8.6* 8.7*    PT/INR  No results for input(s): LABPROT, INR in the last 72 hours.  ABG   Recent Labs  07/17/16 0500  PHART 7.576*  HCO3 37.6*     Studies/Results:  Ct Chest Wo Contrast  Result Date: 07/17/2016 CLINICAL DATA:  Right pleural effusion. EXAM: CT CHEST WITHOUT CONTRAST TECHNIQUE: Multidetector CT imaging of the chest was performed following the standard protocol without IV contrast. COMPARISON:  06/09/2016 FINDINGS: Cardiovascular: Heart is normal size. Coronary artery calcifications diffusely. Scattered aortic calcifications. No evidence of aortic aneurysm. Mediastinum/Nodes: No mediastinal, hilar, or axillary adenopathy. Endotracheal tube tip is in the mid trachea. Lungs/Pleura: There are moderate bilateral pleural effusions. Bilateral chest tubes are in place. Locules of pleural care noted bilaterally. The locules of gas are not fall anterior in location compatible with multiple loculations bilaterally, right worse than left. Airspace disease noted in the lower lobes bilaterally. Ground-glass nodular opacities throughout the right upper lung and to a lesser extent lingula. Findings concerning for pneumonia. Upper Abdomen: NG tube is in the stomach. No acute findings in the upper abdomen. Musculoskeletal: Chest wall soft tissues are unremarkable. No acute bony abnormality. IMPRESSION: Partially loculated moderate bilateral hydropneumothoraces with bilateral chest tubes in place. Bilateral lower lobe airspace opacities. Ground-glass nodular opacities in the right upper lobe and lingula.  Findings concerning for pneumonia. Coronary artery disease. Electronically Signed   By: Rolm Baptise M.D.   On: 07/17/2016 14:39     Anti-infectives:   Anti-infectives    Start     Dose/Rate Route Frequency Ordered Stop   07/16/16 1200  cefTAZidime (FORTAZ) 2 g in dextrose 5 % 50 mL IVPB     2 g 100 mL/hr over 30 Minutes  Intravenous Every 8 hours 07/16/16 0942     07/15/16 0600  vancomycin (VANCOCIN) 1,250 mg in sodium chloride 0.9 % 250 mL IVPB  Status:  Discontinued     1,250 mg 166.7 mL/hr over 90 Minutes Intravenous Every 12 hours 07/14/16 1901 07/16/16 0942   07/15/16 0200  piperacillin-tazobactam (ZOSYN) IVPB 3.375 g  Status:  Discontinued     3.375 g 12.5 mL/hr over 240 Minutes Intravenous Every 8 hours 07/14/16 1901 07/16/16 0942   07/14/16 1930  vancomycin (VANCOCIN) 1,500 mg in sodium chloride 0.9 % 500 mL IVPB     1,500 mg 250 mL/hr over 120 Minutes Intravenous  Once 07/14/16 1901 07/14/16 2251   07/14/16 1930  piperacillin-tazobactam (ZOSYN) IVPB 3.375 g     3.375 g 100 mL/hr over 30 Minutes Intravenous  Once 07/14/16 1901 07/14/16 2058      Alphonsa Overall, MD, FACS Pager: [facility]: [phone] 07/19/2016

## 2016-07-20 ENCOUNTER — Encounter (HOSPITAL_COMMUNITY): Payer: Medicare HMO | Admitting: Internal Medicine

## 2016-07-20 ENCOUNTER — Inpatient Hospital Stay (HOSPITAL_COMMUNITY): Payer: Medicare HMO

## 2016-07-20 LAB — GLUCOSE, CAPILLARY
GLUCOSE-CAPILLARY: 240 mg/dL — AB (ref 65–99)
GLUCOSE-CAPILLARY: 222 mg/dL — AB (ref 65–99)
GLUCOSE-CAPILLARY: 150 mg/dL — AB (ref 65–99)
Glucose-Capillary: 332 mg/dL — ABNORMAL HIGH (ref 65–99)
Glucose-Capillary: 307 mg/dL — ABNORMAL HIGH (ref 65–99)

## 2016-07-20 LAB — BLOOD GAS, ARTERIAL
ACID-BASE EXCESS: 10.1 mmol/L — AB (ref 0.0–2.0)
BICARBONATE: 35 mmol/L — AB (ref 20.0–28.0)
DRAWN BY: 422461
FIO2: 30
O2 SAT: 94.2 %
PEEP: 5 cmH2O
PH ART: 7.46 — AB (ref 7.350–7.450)
Patient temperature: 97.6
RATE: 12 resp/min
VT: 600 mL
pCO2 arterial: 49.6 mmHg — ABNORMAL HIGH (ref 32.0–48.0)
pO2, Arterial: 71.8 mmHg — ABNORMAL LOW (ref 83.0–108.0)

## 2016-07-20 LAB — CBC WITH DIFFERENTIAL/PLATELET
BASOS ABS: 0 10*3/uL (ref 0.0–0.1)
BASOS PCT: 0 %
Eosinophils Absolute: 0 10*3/uL (ref 0.0–0.7)
Eosinophils Relative: 0 %
HEMATOCRIT: 29.2 % — AB (ref 39.0–52.0)
Hemoglobin: 9.1 g/dL — ABNORMAL LOW (ref 13.0–17.0)
LYMPHS PCT: 6 %
Lymphs Abs: 0.8 10*3/uL (ref 0.7–4.0)
MCH: 27.7 pg (ref 26.0–34.0)
MCHC: 31.2 g/dL (ref 30.0–36.0)
MCV: 89 fL (ref 78.0–100.0)
MONO ABS: 0.8 10*3/uL (ref 0.1–1.0)
Monocytes Relative: 6 %
NEUTROS ABS: 11.7 10*3/uL — AB (ref 1.7–7.7)
Neutrophils Relative %: 88 %
PLATELETS: 187 10*3/uL (ref 150–400)
RBC: 3.28 MIL/uL — AB (ref 4.22–5.81)
RDW: 16.9 % — AB (ref 11.5–15.5)
WBC: 13.4 10*3/uL — AB (ref 4.0–10.5)

## 2016-07-20 LAB — CREATININE, SERUM: Creatinine, Ser: 1.02 mg/dL (ref 0.61–1.24)

## 2016-07-20 LAB — BASIC METABOLIC PANEL
ANION GAP: 7 (ref 5–15)
BUN: 41 mg/dL — ABNORMAL HIGH (ref 6–20)
CHLORIDE: 108 mmol/L (ref 101–111)
CO2: 34 mmol/L — ABNORMAL HIGH (ref 22–32)
CREATININE: 0.9 mg/dL (ref 0.61–1.24)
Calcium: 9 mg/dL (ref 8.9–10.3)
GFR calc non Af Amer: >60 mL/min (ref >60)
Glucose, Bld: 237 mg/dL — ABNORMAL HIGH (ref 65–99)
POTASSIUM: 3.1 mmol/L — AB (ref 3.5–5.1)
Sodium: 149 mmol/L — ABNORMAL HIGH (ref 135–145)

## 2016-07-20 MED ORDER — METOPROLOL TARTRATE 5 MG/5ML IV SOLN
2.5000 mg | Freq: Once | INTRAVENOUS | Status: AC
  Administered 2016-07-20: 2.5 mg via INTRAVENOUS

## 2016-07-20 MED ORDER — ADULT MULTIVITAMIN W/MINERALS CH
1.0000 | ORAL_TABLET | Freq: Every day | ORAL | Status: DC
  Administered 2016-07-21 – 2016-08-02 (×13): 1 via ORAL
  Filled 2016-07-20 (×13): qty 1

## 2016-07-20 MED ORDER — METOPROLOL TARTRATE 25 MG PO TABS: 25.0000 mg | ORAL_TABLET | Freq: Two times a day (BID) | ORAL | Status: DC

## 2016-07-20 MED ORDER — ACETAZOLAMIDE SODIUM 500 MG IJ SOLR
250.0000 mg | Freq: Once | INTRAMUSCULAR | Status: AC
  Administered 2016-07-20: 250 mg via INTRAVENOUS
  Filled 2016-07-20: qty 500

## 2016-07-20 MED ORDER — BOOST PLUS PO LIQD
237.0000 mL | Freq: Three times a day (TID) | ORAL | Status: DC
  Administered 2016-07-20 – 2016-08-01 (×19): 237 mL via ORAL
  Filled 2016-07-20 (×41): qty 237

## 2016-07-20 MED ORDER — MAGNESIUM SULFATE 2 GM/50ML IV SOLN
2.0000 g | Freq: Once | INTRAVENOUS | Status: AC
  Administered 2016-07-20: 2 g via INTRAVENOUS
  Filled 2016-07-20: qty 50

## 2016-07-20 MED ORDER — METOPROLOL TARTRATE 5 MG/5ML IV SOLN
2.5000 mg | Freq: Four times a day (QID) | INTRAVENOUS | Status: DC
  Administered 2016-07-20: 2.5 mg via INTRAVENOUS
  Filled 2016-07-20: qty 5

## 2016-07-20 MED ORDER — HYDROCORTISONE NA SUCCINATE PF 100 MG IJ SOLR
25.0000 mg | Freq: Every day | INTRAMUSCULAR | Status: DC
  Administered 2016-07-21: 25 mg via INTRAVENOUS
  Filled 2016-07-20: qty 2

## 2016-07-20 MED ORDER — METOPROLOL TARTRATE 5 MG/5ML IV SOLN
2.5000 mg | INTRAVENOUS | Status: DC | PRN
  Administered 2016-07-22: 2.5 mg via INTRAVENOUS
  Filled 2016-07-20: qty 5

## 2016-07-20 MED ORDER — POTASSIUM CHLORIDE 20 MEQ/15ML (10%) PO SOLN
40.0000 meq | Freq: Three times a day (TID) | ORAL | Status: DC
Start: 2016-07-20 — End: 2016-07-20
  Administered 2016-07-20: 40 meq
  Filled 2016-07-20 (×2): qty 30

## 2016-07-20 MED ORDER — INSULIN ASPART 100 UNIT/ML ~~LOC~~ SOLN: 15.0000 [IU] | SUBCUTANEOUS | Status: DC

## 2016-07-20 MED ORDER — ORAL CARE MOUTH RINSE
15.0000 mL | Freq: Two times a day (BID) | OROMUCOSAL | Status: DC
  Administered 2016-07-21 – 2016-08-01 (×15): 15 mL via OROMUCOSAL

## 2016-07-20 MED ORDER — CARVEDILOL 3.125 MG PO TABS
3.1250 mg | ORAL_TABLET | Freq: Two times a day (BID) | ORAL | Status: DC
  Administered 2016-07-20 – 2016-07-24 (×8): 3.125 mg via ORAL
  Filled 2016-07-20 (×9): qty 1

## 2016-07-20 MED ORDER — POTASSIUM CHLORIDE 20 MEQ/15ML (10%) PO SOLN
40.0000 meq | Freq: Three times a day (TID) | ORAL | Status: AC
  Administered 2016-07-20: 40 meq via ORAL
  Filled 2016-07-20: qty 30

## 2016-07-20 MED ORDER — INSULIN ASPART 100 UNIT/ML ~~LOC~~ SOLN
0.0000 [IU] | Freq: Three times a day (TID) | SUBCUTANEOUS | Status: DC
Start: 2016-07-20 — End: 2016-07-22
  Administered 2016-07-20: 4 [IU] via SUBCUTANEOUS
  Administered 2016-07-21 (×2): 2 [IU] via SUBCUTANEOUS
  Administered 2016-07-21: 3 [IU] via SUBCUTANEOUS
  Administered 2016-07-22: 2 [IU] via SUBCUTANEOUS

## 2016-07-20 NOTE — Progress Notes (Signed)
PULMONARY / CRITICAL CARE MEDICINE   Name: [name] MRN: [id number] DOB: 12-18-1940    ADMISSION DATE:  07/08/2016 CONSULTATION DATE: 3/15  REFERRING MD:  Donell Beers   CHIEF COMPLAINT:  Vent management    SUBJECTIVE:  Awake and appropriate   VITAL SIGNS: BP (!) 116/52   Pulse 87   Temp 97.9 F (36.6 C) (Oral)   Resp 14   Ht 5\' 11"  (1.803 m)   Wt 184 lb 8.4 oz (83.7 kg)   SpO2 98%   BMI 25.74 kg/m  100% NRB HEMODYNAMICS: CVP:  [9 mmHg-16 mmHg] 9 mmHg  VENTILATOR SETTINGS: Vent Mode: PSV;CPAP FiO2 (%):  [30 %] 30 % Set Rate:  [12 bmp] 12 bmp Vt Set:  [600 mL] 600 mL PEEP:  [5 cmH20] 5 cmH20 Pressure Support:  [5 cmH20] 5 cmH20 Plateau Pressure:  [21 cmH20-25 cmH20] 21 cmH20  INTAKE / OUTPUT:  Intake/Output Summary (Last 24 hours) at 07/20/16 07/22/16 Last data filed at 07/20/16 0700  Gross per 24 hour  Intake             1260 ml  Output             2875 ml  Net            -1615 ml   General appearance:  76 Year old  Male,  NAD, conversant, interactive and in good spirits.  Eyes: anicteric sclerae, moist conjunctivae; PERRL, EOMI bilaterally. Mouth:  membranes and no mucosal ulcerations; normal hard and soft palate, orally intubated Neck: Trachea midline; neck supple, no JVD Lungs/chest: diffuse rhonchi, with normal respiratory effort and no intercostal retractions. No airleak in bilateral chest tubes. Fluid output over last 24 hours still sig w/ total of 1225 ml!  CV: RRR, no MRGs  Abdomen: Soft, non-tender; no masses or HSM Extremities:peripheral edema now isolated to lower extremities only Skin: Normal temperature, turgor and texture; no rash, ulcers or subcutaneous nodules Neuro/Psych: Appropriate affect, alert and oriented to person, place and time   LABS:  BMET  Recent Labs Lab 07/18/16 0025  07/18/16 0921 07/19/16 0522 07/20/16 0437  NA 140  --  141 147*  --   K 3.6  --  3.4* 3.1*  --   CL 97*  --  96* 98*  --   CO2 37*  --  38* 39*  --   BUN  29*  --  28* 33*  --   CREATININE 0.86  < > 0.91 1.01 1.02  GLUCOSE 349*  --  426* 320*  --   < > = values in this interval not displayed.  Electrolytes  Recent Labs Lab 07/17/16 1054  07/18/16 0025 07/18/16 0344 07/18/16 0921 07/18/16 1700 07/19/16 0522  CALCIUM 8.1*  < > 8.4*  --  8.6*  --  8.7*  MG 1.8  < > 2.2 2.0 1.9 2.1  --   PHOS 2.9  --   --  1.7*  --  2.9  --   < > = values in this interval not displayed. CBC  Recent Labs Lab 07/18/16 0344 07/19/16 0522 07/20/16 0437  WBC 13.2* 11.8* 13.4*  HGB 8.9* 9.3* 9.1*  HCT 28.3* 29.6* 29.2*  PLT 105* 143* 187   Coag's No results for input(s): APTT, INR in the last 168 hours. Sepsis Markers No results for input(s): LATICACIDVEN, PROCALCITON, O2SATVEN in the last 168 hours.  ABG  Recent Labs Lab 07/16/16 0500 07/17/16 0500 07/20/16 0350  PHART 7.574* 7.576* 7.460*  PCO2ART  40.5 40.3 49.6*  PO2ART 72.8* 96.7 71.8*   Liver Enzymes  Recent Labs Lab 07/14/16 0522 07/16/16 0310 07/17/16 0329  AST '28 16 18  '$ ALT 13* 9* 12*  ALKPHOS 71 66 59  BILITOT 0.7 0.8 0.9  ALBUMIN 2.6* 1.7* 2.4*   Cardiac Enzymes No results for input(s): TROPONINI, PROBNP in the last 168 hours. Glucose  Recent Labs Lab 07/19/16 1201 07/19/16 1603 07/19/16 1955 07/19/16 2335 07/20/16 0334 07/20/16 0842  GLUCAP 277* 232* 175* 164* 240* 222*   Imaging Dg Chest Port 1 View  Result Date: 07/20/2016 CLINICAL DATA:  Follow-up pneumonia, check chest tube status EXAM: PORTABLE CHEST 1 VIEW COMPARISON:  07/19/2016 FINDINGS: Cardiac shadow is stable. An endotracheal tube, nasogastric catheter and right-sided PICC line are again seen and stable. Bilateral thoracostomy catheters are noted. No pneumothorax is seen. Minimal bibasilar atelectasis is noted. IMPRESSION: No significant change from the prior exam. Electronically Signed   By: Inez Catalina M.D.   On: 07/20/2016 07:00     STUDIES/events Splenectomy 3/15.  3/15 self  extubaetd Right thora 3/16.Marland Kitchen 800 clearly exudate. Appears like old blood Pleural cytology 3/16>>> 3/16 bilateral chest tubes placed d/t old bloody appearing effusions w/ significant fibrinous debris noted on [place].  3/19:Pleural fluid grew Pseudomonas therefore this was EMPYEMA. CXR showing fluid has now filled loculated spaces where had loculated PTX after tube placement. Massive Anasarca. Now weaning some. Starting lasix gtt. CT chest.  CT chest 3/19: Partially loculated moderate bilateral hydropneumothoraces with bilateral chest tubes in place. Bilateral lower lobe airspace opacities. Ground-glass nodular opacities in the right upper lobe and lingula. Findings concerning for pneumonia. (personally reviewed by PB 3/20) 3/20: started pleural TPA/DNase. Developed delirium and to some degree paranoia. Placed on Seroquel and precedex.  3/21: 3.6 liters down since starting diuresis. Chest tubes put out a liter over 24hrs not counting loss around insertion site. Day #2/3 TPA/DNase. Amio stopped, lasix stopped d/t met alk.  3/22 SBT started. Day #3/3 TPA/DNase. Looks great!   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 Started [place] heparin 3/22 Plan Post-op wound care per surg Change cbc to intermittent to start more conservative blood draw practice  Ventilator dependence ([place]) d/t Empyema, and volume overload. Complicated further by baseline COPD and Bronchiectasis  -CXR remarkably better. Has basilar atx but no appreciable effusion as of 3/22 -currently on SBT. F/vt acceptable.  Plan Cont SBT thru his TPA/DNase this am.  If still looks good will extubate if  looks fatigued or F/VT drops will cont PSV as tolerated Cont IV diuresis day #2/3 planned Cont BDs  Get OOB   Empyema ([place]) Pseudomonas w/ mult bilateral loculations.  s/p CT drainage & currently on day #3/3 TPA/DNase Plan Day 5/14 ceftaz Cont CT drainage w/ bilateral CTs at 20 cmH2O sxn Repeat CXR in am   Septic shock ([place]). Intermittently levo dependent. Seems more drug related than sepsis at this point Plan Levo PRN Wean steroids Cont tele  Cont abx  Paroxysmal SVT (supraventricular tachycardia) (HCC) Remains in NSR. Stopped amio yesterday  Plan Cont tele   Chronic diastolic CHF (congestive heart failure) (HCC) Plan Holding coreg  Cont diuresis    Disorders of fluid, electrolyte, and acid-base balance  Anasarca-->looking better Plan Awaiting am labs Cont diuresis as BP/BUN and cr allow Cont free water  ABG in am Replace lytes as indicated by labs    Diabetes mellitus without complication (HCC) Glucose still very high Plan Decrease steroids again Increase basal insulin and cont ssi   GERD (gastroesophageal reflux disease) Plan Cont ssi   Anemia in acute on chronic illness H&H w/out evidence of active bleeding.  Plan Intermittent CBC  Severe protein-calorie malnutrition ([place]) Plan Cont tubefeeds  Severe muscle deconditioning Plan PT consult after extubation   Acute encephalopathy -seemingly resolved.  Plan  Dc precedex Hold seroquel at current  Discussion [place] is continuing to improve. His delirium has resolved, his CXR shows minimal effusions and tube drainage w/ TPA and DNase appears to have been fairly successful. He is currently weaning For today Cont diuresis day 2/3 diamox Cont SBT thru his TPA/DNase. If still looks good will extubate this am. If F/vt drops or he is working hard will cont another day of PSV.  His CT output is fairly high still. Will need to keep these until < 100 ml/d output.  Overall I am pleased w/ his  progress.  My critical care time 49 minutes.   Erick Colace ACNP-BC [place] Pager # [phone] OR # [phone] if no answer   Pager # [phone] OR # [phone] if no answer   07/20/2016, 9:24 AM

## 2016-07-20 NOTE — Progress Notes (Addendum)
Progress Note  Patient Name: [name] Date of Encounter: 07/20/2016  Primary Cardiologist: Dr Cyril Mourning  Subjective   Still intubated, hopefully to be extubated today. Looks better.   Inpatient Medications    Scheduled Meds: . atorvastatin  40 mg Per Tube q1800  . budesonide (PULMICORT) nebulizer solution  0.25 mg Nebulization BID  . cefTAZidime (FORTAZ)  IV  2 g Intravenous Q8H  . chlorhexidine gluconate (MEDLINE KIT)  15 mL Mouth Rinse BID  . Chlorhexidine Gluconate Cloth  6 each Topical Daily  . feeding supplement (PRO-STAT SUGAR FREE 64)  60 mL Per Tube BID  . feeding supplement (VITAL 1.5 CAL)  1,000 mL Per Tube Q24H  . free water  200 mL Per Tube Q4H  . heparin subcutaneous  5,000 Units Subcutaneous Q8H  . [START ON 07/21/2016] hydrocortisone sodium succinate  25 mg Intravenous Daily  . insulin aspart  0-5 Units Subcutaneous Q4H  . insulin aspart  15 Units Subcutaneous Q4H  . ipratropium  0.5 mg Nebulization Q6H  . levalbuterol  1.25 mg Nebulization Q6H  . magnesium sulfate 1 - 4 g bolus IVPB  2 g Intravenous Once  . mouth rinse  15 mL Mouth Rinse QID  . metoprolol  2.5 mg Intravenous Q6H  . multivitamin  15 mL Per Tube Daily  . pantoprazole sodium  40 mg Per Tube Daily  . potassium chloride  40 mEq Per Tube TID  . QUEtiapine  50 mg Per Tube BID  . sodium chloride flush  10-40 mL Intracatheter Q12H   Continuous Infusions: . norepinephrine (LEVOPHED) Adult infusion Stopped (07/18/16 2000)   PRN Meds: acetaminophen, bisacodyl, fentaNYL (SUBLIMAZE) injection, fentaNYL (SUBLIMAZE) injection, fentaNYL (SUBLIMAZE) injection, levalbuterol, sodium chloride flush   Vital Signs    Vitals:   07/20/16 0700 07/20/16 0821 07/20/16 0822 07/20/16 0900  BP:      Pulse: 87   (!) 102  Resp: 14   (!) 30  Temp:  97.9 F (36.6 C)    TempSrc:  Oral    SpO2:   98%   Weight:      Height:        Intake/Output Summary (Last 24 hours) at 07/20/16 1034 Last data  filed at 07/20/16 0931  Gross per 24 hour  Intake             1120 ml  Output             3025 ml  Net            -1905 ml   Filed Weights   07/18/16 0100 07/19/16 0140 07/20/16 0500  Weight: 193 lb 5.5 oz (87.7 kg) 184 lb 8.4 oz (83.7 kg) 184 lb 8.4 oz (83.7 kg)    Telemetry    SR, with episodes of SVT - Personally Reviewed  ECG    N/A - Personally Reviewed  Physical Exam   General: WM on vent, but awake. Head: Normocephalic, atraumatic.  Neck: Supple without bruits, JVD. Lungs:  Resp regular and unlabored, Diminished bilaterally, diffuse rhonchi. Heart: RRR, S1, S2, no S3, S4, or murmur; no rub. Abdomen: Soft, non-tender, non-distended with normoactive bowel sounds. No hepatomegaly. No rebound/guarding. No obvious abdominal masses. Extremities: No clubbing, cyanosis, 1+ LE edema. Distal pedal pulses are 2+ bilaterally. Neuro: Alert and oriented. Psych: alert  Labs    Chemistry Recent Labs Lab 07/14/16 0522  07/16/16 0310  07/17/16 6712  07/18/16 0025  07/18/16 4580 07/19/16 0522 07/20/16 0437  NA 138  < >  139  < > 137  < > 140  --  141 147*  --   K 4.2  < > 2.7*  < > 2.9*  < > 3.6  --  3.4* 3.1*  --   CL 93*  < > 94*  < > 93*  < > 97*  --  96* 98*  --   CO2 36*  < > 39*  < > 36*  < > 37*  --  38* 39*  --   GLUCOSE 212*  < > 130*  < > 237*  < > 349*  --  426* 320*  --   BUN 18  < > 26*  < > 22*  < > 29*  --  28* 33*  --   CREATININE 0.91  < > 0.89  < > 0.80  < > 0.86  < > 0.91 1.01 1.02  CALCIUM 8.2*  < > 8.0*  < > 7.9*  < > 8.4*  --  8.6* 8.7*  --   PROT 6.2*  --  4.9*  --  5.3*  --   --   --   --   --   --   ALBUMIN 2.6*  --  1.7*  --  2.4*  --   --   --   --   --   --   AST 28  --  16  --  18  --   --   --   --   --   --   ALT 13*  --  9*  --  12*  --   --   --   --   --   --   ALKPHOS 71  --  66  --  59  --   --   --   --   --   --   BILITOT 0.7  --  0.8  --  0.9  --   --   --   --   --   --   GFRNONAA >60  < > >60  < > >60  < > >60  < > >60 >60 >60    GFRAA >60  < > >60  < > >60  < > >60  < > >60 >60 >60  ANIONGAP 9  < > 6  < > 8  < > 6  --  7 10  --   < > = values in this interval not displayed.   Hematology  Recent Labs Lab 07/18/16 0344 07/19/16 0522 07/20/16 0437  WBC 13.2* 11.8* 13.4*  RBC 3.34* 3.40* 3.28*  HGB 8.9* 9.3* 9.1*  HCT 28.3* 29.6* 29.2*  MCV 84.7 87.1 89.0  MCH 26.6 27.4 27.7  MCHC 31.4 31.4 31.2  RDW 16.4* 16.5* 16.9*  PLT 105* 143* 187    Cardiac EnzymesNo results for input(s): TROPONINI in the last 168 hours. No results for input(s): TROPIPOC in the last 168 hours.   BNP  Recent Labs Lab 07/15/16 0900  BNP 95.6     DDimer No results for input(s): DDIMER in the last 168 hours.    Radiology    Dg Chest Port 1 View  Result Date: 07/20/2016 CLINICAL DATA:  Follow-up pneumonia, check chest tube status EXAM: PORTABLE CHEST 1 VIEW COMPARISON:  07/19/2016 FINDINGS: Cardiac shadow is stable. An endotracheal tube, nasogastric catheter and right-sided PICC line are again seen and stable. Bilateral thoracostomy catheters are noted. No pneumothorax is seen. Minimal bibasilar atelectasis  is noted. IMPRESSION: No significant change from the prior exam. Electronically Signed   By: Inez Catalina M.D.   On: 07/20/2016 07:00   Dg Chest Port 1 View  Result Date: 07/19/2016 CLINICAL DATA:  Hypoxia EXAM: PORTABLE CHEST 1 VIEW COMPARISON:  Chest radiograph and chest CT July 17, 2016 FINDINGS: Endotracheal tube tip is 3.3 cm above the carina. Central catheter tip is in the superior vena cava. Nasogastric tube tip and side port are in the stomach. There is a chest tube on the right. No evident pneumothorax. There is patchy atelectatic change in the left base. There is a small pleural effusion on the left. Pleural effusion on the right is smaller. No new opacity is identified. Heart is upper normal in size with pulmonary vascularity within normal limits. No adenopathy. No bone lesions. IMPRESSION: Tube and catheter  positions as described without pneumothorax. Patchy atelectatic change noted in left base. Small pleural effusions bilaterally with effusion on the right smaller compared to 2 days prior. No new opacity evident. Stable cardiac silhouette. Electronically Signed   By: Lowella Grip III M.D.   On: 07/19/2016 08:12    Cardiac Studies   TTE: 3/11  Study Conclusions  - Left ventricle: The cavity size was normal. Wall thickness was   increased in a pattern of mild LVH. Systolic function was normal.   The estimated ejection fraction was in the range of 60% to 65%.   Although no diagnostic regional wall motion abnormality was   identified, this possibility cannot be completely excluded on the   basis of this study. Features are consistent with a pseudonormal   left ventricular filling pattern, with concomitant abnormal   relaxation and increased filling pressure (grade 2 diastolic   dysfunction). - Mitral valve: Calcified annulus. Mildly calcified leaflets .   There was trivial regurgitation. - Right ventricle: The cavity size was mildly dilated. - Right atrium: Central venous pressure (est): 3 mm Hg. - Atrial septum: No defect or patent foramen ovale was identified. - Tricuspid valve: There was trivial regurgitation. - Pericardium, extracardiac: There was a right pleural effusion.  Impressions:  - Overall limited images. Mild LVH with LVEF 60-65%. Probable grade   2 diastolic dysfunction. Calcified mitral annulus with trivial   mitral regurgitation. Aortic valve is moderately calcified and   not well seen. Right ventricle appears mildly dilated. Trivial   tricuspid regurgitation. Right pleural effusion is evident.  Patient Profile     76 y.o. 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 Since admission his rate has been difficult to control but has converted to NSR with IV Amiodarone. Not  a candidate for anticoagulation with undetectable PTLs and active bleeding requiring transfusion of blood and platelets. He has recurrent bilateral pleural effusions and went for thoracentesis 3/14 in preparation for splenectomy 3/15. The thoracentesis was only partially successful as the pt developed hypotension and the procedure had to be aborted. The did have his splenectomy 07/14/15 and is for repeat thoracentesis.   Assessment & Plan    1. AF with RVR vs PSVT- he has been maintaining NSR with no further afib. -- now off IV amiodarone.. Coreg was held on admission. Having intermittent episodes of SVT. Now scheduled for IV metoprolol 2.'5mg'$  q6hr  2. 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  - platelets increasing, now 187  3. Blood loss anemia-  Hgb dropped from 11.2 to 7. Transfused 07/10/16 and stable at 9.1.   4. Pleural effusions- Recurrent pleural effusions this admission - thoracentesis 07/12/16- partially successful-1.2L removed from Lt but then pt became hypotensive.  - repeat thoracentesis with 1600cc of serosanguinous fluid on left and 900cc on right s/p bilateral chest tubes. Chest tubes remain in place with serosanguinous drainage.    5. Acute on chronic diastolic CHF EF 50-27% with LVH and grade 2 DD - swelling much improved   6.  Hypokalemia - replete and keep > 4 per CCM - mg 2.1 at last check  Signed, Reino Bellis, NP  07/20/2016, 10:34 AM    I have examined the patient and reviewed assessment and plan and discussed with patient.  Agree with above as stated.  Now extubated.  He feels better. Start scheduled oral Coreg, which he apparently was on at home, but not listed in home meds.  Maintaining NSR at this time.  Will have to manage fluid status with diuretics given chronic diastolic heart failure.   Larae Grooms

## 2016-07-20 NOTE — Procedures (Signed)
Extubation Procedure Note  Patient Details:   Name: [name] DOB: 05-12-1940 MRN: [id number]   Airway Documentation:     Evaluation  O2 sats: stable throughout Complications: No apparent complications Patient did tolerate procedure well. Bilateral Breath Sounds: Clear, Diminished   Yes  [name] 07/20/2016, 11:01 AM

## 2016-07-20 NOTE — Progress Notes (Signed)
Afternoon rounds  [name] looks great!  Sitting up in bed Fully awake. Shaved earlier.  No distress after extubation  Plan Adv diet Adv activity Cont abx Wean o2 Watch CT output  Erick Colace ACNP-BC [place] Pager # [phone] OR # [phone] if no answer

## 2016-07-20 NOTE — Progress Notes (Signed)
Nutrition Follow-up  DOCUMENTATION CODES:   Not applicable  INTERVENTION:  - Diet advancement as medically feasible. - RD will continue to monitor for needs.  NUTRITION DIAGNOSIS:   Inadequate oral intake related to inability to eat as evidenced by NPO status. -ongoing  GOAL:   Patient will meet greater than or equal to 90% of their needs -unmet at this time.  MONITOR:   Diet advancement, PO intake, Weight trends, Labs, Skin, I & O's  ASSESSMENT:   76 y.o. male with medical history significant of hypertension, hyperlipidemia, diabetes mellitus, COPD, GERD, idiopathic thrombocytopenia, bilateral pleural effusion, bronchiectasis, PSVT, dCHF, who presents with shortness of breath and bilateral leg edema.  3/22 Pt was extubated this AM and OGT removed at that time. Estimated nutrition needs updated based on this event and with respect to POD #7 of lap splenectomy, with massive anasarca with volume overload, per PCCM notes. Weight -7.5 kg from admission. Pt remains NPO at this time.   Medications reviewed; 25 mg albumin QID, 25 mg IV Solu-Cortef/day, sliding scale Novolog, 15 units Novolog every 4 hours, 2 g IV Mg sulfate x1 run today, daily multivitamin with minerals, 40 mg Protonix/day, 40 mEq KCl per OGT/oral x3 doses today.  Labs reviewed; CBGs: 222 and 240 mg/dL today and very limited BMP (only creatinine and GFR) available.    3/20 - OGT in place with pt receiving TF at goal rate: Vital 1.5 @ 40 mL/hr with 60 mL Prostat BID (RD accidentally wrote Osmolite 1.5 in note yesterday but intended for Vital 1.5 which RD did order).  - This regimen is providing 1840 kcal, 125 grams of protein, and 733 mL free water.  - Weight -1.3 kg from yesterday; CBW used in re-estimating kcal need but protein need from yesterday maintained at this time.  - Per PCCM note this AM, vent weaning today, septic shock now resolved.  Patient is currently intubated on ventilator support MV: 11 L/min Temp  (24hrs), Avg:97.9 F (36.6 C), Min:97.6 F (36.4 C), Max:98.4 F (36.9 C) BP: 125/51 and MAP: 77  Medications reviewed; 25 mg albumin QID, 50 mg IV Solu-Crotef BID, sliding scale Novolog, 2 g IV Mg sulfate x1 dose today, 15 mL liquid multivitamin per OGT/day, 40 mg IV Protonix BID, 40 mEq KCl x6 doses per OGT today, 30 mmol IV Na Phos x1 dose today. Labs reviewed; CBGs: 361 and 396 mg/dL this AM, K: 3.4 mmol/L, Phos: 1.7 mg/dL, Mg: WDL, Cl: 96 mmol/L, BUN: 28 mg/dL, Ca: 8.6 mg/dL.  Drips: Amiodarone @ 30 mg/hr, Lasix @ 8 mg/hr.    3/19 - Talked with pt and wife, who is at bedside, and explained RD role and explained TF as pt indicates that he is feeling nervous about what it entails.  - Pt is able to communicate with head nods/shakes and writing.  - He denies nausea or any abdominal pain, except when coughing.  - CBW used in estimating needs but lean body weight likely lower given moderate edema to BUE and severe edema to BLE.  - Physical assessment does not show muscle or fat wasting at this time but may be masked by edema.  - Wife reports BLE edema going on for "awhile" PTA and that pt was being followed by outpatient MD for this.   Patient is currently intubated on ventilator support MV: 10.4L/min Temp (24hrs), Avg:98 F (36.7 C), Min:97.4 F (36.3 C), Max:98.5 F (36.9 C)  BP: 102/46 and MAP: 65 K: 2.9 mmol/L, Mg and Phos WDL  today Drips:Levo @ 2 mcg/min, Amiodarone @ 30 mg/hr, Lasix @ 8 mg/hr.    Diet Order:  Diet NPO time specified  Skin:  Wound (see comment) (Abdominal incision from lap splenectomy 07/13/16)  Last BM:  3/20  Height:   Ht Readings from Last 1 Encounters:  07/14/16 5\' 11"  (1.803 m)    Weight:   Wt Readings from Last 1 Encounters:  07/20/16 184 lb 8.4 oz (83.7 kg)    Ideal Body Weight:  78.18 kg  BMI:  Body mass index is 25.74 kg/m.  Estimated Nutritional Needs:   Kcal:  2090-2260 (25-27 kcal/kg)  Protein:  100-117 grams  (1.2-1.4 grams/kg)  Fluid:  per MD/NP given edema and need for Lasix drip  EDUCATION NEEDS:   No education needs identified at this time    Jarome Matin, MS, RD, LDN, CNSC Inpatient Clinical Dietitian Pager # [phone] After hours/weekend pager # [phone]

## 2016-07-20 NOTE — Progress Notes (Signed)
[facility]:  [phone] General Surgery Progress Note   LOS: 11 days  POD -  7 Days Post-Op  Assessment/Plan: 1.  LAPAROSCOPIC SPLENECTOMY - 07/13/2016 [name]  For ITP  Platelets - 187,000 - 07/20/2016  Continued improvement in platelets  Tolerating tube feedings  2.  Respiratory failure - on vent  Ceftaxidime 3.  Severe COPD 4.  Bilateral chest tubes  L/R - 500/725 cc last 24 hours  CXR looks okay - so basilar atelectasis 5.  DM  Glucose - 240 - 07/20/2016   Principal Problem:   Chronic ITP (idiopathic thrombocytopenia) ([place]), s/p splenectomy on 3/15 Active Problems:   Paroxysmal SVT (supraventricular tachycardia) (HCC)   Chronic diastolic CHF (congestive heart failure) (HCC)   Diabetes mellitus without complication (HCC)   GERD (gastroesophageal reflux disease)   Empyema (HCC)   Ventilator dependence (HCC) d/t Empyema, and volume overload. Complicated further by baseline COPD and Bronchiectasis    Disorders of fluid, electrolyte, and acid-base balance   Anemia in acute on chronic illness   Severe protein-calorie malnutrition (HCC)   Anasarca   Severe muscle deconditioning   Septic shock (HCC)   Acute encephalopathy   Subjective:  Remains intubated.  Alert of vent.  Responds to verbal questions.  Objective:   Vitals:   07/20/16 0600 07/20/16 0700  BP:    Pulse: 89 87  Resp: (!) 23 14  Temp:       Intake/Output from previous day:  03/21 0701 - 03/22 0700 In: 1842.3 [I.V.:282.3; NG/GT:1480; IV Piggyback:50] Out: 4854 [Urine:2000; Chest Tube:1225]  Intake/Output this shift:  No intake/output data recorded.   Physical Exam:   General: WN older WM who is intubated.   HEENT: Normal. Pupils equal. .   Lungs:  Bilateral chest tubes.   Abdomen: Soft.  Has BS.   Wound: Clean.   Lab Results:     Recent Labs  07/19/16 0522 07/20/16 0437  WBC 11.8* 13.4*  HGB 9.3* 9.1*  HCT 29.6* 29.2*  PLT 143* 187    BMET    Recent Labs   07/18/16 0921 07/19/16 0522 07/20/16 0437  NA 141 147*  --   K 3.4* 3.1*  --   CL 96* 98*  --   CO2 38* 39*  --   GLUCOSE 426* 320*  --   BUN 28* 33*  --   CREATININE 0.91 1.01 1.02  CALCIUM 8.6* 8.7*  --     PT/INR  No results for input(s): LABPROT, INR in the last 72 hours.  ABG    Recent Labs  07/20/16 0350  PHART 7.460*  HCO3 35.0*     Studies/Results:  Dg Chest Port 1 View  Result Date: 07/20/2016 CLINICAL DATA:  Follow-up pneumonia, check chest tube status EXAM: PORTABLE CHEST 1 VIEW COMPARISON:  07/19/2016 FINDINGS: Cardiac shadow is stable. An endotracheal tube, nasogastric catheter and right-sided PICC line are again seen and stable. Bilateral thoracostomy catheters are noted. No pneumothorax is seen. Minimal bibasilar atelectasis is noted. IMPRESSION: No significant change from the prior exam. Electronically Signed   By: Inez Catalina M.D.   On: 07/20/2016 07:00   Dg Chest Port 1 View  Result Date: 07/19/2016 CLINICAL DATA:  Hypoxia EXAM: PORTABLE CHEST 1 VIEW COMPARISON:  Chest radiograph and chest CT July 17, 2016 FINDINGS: Endotracheal tube tip is 3.3 cm above the carina. Central catheter tip is in the superior vena cava. Nasogastric tube tip and side port are in the stomach. There is a  chest tube on the right. No evident pneumothorax. There is patchy atelectatic change in the left base. There is a small pleural effusion on the left. Pleural effusion on the right is smaller. No new opacity is identified. Heart is upper normal in size with pulmonary vascularity within normal limits. No adenopathy. No bone lesions. IMPRESSION: Tube and catheter positions as described without pneumothorax. Patchy atelectatic change noted in left base. Small pleural effusions bilaterally with effusion on the right smaller compared to 2 days prior. No new opacity evident. Stable cardiac silhouette. Electronically Signed   By: Lowella Grip III M.D.   On: 07/19/2016 08:12      Anti-infectives:   Anti-infectives    Start     Dose/Rate Route Frequency Ordered Stop   07/16/16 1200  cefTAZidime (FORTAZ) 2 g in dextrose 5 % 50 mL IVPB     2 g 100 mL/hr over 30 Minutes Intravenous Every 8 hours 07/16/16 0942     07/15/16 0600  vancomycin (VANCOCIN) 1,250 mg in sodium chloride 0.9 % 250 mL IVPB  Status:  Discontinued     1,250 mg 166.7 mL/hr over 90 Minutes Intravenous Every 12 hours 07/14/16 1901 07/16/16 0942   07/15/16 0200  piperacillin-tazobactam (ZOSYN) IVPB 3.375 g  Status:  Discontinued     3.375 g 12.5 mL/hr over 240 Minutes Intravenous Every 8 hours 07/14/16 1901 07/16/16 0942   07/14/16 1930  vancomycin (VANCOCIN) 1,500 mg in sodium chloride 0.9 % 500 mL IVPB     1,500 mg 250 mL/hr over 120 Minutes Intravenous  Once 07/14/16 1901 07/14/16 2251   07/14/16 1930  piperacillin-tazobactam (ZOSYN) IVPB 3.375 g     3.375 g 100 mL/hr over 30 Minutes Intravenous  Once 07/14/16 1901 07/14/16 2058      Alphonsa Overall, MD, FACS Pager: [facility]: [phone] 07/20/2016

## 2016-07-20 NOTE — Progress Notes (Signed)
Date:  July 20, 2016 Chart reviewed for concurrent status and case management needs. Will continue to follow patient progress./remains of full vent support.  Discussed case at [facility] for continued stay. Discharge Planning: following for needs Expected discharge date: [id number] Velva Harman, BSN, [place], [place]

## 2016-07-21 ENCOUNTER — Inpatient Hospital Stay (HOSPITAL_COMMUNITY): Payer: Medicare HMO

## 2016-07-21 DIAGNOSIS — J189 Pneumonia, unspecified organism: Secondary | ICD-10-CM

## 2016-07-21 DIAGNOSIS — I959 Hypotension, unspecified: Secondary | ICD-10-CM

## 2016-07-21 LAB — BASIC METABOLIC PANEL
Anion gap: 5 (ref 5–15)
BUN: 40 mg/dL — ABNORMAL HIGH (ref 6–20)
CALCIUM: 8.8 mg/dL — AB (ref 8.9–10.3)
CO2: 32 mmol/L (ref 22–32)
CREATININE: 0.9 mg/dL (ref 0.61–1.24)
Chloride: 110 mmol/L (ref 101–111)
GFR calc non Af Amer: >60 mL/min (ref >60)
Glucose, Bld: 272 mg/dL — ABNORMAL HIGH (ref 65–99)
Potassium: 3.9 mmol/L (ref 3.5–5.1)
SODIUM: 147 mmol/L — AB (ref 135–145)

## 2016-07-21 LAB — GLUCOSE, CAPILLARY
GLUCOSE-CAPILLARY: 274 mg/dL — AB (ref 65–99)
GLUCOSE-CAPILLARY: 195 mg/dL — AB (ref 65–99)
Glucose-Capillary: 239 mg/dL — ABNORMAL HIGH (ref 65–99)
Glucose-Capillary: 237 mg/dL — ABNORMAL HIGH (ref 65–99)

## 2016-07-21 LAB — PROCALCITONIN: PROCALCITONIN: 0.21 ng/mL

## 2016-07-21 MED ORDER — LORATADINE 10 MG PO TABS
10.0000 mg | ORAL_TABLET | Freq: Every day | ORAL | Status: DC
  Administered 2016-07-21 – 2016-08-02 (×13): 10 mg via ORAL
  Filled 2016-07-21 (×13): qty 1

## 2016-07-21 MED ORDER — FENTANYL CITRATE (PF) 100 MCG/2ML IJ SOLN: 50.0000 ug | INTRAMUSCULAR | Status: DC | PRN

## 2016-07-21 MED ORDER — QUETIAPINE FUMARATE 25 MG PO TABS
50.0000 mg | ORAL_TABLET | Freq: Every day | ORAL | Status: DC
  Administered 2016-07-22 – 2016-07-24 (×3): 50 mg
  Filled 2016-07-21: qty 1
  Filled 2016-07-21: qty 2
  Filled 2016-07-21: qty 1

## 2016-07-21 NOTE — Evaluation (Signed)
Physical Therapy Evaluation Patient Details Name: [name] MRN: [id number] DOB: Nov 16, 1940 Today's Date: 07/21/2016   History of Present Illness  [name] is a 76 y.o. male with medical history significant of recently diagnosed with ITP, bronchiectasis, hypertension, diabetes mellitus type 2, afib,Presented to the ED 3/10/18complaining of shortness of breath. 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 Complicated further by baseline COPD and Bronchiectasis . Extubated 07/20/16. Bilateral chest tubes in place.  Clinical Impression  The patient is quite weak but able to stand and take a few steps to the recliner with 2 assist and RW. Oxygen saturation dropped to 87% on 1 liter, increased to 2 L. By RN with sats increased to 94%. RR 33, BP 109/50. Pt admitted with above diagnosis. Pt currently with functional limitations due to the deficits listed below (see PT Problem List). Pt will benefit from skilled PT to increase their independence and safety with mobility to allow discharge to the venue listed below.         Follow Up Recommendations SNF;Supervision/Assistance - 24 hour    Equipment Recommendations  None recommended by PT    Recommendations for Other Services       Precautions / Restrictions Precautions Precautions: Fall Precaution Comments: bil chest tubes , ck with RN if  OK to disconnect from wall suction       Mobility  Bed Mobility Overal bed mobility: Needs Assistance Bed Mobility: Supine to Sit     Supine to sit: Min assist     General bed mobility comments: cues for safety, cues to avoid chest tubes  Transfers   Equipment used: Rolling walker (2 wheeled) Transfers: Sit to/from [facility] Sit to Stand: Mod assist;+2 physical assistance;+2 safety/equipment Stand pivot transfers: Mod assist;+2 physical assistance;+2 safety/equipment       General transfer comment: assist  to rise and steady, knees slightly flexed. Assist with RW to pivot step to recliner.   Ambulation/Gait                Stairs            Wheelchair Mobility    Modified Rankin (Stroke Patients Only)       Balance Overall balance assessment: Needs assistance Sitting-balance support: No upper extremity supported;Feet supported Sitting balance-Leahy Scale: Fair     Standing balance support: Bilateral upper extremity supported;During functional activity Standing balance-Leahy Scale: Poor                               Pertinent Vitals/Pain Pain Assessment: Faces Faces Pain Scale: Hurts even more Pain Location: chest tubes Pain Descriptors / Indicators: Discomfort Pain Intervention(s): Monitored during session    Home Living Family/patient expects to be discharged to:: Private residence Living Arrangements: Spouse/significant other Available Help at Discharge: Family Type of Home: House Home Access: Level entry     Home Layout: Two level;Able to live on main level with bedroom/bathroom Home Equipment: Kasandra Knudsen - single point;Walker - 2 wheels;Shower seat Additional Comments: adjustable bed is upstairs; pt walking 2-3 miles a day until a few months ago(from anther encounter)    Prior Function Level of Independence: Independent               Hand Dominance        Extremity/Trunk Assessment   Upper Extremity Assessment Upper Extremity Assessment: Generalized weakness    Lower Extremity Assessment Lower Extremity Assessment:  Generalized weakness       Communication   Communication: No difficulties  Cognition Arousal/Alertness: Awake/alert Behavior During Therapy: Flat affect Overall Cognitive Status: Within Functional Limits for tasks assessed                                 General Comments: slow to respond at times.      General Comments      Exercises     Assessment/Plan    PT Assessment Patient needs  continued PT services  PT Problem List Decreased strength;Decreased activity tolerance;Decreased balance;Decreased mobility;Cardiopulmonary status limiting activity;Decreased knowledge of precautions;Decreased safety awareness;Decreased knowledge of use of DME;Decreased cognition;Pain       PT Treatment Interventions DME instruction;Gait training;Functional mobility training;Therapeutic activities;Therapeutic exercise;Patient/family education    PT Goals (Current goals can be found in the Care Plan section)  Acute Rehab PT Goals Patient Stated Goal: to get to toilet PT Goal Formulation: With patient/family Time For Goal Achievement: 08/04/16 Potential to Achieve Goals: Good    Frequency Min 3X/week   Barriers to discharge        Co-evaluation               End of Session Equipment Utilized During Treatment: Oxygen Activity Tolerance: Patient tolerated treatment well;Patient limited by fatigue Patient left: in chair;with call bell/phone within reach;with chair alarm set;with nursing/sitter in room;with family/visitor present Nurse Communication: Mobility status PT Visit Diagnosis: Other abnormalities of gait and mobility (R26.89);Muscle weakness (generalized) (M62.81);Pain Pain - Right/Left: Left    Time: 3754-3606 PT Time Calculation (min) (ACUTE ONLY): 20 min   Charges:   PT Evaluation $PT Eval Moderate Complexity: 1 Procedure     PT G CodesClaretha Cooper 07/21/2016, 2:01 PM  Tresa Endo PT [phone]

## 2016-07-21 NOTE — Progress Notes (Signed)
[facility] Office:  [phone] General Surgery Progress Note   LOS: 12 days  POD -  8 Days Post-Op  Assessment/Plan: 1.  LAPAROSCOPIC SPLENECTOMY - 07/13/2016 Barry Dienes  For ITP  Platelets - 187,000 - 07/20/2016  Continued improvement in platelets  On regular diet - just started  2.  Question right middle lobe pneumonia on today's CXR  Extubated 07/20/2016  Ceftaxidime 3.  Severe COPD 4.  Bilateral chest tubes  L/R - 620/595 cc last 24 hours 5.  DM  Glucose - 240 - 07/20/2016 6.  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 Complicated further by baseline COPD and Bronchiectasis    Disorders of fluid, electrolyte, and acid-base balance   Anemia in acute on chronic illness   Severe protein-calorie malnutrition (HCC)   Anasarca   Severe muscle deconditioning   Septic shock (HCC)   Acute encephalopathy   Subjective:  Extubated.  Looks good.  No specific complaint  Objective:   Vitals:   07/21/16 0740 07/21/16 0741  BP:  (!) 120/58  Pulse: 99 (!) 101  Resp: (!) 21 19  Temp:  98.1 F (36.7 C)     Intake/Output from previous day:  03/22 0701 - 03/23 0700 In: 1090 [P.O.:820; NG/GT:120; IV Piggyback:150] Out: 3240 [Urine:2025; Chest Tube:1215]  Intake/Output this shift:  Total I/O In: 240 [P.O.:240] Out: 150 [Urine:150]   Physical Exam:   General: WN older WM who is alert.  A little SOB, but conversant.   HEENT: Normal. Pupils equal. .   Lungs:  Bilateral chest tubes.   Abdomen: Soft.  Has BS.   Wound: Clean.   Lab Results:     Recent Labs  07/19/16 0522 07/20/16 0437   WBC 11.8* 13.4*  HGB 9.3* 9.1*  HCT 29.6* 29.2*  PLT 143* 187    BMET    Recent Labs  07/20/16 1001 07/21/16 0511  NA 149* 147*  K 3.1* 3.9  CL 108 110  CO2 34* 32  GLUCOSE 237* 272*  BUN 41* 40*  CREATININE 0.90 0.90  CALCIUM 9.0 8.8*    PT/INR  No results for input(s): LABPROT, INR in the last 72 hours.  ABG    Recent Labs  07/20/16 0350  PHART 7.460*  HCO3 35.0*     Studies/Results:  Dg Chest Port 1 View  Result Date: 07/21/2016 CLINICAL DATA:  Recent pneumonia EXAM: PORTABLE CHEST 1 VIEW COMPARISON:  July 20, 2016 FINDINGS: Endotracheal tube and nasogastric tube have been removed. Central catheter tip is in the superior vena cava near the cavoatrial junction. Chest tubes are noted bilaterally. There is no appreciable pneumothorax. There are small pleural effusions bilaterally, slightly larger on the left than on the right. There is mild bibasilar atelectasis. There is new patchy infiltrate in the right mid lung, concerning for early pneumonia in this area. Heart is mildly enlarged with pulmonary vascularity within normal limits. There is atherosclerotic calcification in the aorta. No adenopathy. No bone lesions. IMPRESSION: Patchy infiltrate right mid lung. Suspect early pneumonia. Small pleural effusions with bibasilar atelectasis present with chest tubes present bilaterally. No pneumothorax. There is aortic atherosclerosis. No change in central catheter  position. Electronically Signed   By: Lowella Grip III M.D.   On: 07/21/2016 07:16   Dg Chest Port 1 View  Result Date: 07/20/2016 CLINICAL DATA:  Follow-up pneumonia, check chest tube status EXAM: PORTABLE CHEST 1 VIEW COMPARISON:  07/19/2016 FINDINGS: Cardiac shadow is stable. An endotracheal tube, nasogastric catheter and right-sided PICC line are again seen and stable. Bilateral thoracostomy catheters are noted. No pneumothorax is seen. Minimal bibasilar atelectasis is noted. IMPRESSION: No significant change  from the prior exam. Electronically Signed   By: Inez Catalina M.D.   On: 07/20/2016 07:00     Anti-infectives:   Anti-infectives    Start     Dose/Rate Route Frequency Ordered Stop   07/16/16 1200  cefTAZidime (FORTAZ) 2 g in dextrose 5 % 50 mL IVPB     2 g 100 mL/hr over 30 Minutes Intravenous Every 8 hours 07/16/16 0942     07/15/16 0600  vancomycin (VANCOCIN) 1,250 mg in sodium chloride 0.9 % 250 mL IVPB  Status:  Discontinued     1,250 mg 166.7 mL/hr over 90 Minutes Intravenous Every 12 hours 07/14/16 1901 07/16/16 0942   07/15/16 0200  piperacillin-tazobactam (ZOSYN) IVPB 3.375 g  Status:  Discontinued     3.375 g 12.5 mL/hr over 240 Minutes Intravenous Every 8 hours 07/14/16 1901 07/16/16 0942   07/14/16 1930  vancomycin (VANCOCIN) 1,500 mg in sodium chloride 0.9 % 500 mL IVPB     1,500 mg 250 mL/hr over 120 Minutes Intravenous  Once 07/14/16 1901 07/14/16 2251   07/14/16 1930  piperacillin-tazobactam (ZOSYN) IVPB 3.375 g     3.375 g 100 mL/hr over 30 Minutes Intravenous  Once 07/14/16 1901 07/14/16 2058      Alphonsa Overall, MD, FACS Pager: [facility] Office: [phone] 07/21/2016

## 2016-07-21 NOTE — Progress Notes (Signed)
PULMONARY / CRITICAL CARE MEDICINE   Name: [name] MRN: [id number] DOB: [birth year]    ADMISSION DATE:  07/08/2016 CONSULTATION DATE: 3/15  REFERRING MD:  Barry Dienes   CHIEF COMPLAINT:  Vent management    SUBJECTIVE:  No distress. Wants to get OOB  VITAL SIGNS: BP (!) 120/58   Pulse 100   Temp 98.1 F (36.7 C) (Oral)   Resp 19   Ht '5\' 11"'$  (1.803 m)   Wt 180 lb 12.4 oz (82 kg)   SpO2 96%   BMI 25.21 kg/m  [place] 2 liters HEMODYNAMICS:    VENTILATOR SETTINGS: FiO2 (%):  [30 %] 30 %  INTAKE / OUTPUT:  Intake/Output Summary (Last 24 hours) at 07/21/16 0944 Last data filed at 07/21/16 0900  Gross per 24 hour  Intake             1530 ml  Output             3040 ml  Net            -1510 ml   General appearance:  76 Year old  Male  NAD  conversant  Eyes: anicteric sclerae moist conjunctivae; PERRL, EOMI bilaterally. Mouth:  membranes and no mucosal ulcerations; normal hard and soft palate Neck: Trachea midline; neck supple, no JVD Lungs/chest: diffuse rhonchi bilaterally but with normal respiratory effort and no intercostal retractions. Bilateral CT output clearing once again. No air-leak  CV: RRR, no MRGs  Abdomen: Soft, non-tender; no masses or HSM Extremities: minimal ankle edema or extremity lymphadenopathy Skin: Normal temperature, turgor and texture; no rash, ulcers or subcutaneous nodules Psych: Appropriate affect, alert and oriented to person, place and time   LABS:  BMET  Recent Labs Lab 07/19/16 0522 07/20/16 0437 07/20/16 1001 07/21/16 0511  NA 147*  --  149* 147*  K 3.1*  --  3.1* 3.9  CL 98*  --  108 110  CO2 39*  --  34* 32  BUN 33*  --  41* 40*  CREATININE 1.01 1.02 0.90 0.90  GLUCOSE 320*  --  237* 272*    Electrolytes  Recent Labs Lab 07/17/16 1054  07/18/16 0344 07/18/16 0921 07/18/16 1700 07/19/16 0522 07/20/16 1001 07/21/16 0511  CALCIUM 8.1*  < >  --  8.6*  --  8.7* 9.0 8.8*  MG 1.8  < > 2.0 1.9 2.1  --   --   --   PHOS  2.9  --  1.7*  --  2.9  --   --   --   < > = values in this interval not displayed. CBC  Recent Labs Lab 07/18/16 0344 07/19/16 0522 07/20/16 0437  WBC 13.2* 11.8* 13.4*  HGB 8.9* 9.3* 9.1*  HCT 28.3* 29.6* 29.2*  PLT 105* 143* 187   Coag's No results for input(s): APTT, INR in the last 168 hours. Sepsis Markers No results for input(s): LATICACIDVEN, PROCALCITON, O2SATVEN in the last 168 hours.  ABG  Recent Labs Lab 07/16/16 0500 07/17/16 0500 07/20/16 0350  PHART 7.574* 7.576* 7.460*  PCO2ART 40.5 40.3 49.6*  PO2ART 72.8* 96.7 71.8*   Liver Enzymes  Recent Labs Lab 07/16/16 0310 07/17/16 0329  AST 16 18  ALT 9* 12*  ALKPHOS 66 59  BILITOT 0.8 0.9  ALBUMIN 1.7* 2.4*   Cardiac Enzymes No results for input(s): TROPONINI, PROBNP in the last 168 hours. Glucose  Recent Labs Lab 07/20/16 0334 07/20/16 0842 07/20/16 1148 07/20/16 1718 07/20/16 2122 07/21/16 0808  GLUCAP 240*  222* 150* 332* 307* 274*   Imaging Dg Chest Port 1 View  Result Date: 07/21/2016 CLINICAL DATA:  Recent pneumonia EXAM: PORTABLE CHEST 1 VIEW COMPARISON:  July 20, 2016 FINDINGS: Endotracheal tube and nasogastric tube have been removed. Central catheter tip is in the superior vena cava near the cavoatrial junction. Chest tubes are noted bilaterally. There is no appreciable pneumothorax. There are small pleural effusions bilaterally, slightly larger on the left than on the right. There is mild bibasilar atelectasis. There is new patchy infiltrate in the right mid lung, concerning for early pneumonia in this area. Heart is mildly enlarged with pulmonary vascularity within normal limits. There is atherosclerotic calcification in the aorta. No adenopathy. No bone lesions. IMPRESSION: Patchy infiltrate right mid lung. Suspect early pneumonia. Small pleural effusions with bibasilar atelectasis present with chest tubes present bilaterally. No pneumothorax. There is aortic atherosclerosis. No change  in central catheter position. Electronically Signed   By: Lowella Grip III M.D.   On: 07/21/2016 07:16  small right patchy infiltrates on right. Effusions minimal (personally reviewed)    STUDIES/events Splenectomy 3/15.  3/15 self extubaetd Right thora 3/16.Marland Kitchen 800 clearly exudate. Appears like old blood Pleural cytology 3/16>>> 3/16 bilateral chest tubes placed d/t old bloody appearing effusions w/ significant fibrinous debris noted on [place].  3/19:Pleural fluid grew Pseudomonas therefore this was EMPYEMA. CXR showing fluid has now filled loculated spaces where had loculated PTX after tube placement. Massive Anasarca. Now weaning some. Starting lasix gtt. CT chest.  CT chest 3/19: Partially loculated moderate bilateral hydropneumothoraces with bilateral chest tubes in place. Bilateral lower lobe airspace opacities. Ground-glass nodular opacities in the right upper lobe and lingula. Findings concerning for pneumonia. (personally reviewed by PB 3/20) 3/20: started pleural TPA/DNase. Developed delirium and to some degree paranoia. Placed on Seroquel and precedex.  3/21: 3.6 liters down since starting diuresis. Chest tubes put out a liter over 24hrs not counting loss around insertion site. Day #2/3 TPA/DNase. Amio stopped, lasix stopped d/t met alk.  3/22 SBT started. Day #3/3 TPA/DNase. Looks great! Extubated 3/23 awake, Eating. Getting PT consult. Aline removed.   CULTURES: Pleural fluid 3/16>>>Pseudomonas (pan sens) Blood cultures 3/16>>>Negative   ANTIBIOTICS: vanc 3/16>>>off  Zosyn 3/16>>>3/18 Ceftaz 3/18>>>    LINES/TUBES: OETT 3/15>>>3/16 (self extubated) ETT 3/16>>> Bilateral Chest tubes 3/16 R PICC 3/17>>>  ASSESSMENT / PLAN:  Chronic ITP (idiopathic thrombocytopenia) ([place]), s/p splenectomy on 3/15  -now resolved and stable Plan Repeat CBC every other day [place] heparin  s/p Ventilator dependence ([place]) d/t Empyema, and volume overload. Complicated further by baseline  COPD and Bronchiectasis  -successfully extubated 3/23 -weaning O2 -CXR w/ some patchy changes on right Plan Cont BDs Wean O2 Mobilize Add IS and flutter  Empyema (HCC) -pseudomonas w/ bilateral loculations. S/p bilateral CTs.  -output is clearing and decreasing Plan Trend output  May be able to dc CTs this weekend Cont day 6/14 ceftaz  Paroxysmal SVT (supraventricular tachycardia) (HCC)->briefly in AF yesterday (3/22). Now back in [place] coreg back Cont tele  Intermittent hypotension -resolved.  Plan Dc solucortef Cont evem volume status  Chronic diastolic CHF (congestive heart failure) (HCC) -appears euvolemic Plan Hold diuresis today Add coreg   Disorders of fluid, electrolyte, and acid-base balance -Hypernatremia improved Plan Holding diuresis Allow for oral free water intake  Anasarca-->treated w/72 hrs of albumin and lasix. Essentially resolved w/ just trace edema in ankles.  Plan PRN lasix (hold for now)  Diabetes mellitus without complication (HCC) w/ hyperglycemia Plan  Dc solucortef ssi  GERD (gastroesophageal reflux disease) Plan PPI  Anemia in acute on chronic illness Plan Cbc 3/24 Transfuse as needed  Severe protein-calorie malnutrition (HCC) Plan Encourage PO intake Added in-between protein shakes  Severe muscle deconditioning Plan PT and OT     Discussion Looks fantastic Does have some patchy infiltrate on right. Hope just some transient mucous plugging Output from chest tubes decreased and clearing For today Mobilize Add flutter Cont abx Hope over weekend we can dc some CTs  Erick Colace ACNP-BC [place] Pager # [phone] OR # [phone] if no answer  07/21/2016, 9:44 AM

## 2016-07-21 NOTE — Progress Notes (Signed)
HEMATOLOGY-ONCOLOGY PROGRESS NOTE  SUBJECTIVE: Patient is sitting in bed, awake and talkative  OBJECTIVE: REVIEW OF SYSTEMS:   Not obtained  PHYSICAL EXAMINATION: ECOG PERFORMANCE STATUS: 4 - Bedbound  Vitals:   07/21/16 0741 07/21/16 0841  BP: (!) 120/58 (!) 120/58  Pulse: (!) 101   Resp: 19 20  Temp: 98.1 F (36.7 C)    Filed Weights   07/19/16 0140 07/20/16 0500 07/21/16 0500  Weight: 184 lb 8.4 oz (83.7 kg) 184 lb 8.4 oz (83.7 kg) 180 lb 12.4 oz (82 kg)    GENERAL: Awake in bed SKIN: skin color, texture, turgor are normal, no rashes or significant lesions LUNGS: Diminished breath sounds in the bases HEART: regular rate & rhythm and no murmurs  ABDOMEN:abdomen soft, non-tender, normoactive bowel sounds Musculoskeletal:no cyanosis of digits and no clubbing  NEURO: Answers questions appropriately, moves all extremities well.  LABORATORY DATA:  I have reviewed the data as listed CMP Latest Ref Rng & Units 07/21/2016 07/20/2016 07/20/2016  Glucose 65 - 99 mg/dL 272(H) 237(H) -  BUN 6 - 20 mg/dL 40(H) 41(H) -  Creatinine 0.61 - 1.24 mg/dL 0.90 0.90 1.02  Sodium 135 - 145 mmol/L 147(H) 149(H) -  Potassium 3.5 - 5.1 mmol/L 3.9 3.1(L) -  Chloride 101 - 111 mmol/L 110 108 -  CO2 22 - 32 mmol/L 32 34(H) -  Calcium 8.9 - 10.3 mg/dL 8.8(L) 9.0 -  Total Protein 6.5 - 8.1 g/dL - - -  Total Bilirubin 0.3 - 1.2 mg/dL - - -  Alkaline Phos 38 - 126 U/L - - -  AST 15 - 41 U/L - - -  ALT 17 - 63 U/L - - -    Lab Results  Component Value Date   WBC 13.4 (H) 07/20/2016   HGB 9.1 (L) 07/20/2016   HCT 29.2 (L) 07/20/2016   MCV 89.0 07/20/2016   PLT 187 07/20/2016   NEUTROABS 11.7 (H) 07/20/2016    ASSESSMENT AND PLAN: 1. Acute ITP: Relapsed refractory status post splenectomy today's platelet count is 187. 2. Patient continues with bilateral chest tubes, that are draining 3. Severe COPD, extubated and advancing diet as tolerated 4. On broad-spectrum antibiotics for Pseudomonas,  and ? Pneumonia on xray today.   5. Advancing activity as tolerated.  Please call Gardenia Phlegm,  NP if any hematology issues arise [phone].  Gardenia Phlegm, NP

## 2016-07-21 NOTE — Progress Notes (Addendum)
Inpatient Diabetes Program Recommendations  AACE/ADA: New Consensus Statement on Inpatient Glycemic Control (2015)  Target Ranges:  Prepandial:   less than 140 mg/dL      Peak postprandial:   less than 180 mg/dL (1-2 hours)      Critically ill patients:  140 - 180 mg/dL   Review of Glycemic Control  Diabetes history: DM 2 Outpatient Diabetes medications: Toujeo 30 units Daily, Metformin 1000 mg BID Current orders for Inpatient glycemic control: Novolog 0-5 units tid  A1c 6.6% on 07/09/16  Inpatient Diabetes Program Recommendations:   Patient extubated and eating 75% of meals. Glucose in the 200-300 range.   - Please consider discontinuing current insulin orders and order the glycemic control order set Novolog Moderate Correction tid + Novolog hs scale   - Consider adding back basal insulin, Lantus 8 units (0.1unit/kg)  Thanks,  Tama Headings RN, MSN, [facility] Inpatient Diabetes Coordinator Team Pager [phone] (8a-5p)

## 2016-07-21 NOTE — Progress Notes (Signed)
Progress Note  Patient Name: [name] Date of Encounter: 07/21/2016  Primary Cardiologist: Dr. Haroldine Laws Dr. Tamala Julian  Subjective   Extubated yesterday afternoon. Denies any chest pain or palpitations. Breathing improved. Reports his abdomen is sore following recent surgery.   Inpatient Medications    Scheduled Meds: . atorvastatin  40 mg Per Tube q1800  . budesonide (PULMICORT) nebulizer solution  0.25 mg Nebulization BID  . carvedilol  3.125 mg Oral BID WC  . cefTAZidime (FORTAZ)  IV  2 g Intravenous Q8H  . Chlorhexidine Gluconate Cloth  6 each Topical Daily  . heparin subcutaneous  5,000 Units Subcutaneous Q8H  . hydrocortisone sodium succinate  25 mg Intravenous Daily  . insulin aspart  0-5 Units Subcutaneous TID AC & HS  . ipratropium  0.5 mg Nebulization Q6H  . lactose free nutrition  237 mL Oral TID BM  . levalbuterol  1.25 mg Nebulization Q6H  . mouth rinse  15 mL Mouth Rinse BID  . multivitamin with minerals  1 tablet Oral Daily  . pantoprazole sodium  40 mg Per Tube Daily  . QUEtiapine  50 mg Per Tube BID  . sodium chloride flush  10-40 mL Intracatheter Q12H   Continuous Infusions: . norepinephrine (LEVOPHED) Adult infusion Stopped (07/18/16 2000)   PRN Meds: acetaminophen, bisacodyl, fentaNYL (SUBLIMAZE) injection, fentaNYL (SUBLIMAZE) injection, fentaNYL (SUBLIMAZE) injection, levalbuterol, metoprolol, sodium chloride flush   Vital Signs    Vitals:   07/21/16 0600 07/21/16 0700 07/21/16 0740 07/21/16 0741  BP:    (!) 120/58  Pulse: 89 98 99 (!) 101  Resp: (!) 25 (!) 25 (!) 21 19  Temp:      TempSrc:      SpO2:    93%  Weight:      Height:        Intake/Output Summary (Last 24 hours) at 07/21/16 0756 Last data filed at 07/21/16 0558  Gross per 24 hour  Intake             1090 ml  Output             3240 ml  Net            -2150 ml   Filed Weights   07/19/16 0140 07/20/16 0500 07/21/16 0500  Weight: 184 lb 8.4 oz (83.7 kg) 184 lb 8.4 oz  (83.7 kg) 180 lb 12.4 oz (82 kg)    Telemetry    NSR, HR in 80's - 90's. Episodes of sinus tach with HR peaking in the 110's.  - Personally Reviewed  ECG    No new tracings.   Physical Exam   General: Well developed, well nourished Caucasian male appearing in no acute distress. Head: Normocephalic, atraumatic.  Neck: Supple without bruits, JVD not elevated. Lungs:  Resp regular and unlabored, scattered rales along bases. Chest tubes in place. Heart: RRR, S1, S2, no S3, S4, or murmur; no rub. Abdomen: Soft, non-tender, non-distended with normoactive bowel sounds. No hepatomegaly. No rebound/guarding. No obvious abdominal masses. Extremities: No clubbing, cyanosis, or edema. Distal pedal pulses are 2+ bilaterally. Neuro: Alert and oriented X 3. Moves all extremities spontaneously. Psych: Normal affect.  Labs    Chemistry Recent Labs Lab 07/16/16 0310  07/17/16 0329  07/19/16 0522 07/20/16 0437 07/20/16 1001 07/21/16 0511  NA 139  < > 137  < > 147*  --  149* 147*  K 2.7*  < > 2.9*  < > 3.1*  --  3.1* 3.9  CL 94*  < >  93*  < > 98*  --  108 110  CO2 39*  < > 36*  < > 39*  --  34* 32  GLUCOSE 130*  < > 237*  < > 320*  --  237* 272*  BUN 26*  < > 22*  < > 33*  --  41* 40*  CREATININE 0.89  < > 0.80  < > 1.01 1.02 0.90 0.90  CALCIUM 8.0*  < > 7.9*  < > 8.7*  --  9.0 8.8*  PROT 4.9*  --  5.3*  --   --   --   --   --   ALBUMIN 1.7*  --  2.4*  --   --   --   --   --   AST 16  --  18  --   --   --   --   --   ALT 9*  --  12*  --   --   --   --   --   ALKPHOS 66  --  59  --   --   --   --   --   BILITOT 0.8  --  0.9  --   --   --   --   --   GFRNONAA >60  < > >60  < > >60 >60 >60 >60  GFRAA >60  < > >60  < > >60 >60 >60 >60  ANIONGAP 6  < > 8  < > 10  --  7 5  < > = values in this interval not displayed.   Hematology Recent Labs Lab 07/18/16 0344 07/19/16 0522 07/20/16 0437  WBC 13.2* 11.8* 13.4*  RBC 3.34* 3.40* 3.28*  HGB 8.9* 9.3* 9.1*  HCT 28.3* 29.6* 29.2*  MCV  84.7 87.1 89.0  MCH 26.6 27.4 27.7  MCHC 31.4 31.4 31.2  RDW 16.4* 16.5* 16.9*  PLT 105* 143* 187    Cardiac EnzymesNo results for input(s): TROPONINI in the last 168 hours. No results for input(s): TROPIPOC in the last 168 hours.   BNP Recent Labs Lab 07/15/16 0900  BNP 95.6     DDimer No results for input(s): DDIMER in the last 168 hours.   Radiology    Dg Chest Port 1 View  Result Date: 07/21/2016 CLINICAL DATA:  Recent pneumonia EXAM: PORTABLE CHEST 1 VIEW COMPARISON:  July 20, 2016 FINDINGS: Endotracheal tube and nasogastric tube have been removed. Central catheter tip is in the superior vena cava near the cavoatrial junction. Chest tubes are noted bilaterally. There is no appreciable pneumothorax. There are small pleural effusions bilaterally, slightly larger on the left than on the right. There is mild bibasilar atelectasis. There is new patchy infiltrate in the right mid lung, concerning for early pneumonia in this area. Heart is mildly enlarged with pulmonary vascularity within normal limits. There is atherosclerotic calcification in the aorta. No adenopathy. No bone lesions. IMPRESSION: Patchy infiltrate right mid lung. Suspect early pneumonia. Small pleural effusions with bibasilar atelectasis present with chest tubes present bilaterally. No pneumothorax. There is aortic atherosclerosis. No change in central catheter position. Electronically Signed   By: Lowella Grip III M.D.   On: 07/21/2016 07:16   Dg Chest Port 1 View  Result Date: 07/20/2016 CLINICAL DATA:  Follow-up pneumonia, check chest tube status EXAM: PORTABLE CHEST 1 VIEW COMPARISON:  07/19/2016 FINDINGS: Cardiac shadow is stable. An endotracheal tube, nasogastric catheter and right-sided PICC line are again seen and stable. Bilateral thoracostomy  catheters are noted. No pneumothorax is seen. Minimal bibasilar atelectasis is noted. IMPRESSION: No significant change from the prior exam. Electronically Signed   By:  Inez Catalina M.D.   On: 07/20/2016 07:00    Cardiac Studies   Echocardiogram: 07/09/2016 Study Conclusions  - Left ventricle: The cavity size was normal. Wall thickness was   increased in a pattern of mild LVH. Systolic function was normal.   The estimated ejection fraction was in the range of 60% to 65%.   Although no diagnostic regional wall motion abnormality was   identified, this possibility cannot be completely excluded on the   basis of this study. Features are consistent with a pseudonormal   left ventricular filling pattern, with concomitant abnormal   relaxation and increased filling pressure (grade 2 diastolic   dysfunction). - Mitral valve: Calcified annulus. Mildly calcified leaflets .   There was trivial regurgitation. - Right ventricle: The cavity size was mildly dilated. - Right atrium: Central venous pressure (est): 3 mm Hg. - Atrial septum: No defect or patent foramen ovale was identified. - Tricuspid valve: There was trivial regurgitation. - Pericardium, extracardiac: There was a right pleural effusion.  Impressions:  - Overall limited images. Mild LVH with LVEF 60-65%. Probable grade   2 diastolic dysfunction. Calcified mitral annulus with trivial   mitral regurgitation. Aortic valve is moderately calcified and   not well seen. Right ventricle appears mildly dilated. Trivial   tricuspid regurgitation. Right pleural effusion is evident.  Patient Profile     76 y.o. male w/ PMH of HTN, HLD, Type 2 DM, history of pSVT,and ITP admitted on 07/08/16 with recurrent PSVT and hypotension-required DCCV in ED. Since admission his rate has been difficult to control but has converted to NSR with IV Amiodarone. Not a candidate for anticoagulation with undetectable PTLs and active bleeding requiring transfusion of blood and platelets. He has recurrent bilateral pleural effusions and went for thoracentesis 3/14 in preparation for splenectomy on 3/15.  Assessment & Plan      1. Atrial Fibrillation with RVR vs. PSVT - he has been maintaining NSR with no further afib. Not a candidate for anticoagulation with severe ITP. - now off IV Amiodarone. Coreg was held on admission secondary to hypotension.  - keep K+ ~ 4.0 and Mg > 2.0. - had brief episodes of sinus tachycardia overnight with HR peaking in the 110's. Has been restarted on Coreg 3.125mg  BID with BP tolerating this well.  2. Severe ITP s/p splenectomy - Pt being followed by Hematology - s/p splenectomy 07/13/16.  - platelets were < 5 on admission, now improved to 187 on 3/22.  3. Blood loss anemia - Hgb dropped from 11.2 to 7. Transfused 07/10/16 and with Hgb stable at 9.1 on 3/22.  4. Pleural effusions - Recurrent pleural effusions this admission - thoracentesis 07/12/16- partially successful-1.2L removed from Lt but then pt became hypotensive.  - repeat thoracentesis with 1600cc of serosanguinous fluid on left and 900cc on right s/p bilateral chest tubes. Chest tubes remain in place with serosanguinous drainage.    5. Acute on chronic diastolic CHF - echo this admission shows a preserved EF of 60-65% with LVH and grade 2 DD. - weight was 201 lbs on admission, at 180 lbs this AM. Recorded output of -5.5L this admission.  - he does not appear overly volume overloaded on physical examination.   6. Hypokalemia - resolved. K+ 3.9 this AM. Keep ~ 4.0.   Signed, Erma Heritage , PA-C  7:56 AM 07/21/2016 Pager: [phone]   I have examined the patient and reviewed assessment and plan and discussed with patient.  Agree with above as stated.  Maintaining NSR.  COntinue Coreg.  BP better controlled today.  He has diuresed well over the past few days.   Larae Grooms

## 2016-07-21 NOTE — Progress Notes (Signed)
[facility] Office:  [phone] General Surgery Progress Note   LOS: 12 days  POD -  8 Days Post-Op  Assessment/Plan: 1.  LAPAROSCOPIC SPLENECTOMY - 07/13/2016 Barry Dienes  For ITP  Platelets - 187,000 - 07/20/2016  Continued improvement in platelets  Tolerating tube feedings  2.  Respiratory failure - on vent  Ceftaxidime 3.  Severe COPD 4.  Bilateral chest tubes  L/R - 500/725 cc last 24 hours  CXR looks okay - so basilar atelectasis 5.  DM  Glucose - 272 - 07/21/2016 6.  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 Complicated further by baseline COPD and Bronchiectasis    Disorders of fluid, electrolyte, and acid-base balance   Anemia in acute on chronic illness   Severe protein-calorie malnutrition (HCC)   Anasarca   Severe muscle deconditioning   Septic shock (HCC)   Acute encephalopathy   Subjective:  Remains intubated.  Alert of vent.  Responds to verbal questions.  Objective:   Vitals:   07/21/16 0740 07/21/16 0741  BP:  (!) 120/58  Pulse: 99 (!) 101  Resp: (!) 21 19  Temp:  98.1 F (36.7 C)     Intake/Output from previous day:  03/22 0701 - 03/23 0700 In: 1090 [P.O.:820; NG/GT:120; IV Piggyback:150] Out: 3240 [Urine:2025; Chest Tube:1215]  Intake/Output this shift:  Total I/O In: 240 [P.O.:240] Out: 150 [Urine:150]   Physical Exam:   General: WN older WM who is intubated.   HEENT: Normal. Pupils equal. .   Lungs:  Bilateral chest tubes.   Abdomen: Soft.  Has BS.   Wound: Clean.   Lab Results:     Recent Labs  07/19/16 0522 07/20/16 0437  WBC 11.8*  13.4*  HGB 9.3* 9.1*  HCT 29.6* 29.2*  PLT 143* 187    BMET    Recent Labs  07/20/16 1001 07/21/16 0511  NA 149* 147*  K 3.1* 3.9  CL 108 110  CO2 34* 32  GLUCOSE 237* 272*  BUN 41* 40*  CREATININE 0.90 0.90  CALCIUM 9.0 8.8*    PT/INR  No results for input(s): LABPROT, INR in the last 72 hours.  ABG    Recent Labs  07/20/16 0350  PHART 7.460*  HCO3 35.0*     Studies/Results:  Dg Chest Port 1 View  Result Date: 07/21/2016 CLINICAL DATA:  Recent pneumonia EXAM: PORTABLE CHEST 1 VIEW COMPARISON:  July 20, 2016 FINDINGS: Endotracheal tube and nasogastric tube have been removed. Central catheter tip is in the superior vena cava near the cavoatrial junction. Chest tubes are noted bilaterally. There is no appreciable pneumothorax. There are small pleural effusions bilaterally, slightly larger on the left than on the right. There is mild bibasilar atelectasis. There is new patchy infiltrate in the right mid lung, concerning for early pneumonia in this area. Heart is mildly enlarged with pulmonary vascularity within normal limits. There is atherosclerotic calcification in the aorta. No adenopathy. No bone lesions. IMPRESSION: Patchy infiltrate right mid lung. Suspect early pneumonia. Small pleural effusions with bibasilar atelectasis present with chest tubes present bilaterally. No pneumothorax. There is aortic atherosclerosis. No change in central catheter position. Electronically Signed  By: Lowella Grip III M.D.   On: 07/21/2016 07:16   Dg Chest Port 1 View  Result Date: 07/20/2016 CLINICAL DATA:  Follow-up pneumonia, check chest tube status EXAM: PORTABLE CHEST 1 VIEW COMPARISON:  07/19/2016 FINDINGS: Cardiac shadow is stable. An endotracheal tube, nasogastric catheter and right-sided PICC line are again seen and stable. Bilateral thoracostomy catheters are noted. No pneumothorax is seen. Minimal bibasilar atelectasis is noted. IMPRESSION: No significant change from the  prior exam. Electronically Signed   By: Inez Catalina M.D.   On: 07/20/2016 07:00     Anti-infectives:   Anti-infectives    Start     Dose/Rate Route Frequency Ordered Stop   07/16/16 1200  cefTAZidime (FORTAZ) 2 g in dextrose 5 % 50 mL IVPB     2 g 100 mL/hr over 30 Minutes Intravenous Every 8 hours 07/16/16 0942     07/15/16 0600  vancomycin (VANCOCIN) 1,250 mg in sodium chloride 0.9 % 250 mL IVPB  Status:  Discontinued     1,250 mg 166.7 mL/hr over 90 Minutes Intravenous Every 12 hours 07/14/16 1901 07/16/16 0942   07/15/16 0200  piperacillin-tazobactam (ZOSYN) IVPB 3.375 g  Status:  Discontinued     3.375 g 12.5 mL/hr over 240 Minutes Intravenous Every 8 hours 07/14/16 1901 07/16/16 0942   07/14/16 1930  vancomycin (VANCOCIN) 1,500 mg in sodium chloride 0.9 % 500 mL IVPB     1,500 mg 250 mL/hr over 120 Minutes Intravenous  Once 07/14/16 1901 07/14/16 2251   07/14/16 1930  piperacillin-tazobactam (ZOSYN) IVPB 3.375 g     3.375 g 100 mL/hr over 30 Minutes Intravenous  Once 07/14/16 1901 07/14/16 2058      Alphonsa Overall, MD, FACS Pager: [facility] Office: [phone] 07/21/2016

## 2016-07-22 ENCOUNTER — Inpatient Hospital Stay (HOSPITAL_COMMUNITY): Payer: Medicare HMO

## 2016-07-22 LAB — COMPREHENSIVE METABOLIC PANEL
ALK PHOS: 55 U/L (ref 38–126)
ALT: 11 U/L — ABNORMAL LOW (ref 17–63)
AST: 13 U/L — ABNORMAL LOW (ref 15–41)
Albumin: 2.6 g/dL — ABNORMAL LOW (ref 3.5–5.0)
Anion gap: 7 (ref 5–15)
BUN: 39 mg/dL — ABNORMAL HIGH (ref 6–20)
CALCIUM: 8.7 mg/dL — AB (ref 8.9–10.3)
CO2: 32 mmol/L (ref 22–32)
CREATININE: 0.85 mg/dL (ref 0.61–1.24)
Chloride: 105 mmol/L (ref 101–111)
Glucose, Bld: 227 mg/dL — ABNORMAL HIGH (ref 65–99)
Potassium: 3.8 mmol/L (ref 3.5–5.1)
Sodium: 144 mmol/L (ref 135–145)
Total Bilirubin: 0.9 mg/dL (ref 0.3–1.2)
Total Protein: 5.3 g/dL — ABNORMAL LOW (ref 6.5–8.1)

## 2016-07-22 LAB — CBC
HEMATOCRIT: 33 % — AB (ref 39.0–52.0)
HEMOGLOBIN: 9.9 g/dL — AB (ref 13.0–17.0)
MCH: 27 pg (ref 26.0–34.0)
MCHC: 30 g/dL (ref 30.0–36.0)
MCV: 90.2 fL (ref 78.0–100.0)
Platelets: 281 10*3/uL (ref 150–400)
RBC: 3.66 MIL/uL — AB (ref 4.22–5.81)
RDW: 16.6 % — ABNORMAL HIGH (ref 11.5–15.5)
WBC: 13.8 10*3/uL — ABNORMAL HIGH (ref 4.0–10.5)

## 2016-07-22 LAB — GLUCOSE, CAPILLARY
GLUCOSE-CAPILLARY: 211 mg/dL — AB (ref 65–99)
GLUCOSE-CAPILLARY: 138 mg/dL — AB (ref 65–99)
Glucose-Capillary: 235 mg/dL — ABNORMAL HIGH (ref 65–99)
Glucose-Capillary: 342 mg/dL — ABNORMAL HIGH (ref 65–99)

## 2016-07-22 LAB — PROCALCITONIN: PROCALCITONIN: 0.28 ng/mL

## 2016-07-22 MED ORDER — INSULIN GLARGINE 100 UNIT/ML ~~LOC~~ SOLN
8.0000 [IU] | Freq: Every day | SUBCUTANEOUS | Status: DC
  Administered 2016-07-22 – 2016-07-31 (×10): 8 [IU] via SUBCUTANEOUS
  Administered 2016-08-01: 4 [IU] via SUBCUTANEOUS
  Administered 2016-08-02: 8 [IU] via SUBCUTANEOUS
  Filled 2016-07-22 (×12): qty 0.08

## 2016-07-22 MED ORDER — INSULIN ASPART 100 UNIT/ML ~~LOC~~ SOLN
0.0000 [IU] | Freq: Three times a day (TID) | SUBCUTANEOUS | Status: DC
Start: 2016-07-22 — End: 2016-08-02
  Administered 2016-07-22: 15 [IU] via SUBCUTANEOUS
  Administered 2016-07-22: 7 [IU] via SUBCUTANEOUS
  Administered 2016-07-23: 15 [IU] via SUBCUTANEOUS
  Administered 2016-07-23: 4 [IU] via SUBCUTANEOUS
  Administered 2016-07-23 – 2016-07-24 (×2): 7 [IU] via SUBCUTANEOUS
  Administered 2016-07-24: 11 [IU] via SUBCUTANEOUS
  Administered 2016-07-24: 7 [IU] via SUBCUTANEOUS
  Administered 2016-07-25: 4 [IU] via SUBCUTANEOUS
  Administered 2016-07-25: 20 [IU] via SUBCUTANEOUS
  Administered 2016-07-25: 2 [IU] via SUBCUTANEOUS
  Administered 2016-07-26: 4 [IU] via SUBCUTANEOUS
  Administered 2016-07-26: 7 [IU] via SUBCUTANEOUS
  Administered 2016-07-26: 11 [IU] via SUBCUTANEOUS
  Administered 2016-07-27: 15 [IU] via SUBCUTANEOUS
  Administered 2016-07-27: 4 [IU] via SUBCUTANEOUS
  Administered 2016-07-27: 2 [IU] via SUBCUTANEOUS
  Administered 2016-07-28 (×2): 4 [IU] via SUBCUTANEOUS
  Administered 2016-07-28: 15 [IU] via SUBCUTANEOUS
  Administered 2016-07-29: 4 [IU] via SUBCUTANEOUS
  Administered 2016-07-29: 3 [IU] via SUBCUTANEOUS
  Administered 2016-07-29: 7 [IU] via SUBCUTANEOUS
  Administered 2016-07-30: 4 [IU] via SUBCUTANEOUS
  Administered 2016-07-30: 15 [IU] via SUBCUTANEOUS
  Administered 2016-07-30: 7 [IU] via SUBCUTANEOUS
  Administered 2016-07-31: 4 [IU] via SUBCUTANEOUS
  Administered 2016-07-31: 3 [IU] via SUBCUTANEOUS
  Administered 2016-07-31 – 2016-08-01 (×2): 11 [IU] via SUBCUTANEOUS
  Administered 2016-08-01 (×2): 4 [IU] via SUBCUTANEOUS
  Administered 2016-08-02 (×2): 7 [IU] via SUBCUTANEOUS
  Administered 2016-08-02: 3 [IU] via SUBCUTANEOUS

## 2016-07-22 MED ORDER — CIPROFLOXACIN HCL 500 MG PO TABS
500.0000 mg | ORAL_TABLET | Freq: Two times a day (BID) | ORAL | Status: AC
  Administered 2016-07-22 – 2016-07-28 (×13): 500 mg via ORAL
  Filled 2016-07-22 (×13): qty 1

## 2016-07-22 NOTE — Progress Notes (Signed)
RT instructed patient on how to use the flutter device. Patient used the device properly and understands the concept.

## 2016-07-22 NOTE — Progress Notes (Signed)
Pharmacy Antibiotic Note  [name] is a 76 y.o. male admitted on 07/08/2016 for ITP and respiratory failure.  Pharmacy has been consulted for Cipro dosing.  Currently on ceftazidime for treatment of PsA PNA/empyema that was pan-sensitive on culture.  Switching to PO cipro to complete planned 14 day course.  Plan: Cipro 500 mg PO BID. Will place stop time of 3/30 for last day of 14 day course.  SCr WNL and stable so do not anticipate further dose adjustments. Pharmacy will sign off at this time from Cipro dosing.  Will continue to follow patient peripherally while under care of critical care.  Height: 5\' 11"  (180.3 cm) Weight: 175 lb 4.3 oz (79.5 kg) IBW/kg (Calculated) : 75.3  Temp (24hrs), Avg:97.8 F (36.6 C), Min:97.4 F (36.3 C), Max:98.3 F (36.8 C)   Recent Labs Lab 07/17/16 0329  07/18/16 0344  07/19/16 0522 07/20/16 0437 07/20/16 1001 07/21/16 0511 07/22/16 0442  WBC 14.5*  --  13.2*  --  11.8* 13.4*  --   --  13.8*  CREATININE 0.80  < > 0.87  < > 1.01 1.02 0.90 0.90 0.85  < > = values in this interval not displayed.  Estimated Creatinine Clearance: 81.2 mL/min (by C-G formula based on SCr of 0.85 mg/dL).    No Known Allergies  Antimicrobials this admission: 3/16 vancomycin >> 3/18 3/16 Zosyn>> 3/18 3/18 ceftazidime >> 3/24 3/24 cipro >>   Microbiology results: 3/17 Ucx: neg 3/16 BCx x2: ngtd 3/16 UCx: NGF 316 Pleural fluid: abundant PsA  (pans sens) 3/11 MRSA PCR: neg 3/17 UA: neg  Thank you for allowing pharmacy to be a part of this patient's care.  Hershal Coria 07/22/2016 12:26 PM

## 2016-07-22 NOTE — Progress Notes (Signed)
PULMONARY / CRITICAL CARE MEDICINE   Name: [name] MRN: [id number] DOB: Jun 16, 1940    ADMISSION DATE:  07/08/2016 CONSULTATION DATE: 3/15  REFERRING MD:  Barry Dienes   CHIEF COMPLAINT:  Vent management    SUBJECTIVE:  No issues, Got OOB CT output is diminishing  VITAL SIGNS: BP 123/61   Pulse 97   Temp 98 F (36.7 C) (Oral)   Resp (!) 24   Ht 5' 11" (1.803 m)   Wt 175 lb 4.3 oz (79.5 kg)   SpO2 95%   BMI 24.44 kg/m  [place] 2 liters HEMODYNAMICS:    VENTILATOR SETTINGS:    INTAKE / OUTPUT:  Intake/Output Summary (Last 24 hours) at 07/22/16 1154 Last data filed at 07/22/16 1100  Gross per 24 hour  Intake             1190 ml  Output             1115 ml  Net               75 ml   Gen:      No acute distress  HEENT:  EOMI, sclera anicteric Neck:     No masses; no thyromegaly Lungs:    Clear to auscultation bilaterally; normal respiratory effort, B/L Chest tubes in place CV:         Regular rate and rhythm; no murmurs Abd:      + bowel sounds; soft, non-tender; no palpable masses, no distension Ext:    No edema; adequate peripheral perfusion Skin:      Warm and dry; no rash Neuro: alert and oriented x 3 Psych: normal mood and affect  LABS:  BMET  Recent Labs Lab 07/20/16 1001 07/21/16 0511 07/22/16 0442  NA 149* 147* 144  K 3.1* 3.9 3.8  CL 108 110 105  CO2 34* 32 32  BUN 41* 40* 39*  CREATININE 0.90 0.90 0.85  GLUCOSE 237* 272* 227*    Electrolytes  Recent Labs Lab 07/17/16 1054  07/18/16 0344 07/18/16 0921 07/18/16 1700  07/20/16 1001 07/21/16 0511 07/22/16 0442  CALCIUM 8.1*  < >  --  8.6*  --   < > 9.0 8.8* 8.7*  MG 1.8  < > 2.0 1.9 2.1  --   --   --   --   PHOS 2.9  --  1.7*  --  2.9  --   --   --   --   < > = values in this interval not displayed. CBC  Recent Labs Lab 07/19/16 0522 07/20/16 0437 07/22/16 0442  WBC 11.8* 13.4* 13.8*  HGB 9.3* 9.1* 9.9*  HCT 29.6* 29.2* 33.0*  PLT 143* 187 281   Coag's No results for  input(s): APTT, INR in the last 168 hours. Sepsis Markers  Recent Labs Lab 07/21/16 1805 07/22/16 0442  PROCALCITON 0.21 0.28    ABG  Recent Labs Lab 07/16/16 0500 07/17/16 0500 07/20/16 0350  PHART 7.574* 7.576* 7.460*  PCO2ART 40.5 40.3 49.6*  PO2ART 72.8* 96.7 71.8*   Liver Enzymes  Recent Labs Lab 07/16/16 0310 07/17/16 0329 07/22/16 0442  AST 16 18 13*  ALT 9* 12* 11*  ALKPHOS 66 59 55  BILITOT 0.8 0.9 0.9  ALBUMIN 1.7* 2.4* 2.6*   Cardiac Enzymes No results for input(s): TROPONINI, PROBNP in the last 168 hours. Glucose  Recent Labs Lab 07/20/16 2122 07/21/16 0808 07/21/16 1156 07/21/16 1556 07/21/16 2155 07/22/16 0809  GLUCAP 307* 274* 239* 237* 195* 235*  Imaging CXR 3/22 >B/L chest tubes in place, Minimal effusion, atelectasis CXR 3/23 > B/L chest tubes, new rt mid lung opacities CXR 3/24 > B/L chest tubes, stable lung opacities I have reviewed the images personally.  STUDIES/events Splenectomy 3/15.  3/15 self extubaetd Right thora 3/16.Marland Kitchen 800 clearly exudate. Appears like old blood Pleural cytology 3/16>>> 3/16 bilateral chest tubes placed d/t old bloody appearing effusions w/ significant fibrinous debris noted on [place].  3/19:Pleural fluid grew Pseudomonas therefore this was EMPYEMA. CXR showing fluid has now filled loculated spaces where had loculated PTX after tube placement. Massive Anasarca. Now weaning some. Starting lasix gtt. CT chest.  CT chest 3/19: Partially loculated moderate bilateral hydropneumothoraces with bilateral chest tubes in place. Bilateral lower lobe airspace opacities. Ground-glass nodular opacities in the right upper lobe and lingula. Findings concerning for pneumonia. (personally reviewed by PB 3/20) 3/20: started pleural TPA/DNase. Developed delirium and to some degree paranoia. Placed on Seroquel and precedex.  3/21: 3.6 liters down since starting diuresis. Chest tubes put out a liter over 24hrs not counting loss  around insertion site. Day #2/3 TPA/DNase. Amio stopped, lasix stopped d/t met alk.  3/22 SBT started. Day #3/3 TPA/DNase. Looks great! Extubated 3/23 awake, Eating. Getting PT consult. Aline removed.   CULTURES: Pleural fluid 3/16>>>Pseudomonas (pan sens) Blood cultures 3/16>>>Negative   ANTIBIOTICS: vanc 3/16>>>off  Zosyn 3/16>>>3/18 Ceftaz 3/18>> 3/24 Cipro 3/24>>  LINES/TUBES: OETT 3/15>>>3/16 (self extubated) ETT 3/16>> 3/22 Bilateral Chest tubes 3/16 R PICC 3/17>>>  ASSESSMENT / PLAN: Assessment/Plan: 76 year old with complicated past medical history admitted for splenectomy for ITP management.  #1 ITP, thrombocytopenia s/p splenectomy Follow CBC  #2 Resp failure B/L pneumonia with pseudomonas empyema Septic shock H/O bronchiectasis s/p DNAse, TPA via chest tubes.  Place CTs to water seal. Repeat cxr Can change ceftax to cipro. Day 8/14 of antibiotics Follow pct Incentive spirometer, flutter valve  #3 A fib Massive anasarca, volume overload Off amio Restarted Coreg Optimize nutrition, PO Intake Nutrition consult Off lasix  #4 DM SSI coverage Start lantus 8 units daily  #5 Delirium Continue seroquel  Stable for transfer to SDU. Marshell Garfinkel MD [facility] Pager [phone] If no answer or after 3pm call: [phone] 07/22/2016, 12:04 PM

## 2016-07-22 NOTE — Progress Notes (Signed)
General Surgery [facility]  Assessment & Plan:  1.  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  Question right middle lobe pneumonia on CXR             Extubated 07/20/2016             Ceftaxidime  Per CCM 3.  Severe COPD 4.  Bilateral chest tubes        Earnstine Regal, MD, [facility]       Office: [phone]    Subjective: Patient up in chair eating breakfast.  No complaints.  Objective: Vital signs in last 24 hours: Temp:  [97.4 F (36.3 C)-98.3 F (36.8 C)] 97.4 F (36.3 C) (03/24 0320) Pulse Rate:  [88-119] 119 (03/24 0800) Resp:  [19-30] 24 (03/24 0800) BP: (96-132)/(44-100) 132/61 (03/24 0800) SpO2:  [88 %-96 %] 96 % (03/24 0309) Weight:  [79.5 kg (175 lb 4.3 oz)] 79.5 kg (175 lb 4.3 oz) (03/24 0400) Last BM Date: 07/21/16  Intake/Output from previous day: 03/23 0701 - 03/24 0700 In: 1470 [P.O.:1320; IV Piggyback:150] Out: 1330 [Urine:980; Chest Tube:350] Intake/Output this shift: Total I/O In: 20 [I.V.:20] Out: 60 [Urine:60]  Physical Exam: HEENT - sclerae clear, mucous membranes moist Chest - clear bilaterally; chest tubes with thin serous output Cor - RRR Abdomen - soft without distension; incisions dry and intact Neuro - alert & oriented, no focal deficits   Lab Results:   Recent Labs  07/20/16 0437 07/22/16 0442  WBC 13.4* 13.8*  HGB 9.1* 9.9*  HCT 29.2* 33.0*  PLT 187 281   BMET  Recent Labs  07/21/16 0511 07/22/16 0442  NA 147* 144  K 3.9 3.8  CL 110 105  CO2 32 32  GLUCOSE 272* 227*  BUN 40* 39*  CREATININE 0.90 0.85  CALCIUM 8.8* 8.7*   PT/INR No results for input(s): LABPROT, INR in the last 72 hours. Comprehensive Metabolic Panel:    Component Value Date/Time   NA 144 07/22/2016 0442   NA 147 (H) 07/21/2016 0511   NA 136 06/06/2016 1101   NA 136  05/15/2016 0938   K 3.8 07/22/2016 0442   K 3.9 07/21/2016 0511   K 3.7 06/06/2016 1101   K 4.5 05/15/2016 0938   CL 105 07/22/2016 0442   CL 110 07/21/2016 0511   CO2 32 07/22/2016 0442   CO2 32 07/21/2016 0511   CO2 32 (H) 06/06/2016 1101   CO2 31 (H) 05/15/2016 0938   BUN 39 (H) 07/22/2016 0442   BUN 40 (H) 07/21/2016 0511   BUN 24.5 06/06/2016 1101   BUN 20.6 05/15/2016 0938   CREATININE 0.85 07/22/2016 0442   CREATININE 0.90 07/21/2016 0511   CREATININE 0.8 06/06/2016 1101   CREATININE 0.8 05/15/2016 0938   GLUCOSE 227 (H) 07/22/2016 0442   GLUCOSE 272 (H) 07/21/2016 0511   GLUCOSE 225 (H) 06/06/2016 1101   GLUCOSE 258 (H) 05/15/2016 0938   CALCIUM 8.7 (L) 07/22/2016 0442   CALCIUM 8.8 (L) 07/21/2016 0511   CALCIUM 8.9 06/06/2016 1101   CALCIUM 8.7 05/15/2016 0938   AST 13 (L) 07/22/2016 0442   AST 18 07/17/2016  0329   AST 14 06/06/2016 1101   AST 8 05/15/2016 0938   ALT 11 (L) 07/22/2016 0442   ALT 12 (L) 07/17/2016 0329   ALT 18 06/06/2016 1101   ALT 10 05/15/2016 0938   ALKPHOS 55 07/22/2016 0442   ALKPHOS 59 07/17/2016 0329   ALKPHOS 71 06/06/2016 1101   ALKPHOS 64 05/15/2016 0938   BILITOT 0.9 07/22/2016 0442   BILITOT 0.9 07/17/2016 0329   BILITOT 0.56 06/06/2016 1101   BILITOT 0.39 05/15/2016 0938   PROT 5.3 (L) 07/22/2016 0442   PROT 5.3 (L) 07/17/2016 0329   PROT 6.7 06/06/2016 1101   PROT 6.2 (L) 05/15/2016 0938   ALBUMIN 2.6 (L) 07/22/2016 0442   ALBUMIN 2.4 (L) 07/17/2016 0329   ALBUMIN 2.4 (L) 06/06/2016 1101   ALBUMIN 2.8 (L) 05/15/2016 0938    Studies/Results: Dg Chest Port 1 View  Result Date: 07/22/2016 CLINICAL DATA:  Pleural effusion EXAM: PORTABLE CHEST 1 VIEW COMPARISON:  July 21, 2016 FINDINGS: Central catheter tip is in the superior vena cava. There are chest tubes bilaterally. No pneumothorax. There are bilateral pleural effusions, stable. There is bibasilar atelectasis. No consolidation evident. Heart is upper normal in size with  pulmonary vascularity within normal limits. No bone lesions. IMPRESSION: Tube and catheter positions as described without evident pneumothorax. Small pleural effusions and bibasilar atelectasis persist. No new opacity. Stable cardiac silhouette. Electronically Signed   By: Lowella Grip III M.D.   On: 07/22/2016 07:51   Dg Chest Port 1 View  Result Date: 07/21/2016 CLINICAL DATA:  Recent pneumonia EXAM: PORTABLE CHEST 1 VIEW COMPARISON:  July 20, 2016 FINDINGS: Endotracheal tube and nasogastric tube have been removed. Central catheter tip is in the superior vena cava near the cavoatrial junction. Chest tubes are noted bilaterally. There is no appreciable pneumothorax. There are small pleural effusions bilaterally, slightly larger on the left than on the right. There is mild bibasilar atelectasis. There is new patchy infiltrate in the right mid lung, concerning for early pneumonia in this area. Heart is mildly enlarged with pulmonary vascularity within normal limits. There is atherosclerotic calcification in the aorta. No adenopathy. No bone lesions. IMPRESSION: Patchy infiltrate right mid lung. Suspect early pneumonia. Small pleural effusions with bibasilar atelectasis present with chest tubes present bilaterally. No pneumothorax. There is aortic atherosclerosis. No change in central catheter position. Electronically Signed   By: Lowella Grip III M.D.   On: 07/21/2016 07:16      Abbye Lao M 07/22/2016  Patient ID: [name], male   DOB: 1940-08-23, 76 y.o.   MRN: [id number]

## 2016-07-22 NOTE — Progress Notes (Signed)
   Dr Emily Filbert rounding note reviewed from yesterday. He is now off diuretics. Telemetry shows sinus tach, no significant arrhythmias. We will continue coreg at this time, expect degree of sinus tach given his ongoing systemic illness. We will monitor telemetry over the weekend, call with questions.         Carlyle Dolly, M.D.

## 2016-07-23 ENCOUNTER — Inpatient Hospital Stay (HOSPITAL_COMMUNITY): Payer: Medicare HMO

## 2016-07-23 LAB — BASIC METABOLIC PANEL
ANION GAP: 5 (ref 5–15)
BUN: 36 mg/dL — ABNORMAL HIGH (ref 6–20)
CALCIUM: 8.7 mg/dL — AB (ref 8.9–10.3)
CHLORIDE: 104 mmol/L (ref 101–111)
CO2: 33 mmol/L — AB (ref 22–32)
Creatinine, Ser: 0.8 mg/dL (ref 0.61–1.24)
GFR calc Af Amer: >60 mL/min (ref >60)
GFR calc non Af Amer: >60 mL/min (ref >60)
GLUCOSE: 193 mg/dL — AB (ref 65–99)
Potassium: 3.9 mmol/L (ref 3.5–5.1)
Sodium: 142 mmol/L (ref 135–145)

## 2016-07-23 LAB — GLUCOSE, CAPILLARY
GLUCOSE-CAPILLARY: 371 mg/dL — AB (ref 65–99)
GLUCOSE-CAPILLARY: 122 mg/dL — AB (ref 65–99)
Glucose-Capillary: 206 mg/dL — ABNORMAL HIGH (ref 65–99)
Glucose-Capillary: 245 mg/dL — ABNORMAL HIGH (ref 65–99)

## 2016-07-23 LAB — CBC
HCT: 31.3 % — ABNORMAL LOW (ref 39.0–52.0)
Hemoglobin: 9.5 g/dL — ABNORMAL LOW (ref 13.0–17.0)
MCH: 27.1 pg (ref 26.0–34.0)
MCHC: 30.4 g/dL (ref 30.0–36.0)
MCV: 89.4 fL (ref 78.0–100.0)
PLATELETS: 266 10*3/uL (ref 150–400)
RBC: 3.5 MIL/uL — AB (ref 4.22–5.81)
RDW: 16.4 % — ABNORMAL HIGH (ref 11.5–15.5)
WBC: 12.7 10*3/uL — ABNORMAL HIGH (ref 4.0–10.5)

## 2016-07-23 LAB — MAGNESIUM: Magnesium: 1.7 mg/dL (ref 1.7–2.4)

## 2016-07-23 LAB — PROCALCITONIN: Procalcitonin: 0.18 ng/mL

## 2016-07-23 LAB — PHOSPHORUS: Phosphorus: 2.5 mg/dL (ref 2.5–4.6)

## 2016-07-23 MED ORDER — IPRATROPIUM BROMIDE 0.02 % IN SOLN
0.5000 mg | Freq: Three times a day (TID) | RESPIRATORY_TRACT | Status: DC
  Administered 2016-07-24 – 2016-08-02 (×25): 0.5 mg via RESPIRATORY_TRACT
  Filled 2016-07-23 (×29): qty 2.5

## 2016-07-23 MED ORDER — LEVALBUTEROL HCL 1.25 MG/0.5ML IN NEBU
1.2500 mg | INHALATION_SOLUTION | Freq: Three times a day (TID) | RESPIRATORY_TRACT | Status: DC
  Administered 2016-07-24 – 2016-08-02 (×25): 1.25 mg via RESPIRATORY_TRACT
  Filled 2016-07-23 (×32): qty 0.5

## 2016-07-23 NOTE — Progress Notes (Signed)
Telemetry reviewed, isolated episode of SVT yesterday. Overall SR normal rates. Contniue current meds, no further recs today. Please call with questions.   Zandra Abts MD

## 2016-07-23 NOTE — Progress Notes (Addendum)
PULMONARY / CRITICAL CARE MEDICINE   Name: [name] MRN: [id number] DOB: Jan 06, 1941    ADMISSION DATE:  07/08/2016 CONSULTATION DATE: 3/15  REFERRING MD:  Barry Dienes   CHIEF COMPLAINT:  Vent management    SUBJECTIVE:  No issues. Got OOB CT output is minimal  VITAL SIGNS: BP (!) 101/44   Pulse (!) 101   Temp 97.6 F (36.4 C) (Oral)   Resp (!) 26   Ht _0  (1.803 m)   Wt 176 lb 2.4 oz (79.9 kg)   SpO2 100%   BMI 24.57 kg/m  [place] 2 liters HEMODYNAMICS:    VENTILATOR SETTINGS:    INTAKE / OUTPUT:  Intake/Output Summary (Last 24 hours) at 07/23/16 1128 Last data filed at 07/23/16 0915  Gross per 24 hour  Intake              790 ml  Output              700 ml  Net               90 ml   Blood pressure (!) 101/44, pulse (!) 101, temperature 97.6 F (36.4 C), temperature source Oral, resp. rate (!) 26, height _1  (1.803 m), weight 176 lb 2.4 oz (79.9 kg), SpO2 100 %. Gen:      No acute distress HEENT:  EOMI, sclera anicteric Neck:     No masses; no thyromegaly Lungs:    Clear to auscultation bilaterally; normal respiratory effort, B/L chest tubes in place CV:         Regular rate and rhythm; no murmurs Abd:      + bowel sounds; soft, non-tender; no palpable masses, no distension Ext:    No edema; adequate peripheral perfusion Skin:      Warm and dry; no rash Neuro: alert and oriented x 3 Psych: normal mood and affect   LABS:  BMET  Recent Labs Lab 07/21/16 0511 07/22/16 0442 07/23/16 0500  NA 147* 144 142  K 3.9 3.8 3.9  CL 110 105 104  CO2 32 32 33*  BUN 40* 39* 36*  CREATININE 0.90 0.85 0.80  GLUCOSE 272* 227* 193*    Electrolytes  Recent Labs Lab 07/18/16 0344 07/18/16 0921 07/18/16 1700  07/21/16 0511 07/22/16 0442 07/23/16 0500  CALCIUM  --  8.6*  --   < > 8.8* 8.7* 8.7*  MG 2.0 1.9 2.1  --   --   --  1.7  PHOS 1.7*  --  2.9  --   --   --  2.5  < > = values in this interval not displayed. CBC  Recent Labs Lab 07/20/16 0437  07/22/16 0442 07/23/16 0500  WBC 13.4* 13.8* 12.7*  HGB 9.1* 9.9* 9.5*  HCT 29.2* 33.0* 31.3*  PLT 187 281 266   Coag's No results for input(s): APTT, INR in the last 168 hours. Sepsis Markers  Recent Labs Lab 07/21/16 1805 07/22/16 0442 07/23/16 0500  PROCALCITON 0.21 0.28 0.18    ABG  Recent Labs Lab 07/17/16 0500 07/20/16 0350  PHART 7.576* 7.460*  PCO2ART 40.3 49.6*  PO2ART 96.7 71.8*   Liver Enzymes  Recent Labs Lab 07/17/16 0329 07/22/16 0442  AST 18 13*  ALT 12* 11*  ALKPHOS 59 55  BILITOT 0.9 0.9  ALBUMIN 2.4* 2.6*   Cardiac Enzymes No results for input(s): TROPONINI, PROBNP in the last 168 hours. Glucose  Recent Labs Lab 07/21/16 2155 07/22/16 0809 07/22/16 1158 07/22/16 1652 07/22/16 2235  07/23/16 0800  GLUCAP 195* 235* 342* 211* 138* 206*   Imaging CXR 3/22 >B/L chest tubes in place, Minimal effusion, atelectasis CXR 3/23 > B/L chest tubes, new rt mid lung opacities CXR 3/24 > B/L chest tubes, stable lung opacities CXR 3/25 > B/L chest tubes. Improving lung opacities, effusions I have reviewed the images personally.  STUDIES/events Splenectomy 3/15.  3/15 self extubated Right thora 3/16.Marland Kitchen 800 clearly exudate. Appears like old blood Pleural cytology 3/16>>> 3/16 bilateral chest tubes placed d/t old bloody appearing effusions w/ significant fibrinous debris noted on [place].  3/19:Pleural fluid grew Pseudomonas therefore this was EMPYEMA. CXR showing fluid has now filled loculated spaces where had loculated PTX after tube placement. Massive Anasarca. Now weaning some. Starting lasix gtt. CT chest.  CT chest 3/19: Partially loculated moderate bilateral hydropneumothoraces with bilateral chest tubes in place. Bilateral lower lobe airspace opacities. Ground-glass nodular opacities in the right upper lobe and lingula. Findings concerning for pneumonia. (personally reviewed by PB 3/20) 3/20: started pleural TPA/DNase. Developed delirium and  to some degree paranoia. Placed on Seroquel and precedex.  3/21: 3.6 liters down since starting diuresis. Chest tubes put out a liter over 24hrs not counting loss around insertion site. Day #2/3 TPA/DNase. Amio stopped, lasix stopped d/t met alk.  3/22 SBT started. Day #3/3 TPA/DNase. Looks great! Extubated 3/23 awake, Eating. Getting PT consult. Aline removed.   CULTURES: Pleural fluid 3/16>>>Pseudomonas (pan sens) Blood cultures 3/16>>>Negative   ANTIBIOTICS: vanc 3/16>>>off  Zosyn 3/16>>>3/18 Ceftaz 3/18>> 3/24 Cipro 3/24>>  LINES/TUBES: OETT 3/15>>>3/16 (self extubated) ETT 3/16>> 3/22 Bilateral Chest tubes 3/16 R PICC 3/17>>>  ASSESSMENT / PLAN: Assessment/Plan: 76 year old with complicated past medical history admitted for splenectomy for ITP management.  #1 ITP, thrombocytopenia s/p splenectomy Follow CBC  #2 Resp failure B/L pneumonia with pseudomonas empyema Septic shock H/O bronchiectasis s/p TPA, DNAse for loculated effusions  Remove chest tubes today as output is 10cc/24 hrs Changed ceftaz to cipro. Day 9/14 of antibiotics Incentive spirometer, flutter valve  #3 A fib Massive anasarca, volume overload Off amio, continue coreg PO intake Nutrition consult to optimize feeds Off asix  #4 DM SSI coverage lantus  #5 Delirium Continue seroquel  Marshell Garfinkel MD [facility] Pager [phone] If no answer or after 3pm call: [phone] 07/23/2016, 11:28 AM

## 2016-07-23 NOTE — Progress Notes (Addendum)
PROGRESS NOTE    [name]  ERD:[id number] DOB: [birth year] DOA: 07/08/2016 PCP: [name], MD  Brief Narrative:  [name] is a 76 y.o. male with medical history significant of hypertension, hyperlipidemia, diabetes mellitus (possible steroid induced), COPD  (home o2 3liters started in 06/2016), bronchiectasis, GERD, idiopathic thrombocytopenia, dCHF, bilateral pleural effusion,PSVT who presents with shortness of breath and bilateral leg edema.  He was recently hospitalized from 2/10 to 2/20 for pleural effusion( had thoracentesis and diuresed), SVT, ITP, he Returns to the ED on 3/11 for HR >150 noted while he was checking Vitals at home. ER performed electrical cardioverstion due to concomitant complaint of dyspnea and hypotension, he was started on Cardizem and amiodarone infusion and admitted to SDU under hospitalist service.   Has severe refractory ITP so Oncology consulted and recommended Splenectomy so General Surgery consulted patient to undergo splenectomy on 3/15. Patient is s/p Left Thoracentesis 3/14, culture+ pseudomonas. He is transferred to ICU intubated post op. He self extubated on 3/15, had to be reintubated due to respiratory failure. He has bilatearal chest tube placed on 3/16, he received TPA/DNAse due to loculated pleural effusion, ( he is not a candidate for VATs per pulmonology) He has improved, extubated on 3/22 and transferred to hospitalist service on 3/25  Pulmonology continue to follow for bilateral chest tube management Cardiology continue to follow for afib General surgery continue to follow , s/p LAPAROSCOPIC SPLENECTOMY for ITP on 07/13/2016 [facility] Oncology Dr Gudena/Dr Magrinat/Lindsey Holley Dexter,  NP follow peripherally now (if any hematology issues arise [phone].)   Assessment & Plan:   Principal Problem:   Chronic ITP (idiopathic thrombocytopenia) ([place]), s/p splenectomy on 3/15 Active Problems:   Paroxysmal SVT  (supraventricular tachycardia) (HCC)   Chronic diastolic CHF (congestive heart failure) ([place])   Diabetes mellitus without complication (HCC)   GERD (gastroesophageal reflux disease)   Empyema ([place])   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 Complicated further by baseline COPD and Bronchiectasis    Disorders of fluid, electrolyte, and acid-base balance   Anemia in acute on chronic illness   Severe protein-calorie malnutrition (HCC)   Anasarca   Severe muscle deconditioning   Septic shock (HCC)   Acute encephalopathy   Arterial hypotension   Pneumonia  SVT  At home and in ER (presenting symptom) Paroxysmal A-fib with RVR subsequently -  s/p electrical cardioversion in ER following by Cardizem (stopped on 3/17)/amiodarone infusion (stopped on 3/21) - now on coreg, CHADSVASC score 4( age, htn,dm2, diastolic chf), will discuss with cardiology about anticoagulation, now that his thrombocytopenia has resolved. -echo with lvef 60-65%, grade II diastolic dysfunction - cardiology continue following daily, input appreciated   ITP  - failed multiple line of medical therapy,  plt count 5 on admission, nose bleed (was on around clock amicar), blood loss anemia - s/p Transfusions of 4 pools of Platelets s/p 4prbc transfusion, s/p Splenectomy on 3/15 -plt has been normalized sicne 3/22 -surgery continue to follow daily, oncology available as needed, will discuss with general surgery regarding post splenectomy vaccinations  Hypoxic respiratory failure/septic shock Self extubated post op on 3/15, but reintubated due to hypoxic respiratory failure and septic shock Improving, extubated on 3/22, on abx for pna/empyema, blood culture no growth, procaltitonin trending down. 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  - stable on atrovent/xopenex -pulmonology following daily   ICU delirium:  required precedex and seroquel Improving, on seroquel 50mg  qhs now    insulin dependent Diabetes mellitus( prednisone induced?) - A1c 6.6 - off steroid,  -continue adjust lantus/ssi  HLD continue statin  Abnormal thyroid functions tsh 5, free t4 1.39 - recommend these be rechecked in 3-4 wks  DVT prophylaxis: SCDs then heparin Code Status: FULL CODE Family Communication: No Family present at bedside Disposition Plan:  SNF/LTAC for bilateral chest tube?; will need general surgery/pccm/cardiology/oncology clearance for discharge  Consultants:   Cardiology  Oncology  General Surgery  Interventional Radiology  Pulmonology /critical care   Procedures:   Left Thoracentesis by IR on 3/14  s/p Transfusions of 4 pools of Platelets   s/p 4prbc transfusion  s/p Splenectomy on 3/15  intubation on 3/15, self extubation on 3/15, reintubation on 3/15, extubation on 3/22  Bilateral chest tube placement on 3/16  R picc line placement on 3/17  Aline discontinued on 3/23   Antimicrobials:  Anti-infectives    Start     Dose/Rate Route Frequency Ordered Stop   07/22/16 1400  ciprofloxacin (CIPRO) tablet 500 mg     500 mg Oral 2 times daily 07/22/16 1223 07/28/16 2359   07/16/16 1200  cefTAZidime (FORTAZ) 2 g in dextrose 5 % 50 mL IVPB  Status:  Discontinued     2 g 100 mL/hr over 30 Minutes Intravenous Every 8 hours 07/16/16 0942 07/22/16 1207   07/15/16 0600  vancomycin (VANCOCIN) 1,250 mg in sodium chloride 0.9 % 250 mL IVPB  Status:  Discontinued      1,250 mg 166.7 mL/hr over 90 Minutes Intravenous Every 12 hours 07/14/16 1901 07/16/16 0942   07/15/16 0200  piperacillin-tazobactam (ZOSYN) IVPB 3.375 g  Status:  Discontinued     3.375 g 12.5 mL/hr over 240 Minutes Intravenous Every 8 hours 07/14/16 1901 07/16/16 0942   07/14/16 1930  vancomycin (VANCOCIN) 1,500 mg in sodium chloride 0.9 % 500 mL IVPB     1,500 mg 250 mL/hr over 120 Minutes Intravenous  Once 07/14/16 1901 07/14/16 2251   07/14/16 1930  piperacillin-tazobactam (ZOSYN) IVPB 3.375 g     3.375 g 100 mL/hr over 30 Minutes Intravenous  Once 07/14/16 1901 07/14/16 2058     Subjective:   Objective: Vitals:   07/23/16 0700 07/23/16 0800 07/23/16 0900 07/23/16 0927  BP: (!) 131/47  (!) 107/44 (!) 101/44  Pulse: 87  94 (!) 101  Resp: (!) 25  (!) 26   Temp:  97.6 F (36.4 C)    TempSrc:  Oral    SpO2:      Weight:      Height:        Intake/Output Summary (Last 24 hours) at 07/23/16 0930 Last data filed at 07/23/16 0915  Gross per 24 hour  Intake              810 ml  Output              700 ml  Net              110 ml   Filed Weights   07/21/16 0500 07/22/16 0400 07/23/16  0500  Weight: 82 kg (180 lb 12.4 oz) 79.5 kg (175 lb 4.3 oz) 79.9 kg (176 lb 2.4 oz)   Examination: Physical Exam:  Constitutional: Appears calm and in no acute distress Eyes: Lids and conjunctivae normal, sclerae anicteric  ENMT: External Ears, Nares appear to have dry blood.  Neck: Appears normal, supple, no cervical masses, normal ROM, no appreciable thyromegaly Respiratory: Diminished bilaterally, no wheezing, bilateral chest tube inplace.  Cardiovascular: RRR, .previously documented  2+ Lower Extremity Edema has resolved.  Abdomen: Soft, non-tender, non-distended. No masses palpated. No appreciable hepatosplenomegaly. Bowel sounds positive x4.  GU: Deferred. Musculoskeletal: No clubbing / cyanosis of digits/nails. No joint deformity upper and lower extremities. Skin: No rashes,  lesions, ulcers on limited skin evaluation. No induration; Warm and dry.  Neurologic: CN 2-12 grossly intact with no focal deficits. Romberg sign cerebellar reflexes not assessed.  Psychiatric: Normal judgment and insight. Alert and oriented x 3. Normal mood and appropriate affect.   Data Reviewed: I have personally reviewed following labs and imaging studies  CBC:  Recent Labs Lab 07/17/16 0329 07/18/16 0344 07/19/16 0522 07/20/16 0437 07/22/16 0442 07/23/16 0500  WBC 14.5* 13.2* 11.8* 13.4* 13.8* 12.7*  NEUTROABS 13.1* 12.4* 10.2* 11.7*  --   --   HGB 9.0* 8.9* 9.3* 9.1* 9.9* 9.5*  HCT 27.5* 28.3* 29.6* 29.2* 33.0* 31.3*  MCV 85.1 84.7 87.1 89.0 90.2 89.4  PLT 58* 105* 143* 187 281 607   Basic Metabolic Panel:  Recent Labs Lab 07/17/16 0329 07/17/16 1054  07/18/16 0025 07/18/16 0344 07/18/16 0921 07/18/16 1700 07/19/16 0522 07/20/16 0437 07/20/16 1001 07/21/16 0511 07/22/16 0442 07/23/16 0500  NA 137 136  < > 140  --  141  --  147*  --  149* 147* 144 142  K 2.9* 3.2*  < > 3.6  --  3.4*  --  3.1*  --  3.1* 3.9 3.8 3.9  CL 93* 93*  < > 97*  --  96*  --  98*  --  108 110 105 104  CO2 36* 35*  < > 37*  --  38*  --  39*  --  34* 32 32 33*  GLUCOSE 237* 210*  < > 349*  --  426*  --  320*  --  237* 272* 227* 193*  BUN 22* 24*  < > 29*  --  28*  --  33*  --  41* 40* 39* 36*  CREATININE 0.80 0.77  < > 0.86 0.87 0.91  --  1.01 1.02 0.90 0.90 0.85 0.80  CALCIUM 7.9* 8.1*  < > 8.4*  --  8.6*  --  8.7*  --  9.0 8.8* 8.7* 8.7*  MG 1.8 1.8  < > 2.2 2.0 1.9 2.1  --   --   --   --   --  1.7  PHOS 2.6 2.9  --   --  1.7*  --  2.9  --   --   --   --   --  2.5  < > = values in this interval not displayed. GFR: Estimated Creatinine Clearance: 86.3 mL/min (by C-G formula based on SCr of 0.8 mg/dL). Liver Function Tests:  Recent Labs Lab 07/17/16 0329 07/22/16 0442  AST 18 13*  ALT 12* 11*  ALKPHOS 59 55  BILITOT 0.9 0.9  PROT 5.3* 5.3*  ALBUMIN 2.4* 2.6*   No results for  input(s): LIPASE, AMYLASE in the last 168 hours. No results for input(s): AMMONIA in  the last 168 hours. Coagulation Profile: No results for input(s): INR, PROTIME in the last 168 hours. Cardiac Enzymes: No results for input(s): CKTOTAL, CKMB, CKMBINDEX, TROPONINI in the last 168 hours. BNP (last 3 results)  Recent Labs  02/17/16 0948 05/05/16 1450  PROBNP 40.0 17.0   HbA1C: No results for input(s): HGBA1C in the last 72 hours. CBG:  Recent Labs Lab 07/22/16 0809 07/22/16 1158 07/22/16 1652 07/22/16 2235 07/23/16 0800  GLUCAP 235* 342* 211* 138* 206*   Lipid Profile: No results for input(s): CHOL, HDL, LDLCALC, TRIG, CHOLHDL, LDLDIRECT in the last 72 hours. Thyroid Function Tests: No results for input(s): TSH, T4TOTAL, FREET4, T3FREE, THYROIDAB in the last 72 hours. Anemia Panel: No results for input(s): VITAMINB12, FOLATE, FERRITIN, TIBC, IRON, RETICCTPCT in the last 72 hours. Sepsis Labs:  Recent Labs Lab 07/21/16 1805 07/22/16 0442 07/23/16 0500  PROCALCITON 0.21 0.28 0.18    Recent Results (from the past 240 hour(s))  Body fluid culture     Status: None   Collection Time: 07/14/16  9:51 AM  Result Value Ref Range Status   Specimen Description PLEURAL  Final   Special Requests NONE  Final   Gram Stain   Final    NO WBC SEEN RARE GRAM NEGATIVE RODS Performed at [facility], [place] [address], [place], [place] [address]    Culture ABUNDANT PSEUDOMONAS AERUGINOSA  Final   Report Status 07/17/2016 FINAL  Final   Organism ID, Bacteria PSEUDOMONAS AERUGINOSA  Final      Susceptibility   Pseudomonas aeruginosa - MIC*    CEFTAZIDIME 4 SENSITIVE Sensitive     CIPROFLOXACIN <=0.25 SENSITIVE Sensitive     GENTAMICIN <=1 SENSITIVE Sensitive     IMIPENEM 0.5 SENSITIVE Sensitive     PIP/TAZO <=4 SENSITIVE Sensitive     CEFEPIME 2 SENSITIVE Sensitive     * ABUNDANT PSEUDOMONAS AERUGINOSA  Culture, blood (Routine X 2) w Reflex to ID Panel     Status: None    Collection Time: 07/14/16  6:40 PM  Result Value Ref Range Status   Specimen Description BLOOD LEFT ANTECUBITAL  Final   Special Requests IN PEDIATRIC BOTTLE 3CC  Final   Culture   Final    NO GROWTH 5 DAYS Performed at [facility], [address], [place], [place] [address]    Report Status 07/19/2016 FINAL  Final  Culture, blood (Routine X 2) w Reflex to ID Panel     Status: None   Collection Time: 07/14/16  7:45 PM  Result Value Ref Range Status   Specimen Description LEFT ANTECUBITAL  Final   Special Requests BOTTLES DRAWN AEROBIC AND ANAEROBIC 3CC  Final   Culture   Final    NO GROWTH 5 DAYS Performed at [facility], [place] [address] [place], [place] [address]    Report Status 07/19/2016 FINAL  Final  Culture, Urine     Status: None   Collection Time: 07/15/16  5:47 AM  Result Value Ref Range Status   Specimen Description URINE, RANDOM  Final   Special Requests NONE  Final   Culture   Final    NO GROWTH Performed at [facility], [place] [address] [place], [place] [address]    Report Status 07/16/2016 FINAL  Final    Radiology Studies: Dg Chest Port 1 View  Result Date: 07/23/2016 CLINICAL DATA:  Acute respiratory failure. COPD. Pleural effusions. EXAM: PORTABLE CHEST 1 VIEW COMPARISON:  07/22/2016. FINDINGS: No  change cardiomediastinal silhouette. Decreasing BILATERAL pleural effusions. Improved aeration of both bases. RIGHT remains worse than LEFT. BILATERAL chest tubes in good position. No pneumothorax. PICC line stable. Unchanged cardiomediastinal silhouette. IMPRESSION: Slight improvement aeration Electronically Signed   By: Staci Righter M.D.   On: 07/23/2016 07:15   Dg Chest Port 1 View  Result Date: 07/22/2016 CLINICAL DATA:  Pleural effusion EXAM: PORTABLE CHEST 1 VIEW COMPARISON:  July 21, 2016 FINDINGS: Central catheter tip is in the superior vena cava. There are chest tubes bilaterally. No pneumothorax. There are bilateral pleural effusions,  stable. There is bibasilar atelectasis. No consolidation evident. Heart is upper normal in size with pulmonary vascularity within normal limits. No bone lesions. IMPRESSION: Tube and catheter positions as described without evident pneumothorax. Small pleural effusions and bibasilar atelectasis persist. No new opacity. Stable cardiac silhouette. Electronically Signed   By: Lowella Grip III M.D.   On: 07/22/2016 07:51   Scheduled Meds: . atorvastatin  40 mg Per Tube q1800  . budesonide (PULMICORT) nebulizer solution  0.25 mg Nebulization BID  . carvedilol  3.125 mg Oral BID WC  . Chlorhexidine Gluconate Cloth  6 each Topical Daily  . ciprofloxacin  500 mg Oral BID  . heparin subcutaneous  5,000 Units Subcutaneous Q8H  . insulin aspart  0-20 Units Subcutaneous TID WC  . insulin glargine  8 Units Subcutaneous Daily  . ipratropium  0.5 mg Nebulization Q6H  . lactose free nutrition  237 mL Oral TID BM  . levalbuterol  1.25 mg Nebulization Q6H  . loratadine  10 mg Oral Daily  . mouth rinse  15 mL Mouth Rinse BID  . multivitamin with minerals  1 tablet Oral Daily  . QUEtiapine  50 mg Per Tube QHS  . sodium chloride flush  10-40 mL Intracatheter Q12H   Continuous Infusions:   LOS: 14 days    Time spent> 76mins Stephen Grassia, MD PhD [facility] Pager [phone]  If 7PM-7AM, please contact night-coverage [email or web address] Password [email or web address] 07/23/2016, 9:30 AM

## 2016-07-24 ENCOUNTER — Inpatient Hospital Stay (HOSPITAL_COMMUNITY): Payer: Medicare HMO

## 2016-07-24 DIAGNOSIS — J151 Pneumonia due to Pseudomonas: Secondary | ICD-10-CM

## 2016-07-24 DIAGNOSIS — J90 Pleural effusion, not elsewhere classified: Secondary | ICD-10-CM

## 2016-07-24 LAB — BASIC METABOLIC PANEL
ANION GAP: 5 (ref 5–15)
BUN: 33 mg/dL — AB (ref 6–20)
CO2: 32 mmol/L (ref 22–32)
Calcium: 8.5 mg/dL — ABNORMAL LOW (ref 8.9–10.3)
Chloride: 103 mmol/L (ref 101–111)
Creatinine, Ser: 0.81 mg/dL (ref 0.61–1.24)
GFR calc Af Amer: >60 mL/min (ref >60)
GFR calc non Af Amer: >60 mL/min (ref >60)
GLUCOSE: 165 mg/dL — AB (ref 65–99)
POTASSIUM: 4 mmol/L (ref 3.5–5.1)
Sodium: 140 mmol/L (ref 135–145)

## 2016-07-24 LAB — CBC WITH DIFFERENTIAL/PLATELET
BASOS ABS: 0 10*3/uL (ref 0.0–0.1)
Basophils Relative: 0 %
Eosinophils Absolute: 0.1 10*3/uL (ref 0.0–0.7)
Eosinophils Relative: 1 %
HEMATOCRIT: 29.8 % — AB (ref 39.0–52.0)
Hemoglobin: 9.1 g/dL — ABNORMAL LOW (ref 13.0–17.0)
LYMPHS ABS: 1.6 10*3/uL (ref 0.7–4.0)
LYMPHS PCT: 12 %
MCH: 27.2 pg (ref 26.0–34.0)
MCHC: 30.5 g/dL (ref 30.0–36.0)
MCV: 89 fL (ref 78.0–100.0)
MONO ABS: 1 10*3/uL (ref 0.1–1.0)
Monocytes Relative: 8 %
Neutro Abs: 10.9 10*3/uL — ABNORMAL HIGH (ref 1.7–7.7)
Neutrophils Relative %: 79 %
Platelets: 219 10*3/uL (ref 150–400)
RBC: 3.35 MIL/uL — AB (ref 4.22–5.81)
RDW: 16.3 % — AB (ref 11.5–15.5)
WBC: 13.7 10*3/uL — ABNORMAL HIGH (ref 4.0–10.5)

## 2016-07-24 LAB — MAGNESIUM: Magnesium: 1.7 mg/dL (ref 1.7–2.4)

## 2016-07-24 LAB — GLUCOSE, CAPILLARY
Glucose-Capillary: 237 mg/dL — ABNORMAL HIGH (ref 65–99)
Glucose-Capillary: 277 mg/dL — ABNORMAL HIGH (ref 65–99)
Glucose-Capillary: 201 mg/dL — ABNORMAL HIGH (ref 65–99)
Glucose-Capillary: 216 mg/dL — ABNORMAL HIGH (ref 65–99)

## 2016-07-24 MED ORDER — MAGNESIUM OXIDE 400 (241.3 MG) MG PO TABS
200.0000 mg | ORAL_TABLET | Freq: Every day | ORAL | Status: DC
  Administered 2016-07-24 – 2016-08-02 (×10): 200 mg via ORAL
  Filled 2016-07-24 (×10): qty 1

## 2016-07-24 MED ORDER — POLYETHYLENE GLYCOL 3350 17 G PO PACK
17.0000 g | PACK | Freq: Every day | ORAL | Status: DC
  Administered 2016-07-24 – 2016-08-02 (×8): 17 g via ORAL
  Filled 2016-07-24 (×9): qty 1

## 2016-07-24 MED ORDER — CARVEDILOL 6.25 MG PO TABS
6.2500 mg | ORAL_TABLET | Freq: Two times a day (BID) | ORAL | Status: DC
  Administered 2016-07-24 – 2016-08-02 (×18): 6.25 mg via ORAL
  Filled 2016-07-24 (×19): qty 1

## 2016-07-24 MED ORDER — HAEMOPHILUS B POLYSAC CONJ VAC IM SOLR
0.5000 mL | Freq: Once | INTRAMUSCULAR | Status: AC
  Administered 2016-07-28: 0.5 mL via INTRAMUSCULAR
  Filled 2016-07-24 (×2): qty 0.5

## 2016-07-24 MED ORDER — FUROSEMIDE 10 MG/ML IJ SOLN
20.0000 mg | Freq: Once | INTRAMUSCULAR | Status: AC
  Administered 2016-07-24: 20 mg via INTRAVENOUS
  Filled 2016-07-24: qty 2

## 2016-07-24 MED ORDER — SENNOSIDES-DOCUSATE SODIUM 8.6-50 MG PO TABS
1.0000 | ORAL_TABLET | Freq: Every day | ORAL | Status: DC
  Administered 2016-07-24: 1 via ORAL
  Filled 2016-07-24: qty 1

## 2016-07-24 MED ORDER — MENINGOCOCCAL A C Y&W-135 OLIG IM SOLR
0.5000 mL | Freq: Once | INTRAMUSCULAR | Status: AC
  Administered 2016-07-27: 0.5 mL via INTRAMUSCULAR
  Filled 2016-07-24 (×2): qty 0.5

## 2016-07-24 MED ORDER — GUAIFENESIN ER 600 MG PO TB12
600.0000 mg | ORAL_TABLET | Freq: Two times a day (BID) | ORAL | Status: DC
  Administered 2016-07-24 – 2016-08-02 (×19): 600 mg via ORAL
  Filled 2016-07-24 (×19): qty 1

## 2016-07-24 MED ORDER — CARVEDILOL 3.125 MG PO TABS
3.1250 mg | ORAL_TABLET | Freq: Once | ORAL | Status: DC
Start: 2016-07-24 — End: 2016-08-02
  Filled 2016-07-24: qty 1

## 2016-07-24 NOTE — Progress Notes (Signed)
11 Days Post-Op  Subjective: Pt alert and up to chair.  Eating breakfast.  Sites all look good.    Objective: Vital signs in last 24 hours: Temp:  [97.4 F (36.3 C)-99.7 F (37.6 C)] 98.5 F (36.9 C) (03/26 0400) Pulse Rate:  [81-101] 87 (03/26 0400) Resp:  [19-32] 23 (03/26 0400) BP: (99-123)/(36-66) 108/54 (03/26 0400) SpO2:  [94 %-100 %] 96 % (03/26 0400) Weight:  [83.3 kg (183 lb 10.3 oz)] 83.3 kg (183 lb 10.3 oz) (03/26 0600) Last BM Date: 07/22/16 50 IV No diet recorded 775 urine 15 from CT Afebrile, VSS Glucose 165 this AM  Ranges from 122-371 WBC 13.7 H/H is stable, Platelets 219  CXR:  Interim removal of bilateral chest tubes. No pneumothorax. Right PICC line stable position. Persistent bilateral pulmonary infiltrates and bilateral pleural effusions, improved slightly from prior exam. Persistent bibasilar atelectasis .  Intake/Output from previous day: 03/25 0701 - 03/26 0700 In: 50 [I.V.:10] Out: 790 [Urine:775; Chest Tube:15] Intake/Output this shift: No intake/output data recorded.  General appearance: alert, cooperative and no distress Abdomen:  Soft, port sites all look good.  Tolerating diet.  BM not recorded since 07/21/16    Lab Results:   Recent Labs  07/23/16 0500 07/24/16 0355  WBC 12.7* 13.7*  HGB 9.5* 9.1*  HCT 31.3* 29.8*  PLT 266 219    BMET  Recent Labs  07/23/16 0500 07/24/16 0355  NA 142 140  K 3.9 4.0  CL 104 103  CO2 33* 32  GLUCOSE 193* 165*  BUN 36* 33*  CREATININE 0.80 0.81  CALCIUM 8.7* 8.5*   PT/INR No results for input(s): LABPROT, INR in the last 72 hours.   Recent Labs Lab 07/22/16 0442  AST 13*  ALT 11*  ALKPHOS 55  BILITOT 0.9  PROT 5.3*  ALBUMIN 2.6*     Lipase  No results found for: LIPASE   Studies/Results: Dg Chest Port 1 View  Result Date: 07/24/2016 CLINICAL DATA:  Respiratory failure. EXAM: PORTABLE CHEST 1 VIEW COMPARISON:  07/23/2016. FINDINGS: Interim removal of bilateral chest  2. Right PICC line stable position. Cardiomegaly again noted. Persistent but improved bilateral pulmonary infiltrates. Persistent basilar atelectasis . Persistent bilateral pleural effusions, right side greater than left. Bilateral pleural effusions are again noted . IMPRESSION: 1. Interim removal of bilateral chest tubes. No pneumothorax. Right PICC line stable position. 2. Persistent bilateral pulmonary infiltrates and bilateral pleural effusions, improved slightly from prior exam. Persistent bibasilar atelectasis . Electronically Signed   By: Marcello Moores  Register   On: 07/24/2016 07:06   Dg Chest Port 1 View  Result Date: 07/23/2016 CLINICAL DATA:  Acute respiratory failure. COPD. Pleural effusions. EXAM: PORTABLE CHEST 1 VIEW COMPARISON:  07/22/2016. FINDINGS: No change cardiomediastinal silhouette. Decreasing BILATERAL pleural effusions. Improved aeration of both bases. RIGHT remains worse than LEFT. BILATERAL chest tubes in good position. No pneumothorax. PICC line stable. Unchanged cardiomediastinal silhouette. IMPRESSION: Slight improvement aeration Electronically Signed   By: Staci Righter M.D.   On: 07/23/2016 07:15    Medications: . atorvastatin  40 mg Per Tube q1800  . budesonide (PULMICORT) nebulizer solution  0.25 mg Nebulization BID  . carvedilol  3.125 mg Oral BID WC  . Chlorhexidine Gluconate Cloth  6 each Topical Daily  . ciprofloxacin  500 mg Oral BID  . heparin subcutaneous  5,000 Units Subcutaneous Q8H  . insulin aspart  0-20 Units Subcutaneous TID WC  . insulin glargine  8 Units Subcutaneous Daily  . ipratropium  0.5  mg Nebulization TID  . lactose free nutrition  237 mL Oral TID BM  . levalbuterol  1.25 mg Nebulization TID  . loratadine  10 mg Oral Daily  . mouth rinse  15 mL Mouth Rinse BID  . multivitamin with minerals  1 tablet Oral Daily  . QUEtiapine  50 mg Per Tube QHS  . sodium chloride flush  10-40 mL Intracatheter Q12H   NO IV fluids Urine culture:  No  growth Specimen Description PLEURAL   07/14/16  Special Requests NONE   Gram Stain NO WBC SEEN  RARE GRAM NEGATIVE RODS  Performed at [facility], [place] [address] [place], [place] [address]      Culture ABUNDANT PSEUDOMONAS AERUGINOSA    Specimen Description LEFT ANTECUBITAL   Blood cultures 07/14/16  Special Requests BOTTLES DRAWN AEROBIC AND ANAEROBIC 3CC   Culture NO GROWTH 5 DAYS        Assessment/Plan 1.  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 Barry Dienes,   POD 11   2.  Atrial fibrillation with RVR  - normal EF with grade 2 diastolic dysfunction, calcified mitral annulus with trivial MR. Trivial TR 3.  R>L pleural effusions/status post ultrasound-guided left thoracentesis 06/14/16;1.5 liters Pathology: REACTIVE MESOTHELIAL CELLS PRESENT, MIXED LYMPHOID POPULATION. Post op empyema/pneumonia  - Bilateral CT placement - hydropneumothorax 07/17/16 4. Post op delerium 5.  COPD/History tobacco use - 42-pack-year history 6.  Hypertension 7.  Hyperlipidemia 8.  Type 2 diabetes mellitus. 9.  Bilateral lower extremity edema/fluid overload - pre op & post op FEN:  Carb Mod diet ID:  Day 11 abx, Currently on Cipro day 3 DVT:  Heparin  Plan:  Doing well from surgical standpoint.  Dr. Erlinda Hong is asking about discharge from Surgical standpoint and who will be doing post splenectomy vaccinations.  Dr. Barry Dienes will see shortly.    LOS: 15 days    Christean Silvestri 07/24/2016 [phone]

## 2016-07-24 NOTE — Progress Notes (Signed)
Inpatient Diabetes Program Recommendations  AACE/ADA: New Consensus Statement on Inpatient Glycemic Control (2015)  Target Ranges:  Prepandial:   less than 140 mg/dL      Peak postprandial:   less than 180 mg/dL (1-2 hours)      Critically ill patients:  140 - 180 mg/dL   Lab Results  Component Value Date   GLUCAP 237 (H) 07/24/2016   HGBA1C 6.6 (H) 07/09/2016    Review of Glycemic Control Results for [name], [name] (MRN [id number]) as of 07/24/2016 10:26  Ref. Range 07/23/2016 08:00 07/23/2016 13:04 07/23/2016 16:50 07/23/2016 22:07 07/24/2016 08:34  Glucose-Capillary Latest Ref Range: 65 - 99 mg/dL 206 (H) 245 (H) 371 (H) 122 (H) 237 (H)   Post-prandial blood sugars elevated. Lantus increased to 15 units this am. May benefit from meal coverage insulin.  Inpatient Diabetes Program Recommendations:    Novolog 3 units tidwc for meal coverage insulin if pt eats > 50% meal.  Continue to follow.  Thank you. Lorenda Peck, RD, LDN, CDE Inpatient Diabetes Coordinator [phone]

## 2016-07-24 NOTE — Progress Notes (Addendum)
PROGRESS NOTE    [name]  GGE:[id number] DOB: 15-Mar-1941 DOA: 07/08/2016 PCP: [name], MD  Brief Narrative:  [name] is a 76 y.o. male with medical history significant of hypertension, hyperlipidemia, diabetes mellitus (possible steroid induced), COPD  (home o2 3liters started in 06/2016), bronchiectasis, GERD, idiopathic thrombocytopenia, dCHF, bilateral pleural effusion,PSVT who presents with shortness of breath and bilateral leg edema.  He was recently hospitalized from 2/10 to 2/20 for pleural effusion( had thoracentesis and diuresed), SVT, ITP, he Returns to the ED on 3/11 for HR >150 noted while he was checking Vitals at home. ER performed electrical cardioverstion due to concomitant complaint of dyspnea and hypotension, he was started on Cardizem and amiodarone infusion and admitted to SDU under hospitalist service.   Has severe refractory ITP so Oncology consulted and recommended Splenectomy so General Surgery consulted patient to undergo splenectomy on 3/15. Patient is s/p Left Thoracentesis 3/14, culture+ pseudomonas. He is transferred to ICU intubated post op. He self extubated on 3/15, had to be reintubated due to respiratory failure. He has bilatearal chest tube placed on 3/16, he received TPA/DNAse due to loculated pleural effusion, ( he is not a candidate for VATs per pulmonology) He has improved, extubated on 3/22 and transferred to hospitalist service on 3/25  Pulmonology continue to follow for bilateral chest tube management Cardiology continue to follow for afib General surgery continue to follow , s/p LAPAROSCOPIC SPLENECTOMY for ITP on 07/13/2016 [facility] Oncology Dr Gudena/Dr Magrinat/Lindsey Holley Dexter,  NP follow peripherally now (if any hematology issues arise [phone].)   Assessment & Plan:   Principal Problem:   Chronic ITP (idiopathic thrombocytopenia) ([place]), s/p splenectomy on 3/15 Active Problems:   Paroxysmal SVT  (supraventricular tachycardia) (HCC)   Chronic diastolic CHF (congestive heart failure) ([place])   Diabetes mellitus without complication (HCC)   GERD (gastroesophageal reflux disease)   Empyema ([place])   Ventilator dependence ([place]) d/t Empyema, and volume overload. Complicated further by baseline COPD and Bronchiectasis    Disorders of fluid, electrolyte, and acid-base balance   Anemia in acute on chronic illness   Severe protein-calorie malnutrition (HCC)   Anasarca   Severe muscle deconditioning   Septic shock (HCC)   Acute encephalopathy   Arterial hypotension   Pneumonia  SVT  At home and in ER (presenting symptom) Paroxysmal A-fib with RVR subsequently -  s/p electrical cardioversion in ER following by Cardizem (stopped on 3/17)/amiodarone infusion (stopped on 3/21) - now on coreg, CHADSVASC score 4( age, htn,dm2, diastolic chf), will discuss with cardiology about anticoagulation, now that his thrombocytopenia has resolved. -echo with lvef 60-65%, grade II diastolic dysfunction - cardiology continue following daily, input appreciated   ITP  - failed multiple line of medical therapy,  plt count 5 on admission, nose bleed (was on around clock amicar), blood loss anemia - s/p Transfusions of 4 pools of Platelets s/p 4prbc transfusion, s/p Splenectomy on 3/15 -plt has been normalized sicne 3/22 -surgery continue to follow daily, oncology available as needed, general surgery to address post splenectomy vaccinations ( I have discussed with general surgery PA on 3/26)  Hypoxic respiratory failure/septic shock Self extubated post op on 3/15, but reintubated due to hypoxic respiratory failure and septic shock Improving, extubated on 3/22, on abx for pna/empyema, blood culture no growth, procaltitonin trending down. Currently on 2liter o2supplement at rest, his o2 sats dropped to 86% briefly with increased activity on 3/26  B/l pleural effusions, hydropneumothorax , bilateral pneumonia with  pseudomonas empyema, dCHF- chronic,  2+ pedal edema, anasarca - -S/p Left Thoracentesis done by IR  3/14 after platelet infusions , -s/p bilateral chest tube placed on 3/16 with TPA/DNAse ( not a candidate for VATs per pulmonology), cytology negative,  culture + psuedomonas, abx 10/14 as of 3/26, chest tubes removed on 3/25, pulmonology input appreciated, cxr on 3/26 with "Persistent bilateral pulmonary infiltrates and bilateral pleural effusions, improved slightly from prior exam. Persistent bibasilar atelectasis ." wbc has been around 13 for the 3-4 days, last procacitonin 0.18, no fever, will discuss with pulmonology regarding Ct chest. -  Cardiology consulted and Discontinued Lasix (last dose given on 3/18) per Cardiology note, negative fluids balance.   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 hypercarbia  - stable on atrovent/xopenex -pulmonology following daily   ICU delirium:  required precedex and seroquel Improving, on seroquel 50mg  qhs now    insulin dependent Diabetes mellitus( prednisone induced?) - A1c 6.6 - off steroid,  -continue adjust lantus/ssi  HLD continue statin  Abnormal thyroid functions tsh 5, free t4 1.39 - recommend these be rechecked in 3-4 wks  Deconditioning: PT/SNF  DVT prophylaxis: SCDs then heparin Code Status: FULL CODE Family Communication: No Family present at bedside Disposition Plan:  Transfer to med tele, will need SNF at discharge; will need general surgery/pccm/cardiology/oncology clearance for discharge  Consultants:   Cardiology  Oncology  General Surgery  Interventional Radiology  Pulmonology /critical care   Procedures:   Left Thoracentesis by IR on 3/14  s/p Transfusions of 4 pools of Platelets   s/p 4prbc transfusion  s/p Splenectomy on 3/15  intubation on 3/15, self extubation on 3/15, reintubation on 3/15, extubation on 3/22  Bilateral chest tube placement on 3/16, removed on 3/25  R picc line  placement on 3/17  Aline discontinued on 3/23   Antimicrobials:  Anti-infectives    Start     Dose/Rate Route Frequency Ordered Stop   07/22/16 1400  ciprofloxacin (CIPRO) tablet 500 mg     500 mg Oral 2 times daily 07/22/16 1223 07/28/16 2359   07/16/16 1200  cefTAZidime (FORTAZ) 2 g in dextrose 5 % 50 mL IVPB  Status:  Discontinued     2 g 100 mL/hr over 30 Minutes Intravenous Every 8 hours 07/16/16 0942 07/22/16 1207   07/15/16 0600  vancomycin (VANCOCIN) 1,250 mg in sodium chloride 0.9 % 250 mL IVPB  Status:  Discontinued     1,250 mg 166.7 mL/hr over 90 Minutes Intravenous Every 12 hours 07/14/16 1901 07/16/16 0942   07/15/16 0200  piperacillin-tazobactam (ZOSYN) IVPB 3.375 g  Status:  Discontinued     3.375 g 12.5 mL/hr over 240 Minutes Intravenous Every 8 hours 07/14/16 1901 07/16/16 0942   07/14/16 1930  vancomycin (VANCOCIN) 1,500 mg in sodium chloride 0.9 % 500 mL IVPB     1,500 mg 250 mL/hr over 120 Minutes Intravenous  Once 07/14/16 1901 07/14/16 2251   07/14/16 1930  piperacillin-tazobactam (ZOSYN) IVPB 3.375 g     3.375 g 100 mL/hr over 30 Minutes Intravenous  Once 07/14/16 1901 07/14/16 2058     Subjective:  Sitting up in chair, having breakfast, o2 sats dropped to 86% briefly with increased activity this am, still come congested cough, nonproductive, denies chest pain, no fever, no significant edema,  c/o being constipated   Objective: Vitals:   07/23/16 2300 07/24/16 0000 07/24/16 0400 07/24/16 0600  BP:  (!) 102/36 (!) 108/54   Pulse:  86 87   Resp:  (!) 24 (!) 23  Temp:  99.7 F (37.6 C) 98.5 F (36.9 C)   TempSrc:  Axillary Axillary   SpO2: 97% 97% 96%   Weight:    83.3 kg (183 lb 10.3 oz)  Height:        Intake/Output Summary (Last 24 hours) at 07/24/16 0757 Last data filed at 07/23/16 2300  Gross per 24 hour  Intake               50 ml  Output              790 ml  Net             -740 ml   Filed Weights   07/22/16 0400 07/23/16 0500  07/24/16 0600  Weight: 79.5 kg (175 lb 4.3 oz) 79.9 kg (176 lb 2.4 oz) 83.3 kg (183 lb 10.3 oz)   Examination: Physical Exam:  Constitutional: Appears calm and in no acute distress Eyes: Lids and conjunctivae normal, sclerae anicteric  ENMT: External Ears, Nares appear to have dry blood.  Neck: Appears normal, supple, no cervical masses, normal ROM, no appreciable thyromegaly Respiratory: Diminished at basis bilaterally, no wheezing, bilateral chest tubes has removed on 3/25.  Cardiovascular: RRR, .previously documented  2+ Lower Extremity Edema has resolved.  Abdomen: Soft, non-tender, non-distended. +bs, post op changes.  GU: Deferred. Musculoskeletal: No clubbing / cyanosis of digits/nails. No joint deformity upper and lower extremities. Skin: No rashes, lesions, ulcers on limited skin evaluation. No induration; Warm and dry.  Neurologic: CN 2-12 grossly intact with no focal deficits. Romberg sign cerebellar reflexes not assessed.  Psychiatric: Normal judgment and insight. Alert and oriented x 3. Normal mood and appropriate affect.   Data Reviewed: I have personally reviewed following labs and imaging studies  CBC:  Recent Labs Lab 07/18/16 0344 07/19/16 0522 07/20/16 0437 07/22/16 0442 07/23/16 0500 07/24/16 0355  WBC 13.2* 11.8* 13.4* 13.8* 12.7* 13.7*  NEUTROABS 12.4* 10.2* 11.7*  --   --  10.9*  HGB 8.9* 9.3* 9.1* 9.9* 9.5* 9.1*  HCT 28.3* 29.6* 29.2* 33.0* 31.3* 29.8*  MCV 84.7 87.1 89.0 90.2 89.4 89.0  PLT 105* 143* 187 281 266 660   Basic Metabolic Panel:  Recent Labs Lab 07/17/16 1054  07/18/16 0344 07/18/16 0921 07/18/16 1700  07/20/16 1001 07/21/16 0511 07/22/16 0442 07/23/16 0500 07/24/16 0355  NA 136  < >  --  141  --   < > 149* 147* 144 142 140  K 3.2*  < >  --  3.4*  --   < > 3.1* 3.9 3.8 3.9 4.0  CL 93*  < >  --  96*  --   < > 108 110 105 104 103  CO2 35*  < >  --  38*  --   < > 34* 32 32 33* 32  GLUCOSE 210*  < >  --  426*  --   < > 237* 272*  227* 193* 165*  BUN 24*  < >  --  28*  --   < > 41* 40* 39* 36* 33*  CREATININE 0.77  < > 0.87 0.91  --   < > 0.90 0.90 0.85 0.80 0.81  CALCIUM 8.1*  < >  --  8.6*  --   < > 9.0 8.8* 8.7* 8.7* 8.5*  MG 1.8  < > 2.0 1.9 2.1  --   --   --   --  1.7 1.7  PHOS 2.9  --  1.7*  --  2.9  --   --   --   --  2.5  --   < > = values in this interval not displayed. GFR: Estimated Creatinine Clearance: 85.2 mL/min (by C-G formula based on SCr of 0.81 mg/dL). Liver Function Tests:  Recent Labs Lab 07/22/16 0442  AST 13*  ALT 11*  ALKPHOS 55  BILITOT 0.9  PROT 5.3*  ALBUMIN 2.6*   No results for input(s): LIPASE, AMYLASE in the last 168 hours. No results for input(s): AMMONIA in the last 168 hours. Coagulation Profile: No results for input(s): INR, PROTIME in the last 168 hours. Cardiac Enzymes: No results for input(s): CKTOTAL, CKMB, CKMBINDEX, TROPONINI in the last 168 hours. BNP (last 3 results)  Recent Labs  02/17/16 0948 05/05/16 1450  PROBNP 40.0 17.0   HbA1C: No results for input(s): HGBA1C in the last 72 hours. CBG:  Recent Labs Lab 07/22/16 2235 07/23/16 0800 07/23/16 1304 07/23/16 1650 07/23/16 2207  GLUCAP 138* 206* 245* 371* 122*   Lipid Profile: No results for input(s): CHOL, HDL, LDLCALC, TRIG, CHOLHDL, LDLDIRECT in the last 72 hours. Thyroid Function Tests: No results for input(s): TSH, T4TOTAL, FREET4, T3FREE, THYROIDAB in the last 72 hours. Anemia Panel: No results for input(s): VITAMINB12, FOLATE, FERRITIN, TIBC, IRON, RETICCTPCT in the last 72 hours. Sepsis Labs:  Recent Labs Lab 07/21/16 1805 07/22/16 0442 07/23/16 0500  PROCALCITON 0.21 0.28 0.18    Recent Results (from the past 240 hour(s))  Body fluid culture     Status: None   Collection Time: 07/14/16  9:51 AM  Result Value Ref Range Status   Specimen Description PLEURAL  Final   Special Requests NONE  Final   Gram Stain   Final    NO WBC SEEN RARE GRAM NEGATIVE RODS Performed at [facility], [place] [address], [place], [place] [address]    Culture ABUNDANT PSEUDOMONAS AERUGINOSA  Final   Report Status 07/17/2016 FINAL  Final   Organism ID, Bacteria PSEUDOMONAS AERUGINOSA  Final      Susceptibility   Pseudomonas aeruginosa - MIC*    CEFTAZIDIME 4 SENSITIVE Sensitive     CIPROFLOXACIN <=0.25 SENSITIVE Sensitive     GENTAMICIN <=1 SENSITIVE Sensitive     IMIPENEM 0.5 SENSITIVE Sensitive     PIP/TAZO <=4 SENSITIVE Sensitive     CEFEPIME 2 SENSITIVE Sensitive     * ABUNDANT PSEUDOMONAS AERUGINOSA  Culture, blood (Routine X 2) w Reflex to ID Panel     Status: None   Collection Time: 07/14/16  6:40 PM  Result Value Ref Range Status   Specimen Description BLOOD LEFT ANTECUBITAL  Final   Special Requests IN PEDIATRIC BOTTLE 3CC  Final   Culture   Final    NO GROWTH 5 DAYS Performed at [facility], [address], [place], [place] [address]    Report Status 07/19/2016 FINAL  Final  Culture, blood (Routine X 2) w Reflex to ID Panel     Status: None   Collection Time: 07/14/16  7:45 PM  Result Value Ref Range Status   Specimen Description LEFT ANTECUBITAL  Final   Special Requests BOTTLES DRAWN AEROBIC AND ANAEROBIC 3CC  Final   Culture   Final    NO GROWTH 5 DAYS Performed at [facility], [place] [address], [place], [place] [address]    Report Status 07/19/2016 FINAL  Final  Culture, Urine     Status: None   Collection Time: 07/15/16  5:47  AM  Result Value Ref Range Status   Specimen Description URINE, RANDOM  Final   Special Requests NONE  Final   Culture   Final    NO GROWTH Performed at [facility], [address], [place],  [address]    Report Status 07/16/2016 FINAL  Final    Radiology Studies: Dg Chest Port 1 View  Result Date: 07/24/2016 CLINICAL DATA:  Respiratory failure. EXAM: PORTABLE CHEST 1 VIEW COMPARISON:  07/23/2016. FINDINGS: Interim removal of bilateral chest 2. Right PICC line stable position. Cardiomegaly again  noted. Persistent but improved bilateral pulmonary infiltrates. Persistent basilar atelectasis . Persistent bilateral pleural effusions, right side greater than left. Bilateral pleural effusions are again noted . IMPRESSION: 1. Interim removal of bilateral chest tubes. No pneumothorax. Right PICC line stable position. 2. Persistent bilateral pulmonary infiltrates and bilateral pleural effusions, improved slightly from prior exam. Persistent bibasilar atelectasis . Electronically Signed   By: Marcello Moores  Register   On: 07/24/2016 07:06   Dg Chest Port 1 View  Result Date: 07/23/2016 CLINICAL DATA:  Acute respiratory failure. COPD. Pleural effusions. EXAM: PORTABLE CHEST 1 VIEW COMPARISON:  07/22/2016. FINDINGS: No change cardiomediastinal silhouette. Decreasing BILATERAL pleural effusions. Improved aeration of both bases. RIGHT remains worse than LEFT. BILATERAL chest tubes in good position. No pneumothorax. PICC line stable. Unchanged cardiomediastinal silhouette. IMPRESSION: Slight improvement aeration Electronically Signed   By: Staci Righter M.D.   On: 07/23/2016 07:15   Scheduled Meds: . atorvastatin  40 mg Per Tube q1800  . budesonide (PULMICORT) nebulizer solution  0.25 mg Nebulization BID  . carvedilol  3.125 mg Oral BID WC  . Chlorhexidine Gluconate Cloth  6 each Topical Daily  . ciprofloxacin  500 mg Oral BID  . heparin subcutaneous  5,000 Units Subcutaneous Q8H  . insulin aspart  0-20 Units Subcutaneous TID WC  . insulin glargine  8 Units Subcutaneous Daily  . ipratropium  0.5 mg Nebulization TID  . lactose free nutrition  237 mL Oral TID BM  . levalbuterol  1.25 mg Nebulization TID  . loratadine  10 mg Oral Daily  . mouth rinse  15 mL Mouth Rinse BID  . multivitamin with minerals  1 tablet Oral Daily  . QUEtiapine  50 mg Per Tube QHS  . sodium chloride flush  10-40 mL Intracatheter Q12H   Continuous Infusions:   LOS: 15 days    Time spent> 77mins Vincient Vanaman, MD PhD [facility] Pager [phone]  If 7PM-7AM, please contact night-coverage [email or web address] Password [facility] 07/24/2016, 7:57 AM

## 2016-07-24 NOTE — Progress Notes (Signed)
qPhysical Therapy Treatment Patient Details Name: [name] MRN: [id number] DOB: [birth year] Today's Date: 07/24/2016    History of Present Illness [name] is a 76 y.o. male with medical history significant of recently diagnosed with ITP, bronchiectasis, hypertension, diabetes mellitus type 2, afib,Presented to the ED 3/10/18complaining of shortness of breath. 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 Complicated further by baseline COPD and Bronchiectasis . Extubated 07/20/16. Bilateral chest tubes in place.    PT Comments    The patient is stronger today. Beginning short distance ambulatiopn on O2 with dropping to 85%. Continue PT while in acute care.    Follow Up Recommendations  SNF;Supervision/Assistance - 24 hour     Equipment Recommendations  None recommended by PT    Recommendations for Other Services       Precautions / Restrictions Precautions Precautions: Fall Precaution Comments: monitor HR and sats,     Mobility  Bed Mobility               General bed mobility comments: in recliner  Transfers Overall transfer level: Needs assistance Equipment used: Rolling walker (2 wheeled) Transfers: Sit to/from Stand Sit to Stand: Min assist         General transfer comment: assist to rise and steady, practiced x 2 from recliner. Cues for hand position  Ambulation/Gait Ambulation/Gait assistance: Min assist;+2 safety/equipment Ambulation Distance (Feet): 20 Feet Assistive device: Rolling walker (2 wheeled) Gait Pattern/deviations: Step-to pattern;Step-through pattern     General Gait Details: cues for posture. Second  activity was to march in place 5 steps x 3  practices due to the lines were too much to keep  untangled. Patient on 4.5 liters [place], dyspnea 3/4, sats dropped to 85% for first walk.  HR 113.   Stairs            Wheelchair Mobility    Modified Rankin (Stroke Patients  Only)       Balance                                            Cognition Arousal/Alertness: Awake/alert                                            Exercises General Exercises - Upper Extremity Shoulder Flexion: AROM;Both;5 reps (to 90*) Shoulder ABduction: AROM;Both;5 reps (to 90*) General Exercises - Lower Extremity Long Arc Quad: AROM;Both;5 reps;Seated Hip Flexion/Marching: AROM;5 reps;Both;Seated    General Comments        Pertinent Vitals/Pain Faces Pain Scale: Hurts little more Pain Location: sidees where CT's were located. Pain Descriptors / Indicators: Discomfort Pain Intervention(s): Monitored during session    Home Living                      Prior Function            PT Goals (current goals can now be found in the care plan section) Progress towards PT goals: Progressing toward goals    Frequency    Min 3X/week      PT Plan Discharge plan needs to be updated    Co-evaluation             End of Session Equipment  Utilized During Treatment: Oxygen Activity Tolerance: Patient tolerated treatment well;Patient limited by fatigue Patient left: in chair;with call bell/phone within reach;with nursing/sitter in room;with family/visitor present Nurse Communication: Mobility status PT Visit Diagnosis: Other abnormalities of gait and mobility (R26.89);Muscle weakness (generalized) (M62.81);Pain Pain - Right/Left: Left     Time: 1991-4445 PT Time Calculation (min) (ACUTE ONLY): 34 min  Charges:  $Gait Training: 23-37 mins                    G CodesTresa Endo PT [phone]    Claretha Cooper 07/24/2016, 12:47 PM

## 2016-07-24 NOTE — Progress Notes (Signed)
Nutrition Follow-up  INTERVENTION:   Continue Boost Plus chocolate- Each supplement provides 360kcal and 14g protein.   Will monitor for additional nutrition needs  NUTRITION DIAGNOSIS:   Inadequate oral intake related to inability to eat as evidenced by NPO status.  Now on CHO modified diet.  GOAL:   Patient will meet greater than or equal to 90% of their needs  Progressing, eating 100% of meals.  MONITOR:   Diet advancement, PO intake, Weight trends, Labs, Skin, I & O's  ASSESSMENT:   76 y.o. male with medical history significant of hypertension, hyperlipidemia, diabetes mellitus, COPD, GERD, idiopathic thrombocytopenia, bilateral pleural effusion, bronchiectasis, PSVT, dCHF, who presents with shortness of breath and bilateral leg edema.  Patient now on a CHO modified diet and eating 100% of meals at this time. Pt is receiving Boost Plus supplements and drinking them most of the time provided.  Medications: MAG-OX tablet daily, Multivitamin with minerals daily, Miralax packet daily, Senokot-S tablet daily,  Labs reviewed: CBGs: 237-277  Diet Order:  Diet Carb Modified Fluid consistency: Thin; Room service appropriate? Yes  Skin:  Wound (see comment) (Abdominal incision from lap splenectomy 07/13/16)  Last BM:  3/24  Height:   Ht Readings from Last 1 Encounters:  07/14/16 5\' 11"  (1.803 m)    Weight:   Wt Readings from Last 1 Encounters:  07/24/16 183 lb 10.3 oz (83.3 kg)    Ideal Body Weight:  78.18 kg  BMI:  Body mass index is 25.61 kg/m.  Estimated Nutritional Needs:   Kcal:  2090-2260 (25-27 kcal/kg)  Protein:  100-117 grams (1.2-1.4 grams/kg)  Fluid:  per MD/NP given edema and need for Lasix drip  EDUCATION NEEDS:   No education needs identified at this time  Clayton Bibles, MS, RD, LDN Pager: [phone] After Hours Pager: [phone]

## 2016-07-24 NOTE — Progress Notes (Signed)
Progress Note  Patient Name: [name] Date of Encounter: 07/24/2016  Primary Cardiologist: Dr Haroldine Laws, Dr Tamala Julian  Patient Profile     76 y.o. male w/ hx HTN, HLD, Type 2 DM, history of pSVT,and ITP, admitted 07/08/16 with recurrent PSVT and hypotension-required DCCV in ED. HR difficult to control, converted to NSR with IV Amiodarone>>d/c'd 03/21. On low-dose Coreg. No anticoagulation with undetectable PTLs and active bleeding requiring transfusion of blood and platelets. Recurrent bilateral pleural effusions>> s/p thoracentesis 3/14 in preparation for splenectomy on 3/15.  Subjective   Breathing a little better than yesterday, no chest pain or palpitations.  Inpatient Medications    Scheduled Meds: . atorvastatin  40 mg Per Tube q1800  . budesonide (PULMICORT) nebulizer solution  0.25 mg Nebulization BID  . carvedilol  3.125 mg Oral BID WC  . Chlorhexidine Gluconate Cloth  6 each Topical Daily  . ciprofloxacin  500 mg Oral BID  . guaiFENesin  600 mg Oral BID  . heparin subcutaneous  5,000 Units Subcutaneous Q8H  . insulin aspart  0-20 Units Subcutaneous TID WC  . insulin glargine  8 Units Subcutaneous Daily  . ipratropium  0.5 mg Nebulization TID  . lactose free nutrition  237 mL Oral TID BM  . levalbuterol  1.25 mg Nebulization TID  . loratadine  10 mg Oral Daily  . mouth rinse  15 mL Mouth Rinse BID  . multivitamin with minerals  1 tablet Oral Daily  . polyethylene glycol  17 g Oral Daily  . QUEtiapine  50 mg Per Tube QHS  . senna-docusate  1 tablet Oral QHS  . sodium chloride flush  10-40 mL Intracatheter Q12H   Continuous Infusions:  PRN Meds: acetaminophen, bisacodyl, fentaNYL (SUBLIMAZE) injection, levalbuterol, metoprolol, sodium chloride flush   Vital Signs    Vitals:   07/24/16 0000 07/24/16 0400 07/24/16 0600 07/24/16 0830  BP: (!) 102/36 (!) 108/54  126/71  Pulse: 86 87  98  Resp: (!) 24 (!) 23    Temp: 99.7 F (37.6 C) 98.5 F (36.9 C)      TempSrc: Axillary Axillary    SpO2: 97% 96%    Weight:   183 lb 10.3 oz (83.3 kg)   Height:        Intake/Output Summary (Last 24 hours) at 07/24/16 0845 Last data filed at 07/23/16 2300  Gross per 24 hour  Intake               50 ml  Output              775 ml  Net             -725 ml   Filed Weights   07/22/16 0400 07/23/16 0500 07/24/16 0600  Weight: 175 lb 4.3 oz (79.5 kg) 176 lb 2.4 oz (79.9 kg) 183 lb 10.3 oz (83.3 kg)    Telemetry    SR, ST - Personally Reviewed  ECG    N/A - Personally Reviewed  Physical Exam   General: Well developed, well nourished, male appearing in no acute distress. Head: Normocephalic, atraumatic.  Neck: Supple without bruits, JVD 8-9 CM. Lungs:  Resp regular and unlabored, bilateral rales and rhonchi. Heart: RRR, S1, S2, no S3, S4, 2/6 murmur; no rub. Abdomen: Soft, tender, non-distended with + bowel sounds. No hepatomegaly. No rebound/guarding. No obvious abdominal masses. Extremities: No clubbing, cyanosis, no edema. Distal pedal pulses are 2+ bilaterally. Neuro: Alert and oriented X 3. Moves all extremities spontaneously. Psych:  Normal affect.  Labs    Hematology  Recent Labs Lab 07/22/16 0442 07/23/16 0500 07/24/16 0355  WBC 13.8* 12.7* 13.7*  RBC 3.66* 3.50* 3.35*  HGB 9.9* 9.5* 9.1*  HCT 33.0* 31.3* 29.8*  MCV 90.2 89.4 89.0  MCH 27.0 27.1 27.2  MCHC 30.0 30.4 30.5  RDW 16.6* 16.4* 16.3*  PLT 281 266 219    Chemistry  Recent Labs Lab 07/22/16 0442 07/23/16 0500 07/24/16 0355  NA 144 142 140  K 3.8 3.9 4.0  CL 105 104 103  CO2 32 33* 32  GLUCOSE 227* 193* 165*  BUN 39* 36* 33*  CREATININE 0.85 0.80 0.81  CALCIUM 8.7* 8.7* 8.5*  PROT 5.3*  --   --   ALBUMIN 2.6*  --   --   AST 13*  --   --   ALT 11*  --   --   ALKPHOS 55  --   --   BILITOT 0.9  --   --   GFRNONAA >60 >60 >60  GFRAA >60 >60 >60  ANIONGAP 7 5 5     Magnesium  Date Value Ref Range Status  07/24/2016 1.7 1.7 - 2.4 mg/dL Final     Radiology    Dg Chest Port 1 View Result Date: 07/24/2016 CLINICAL DATA:  Respiratory failure. EXAM: PORTABLE CHEST 1 VIEW COMPARISON:  07/23/2016. FINDINGS: Interim removal of bilateral chest 2. Right PICC line stable position. Cardiomegaly again noted. Persistent but improved bilateral pulmonary infiltrates. Persistent basilar atelectasis . Persistent bilateral pleural effusions, right side greater than left. Bilateral pleural effusions are again noted . IMPRESSION: 1. Interim removal of bilateral chest tubes. No pneumothorax. Right PICC line stable position. 2. Persistent bilateral pulmonary infiltrates and bilateral pleural effusions, improved slightly from prior exam. Persistent bibasilar atelectasis . Electronically Signed   By: Marcello Moores  Register   On: 07/24/2016 07:06   Dg Chest Port 1 View Result Date: 07/23/2016 CLINICAL DATA:  Acute respiratory failure. COPD. Pleural effusions. EXAM: PORTABLE CHEST 1 VIEW COMPARISON:  07/22/2016. FINDINGS: No change cardiomediastinal silhouette. Decreasing BILATERAL pleural effusions. Improved aeration of both bases. RIGHT remains worse than LEFT. BILATERAL chest tubes in good position. No pneumothorax. PICC line stable. Unchanged cardiomediastinal silhouette. IMPRESSION: Slight improvement aeration Electronically Signed   By: Staci Righter M.D.   On: 07/23/2016 07:15   Cardiac Studies   Echocardiogram: 07/09/2016 Study Conclusions - Left ventricle: The cavity size was normal. Wall thickness was increased in a pattern of mild LVH. Systolic function was normal. The estimated ejection fraction was in the range of 60% to 65%. Although no diagnostic regional wall motion abnormality was identified, this possibility cannot be completely excluded on the basis of this study. Features are consistent with a pseudonormal left ventricular filling pattern, with concomitant abnormal relaxation and increased filling pressure (grade 2  diastolicdysfunction). - Mitral valve: Calcified annulus. Mildly calcified leaflets . There was trivial regurgitation. - Right ventricle: The cavity size was mildly dilated. - Right atrium: Central venous pressure (est): 3 mm Hg. - Atrial septum: No defect or patent foramen ovale was identified. - Tricuspid valve: There was trivial regurgitation. - Pericardium, extracardiac: There was a right pleural effusion. Impressions: - Overall limited images. Mild LVH with LVEF 60-65%. Probable grade 2 diastolic dysfunction. Calcified mitral annulus with trivial mitral regurgitation. Aortic valve is moderately calcified and not well seen. Right ventricle appears mildly dilated. Trivial tricuspid regurgitation. Right pleural effusion is evident.  Patient Profile     76 y.o. male w/ hx  HTN, HLD, Type 2 DM, history of pSVT,and ITP, admitted 07/08/16 with recurrent PSVT and hypotension-required DCCV in ED. HR difficult to control, converted to NSR with IV Amiodarone>>d/c'd 03/21. On low-dose Coreg. No anticoagulation with undetectable PTLs and active bleeding requiring transfusion of blood and platelets. Recurrent bilateral pleural effusions>> s/p thoracentesis 3/14 in preparation for splenectomy on 3/15.  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 Complicated further by baseline COPD and Bronchiectasis    Disorders of fluid, electrolyte, and acid-base balance   Anemia in acute on chronic illness   Severe protein-calorie malnutrition (HCC)   Anasarca   Severe muscle deconditioning   Septic shock (HCC)   Acute  encephalopathy   Arterial hypotension   Pneumonia  1. Atrial Fibrillation with RVR vs. PSVT -- on my review, this appears more c/w SVT - mostly SR no afib vs PSVT >24 hr.  - prev not a candidate for anticoagulation with severe ITP. Plt now normal after spenectomy - **MD advise on starting oral anticoag** - now off IV Amiodarone. Coreg was held on admission secondary to hypotension - now restarted on Coreg 3.125mg  BID with BP tolerating this well.  - keep K+ ~ 4.0 and Mg > 2.0. - had brief episodes of sinus tachycardia overnight with HR peaking in the 110's.  - Will increase to Coreg to 6.25 mg bid as pt BP improving  2. Severe ITP s/p splenectomy - Pt being followed by Hematology - s/p splenectomy 07/13/16.  - platelets were < 5 on admission, now improved to 219.  3. Blood loss anemia - Hgb dropped from 11.2 to 7. Transfused 07/10/16 - now with Hgb slight trend down, 9.1 on 3/26.  4. Pleural effusions - Recurrent pleural effusions this admission - thoracentesis 07/12/16- partially successful-1.2L removed from Lt but then pt became hypotensive.  - repeat thoracentesis with 1600cc of serosanguinous fluid on left and 900cc on right s/p bilateral chest tubes. Chest tubes removed 03/25.   5. Acute on chronic diastolic CHF - echo this admission shows a preserved EF of 60-65% with LVH and grade 2 DD. - weight was 201 lbs on admission, at 183 lbs this AM. Recorded output of -5.6L this admission.  - however, I/O not accurate, pt drinking well and wt up 8 lbs in 3 days - Mild volume overloaded on physical examination, will give 1 dose IV Lasix 20 mg and follow  6. Hypokalemia - resolved. Keep ~ 4.0. - supp Mg  Otherwise, per IM  Signed, Rosaria Ferries , PA-C 8:45 AM 07/24/2016 Pager: [phone]  I have seen, examined and evaluated the patient this AM along with Ms. Ahmed Prima, PA-C.  After reviewing all the available data and chart, we discussed the patients laboratory, study &  physical findings as well as symptoms in detail. I agree with her findings, examination as well as impression recommendations as per our discussion.    Rhythm remains SR - NSR & S-tachy - no further SVT/Afib on monitor -- I suspect that the arrhythmia was related to his critical illness.  My warrant OP monitor to determine +/- true Dx of Afib.  Will ask EP to review EKGs, but for now,  esp. In light of his recent acute illness, I would opt for NOT STARTING DOAC.  Agree with increasing BB dose as BP tolerates.   For now, otherwise stable from Cardiac Standpoint. - Will follow along on Tele, but otherwise will sign off. Call with ?s.   Glenetta Hew, M.D., M.S. Interventional Cardiologist   Pager # [phone] Phone # [phone] [address] [place], [place] [address]

## 2016-07-24 NOTE — Progress Notes (Signed)
PT has Flutter at bedside- order placed on 4/51/46 is duplicate.

## 2016-07-24 NOTE — Progress Notes (Signed)
[facility] Attending Note  Presenting HPI:  76 y.o. w/ HTN, DM, COPD, BTX and diastolic dysfxn, severe thrombocytopenia and recurrent pleural effusions w/ exudative by analysis back in Feb. He was being followed by Heme for ITP (did not respond to IVIG, or rituxan). Was admitted once again on 3/10 w/ HR 150s, shortness of breath and increased LE edema. Also felt to be in refractory ITP. Ultimately consulted by cards for SVT and surgery as well as heme for splenectomy. Treated supportively including a left thoracentesis on 2/14 which was reported as bloody but no fluid analysis sent. He returns to the ICU s/p splenectomy on 3/15 on the vent. PCCM asked to assist w/ care.   Subjective:  No acute events overnight. Patient had his bilateral chest tubes removed yesterday. Patient reports continued intermittent nonproductive cough. Denies any chest pain, pressure, or pleurisy. Does have some dyspnea with exertion.  Review of Systems:  Denies subjective fever or chills. Denies any abdominal pain or nausea. Denies any headache or vision changes.  Temp:  [97.4 F (36.3 C)-99.7 F (37.6 C)] 98.5 F (36.9 C) (03/26 0400) Pulse Rate:  [81-105] 98 (03/26 0830) Resp:  [19-32] 29 (03/26 0800) BP: (99-126)/(36-71) 126/71 (03/26 0830) SpO2:  [90 %-98 %] 91 % (03/26 0800) Weight:  [183 lb 10.3 oz (83.3 kg)] 183 lb 10.3 oz (83.3 kg) (03/26 0600)  General:  Awake. No distress. Alert. Physical therapist at bedside. Integument:  Warm & dry. No rash on exposed skin. Bilateral chest tube dressings loosely in place and supplemental tape applied. HEENT:  No scleral icterus or injection. Pupils symmetric.  Pulmonary: Mild bilateral basilar crackles. Mildly increased work of breathing on nasal cannula oxygen after attempted ambulation.  Cardiovascular:  Regular rate. No JVD apprecaited with body positioning.  Abdomen:  Soft. Normal bowel sounds. Neurological:  Cranial nerves grossly in tact. No  meningismus. Moving all 4 extremities equally. Oriented x4.   CBC Latest Ref Rng & Units 07/24/2016 07/23/2016 07/22/2016  WBC 4.0 - 10.5 K/uL 13.7(H) 12.7(H) 13.8(H)  Hemoglobin 13.0 - 17.0 g/dL 9.1(L) 9.5(L) 9.9(L)  Hematocrit 39.0 - 52.0 % 29.8(L) 31.3(L) 33.0(L)  Platelets 150 - 400 K/uL 219 266 281    BMP Latest Ref Rng & Units 07/24/2016 07/23/2016 07/22/2016  Glucose 65 - 99 mg/dL 165(H) 193(H) 227(H)  BUN 6 - 20 mg/dL 33(H) 36(H) 39(H)  Creatinine 0.61 - 1.24 mg/dL 0.81 0.80 0.85  Sodium 135 - 145 mmol/L 140 142 144  Potassium 3.5 - 5.1 mmol/L 4.0 3.9 3.8  Chloride 101 - 111 mmol/L 103 104 105  CO2 22 - 32 mmol/L 32 33(H) 32  Calcium 8.9 - 10.3 mg/dL 8.5(L) 8.7(L) 8.7(L)    IMAGING/STUDIES: PORT CXR 3/26:  Personally reviewed by me. Persistent bilateral lower lobe small pleural effusions versus opacity secondary to pneumonia. No pneumothorax appreciated. Low lung volumes. Bilateral chest tubes removed.  MICROBIOLOGY: MRSA PCR 3/11:  Negative Right Pleural Fluid Culture 3/16:  Pseudomonas aeruginosa (pan-sensitive) Blood Cultures x2 3/16:  Negative  Urine Culture 3/17:  Negative    ANTIBIOTICS: Vancomycin 3/16 - 3/18 Zosyn 3/16 - 3/18 Fortaz 3/18 - 3/24 Cipro 3/24 >>>  LINES/TUBES: OETT 3/15 - 3/16 (self extubated); 3/16 - 3/22 Bilateral Chest tubes 3/16 - 3/25 RUE DL PICC 3/17>>>  SIGNIFICANT EVENTS: 03/10 - Admit 03/15 - Splenectomy 03/16 - Right thoracentesis:  800cc appearing as old blood 03/20 - Started TPA/DNase 03/21 - Lasix stopped due to metabolic alkalosis & Amiodarone stopped 03/22 -  Finished TPA/DNase  ASSESSMENT/PLAN:  76 y.o. male with ITP status post splenectomy. Patient does have bilateral complicated pleural effusions that are likely parapneumonic from Pseudomonas pneumonia. With patient's history of bronchiectasis this is most certainly the source. He did undergo intrapleural lytic therapy via chest tube. This was completed and chest tubes were  removed on 3/25. His chest x-ray still shows mild bilateral basilar opacities. He does have a mild leukocytosis which is of unclear significance given his clinical improvement. He is currently completing a course of antibiotics for his Pseudomonas pneumonia and complicated pleural effusions.  1. Bilateral Complicated Pleural Effusions:  Status post bilateral chest tubes that were removed on 3/25. Patient underwent intra-pleural TPA/DNase for 3 days per MIST 2 Protocol. Recommend repeat CT chest without contrast in 2-3 days to reevaluate for reaccumulation of pleural effusion. 2. Bilateral Pseudomonas Pneumonia:  Patient completing a course of antibiotics with Ciprofloxacin ending on 3/30. Patient needs repeat chest x-ray imaging in 4-6 weeks to ensure resolution of bilateral basilar opacities. 3. Acute Hypoxic Respiratory Failure:  Secondary to #1 & #2 above. Continuing pulmonary toilette and PT. Patient to get out of bed. Weaning FiO2 as tolerated.  4. Bronchiectasis:  Has a know history. Continuing Atrovent & Xopenex nebulized tid. Continuing Incentive Spirometry and Flutter valve q4hr while awake.   Further management per primary service and other consultants. PCCM will sign off at this time. Please let me know if we can be of any further assistance in the care of this patient.   I have spent a total of 36 minutes of time today caring for the patient, reviewing the patient's electronic medical record, and with more than 50% of that time spent coordinating care with the patient as well as reviewing the continuing plan of care with the patient/ at bedside.  Sonia Baller Ashok Cordia, M.D. [facility] Pager:  [phone] After 3pm or if no response, call [phone] 8:54 AM 07/24/16

## 2016-07-25 ENCOUNTER — Inpatient Hospital Stay (HOSPITAL_COMMUNITY): Payer: Medicare HMO

## 2016-07-25 LAB — BASIC METABOLIC PANEL
Anion gap: 4 — ABNORMAL LOW (ref 5–15)
BUN: 29 mg/dL — ABNORMAL HIGH (ref 6–20)
CHLORIDE: 97 mmol/L — AB (ref 101–111)
CO2: 35 mmol/L — ABNORMAL HIGH (ref 22–32)
Calcium: 8.6 mg/dL — ABNORMAL LOW (ref 8.9–10.3)
Creatinine, Ser: 0.69 mg/dL (ref 0.61–1.24)
GFR calc non Af Amer: >60 mL/min (ref >60)
Glucose, Bld: 199 mg/dL — ABNORMAL HIGH (ref 65–99)
POTASSIUM: 4.4 mmol/L (ref 3.5–5.1)
SODIUM: 136 mmol/L (ref 135–145)

## 2016-07-25 LAB — CBC
HCT: 28.7 % — ABNORMAL LOW (ref 39.0–52.0)
HEMOGLOBIN: 8.7 g/dL — AB (ref 13.0–17.0)
MCH: 26.6 pg (ref 26.0–34.0)
MCHC: 30.3 g/dL (ref 30.0–36.0)
MCV: 87.8 fL (ref 78.0–100.0)
Platelets: 171 10*3/uL (ref 150–400)
RBC: 3.27 MIL/uL — AB (ref 4.22–5.81)
RDW: 15.8 % — ABNORMAL HIGH (ref 11.5–15.5)
WBC: 15.2 10*3/uL — ABNORMAL HIGH (ref 4.0–10.5)

## 2016-07-25 LAB — GLUCOSE, CAPILLARY
Glucose-Capillary: 186 mg/dL — ABNORMAL HIGH (ref 65–99)
Glucose-Capillary: 165 mg/dL — ABNORMAL HIGH (ref 65–99)
Glucose-Capillary: 348 mg/dL — ABNORMAL HIGH (ref 65–99)
Glucose-Capillary: 154 mg/dL — ABNORMAL HIGH (ref 65–99)

## 2016-07-25 MED ORDER — SENNOSIDES-DOCUSATE SODIUM 8.6-50 MG PO TABS
1.0000 | ORAL_TABLET | Freq: Two times a day (BID) | ORAL | Status: DC
  Administered 2016-07-26 – 2016-08-02 (×12): 1 via ORAL
  Filled 2016-07-25 (×14): qty 1

## 2016-07-25 MED ORDER — FUROSEMIDE 10 MG/ML IJ SOLN
40.0000 mg | Freq: Every day | INTRAMUSCULAR | Status: DC
  Administered 2016-07-25 – 2016-07-26 (×2): 40 mg via INTRAVENOUS
  Filled 2016-07-25 (×2): qty 4

## 2016-07-25 MED ORDER — QUETIAPINE FUMARATE 25 MG PO TABS
25.0000 mg | ORAL_TABLET | Freq: Every day | ORAL | Status: DC
  Administered 2016-07-25 – 2016-07-28 (×3): 25 mg
  Filled 2016-07-25 (×3): qty 1

## 2016-07-25 MED ORDER — SODIUM CHLORIDE 3 % IN NEBU
4.0000 mL | INHALATION_SOLUTION | Freq: Two times a day (BID) | RESPIRATORY_TRACT | Status: AC
  Administered 2016-07-25 – 2016-07-27 (×5): 4 mL via RESPIRATORY_TRACT
  Filled 2016-07-25: qty 4
  Filled 2016-07-25 (×3): qty 15
  Filled 2016-07-25: qty 4
  Filled 2016-07-25: qty 15

## 2016-07-25 MED ORDER — PNEUMOCOCCAL VAC POLYVALENT 25 MCG/0.5ML IJ INJ
0.5000 mL | INJECTION | INTRAMUSCULAR | Status: AC
  Administered 2016-07-27: 0.5 mL via INTRAMUSCULAR
  Filled 2016-07-25: qty 0.5

## 2016-07-25 NOTE — Progress Notes (Signed)
CSW met with patient, spouse and son at bedside, explain role and reason for consult. A full CSW assessment was completed on 2/17, please refer to assessment for additional question. CSW discussed d/c planning with family. Patient spouse feels patient is too deconditioned and weak to get out chair. She is requesting PT work with patient more while at hospital. CSW explain PT has scheduled consults and times they meet with patient, not usually daily. Patient reports not understanding, CSW informed RN to update family about next PT visit. CSW is assisting with patient disposition to SNF, Patient has [facility] and will need prior authorization before going to SNF. CSW is to provide bed offers to family.  Will continue to assist with disposition  Kathrin Greathouse, Latanya Presser, MSW Clinical Social Worker [address] and [place] [phone] 07/25/2016  12:59 PM

## 2016-07-25 NOTE — Care Management Important Message (Signed)
Important Message  Patient Details  Name: [name] MRN: [id number] Date of Birth: 08/14/1940   Medicare Important Message Given:  Yes    Kerin Salen 07/25/2016, 10:34 AMImportant Message  Patient Details  Name: [name] MRN: [id number] Date of Birth: [birth year]   Medicare Important Message Given:  Yes    Kerin Salen 07/25/2016, 10:34 AM

## 2016-07-25 NOTE — Progress Notes (Signed)
RT went to do chest PT on patient. Patient request to skip chest PT via vest and do flutter instead. Patient tolerated well.

## 2016-07-25 NOTE — NC FL2 (Signed)
[place] LEVEL OF CARE SCREENING TOOL     IDENTIFICATION  Patient Name: [name] Birthdate: 1940-05-15 Sex: male Admission Date (Current Location): 07/08/2016  [facility] and [place] Number:  Herbalist and Address:  [facility],  [place] [address] [place]      Provider Number: [id number]  Attending Physician Name and Address:  Florencia Reasons, MD  Relative Name and Phone Number:       Current Level of Care: Hospital Recommended Level of Care: [place] Prior Approval Number:    Date Approved/Denied:   PASRR Number: [id number]  Discharge Plan: SNF    Current Diagnoses: Patient Active Problem List   Diagnosis Date Noted  . Pleural effusion, bilateral   . Arterial hypotension   . Pneumonia   . Septic shock ([place]) 07/18/2016  . Acute encephalopathy 07/18/2016  . Empyema ([place]) 07/17/2016  . Ventilator dependence (HCC) d/t Empyema, and volume overload. Complicated further by baseline COPD and Bronchiectasis  07/17/2016  . Disorders of fluid, electrolyte, and acid-base balance 07/17/2016  . Anemia in acute on chronic illness 07/17/2016  . Severe protein-calorie malnutrition ([place]) 07/17/2016  . Anasarca 07/17/2016  . Severe muscle deconditioning 07/17/2016  . Acute on chronic heart failure ([place])   . Shock circulatory ([place])   . S/P thoracentesis   . 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 Atrial fibrillation with RVR ([place])   . Chronic diastolic CHF (congestive heart failure) ([place]) 07/09/2016  . Diabetes mellitus without complication ([place]) 82/42/3536  . HLD (hyperlipidemia) 07/09/2016  . GERD (gastroesophageal reflux disease) 07/09/2016  . Acute on chronic diastolic CHF (congestive heart failure) ([place]) 07/09/2016  . Acute respiratory failure ([place]) 07/09/2016  . SVT (supraventricular tachycardia) ([place]) 07/09/2016  . Bronchiectasis with acute exacerbation ([place])   . ITP  secondary to infection   . Paroxysmal SVT (supraventricular tachycardia) ([place])   . Pleural effusion, right 06/10/2016  . Obstructive bronchiectasis ([place]) 05/23/2016  . Acute ITP ([place]) 05/15/2016  . Upper airway cough syndrome 02/17/2016  . Shortness of breath 02/17/2016  . Cough while on acei and with active sinusitis 02/17/2012  . COPD  GOLD III  02/17/2012  . HBP (high blood pressure) 02/17/2012    Orientation RESPIRATION BLADDER Height & Weight     Self, Time, Situation, Place   (3L) Continent Weight: 183 lb 6.4 oz (83.2 kg) Height:  5\' 11"  (180.3 cm)  BEHAVIORAL SYMPTOMS/MOOD NEUROLOGICAL BOWEL NUTRITION STATUS      Incontinent  (Carb Modified )  AMBULATORY STATUS COMMUNICATION OF NEEDS Skin   Limited Assist Verbally  (Skin Tear-Buttocks, Incisison Abdomen  )                       Personal Care Assistance Level of Assistance  Dressing, Bathing, Feeding Bathing Assistance: Limited assistance Feeding assistance: Independent Dressing Assistance: Limited assistance     Functional Limitations Info  Sight, Hearing, Speech Sight Info: Adequate Hearing Info: Adequate Speech Info: Adequate    SPECIAL CARE FACTORS FREQUENCY  PT (By licensed PT)     PT Frequency: 5              Contractures Contractures Info: Not present    Additional Factors Info  Code Status, Allergies, Insulin Sliding Scale Code Status Info: Full Code Allergies Info: NKA Insulin Sliding Scale:insulin aspart (novoLOG) injection 0-20 Units         Current Medications (07/25/2016):  This is  the current hospital active medication list Current Facility-Administered Medications  Medication Dose Route Frequency Provider Last Rate Last Dose  . acetaminophen (TYLENOL) tablet 650 mg  650 mg Oral Q4H PRN Ivor Costa, MD      . atorvastatin (LIPITOR) tablet 40 mg  40 mg Per Tube q1800 Erick Colace, NP   40 mg at 07/24/16 1802  . bisacodyl (DULCOLAX) suppository 10 mg  10 mg Rectal Daily PRN Stark Klein, MD      . budesonide (PULMICORT) nebulizer solution 0.25 mg  0.25 mg Nebulization BID Debbe Odea, MD   0.25 mg at 07/23/16 2038  . carvedilol (COREG) tablet 3.125 mg  3.125 mg Oral Once Minda Ditto, [place] at 07/24/16 1002  . carvedilol (COREG) tablet 6.25 mg  6.25 mg Oral BID WC Rhonda G Barrett, PA-C   6.25 mg at 07/25/16 0852  . Chlorhexidine Gluconate Cloth 2 % PADS 6 each  6 each Topical Daily Rush Farmer, MD   6 each at 07/23/16 1000  . ciprofloxacin (CIPRO) tablet 500 mg  500 mg Oral BID Donald Prose Runyon, RPH   500 mg at 07/25/16 6295  . fentaNYL (SUBLIMAZE) injection 50 mcg  50 mcg Intravenous Q2H PRN Erick Colace, NP      . guaiFENesin [facility]) 12 hr tablet 600 mg  600 mg Oral BID Florencia Reasons, MD   600 mg at 07/25/16 2841  . [START ON 07/27/2016] haemophilus B polysaccharide conjugate vaccine (ActHIB) injection 0.5 mL  0.5 mL Intramuscular Once Stark Klein, MD      . heparin injection 5,000 Units  5,000 Units Subcutaneous Q8H Erick Colace, NP   5,000 Units at 07/25/16 0525  . insulin aspart (novoLOG) injection 0-20 Units  0-20 Units Subcutaneous TID WC Marshell Garfinkel, MD   4 Units at 07/25/16 0832  . insulin glargine (LANTUS) injection 8 Units  8 Units Subcutaneous Daily Marshell Garfinkel, MD   8 Units at 07/25/16 0853  . ipratropium (ATROVENT) nebulizer solution 0.5 mg  0.5 mg Nebulization TID Florencia Reasons, MD   0.5 mg at 07/24/16 1344  . lactose free nutrition (BOOST PLUS) liquid 237 mL  237 mL Oral TID BM Erick Colace, NP   237 mL at 07/25/16 0853  . levalbuterol (XOPENEX) nebulizer solution 0.63 mg  0.63 mg Nebulization Q3H PRN Bertram Savin Sheikh, DO      . levalbuterol (XOPENEX) nebulizer solution 1.25 mg  1.25 mg Nebulization TID Florencia Reasons, MD   1.25 mg at 07/24/16 1344  . loratadine (CLARITIN) tablet 10 mg  10 mg Oral Daily Erick Colace, NP   10 mg at 07/25/16 3244  . magnesium oxide (MAG-OX) tablet 200 mg  200 mg Oral Daily Evelene Croon Barrett, PA-C   200 mg at  07/25/16 0852  . MEDLINE mouth rinse  15 mL Mouth Rinse BID Rush Farmer, MD   15 mL at 07/25/16 0853  . [START ON 07/27/2016] meningococcal oligosaccharide (MENVEO) injection 0.5 mL  0.5 mL Intramuscular Once Stark Klein, MD      . metoprolol (LOPRESSOR) injection 2.5 mg  2.5 mg Intravenous Q5 min PRN Jettie Booze, MD   2.5 mg at 07/22/16 1305  . multivitamin with minerals tablet 1 tablet  1 tablet Oral Daily Marshell Garfinkel, MD   1 tablet at 07/25/16 0852  . [START ON 07/27/2016] pneumococcal 23 valent vaccine (PNU-IMMUNE) injection 0.5 mL  0.5 mL Intramuscular Tomorrow-1000 Florencia Reasons, MD      .  polyethylene glycol (MIRALAX / GLYCOLAX) packet 17 g  17 g Oral Daily Florencia Reasons, MD   17 g at 07/25/16 0851  . QUEtiapine (SEROQUEL) tablet 25 mg  25 mg Per Tube QHS Florencia Reasons, MD      . senna-docusate (Senokot-S) tablet 1 tablet  1 tablet Oral QHS Florencia Reasons, MD   1 tablet at 07/24/16 2224  . sodium chloride flush (NS) 0.9 % injection 10-40 mL  10-40 mL Intracatheter Q12H Rush Farmer, MD   10 mL at 07/24/16 1000  . sodium chloride flush (NS) 0.9 % injection 10-40 mL  10-40 mL Intracatheter PRN Rush Farmer, MD   10 mL at 07/25/16 0434     Discharge Medications: Please see discharge summary for a list of discharge medications.  Relevant Imaging Results:  Relevant Lab Results:   Additional Information SSN  [id number]     Uses inhaler at home.    Lia Hopping, LCSW

## 2016-07-25 NOTE — Progress Notes (Signed)
12 Days Post-Op  Subjective: Pt continues to feel better.     Objective: Vital signs in last 24 hours: Temp:  [98.1 F (36.7 C)-98.5 F (36.9 C)] 98.1 F (36.7 C) (03/27 1248) Pulse Rate:  [75-79] 79 (03/27 1248) Resp:  [14-22] 14 (03/27 1248) BP: (100-103)/(50-76) 103/50 (03/27 1248) SpO2:  [96 %-98 %] 97 % (03/27 1454) Weight:  [83.2 kg (183 lb 6.4 oz)] 83.2 kg (183 lb 6.4 oz) (03/27 0446) Last BM Date: 07/24/16  Intake/Output from previous day: 03/26 0701 - 03/27 0700 In: 1132 [P.O.:1102; I.V.:30] Out: 825 [Urine:825] Intake/Output this shift: Total I/O In: 480 [P.O.:480] Out: 300 [Urine:300]  General appearance: alert, cooperative and no distress Abdomen:  Soft, non distended.    Lab Results:   Recent Labs  07/24/16 0355 07/25/16 0428  WBC 13.7* 15.2*  HGB 9.1* 8.7*  HCT 29.8* 28.7*  PLT 219 171    BMET  Recent Labs  07/24/16 0355 07/25/16 0428  NA 140 136  K 4.0 4.4  CL 103 97*  CO2 32 35*  GLUCOSE 165* 199*  BUN 33* 29*  CREATININE 0.81 0.69  CALCIUM 8.5* 8.6*   PT/INR No results for input(s): LABPROT, INR in the last 72 hours.   Recent Labs Lab 07/22/16 0442  AST 13*  ALT 11*  ALKPHOS 55  BILITOT 0.9  PROT 5.3*  ALBUMIN 2.6*     Lipase  No results found for: LIPASE   Studies/Results: Ct Chest Wo Contrast  Result Date: 07/25/2016 CLINICAL DATA:  Follow-up pleural effusion EXAM: CT CHEST WITHOUT CONTRAST TECHNIQUE: Multidetector CT imaging of the chest was performed following the standard protocol without IV contrast. COMPARISON:  07/17/2016 FINDINGS: Cardiovascular: Somewhat limited due to the lack of IV contrast. Diffuse aortic calcifications are noted without aneurysmal dilatation. Coronary calcifications are seen. No significant cardiac enlargement is noted. Mediastinum/Nodes: The thoracic inlet is within normal limits. Right peritracheal lymph node is noted measuring less than 10 mm in short axis. Scattered small mediastinal and  hilar lymph nodes are seen likely reactive in nature. Lungs/Pleura: Bilateral pleural effusions are noted right greater than left with air pockets similar to that seen on the prior exam. The previously seen chest tubes have been removed in the interval. Bilateral lower lobe consolidation is noted considerably greater on the right than the left. These have increased in the interval from the prior exam. Patchy changes are noted in the right upper lobe. Upper Abdomen: Stable from the previous exam. Musculoskeletal: Degenerative change of thoracic spine is noted. IMPRESSION: Stable appearing bilateral pleural effusions with air pockets within. There is been interval removal of chest tubes bilaterally. Bilateral infiltrates right greater than left increasing particularly in the right lower lobe. Electronically Signed   By: Inez Catalina M.D.   On: 07/25/2016 11:57   Dg Chest Port 1 View  Result Date: 07/24/2016 CLINICAL DATA:  Respiratory failure. EXAM: PORTABLE CHEST 1 VIEW COMPARISON:  07/23/2016. FINDINGS: Interim removal of bilateral chest 2. Right PICC line stable position. Cardiomegaly again noted. Persistent but improved bilateral pulmonary infiltrates. Persistent basilar atelectasis . Persistent bilateral pleural effusions, right side greater than left. Bilateral pleural effusions are again noted . IMPRESSION: 1. Interim removal of bilateral chest tubes. No pneumothorax. Right PICC line stable position. 2. Persistent bilateral pulmonary infiltrates and bilateral pleural effusions, improved slightly from prior exam. Persistent bibasilar atelectasis . Electronically Signed   By: Marcello Moores  Register   On: 07/24/2016 07:06    Medications: . atorvastatin  40 mg  Per Tube q1800  . budesonide (PULMICORT) nebulizer solution  0.25 mg Nebulization BID  . carvedilol  3.125 mg Oral Once  . carvedilol  6.25 mg Oral BID WC  . Chlorhexidine Gluconate Cloth  6 each Topical Daily  . ciprofloxacin  500 mg Oral BID  .  furosemide  40 mg Intravenous Daily  . guaiFENesin  600 mg Oral BID  . [START ON 07/27/2016] haemophilus B polysaccharide conjugate vaccine  0.5 mL Intramuscular Once  . heparin subcutaneous  5,000 Units Subcutaneous Q8H  . insulin aspart  0-20 Units Subcutaneous TID WC  . insulin glargine  8 Units Subcutaneous Daily  . ipratropium  0.5 mg Nebulization TID  . lactose free nutrition  237 mL Oral TID BM  . levalbuterol  1.25 mg Nebulization TID  . loratadine  10 mg Oral Daily  . magnesium oxide  200 mg Oral Daily  . mouth rinse  15 mL Mouth Rinse BID  . [START ON 07/27/2016] meningococcal oligosaccharide  0.5 mL Intramuscular Once  . multivitamin with minerals  1 tablet Oral Daily  . [START ON 07/27/2016] pneumococcal 23 valent vaccine  0.5 mL Intramuscular Tomorrow-1000  . polyethylene glycol  17 g Oral Daily  . QUEtiapine  25 mg Per Tube QHS  . senna-docusate  1 tablet Oral BID  . sodium chloride flush  10-40 mL Intracatheter Q12H  . sodium chloride HYPERTONIC  4 mL Nebulization Q12H   NO IV fluids Urine culture:  No growth Specimen Description PLEURAL   07/14/16  Special Requests NONE   Gram Stain NO WBC SEEN  RARE GRAM NEGATIVE RODS  Performed at [facility], [place] [address] [place], [place] [address]      Culture ABUNDANT PSEUDOMONAS AERUGINOSA    Specimen Description LEFT ANTECUBITAL   Blood cultures 07/14/16  Special Requests BOTTLES DRAWN AEROBIC AND ANAEROBIC 3CC   Culture NO GROWTH 5 DAYS        Assessment/Plan 1.  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 Barry Dienes,   POD 11   2.  Atrial fibrillation with RVR  - normal EF with grade 2 diastolic dysfunction, calcified mitral annulus with trivial MR. Trivial TR 3.  R>L pleural effusions/status post ultrasound-guided left thoracentesis 06/14/16;1.5 liters Pathology: REACTIVE MESOTHELIAL CELLS PRESENT, MIXED LYMPHOID POPULATION. Post op empyema/pneumonia  - Bilateral  CT placement - hydropneumothorax 07/17/16 4. Post op delerium 5.  COPD/History tobacco use - 42-pack-year history 6.  Hypertension 7.  Hyperlipidemia 8.  Type 2 diabetes mellitus. 9.  Bilateral lower extremity edema/fluid overload - pre op & post op FEN:  Carb Mod diet ID:  Day 11 abx, Currently on Cipro day 3 DVT:  Heparin  Plan:  Doing well from surgical standpoint.  Home when stable for d/c.  Appears that PT is recommending short term SNF.   Primary issues is patient's severe deconditioning from his ITP and pleural effusions, numerous transfusions, etc.   Post splenectomy vaccines Thursday OR day of discharge if that is earlier.     LOS: 16 days    Ernie Kasler 07/25/2016

## 2016-07-25 NOTE — Progress Notes (Signed)
PROGRESS NOTE    [name]  JOI:[id number] DOB: 08-18-1940 DOA: 07/08/2016 PCP: [name], MD  Brief Narrative:  [name] is a 76 y.o. male with medical history significant of hypertension, hyperlipidemia, diabetes mellitus (possible steroid induced), COPD  (home o2 3liters started in 06/2016), bronchiectasis, GERD, idiopathic thrombocytopenia, dCHF, bilateral pleural effusion,PSVT who presents with shortness of breath and bilateral leg edema.  He was recently hospitalized from 2/10 to 2/20 for pleural effusion( had thoracentesis and diuresed), SVT, ITP, he Returns to the ED on 3/11 for HR >150 noted while he was checking Vitals at home. ER performed electrical cardioverstion due to concomitant complaint of dyspnea and hypotension, he was started on Cardizem and amiodarone infusion and admitted to SDU under hospitalist service.   Has severe refractory ITP so Oncology consulted and recommended Splenectomy so General Surgery consulted patient to undergo splenectomy on 3/15. Patient is s/p Left Thoracentesis 3/14, culture+ pseudomonas. He is transferred to ICU intubated post op. He self extubated on 3/15, had to be reintubated due to respiratory failure. extubated on 3/22  He has bilatearal chest tube placed on 3/16, he received TPA/DNAse due to loculated pleural effusion, chest tube removed on 3/25. He is transferred to hospitalist service on 3/25  repeat ct chest  On 3/27 with continued consolidation, consider broaden abx if wbc continue to trend up or if developing fever. Continue lasix if bp and cr allows, nutrition supplement as well. I have requested pccm to continue follow the patient , please see below. Hopefull able to d/c to SNF in 1-2 days if pulmonology clears patient.   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 Complicated further by baseline COPD and Bronchiectasis    Disorders of fluid, electrolyte, and acid-base balance   Anemia in acute on chronic illness   Severe protein-calorie malnutrition (HCC)   Anasarca   Severe muscle deconditioning   Septic shock (HCC)   Acute encephalopathy   Arterial hypotension   Pneumonia   Pleural effusion, bilateral  SVT  At home and in ER (presenting symptom) Paroxysmal A-fib with RVR subsequently -  s/p electrical cardioversion in ER following by Cardizem (stopped on 3/17)/amiodarone infusion (stopped on 3/21), echo with lvef 60-65%, grade II diastolic dysfunction - now on coreg, CHADSVASC score 4( age, htn,dm2, diastolic chf), per cardiology Dr Ellyn Hack " suspect that the arrhythmia was related to his critical illness.  My warrant OP monitor to determine +/- true Dx of Afib.  Will ask EP to review EKGs, but for now, esp. In light of his recent acute illness, I would opt for NOT STARTING DOAC.""increasing BB dose as BP tolerates. " - cardiology signed off, he needs outpatient cardiology follow up.   ITP  - failed multiple line of medical therapy,  plt count 5 on admission, nose bleed (was on around clock amicar), blood loss anemia - s/p Transfusions of 4 pools of Platelets s/p 4prbc transfusion, s/p Splenectomy on 3/15 -plt has been normalized sicne 3/22 -surgery continue to follow daily, oncology available as needed,  post splenectomy vaccinations per general surgery.   Hypoxic respiratory failure/septic shock Self extubated post op on 3/15, but reintubated due to hypoxic respiratory  failure and septic shock/pna/empyema Improving, extubated on 3/22, on abx for pna/empyema, blood culture no growth, pleural fluids from 3/16 +PSEUDOMONAS  AERUGINOSA Currently on 2-3liter o2supplement at rest, his o2 sats dropped to 86% briefly with increased activity on 3/26 -continue wean o2 as tolerated  B/l pleural effusions, hydropneumothorax , bilateral pneumonia with pseudomonas empyema, dCHF- chronic, 2+ pedal edema, anasarca - -S/p Left Thoracentesis done by IR  3/14 after platelet infusions , -s/p bilateral chest tube placed on 3/16 with TPA/DNAse ( not a candidate for VATs per pulmonology), cytology negative,  culture + psuedomonas, abx 11/14 as of 3/27, chest tubes removed on 3/25,  -patient continued to have congested cough, oxygen requirement, wbc seems trending up, Chest CT on 3/27 "Bilateral infiltrates right greater than left increasing particularly in the right lower lobe. Stable appearing bilateral pleural effusions with air pockets within." - Case discussed with thoracic surgery Dr Roxy Manns over the phone today on 3/27, Dr Roxy Manns reviewed the chest CT who recommend continue aggressive pulmonary PT, abx, may consider bronchoscopy, nothing surgical currently, but if infection does not improve, he is at risk of developing empyema in the future in this immunocompromised patient" - pulmonology signed off on 3/26, I have talked to Pulmonology Dr Ashok Cordia who graciously agreed to continue to follow patient , they will see patient on 3/28. -consider broaden abx if wbc continue to trend up or if developing fever. Continue lasix if bp and cr allows, nutrition supplement as well.   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  -he has an follow up appointment with Dr Melvyn Novas on 4/23.   ICU delirium:  required precedex and seroquel Then seroquel along 50mg  qhs , improving, no hallucination, no confusion the last few days, decrease seroquel to 25mg  qhs with a plan to d/c in 1-2 days if no confusion.    insulin  dependent Diabetes mellitus( prednisone induced? He was on prednisone for ITP) - A1c 6.6 - off steroid,  -continue adjust lantus/ssi  HLD continue statin  Abnormal thyroid functions tsh 5, free t4 1.39 - recommend these be rechecked in 3-4 wks  Deconditioning: PT/SNF  DVT prophylaxis: SCDs then heparin Code Status: FULL CODE Family Communication: wife and son at bedside on 3/27 Disposition Plan:  hopfully SNFin 48hrs  if pulmonology does not plan to do further procedure to lung consolidation If patient clinically continue to improve, monitor wbc  general surgery/cardiology oked discharge  Consultants:   Cardiology (signed off on 3/26)  Oncology ( Oncology Dr Gudena/Dr Magrinat/Lindsey Holley Dexter,  NP follow peripherally now (if any hematology issues arise [phone].)  General Surgery (continue to follow daily,but oked with discharge)  Interventional Radiology  Pulmonology /critical care ( signed off on 3/26, I have requested continued support from PCCM, pccm will see patient on 3/28 per Dr Ashok Cordia)   Procedures:   Left Thoracentesis by IR on 3/14  s/p Transfusions of 4 pools of Platelets   s/p 4prbc transfusion  s/p Splenectomy on 3/15  intubation on 3/15, self extubation on 3/15, reintubation on 3/15, extubation on 3/22  Bilateral chest tube placement on 3/16, removed on 3/25  R picc line placement on 3/17  Aline discontinued on 3/23   Antimicrobials:  Anti-infectives    Start     Dose/Rate Route Frequency Ordered Stop   07/22/16 1400  ciprofloxacin (CIPRO) tablet 500 mg  500 mg Oral 2 times daily 07/22/16 1223 07/28/16 2359   07/16/16 1200  cefTAZidime (FORTAZ) 2 g in dextrose 5 % 50 mL IVPB  Status:  Discontinued     2 g 100 mL/hr over 30 Minutes Intravenous Every 8 hours 07/16/16 0942 07/22/16 1207   07/15/16 0600  vancomycin (VANCOCIN) 1,250 mg in sodium chloride 0.9 % 250 mL IVPB  Status:  Discontinued     1,250 mg 166.7 mL/hr over  90 Minutes Intravenous Every 12 hours 07/14/16 1901 07/16/16 0942   07/15/16 0200  piperacillin-tazobactam (ZOSYN) IVPB 3.375 g  Status:  Discontinued     3.375 g 12.5 mL/hr over 240 Minutes Intravenous Every 8 hours 07/14/16 1901 07/16/16 0942   07/14/16 1930  vancomycin (VANCOCIN) 1,500 mg in sodium chloride 0.9 % 500 mL IVPB     1,500 mg 250 mL/hr over 120 Minutes Intravenous  Once 07/14/16 1901 07/14/16 2251   07/14/16 1930  piperacillin-tazobactam (ZOSYN) IVPB 3.375 g     3.375 g 100 mL/hr over 30 Minutes Intravenous  Once 07/14/16 1901 07/14/16 2058     Subjective:  Sitting up in chair, on 2-3 liter, still with congested cough, denies chest pain, no fever,  Some lower extremity edema Wife and son at bedside   Objective: Vitals:   07/24/16 2159 07/25/16 0446 07/25/16 1248 07/25/16 1454  BP: 100/65 101/76 (!) 103/50   Pulse: 76 75 79   Resp: (!) 22 20 14    Temp: 98.5 F (36.9 C) 98.3 F (36.8 C) 98.1 F (36.7 C)   TempSrc: Oral Oral Oral   SpO2:  98% 96% 97%  Weight:  83.2 kg (183 lb 6.4 oz)    Height:        Intake/Output Summary (Last 24 hours) at 07/25/16 1828 Last data filed at 07/25/16 1700  Gross per 24 hour  Intake              740 ml  Output              975 ml  Net             -235 ml   Filed Weights   07/23/16 0500 07/24/16 0600 07/25/16 0446  Weight: 79.9 kg (176 lb 2.4 oz) 83.3 kg (183 lb 10.3 oz) 83.2 kg (183 lb 6.4 oz)   Examination: Physical Exam:  Constitutional: frail, NAD Respiratory: Diminished at basis bilaterally, no wheezing, bilateral chest tubes has removed on 3/25.  Cardiovascular: RRR, .1+Lower Extremity Edema Abdomen: Soft, non-tender, non-distended. +bs, post op changes.  Musculoskeletal: No clubbing / cyanosis of digits/nails. + pitting edema Skin: No rashes, lesions, ulcers on limited skin evaluation. No induration; Warm and dry.  Neurologic: previously documented confusion has resolved, aaox3 Psychiatric: Normal judgment and  insight.  Normal mood and appropriate affect.   Data Reviewed: I have personally reviewed following labs and imaging studies  CBC:  Recent Labs Lab 07/19/16 0522 07/20/16 0437 07/22/16 0442 07/23/16 0500 07/24/16 0355 07/25/16 0428  WBC 11.8* 13.4* 13.8* 12.7* 13.7* 15.2*  NEUTROABS 10.2* 11.7*  --   --  10.9*  --   HGB 9.3* 9.1* 9.9* 9.5* 9.1* 8.7*  HCT 29.6* 29.2* 33.0* 31.3* 29.8* 28.7*  MCV 87.1 89.0 90.2 89.4 89.0 87.8  PLT 143* 187 281 266 219 893   Basic Metabolic Panel:  Recent Labs Lab 07/21/16 0511 07/22/16 0442 07/23/16 0500 07/24/16 0355 07/25/16 0428  NA 147* 144 142 140 136  K 3.9 3.8 3.9 4.0 4.4  CL 110 105 104 103 97*  CO2 32 32 33* 32 35*  GLUCOSE 272* 227* 193* 165* 199*  BUN 40* 39* 36* 33* 29*  CREATININE 0.90 0.85 0.80 0.81 0.69  CALCIUM 8.8* 8.7* 8.7* 8.5* 8.6*  MG  --   --  1.7 1.7  --   PHOS  --   --  2.5  --   --    GFR: Estimated Creatinine Clearance: 86.3 mL/min (by C-G formula based on SCr of 0.69 mg/dL). Liver Function Tests:  Recent Labs Lab 07/22/16 0442  AST 13*  ALT 11*  ALKPHOS 55  BILITOT 0.9  PROT 5.3*  ALBUMIN 2.6*   No results for input(s): LIPASE, AMYLASE in the last 168 hours. No results for input(s): AMMONIA in the last 168 hours. Coagulation Profile: No results for input(s): INR, PROTIME in the last 168 hours. Cardiac Enzymes: No results for input(s): CKTOTAL, CKMB, CKMBINDEX, TROPONINI in the last 168 hours. BNP (last 3 results)  Recent Labs  02/17/16 0948 05/05/16 1450  PROBNP 40.0 17.0   HbA1C: No results for input(s): HGBA1C in the last 72 hours. CBG:  Recent Labs Lab 07/24/16 1747 07/24/16 2100 07/25/16 0750 07/25/16 1157 07/25/16 1707  GLUCAP 201* 216* 186* 165* 348*   Lipid Profile: No results for input(s): CHOL, HDL, LDLCALC, TRIG, CHOLHDL, LDLDIRECT in the last 72 hours. Thyroid Function Tests: No results for input(s): TSH, T4TOTAL, FREET4, T3FREE, THYROIDAB in the last 72  hours. Anemia Panel: No results for input(s): VITAMINB12, FOLATE, FERRITIN, TIBC, IRON, RETICCTPCT in the last 72 hours. Sepsis Labs:  Recent Labs Lab 07/21/16 1805 07/22/16 0442 07/23/16 0500  PROCALCITON 0.21 0.28 0.18    No results found for this or any previous visit (from the past 240 hour(s)).  Radiology Studies: Ct Chest Wo Contrast  Result Date: 07/25/2016 CLINICAL DATA:  Follow-up pleural effusion EXAM: CT CHEST WITHOUT CONTRAST TECHNIQUE: Multidetector CT imaging of the chest was performed following the standard protocol without IV contrast. COMPARISON:  07/17/2016 FINDINGS: Cardiovascular: Somewhat limited due to the lack of IV contrast. Diffuse aortic calcifications are noted without aneurysmal dilatation. Coronary calcifications are seen. No significant cardiac enlargement is noted. Mediastinum/Nodes: The thoracic inlet is within normal limits. Right peritracheal lymph node is noted measuring less than 10 mm in short axis. Scattered small mediastinal and hilar lymph nodes are seen likely reactive in nature. Lungs/Pleura: Bilateral pleural effusions are noted right greater than left with air pockets similar to that seen on the prior exam. The previously seen chest tubes have been removed in the interval. Bilateral lower lobe consolidation is noted considerably greater on the right than the left. These have increased in the interval from the prior exam. Patchy changes are noted in the right upper lobe. Upper Abdomen: Stable from the previous exam. Musculoskeletal: Degenerative change of thoracic spine is noted. IMPRESSION: Stable appearing bilateral pleural effusions with air pockets within. There is been interval removal of chest tubes bilaterally. Bilateral infiltrates right greater than left increasing particularly in the right lower lobe. Electronically Signed   By: Inez Catalina M.D.   On: 07/25/2016 11:57   Dg Chest Port 1 View  Result Date: 07/24/2016 CLINICAL DATA:  Respiratory  failure. EXAM: PORTABLE CHEST 1 VIEW COMPARISON:  07/23/2016. FINDINGS: Interim removal of bilateral chest 2. Right PICC line stable position. Cardiomegaly again noted. Persistent but improved bilateral pulmonary infiltrates. Persistent basilar atelectasis . Persistent bilateral pleural effusions, right side greater than left. Bilateral pleural effusions are again noted .  IMPRESSION: 1. Interim removal of bilateral chest tubes. No pneumothorax. Right PICC line stable position. 2. Persistent bilateral pulmonary infiltrates and bilateral pleural effusions, improved slightly from prior exam. Persistent bibasilar atelectasis . Electronically Signed   By: Marcello Moores  Register   On: 07/24/2016 07:06   Scheduled Meds: . atorvastatin  40 mg Per Tube q1800  . budesonide (PULMICORT) nebulizer solution  0.25 mg Nebulization BID  . carvedilol  3.125 mg Oral Once  . carvedilol  6.25 mg Oral BID WC  . Chlorhexidine Gluconate Cloth  6 each Topical Daily  . ciprofloxacin  500 mg Oral BID  . furosemide  40 mg Intravenous Daily  . guaiFENesin  600 mg Oral BID  . [START ON 07/27/2016] haemophilus B polysaccharide conjugate vaccine  0.5 mL Intramuscular Once  . heparin subcutaneous  5,000 Units Subcutaneous Q8H  . insulin aspart  0-20 Units Subcutaneous TID WC  . insulin glargine  8 Units Subcutaneous Daily  . ipratropium  0.5 mg Nebulization TID  . lactose free nutrition  237 mL Oral TID BM  . levalbuterol  1.25 mg Nebulization TID  . loratadine  10 mg Oral Daily  . magnesium oxide  200 mg Oral Daily  . mouth rinse  15 mL Mouth Rinse BID  . [START ON 07/27/2016] meningococcal oligosaccharide  0.5 mL Intramuscular Once  . multivitamin with minerals  1 tablet Oral Daily  . [START ON 07/27/2016] pneumococcal 23 valent vaccine  0.5 mL Intramuscular Tomorrow-1000  . polyethylene glycol  17 g Oral Daily  . QUEtiapine  25 mg Per Tube QHS  . senna-docusate  1 tablet Oral BID  . sodium chloride flush  10-40 mL Intracatheter  Q12H  . sodium chloride HYPERTONIC  4 mL Nebulization Q12H   Continuous Infusions:   LOS: 16 days    Time spent> 34mins Kynslee Baham, MD PhD [facility] Pager [phone]  If 7PM-7AM, please contact night-coverage [email or web address] Password [facility] 07/25/2016, 6:28 PM

## 2016-07-25 NOTE — Progress Notes (Signed)
Patient son at bedside, inform CSW patient spouse will be at hospital later to follow up with possible SNF placement.  CSW will plan to meet with patient spouse upon arrival to further discuss patient discharge plan.

## 2016-07-26 DIAGNOSIS — J470 Bronchiectasis with acute lower respiratory infection: Secondary | ICD-10-CM

## 2016-07-26 DIAGNOSIS — E43 Unspecified severe protein-calorie malnutrition: Secondary | ICD-10-CM

## 2016-07-26 DIAGNOSIS — J869 Pyothorax without fistula: Secondary | ICD-10-CM

## 2016-07-26 LAB — BASIC METABOLIC PANEL
ANION GAP: 4 — AB (ref 5–15)
BUN: 28 mg/dL — ABNORMAL HIGH (ref 6–20)
CALCIUM: 8.7 mg/dL — AB (ref 8.9–10.3)
CHLORIDE: 95 mmol/L — AB (ref 101–111)
CO2: 37 mmol/L — ABNORMAL HIGH (ref 22–32)
CREATININE: 0.68 mg/dL (ref 0.61–1.24)
GFR calc non Af Amer: >60 mL/min (ref >60)
Glucose, Bld: 215 mg/dL — ABNORMAL HIGH (ref 65–99)
Potassium: 4.5 mmol/L (ref 3.5–5.1)
SODIUM: 136 mmol/L (ref 135–145)

## 2016-07-26 LAB — GLUCOSE, CAPILLARY
GLUCOSE-CAPILLARY: 191 mg/dL — AB (ref 65–99)
GLUCOSE-CAPILLARY: 216 mg/dL — AB (ref 65–99)
GLUCOSE-CAPILLARY: 300 mg/dL — AB (ref 65–99)

## 2016-07-26 LAB — CBC
HCT: 28 % — ABNORMAL LOW (ref 39.0–52.0)
HEMOGLOBIN: 8.6 g/dL — AB (ref 13.0–17.0)
MCH: 27.3 pg (ref 26.0–34.0)
MCHC: 30.7 g/dL (ref 30.0–36.0)
MCV: 88.9 fL (ref 78.0–100.0)
Platelets: 134 10*3/uL — ABNORMAL LOW (ref 150–400)
RBC: 3.15 MIL/uL — ABNORMAL LOW (ref 4.22–5.81)
RDW: 16 % — AB (ref 11.5–15.5)
WBC: 15 10*3/uL — AB (ref 4.0–10.5)

## 2016-07-26 MED ORDER — FUROSEMIDE 10 MG/ML IJ SOLN
40.0000 mg | Freq: Two times a day (BID) | INTRAMUSCULAR | Status: DC
  Administered 2016-07-26 – 2016-07-29 (×7): 40 mg via INTRAVENOUS
  Filled 2016-07-26 (×7): qty 4

## 2016-07-26 NOTE — Progress Notes (Signed)
[facility] Attending Note  Presenting HPI:  76 y.o. w/ HTN, DM, COPD, BTX and diastolic dysfxn, severe thrombocytopenia and recurrent pleural effusions w/ exudative by analysis back in Feb. He was being followed by Heme for ITP (did not respond to IVIG, or rituxan). Was admitted once again on 3/10 w/ HR 150s, shortness of breath and increased LE edema. Also felt to be in refractory ITP. Ultimately consulted by cards for SVT and surgery as well as heme for splenectomy. Treated supportively including a left thoracentesis on 2/14 which was reported as bloody but no fluid analysis sent. He returns to the ICU s/p splenectomy on 3/15 on the vent. PCCM asked to assist w/ care.   Subjective:  Reports his dyspnea does not seem to be improving and he is adamant stalemate. Continues to have intermittent cough productive of minimal phlegm. He is using his flutter valve but did not have a chance to try the percussion vest today. I did start the patient on hypertonic saline nebs yesterday. Denies any subjective fever, chills, or sweats.  Review of Systems:  Denies any abdominal pain or nausea. Denies any chest pain, tightness, or pressure. Denies any headache or vision changes.  Temp:  [97.6 F (36.4 C)-98.7 F (37.1 C)] 97.6 F (36.4 C) (03/28 0513) Pulse Rate:  [79-89] 89 (03/28 0836) Resp:  [14-18] 18 (03/28 0836) BP: (103-134)/(50-76) 121/54 (03/28 0513) SpO2:  [92 %-97 %] 92 % (03/28 0836) Weight:  [186 lb 11.7 oz (84.7 kg)] 186 lb 11.7 oz (84.7 kg) (03/28 0513)  Gen.: No distress. Wife at bedside. Awake. Integument: No rash or bruising on exposed skin. Warm and dry. HEENT: No scleral icterus. Moist mucous membranes. Pupils symmetric and reactive. Pulmonary: Slightly diminished breath sounds bilateral lung bases. Only mildly increased work of breathing heavily ambulated around the unit just now. No accessory muscle use. Cardiovascular: Pitting lower show any edema. Regular rate.  Unable to appreciate JVD given body positioning.  CBC Latest Ref Rng & Units 07/26/2016 07/25/2016 07/24/2016  WBC 4.0 - 10.5 K/uL 15.0(H) 15.2(H) 13.7(H)  Hemoglobin 13.0 - 17.0 g/dL 8.6(L) 8.7(L) 9.1(L)  Hematocrit 39.0 - 52.0 % 28.0(L) 28.7(L) 29.8(L)  Platelets 150 - 400 K/uL 134(L) 171 219    BMP Latest Ref Rng & Units 07/26/2016 07/25/2016 07/24/2016  Glucose 65 - 99 mg/dL 215(H) 199(H) 165(H)  BUN 6 - 20 mg/dL 28(H) 29(H) 33(H)  Creatinine 0.61 - 1.24 mg/dL 0.68 0.69 0.81  Sodium 135 - 145 mmol/L 136 136 140  Potassium 3.5 - 5.1 mmol/L 4.5 4.4 4.0  Chloride 101 - 111 mmol/L 95(L) 97(L) 103  CO2 22 - 32 mmol/L 37(H) 35(H) 32  Calcium 8.9 - 10.3 mg/dL 8.7(L) 8.6(L) 8.5(L)    IMAGING/STUDIES: PORT CXR 3/26:  Previously reviewed by me. Persistent bilateral lower lobe small pleural effusions versus opacity secondary to pneumonia. No pneumothorax appreciated. Low lung volumes. Bilateral chest tubes removed. CT CHEST W/O 3/27:  Personally reviewed by me. Small bilateral pleural effusions with some air pockets within the fluid post previous chest tubes. Bilateral lower lobe predominant consolidation with air bronchograms particularly on the right.  MICROBIOLOGY: MRSA PCR 3/11:  Negative Right Pleural Fluid Culture 3/16:  Pseudomonas aeruginosa (pan-sensitive) Blood Cultures x2 3/16:  Negative  Urine Culture 3/17:  Negative    ANTIBIOTICS: Vancomycin 3/16 - 3/18 Zosyn 3/16 - 3/18 Fortaz 3/18 - 3/24 Cipro 3/24 >>>  LINES/TUBES: OETT 3/15 - 3/16 (self extubated); 3/16 - 3/22 Bilateral Chest tubes 3/16 -  3/25 RUE DL PICC 3/17>>>  SIGNIFICANT EVENTS: 03/10 - Admit 03/15 - Splenectomy 03/16 - Right thoracentesis:  800cc appearing as old blood 03/20 - Started TPA/DNase 03/21 - Lasix stopped due to metabolic alkalosis & Amiodarone stopped 03/22 - Finished TPA/DNase 03/27 - Requested PCCM to continue to follow given tenuous respiratory status  ASSESSMENT/PLAN:  76 y.o. male with ITP  status post splenectomy. Postoperative course complicated by Pseudomonas pneumonia and parapneumonic effusions. Status post bilateral chest tubes and intrapleural lytic therapy. Patient does have a small amount of residual pleural fluid and air pockets likely from previous chest tubes. Clinically the patient seems to be stable without signs of worsening or symptoms that would suggest developing empyema. I did increase his regimen for airway clearance yesterday to promote mucous clearance with his underlying bronchiectasis.   1. Bilateral complicated pleural effusions: Status post bilateral chest tubes and intrapleural lytic therapy which completed on 3/22. Chest tubes removed on 3/25. If patient's clinical status worsens or there are other signs/symptoms suggestive of developing empyema would consider thoracic surgery consultation for VATS. 2. Bilateral Pseudomonas pneumonia: Patient completing course of antibiotics with ciprofloxacin scheduled to end on 3/30. Recommend continuing current antibiotic regimen unless clinical status deteriorates and then would broaden antibiotic therapy, reculture the patient, and consult thoracic surgery. 3. Acute hypoxic respiratory failure: Secondary to #1 and #2 above. Continuing pulmonary toilette and physical therapy. Weaning FiO2 as tolerated. 4. Bronchiectasis: Known history of bronchiectasis. Continuing 3% hypertonic saline nebulized twice daily along with chest physiotherapy with percussion vest favored over a flutter valve as long as patient tolerates. Continuing Xopenex and Atrovent nebulized 3 times daily. Continuing Pulmicort nebulized twice daily.   Further management per primary service and other consultants.   I have spent a total of 37 minutes of time today caring for the patient, reviewing the patient's electronic medical record, and with more than 50% of that time spent coordinating care with the patient as well as reviewing the continuing plan of care with  the patient and his wife at bedside.  Sonia Baller Ashok Cordia, M.D. [facility] Pager:  [phone] After 3pm or if no response, call [phone] 11:11 AM 07/26/16

## 2016-07-26 NOTE — Progress Notes (Signed)
PROGRESS NOTE    [name]  QAS:[id number] DOB: [birth year] DOA: 07/08/2016 PCP: [name], MD  Brief Narrative:  [name] a 76 y.o.malewith medical history significant of hypertension, hyperlipidemia, diabetes mellitus (possible steroid induced), COPD  (home o2 3liters started in 06/2016), bronchiectasis, GERD, idiopathic thrombocytopenia, dCHF, bilateral pleural effusion,PSVT who presents with shortness of breath and bilateral leg edema.  He was recently hospitalized from 2/10 to 2/20 for pleural effusion( had thoracentesis and diuresed), SVT, ITP, he Returns to the ED on 3/11 for HR >150 noted while he was checking Vitals at home. ER performed electrical cardioverstion due to concomitant complaint of dyspnea and hypotension, he was started on Cardizem and amiodarone infusion and admitted to SDU under hospitalist service.  Had severe refractory ITP so Oncology consulted and recommended Splenectomy so General Surgery consulted patient underwent splenectomy on 3/15. Patient is s/p Left Thoracentesis 3/14, culture+ pseudomonas. He was  transferred to ICU intubated post op. He self extubated on 3/15, had to be reintubated due to respiratory failure. Extubated on 3/22  He has bilatearal chest tube placed on 3/16, he received TPA/DNAse due to loculated pleural effusion, chest tube removed on 3/25. He is transferred back to hospitalist service on 3/25  Repeat ct chest  On 3/27 showed continued consolidation; We will consider broaden abx if wbc continue to trend up or if developing fever. Continue lasix if bp and cr allows, nutrition supplement as well. PCCM reconsulted for additional recc's  Still not bringing up much phlegm and has significant leg swelling so IV Lasix was doubled. 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 Complicated further by baseline COPD and Bronchiectasis    Disorders of fluid, electrolyte, and acid-base balance   Anemia in acute on chronic illness   Severe protein-calorie malnutrition (HCC)   Anasarca   Severe muscle deconditioning   Septic shock (HCC)   Acute encephalopathy   Arterial hypotension   Pneumonia   Pleural effusion, bilateral  SVT At home and in ER (presenting symptom) Paroxysmal A-fib with RVR subsequently - s/p electrical cardioversion in ER following by Cardizem (stopped on 3/17)/amiodarone infusion (stopped on 3/21), echo with lvef 60-65%, grade II diastolic dysfunction - now on coreg, CHADSVASC score 4( age, htn,dm2, diastolic chf), per cardiology Dr Ellyn Hack " suspect that the arrhythmia was related to his critical illness. May warrant OP monitor to determine +/- true Dx of Afib. Will ask EP to review EKGs, but for now, esp. In light of his recent acute illness, I would opt for NOT STARTING DOAC.""increasing BB dose as BP tolerates. " - Cardiology signed off, he needs outpatient cardiology follow up.  ITP  - failed multiple line of medical therapy,  plt count 5 on admission, nose bleed (was on around clock amicar), blood loss anemia - s/p Transfusions of 4 pools of Platelets s/p 4prbc transfusion, s/p Splenectomy on 3/15 -plt has been normalized sicne 3/22; Platelets decreased to 134 from 171 -surgery continue to follow daily, oncology available as needed,  post splenectomy vaccinations per general surgery.  -Repeat CBC in AM  Hypoxic respiratory failure/septic shock from Pseudomonas Pneumonia -Self extubated  post op on 3/15, but reintubated due to hypoxic respiratory failure and septic  shock/pna/empyema -Improving, extubated on 3/22, on Ciprofloxacin 500 mg po BID for pna/empyema, blood culture no growth, pleural fluids from 3/16 +PSEUDOMONAS AERUGINOSA -Currently on 2-3liter o2supplement at rest, his o2 sats dropped to 86% briefly with increased activity on 3/26 -continue wean o2 as tolerated  B/l pleural effusions, hydropneumothorax , bilateral pneumonia with pseudomonas empyema, dCHF- chronic, 2+ pedal edema, anasarca - S/p Left Thoracentesis done by IR  3/14 after platelet infusions , -s/p bilateral chest tube placed on 3/16 with TPA/DNAse ( not a candidate for VATs per pulmonology), cytology negative,  culture + psuedomonas, abx 11/14 as of 3/27, chest tubes removed on 3/25,  -patient continued to have congested cough, oxygen requirement, wbc seems trending up, Chest CT on 3/27 "Bilateral infiltrates right greater than left increasing particularly in the right lower lobe. Stable appearing bilateral pleural effusions with air pockets within." - Case was discussed with thoracic surgery Dr Roxy Manns over the phone by Dr. Erlinda Hong yesterday on 3/27, Dr Roxy Manns reviewed the chest CT who recommend continue aggressive pulmonary PT, abx, may consider bronchoscopy, nothing surgical currently, but if infection does not improve, he is at risk of developing empyema in the future in this immunocompromised patient" - Pulmonology signed off on 3/26, I have talked to Pulmonology Dr Ashok Cordia who graciously agreed to continue to follow patient , they will see patient on 3/28 and recommended continue Abx Regimen and broaden Abxs if clinical status worsens. -consider broaden abx if wbc continue to trend up or if developing fever.  -Increased Lasix to 40 mg IV BD if bp and cr allows, nutrition supplement as well. 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  ICU delirium:  -required precedex and seroquel =Decreased seroquel to 25mg  qhs with a plan to d/c in 1-2 days if no confusion.  Insulin dependent Diabetes mellitus( prednisone induced? He was on prednisone for ITP) - A1c 6.6 - off steroid,  -continue adjust lantus/ssi  HLD  -continue Atovastatin 40 mg po  Abnormal thyroid functions -TSH was 5, free T4 1.39 - recommend these be rechecked in 3-4 wks  Deconditioning and Generalized Weakness -PT Recommending SNF  DVT prophylaxis: TED Hose, SCD's Code Status: FULL CODE Family Communication: Discussed with Patient's wife at Bedside Disposition Plan: SNF when medically stable  Consultants:   Cardiology (signed off on 3/26)  Oncology ( Oncology Dr Gudena/Dr Magrinat/Lindsey Holley Dexter, NP follow peripherally now (if any hematology issues arise [phone].)  General Surgery (continue to follow daily,but oked with discharge)  Interventional Radiology  Pulmonology /critical care ( signed off on 3/26, reconsulted and evaluated patient 3/28)   Procedures:   Left Thoracentesis by IR on 3/14  s/p Transfusions of 4 pools of Platelets   s/p 4prbc transfusion  s/p Splenectomy on 3/15  intubation on 3/15, self extubation on 3/15, reintubation on 3/15, extubation on 3/22  Bilateral chest tube placement on 3/16, removed on 3/25  R picc line placement on 3/17  Aline discontinued on 3/23  Antimicrobials: Anti-infectives    Start     Dose/Rate Route Frequency Ordered Stop   07/22/16 1400  ciprofloxacin (CIPRO) tablet 500 mg  500 mg Oral 2 times daily 07/22/16 1223 07/28/16 2359   07/16/16 1200  cefTAZidime (FORTAZ) 2 g in dextrose 5 % 50 mL IVPB  Status:  Discontinued     2 g 100 mL/hr over 30 Minutes Intravenous Every 8 hours 07/16/16  0942 07/22/16 1207   07/15/16 0600  vancomycin (VANCOCIN) 1,250 mg in sodium chloride 0.9 % 250 mL IVPB  Status:  Discontinued     1,250 mg 166.7 mL/hr over 90 Minutes Intravenous Every 12 hours 07/14/16 1901 07/16/16 0942   07/15/16 0200  piperacillin-tazobactam (ZOSYN) IVPB 3.375 g  Status:  Discontinued     3.375 g 12.5 mL/hr over 240 Minutes Intravenous Every 8 hours 07/14/16 1901 07/16/16 0942   07/14/16 1930  vancomycin (VANCOCIN) 1,500 mg in sodium chloride 0.9 % 500 mL IVPB     1,500 mg 250 mL/hr over 120 Minutes Intravenous  Once 07/14/16 1901 07/14/16 2251   07/14/16 1930  piperacillin-tazobactam (ZOSYN) IVPB 3.375 g     3.375 g 100 mL/hr over 30 Minutes Intravenous  Once 07/14/16 1901 07/14/16 2058     Subjective: Seen and examined at bedside and stated Leg swelling was worse and that he was unable to cough up much sputum. No nausea or vomiting but felt ok. No other concerns or complaints at this time.   Objective: Vitals:   07/25/16 1248 07/25/16 1454 07/25/16 2104 07/26/16 0513  BP: (!) 103/50  134/76 (!) 121/54  Pulse: 79  86 87  Resp: 14  18 18   Temp: 98.1 F (36.7 C)  98.7 F (37.1 C) 97.6 F (36.4 C)  TempSrc: Oral  Oral Oral  SpO2: 96% 97% 97% 93%  Weight:    84.7 kg (186 lb 11.7 oz)  Height:        Intake/Output Summary (Last 24 hours) at 07/26/16 0819 Last data filed at 07/26/16 0531  Gross per 24 hour  Intake              960 ml  Output             1450 ml  Net             -490 ml   Filed Weights   07/24/16 0600 07/25/16 0446 07/26/16 0513  Weight: 83.3 kg (183 lb 10.3 oz) 83.2 kg (183 lb 6.4 oz) 84.7 kg (186 lb 11.7 oz)   Examination: Physical Exam:  Constitutional: NAD and appears calm and comfortable sitting bedside Eyes: Lids and conjunctivae normal, sclerae anicteric  ENMT: External Ears, Nose appear normal. Grossly normal hearing.  Neck: Appears normal, supple, no cervical masses, normal ROM, no appreciable thyromegaly, no  JVD Respiratory: Diminished to auscultation bilaterally with some rales and mild crackles. Normal respiratory effort and patient is not tachypenic. No accessory muscle use.  Cardiovascular: RRR, no murmurs / rubs / gallops. S1 and S2 auscultated. 2+-3 Lower Extremity Edema.  Abdomen: Soft, non-tender, non-distended. No masses palpated. No appreciable hepatosplenomegaly. Bowel sounds positive x4.  GU: Deferred. Musculoskeletal: No clubbing / cyanosis of digits/nails. No joint deformity upper and lower extremities.  Skin: No rashes, lesions, ulcers on limited skin evaluation. No induration; Warm and dry.  Neurologic: CN 2-12 grossly intact with no focal deficits. Romberg sign cerebellar reflexes not assessed.  Psychiatric: Normal judgment and insight. Alert and oriented x 3. Normal mood and appropriate affect.   Data Reviewed: I have personally reviewed following labs and imaging studies  CBC:  Recent Labs Lab 07/20/16 0437 07/22/16 0442 07/23/16 0500  07/24/16 0355 07/25/16 0428 07/26/16 0457  WBC 13.4* 13.8* 12.7* 13.7* 15.2* 15.0*  NEUTROABS 11.7*  --   --  10.9*  --   --   HGB 9.1* 9.9* 9.5* 9.1* 8.7* 8.6*  HCT 29.2* 33.0* 31.3* 29.8* 28.7* 28.0*  MCV 89.0 90.2 89.4 89.0 87.8 88.9  PLT 187 281 266 219 171 976*   Basic Metabolic Panel:  Recent Labs Lab 07/22/16 0442 07/23/16 0500 07/24/16 0355 07/25/16 0428 07/26/16 0457  NA 144 142 140 136 136  K 3.8 3.9 4.0 4.4 4.5  CL 105 104 103 97* 95*  CO2 32 33* 32 35* 37*  GLUCOSE 227* 193* 165* 199* 215*  BUN 39* 36* 33* 29* 28*  CREATININE 0.85 0.80 0.81 0.69 0.68  CALCIUM 8.7* 8.7* 8.5* 8.6* 8.7*  MG  --  1.7 1.7  --   --   PHOS  --  2.5  --   --   --    GFR: Estimated Creatinine Clearance: 86.3 mL/min (by C-G formula based on SCr of 0.68 mg/dL). Liver Function Tests:  Recent Labs Lab 07/22/16 0442  AST 13*  ALT 11*  ALKPHOS 55  BILITOT 0.9  PROT 5.3*  ALBUMIN 2.6*   No results for input(s): LIPASE, AMYLASE in  the last 168 hours. No results for input(s): AMMONIA in the last 168 hours. Coagulation Profile: No results for input(s): INR, PROTIME in the last 168 hours. Cardiac Enzymes: No results for input(s): CKTOTAL, CKMB, CKMBINDEX, TROPONINI in the last 168 hours. BNP (last 3 results)  Recent Labs  02/17/16 0948 05/05/16 1450  PROBNP 40.0 17.0   HbA1C: No results for input(s): HGBA1C in the last 72 hours. CBG:  Recent Labs Lab 07/25/16 0750 07/25/16 1157 07/25/16 1707 07/25/16 2048 07/26/16 0720  GLUCAP 186* 165* 348* 154* 191*   Lipid Profile: No results for input(s): CHOL, HDL, LDLCALC, TRIG, CHOLHDL, LDLDIRECT in the last 72 hours. Thyroid Function Tests: No results for input(s): TSH, T4TOTAL, FREET4, T3FREE, THYROIDAB in the last 72 hours. Anemia Panel: No results for input(s): VITAMINB12, FOLATE, FERRITIN, TIBC, IRON, RETICCTPCT in the last 72 hours. Sepsis Labs:  Recent Labs Lab 07/21/16 1805 07/22/16 0442 07/23/16 0500  PROCALCITON 0.21 0.28 0.18    No results found for this or any previous visit (from the past 240 hour(s)).   Radiology Studies: Ct Chest Wo Contrast  Result Date: 07/25/2016 CLINICAL DATA:  Follow-up pleural effusion EXAM: CT CHEST WITHOUT CONTRAST TECHNIQUE: Multidetector CT imaging of the chest was performed following the standard protocol without IV contrast. COMPARISON:  07/17/2016 FINDINGS: Cardiovascular: Somewhat limited due to the lack of IV contrast. Diffuse aortic calcifications are noted without aneurysmal dilatation. Coronary calcifications are seen. No significant cardiac enlargement is noted. Mediastinum/Nodes: The thoracic inlet is within normal limits. Right peritracheal lymph node is noted measuring less than 10 mm in short axis. Scattered small mediastinal and hilar lymph nodes are seen likely reactive in nature. Lungs/Pleura: Bilateral pleural effusions are noted right greater than left with air pockets similar to that seen on the  prior exam. The previously seen chest tubes have been removed in the interval. Bilateral lower lobe consolidation is noted considerably greater on the right than the left. These have increased in the interval from the prior exam. Patchy changes are noted in the right upper lobe. Upper Abdomen: Stable from the previous exam. Musculoskeletal: Degenerative change of thoracic spine is noted. IMPRESSION: Stable appearing bilateral pleural effusions with air pockets within. There is been interval  removal of chest tubes bilaterally. Bilateral infiltrates right greater than left increasing particularly in the right lower lobe. Electronically Signed   By: Inez Catalina M.D.   On: 07/25/2016 11:57   Scheduled Meds: . atorvastatin  40 mg Per Tube q1800  . budesonide (PULMICORT) nebulizer solution  0.25 mg Nebulization BID  . carvedilol  3.125 mg Oral Once  . carvedilol  6.25 mg Oral BID WC  . Chlorhexidine Gluconate Cloth  6 each Topical Daily  . ciprofloxacin  500 mg Oral BID  . furosemide  40 mg Intravenous Daily  . guaiFENesin  600 mg Oral BID  . [START ON 07/27/2016] haemophilus B polysaccharide conjugate vaccine  0.5 mL Intramuscular Once  . heparin subcutaneous  5,000 Units Subcutaneous Q8H  . insulin aspart  0-20 Units Subcutaneous TID WC  . insulin glargine  8 Units Subcutaneous Daily  . ipratropium  0.5 mg Nebulization TID  . lactose free nutrition  237 mL Oral TID BM  . levalbuterol  1.25 mg Nebulization TID  . loratadine  10 mg Oral Daily  . magnesium oxide  200 mg Oral Daily  . mouth rinse  15 mL Mouth Rinse BID  . [START ON 07/27/2016] meningococcal oligosaccharide  0.5 mL Intramuscular Once  . multivitamin with minerals  1 tablet Oral Daily  . [START ON 07/27/2016] pneumococcal 23 valent vaccine  0.5 mL Intramuscular Tomorrow-1000  . polyethylene glycol  17 g Oral Daily  . QUEtiapine  25 mg Per Tube QHS  . senna-docusate  1 tablet Oral BID  . sodium chloride flush  10-40 mL Intracatheter  Q12H  . sodium chloride HYPERTONIC  4 mL Nebulization Q12H   Continuous Infusions:   LOS: 17 days   Kerney Elbe, DO [facility] Pager [phone]  If 7PM-7AM, please contact night-coverage [email or web address] Password [facility] 07/26/2016, 8:19 AM

## 2016-07-26 NOTE — Progress Notes (Signed)
Patient preferred to use flutter this morning vs chest vest. Chest vest still in room. Will attempt at next time.

## 2016-07-26 NOTE — Progress Notes (Signed)
   07/26/16 1100  Clinical Encounter Type  Visited With Patient and family together  Visit Type Initial;Psychological support;Spiritual support  Referral From Family;Patient  Consult/Referral To Chaplain  Spiritual Encounters  Spiritual Needs Emotional;Prayer;Other (Comment) (Pastoral Conversation/support)  Stress Factors  Patient Stress Factors Health changes;Other (Comment)  Family Stress Factors Health changes;Major life changes   I was asked to pray with the patient and his wife when I asked if the patient's wife would witness an Forensic scientist.  The patient's goal is to get well enough to go to a rehab facility and get back to his normal life. He requested that I pray for complete healing.  We prayed and they requested follow-up.   Please, contact Spiritual Care for further assistance.   [place] M.Div.

## 2016-07-26 NOTE — Progress Notes (Addendum)
Pt's family to meet with Tammy from [facility] today at 10:30am. Once pt's family accepts bed offer, anticipate 24 hours for auth to be received for SNF. Will provide updates once available.    Update: Pt's family agreeable to [facility]. Per Tammy, with [facility], they will initiate prior auth today.   Wandra Feinstein, MSW, LCSW [phone] (coverage)

## 2016-07-27 ENCOUNTER — Inpatient Hospital Stay (HOSPITAL_COMMUNITY): Payer: Medicare HMO

## 2016-07-27 DIAGNOSIS — D638 Anemia in other chronic diseases classified elsewhere: Secondary | ICD-10-CM

## 2016-07-27 DIAGNOSIS — D696 Thrombocytopenia, unspecified: Secondary | ICD-10-CM

## 2016-07-27 LAB — GLUCOSE, CAPILLARY
GLUCOSE-CAPILLARY: 189 mg/dL — AB (ref 65–99)
GLUCOSE-CAPILLARY: 151 mg/dL — AB (ref 65–99)
GLUCOSE-CAPILLARY: 125 mg/dL — AB (ref 65–99)
Glucose-Capillary: 320 mg/dL — ABNORMAL HIGH (ref 65–99)
Glucose-Capillary: 120 mg/dL — ABNORMAL HIGH (ref 65–99)

## 2016-07-27 LAB — COMPREHENSIVE METABOLIC PANEL
ALBUMIN: 2 g/dL — AB (ref 3.5–5.0)
ALT: 23 U/L (ref 17–63)
ANION GAP: 8 (ref 5–15)
AST: 25 U/L (ref 15–41)
Alkaline Phosphatase: 111 U/L (ref 38–126)
BILIRUBIN TOTAL: 0.4 mg/dL (ref 0.3–1.2)
BUN: 18 mg/dL (ref 6–20)
CHLORIDE: 91 mmol/L — AB (ref 101–111)
CO2: 40 mmol/L — ABNORMAL HIGH (ref 22–32)
Calcium: 8.6 mg/dL — ABNORMAL LOW (ref 8.9–10.3)
Creatinine, Ser: 0.58 mg/dL — ABNORMAL LOW (ref 0.61–1.24)
GFR calc Af Amer: >60 mL/min (ref >60)
GFR calc non Af Amer: >60 mL/min (ref >60)
GLUCOSE: 176 mg/dL — AB (ref 65–99)
POTASSIUM: 3.8 mmol/L (ref 3.5–5.1)
SODIUM: 139 mmol/L (ref 135–145)
TOTAL PROTEIN: 5.3 g/dL — AB (ref 6.5–8.1)

## 2016-07-27 LAB — CBC WITH DIFFERENTIAL/PLATELET
Basophils Absolute: 0 10*3/uL (ref 0.0–0.1)
Basophils Relative: 0 %
Eosinophils Absolute: 0.1 10*3/uL (ref 0.0–0.7)
Eosinophils Relative: 1 %
HEMATOCRIT: 27 % — AB (ref 39.0–52.0)
HEMOGLOBIN: 8.4 g/dL — AB (ref 13.0–17.0)
Lymphocytes Relative: 15 %
Lymphs Abs: 1.9 10*3/uL (ref 0.7–4.0)
MCH: 27.5 pg (ref 26.0–34.0)
MCHC: 31.1 g/dL (ref 30.0–36.0)
MCV: 88.2 fL (ref 78.0–100.0)
MONO ABS: 1.1 10*3/uL — AB (ref 0.1–1.0)
MONOS PCT: 9 %
NEUTROS ABS: 9.4 10*3/uL — AB (ref 1.7–7.7)
NEUTROS PCT: 75 %
PLATELETS: 67 10*3/uL — AB (ref 150–400)
RBC: 3.06 MIL/uL — ABNORMAL LOW (ref 4.22–5.81)
RDW: 16 % — AB (ref 11.5–15.5)
WBC: 12.5 10*3/uL — ABNORMAL HIGH (ref 4.0–10.5)

## 2016-07-27 LAB — PHOSPHORUS: Phosphorus: 2 mg/dL — ABNORMAL LOW (ref 2.5–4.6)

## 2016-07-27 LAB — MAGNESIUM: Magnesium: 1.5 mg/dL — ABNORMAL LOW (ref 1.7–2.4)

## 2016-07-27 MED ORDER — SODIUM CHLORIDE 0.9 % IV SOLN: 250.0000 mL | INTRAVENOUS | Status: DC | PRN

## 2016-07-27 MED ORDER — SODIUM CHLORIDE 0.9% FLUSH: 3.0000 mL | INTRAVENOUS | Status: DC | PRN

## 2016-07-27 MED ORDER — MAGNESIUM SULFATE 2 GM/50ML IV SOLN
2.0000 g | Freq: Once | INTRAVENOUS | Status: AC
  Administered 2016-07-27: 2 g via INTRAVENOUS
  Filled 2016-07-27: qty 50

## 2016-07-27 MED ORDER — SODIUM CHLORIDE 0.9% FLUSH
3.0000 mL | Freq: Two times a day (BID) | INTRAVENOUS | Status: DC
  Administered 2016-07-31: 3 mL via INTRAVENOUS

## 2016-07-27 NOTE — Progress Notes (Signed)
[name] Attending Note  Presenting HPI:  76 y.o. w/ HTN, DM, COPD, BTX and diastolic dysfxn, severe thrombocytopenia and recurrent pleural effusions w/ exudative by analysis back in Feb. He was being followed by Heme for ITP (did not respond to IVIG, or rituxan). Was admitted once again on 3/10 w/ HR 150s, shortness of breath and increased LE edema. Also felt to be in refractory ITP. Ultimately consulted by cards for SVT and surgery as well as heme for splenectomy. Treated supportively including a left thoracentesis on 2/14 which was reported as bloody but no fluid analysis sent. He returns to the ICU s/p splenectomy on 3/15 on the vent. PCCM asked to assist w/ Chang.   Subjective:  Patient only tolerating percussion vest for brief periods at a time. Requesting intermittent breaks. No acute events overnight. Reports dyspnea is improving. Percussion vest is helping with patient's cough per his report. Denies any new subjective fever, chills, or sweats.  Review of Systems:  Denies any chest pain or pressure. Denies any abdominal pain or nausea. Denies any headache or vision changes.  Temp:  [97.8 F (36.6 C)-98.4 F (36.9 C)] 97.8 F (36.6 C) (03/29 0500) Pulse Rate:  [80-81] 81 (03/29 0500) Resp:  [16-18] 18 (03/29 0500) BP: (103-120)/(48-56) 114/56 (03/29 0500) SpO2:  [91 %-98 %] 91 % (03/29 0909)  Gen.: Comfortable. Wife at bedside. No distress. Integument: No rash on exposed skin. Warm. Dry. HEENT: Moist mucous membranes. No scleral icterus or injection. Pupils symmetric. Pulmonary: Again slightly diminished breath sounds in the bases. Normal work of breathing on nasal cannula oxygen. Speaking in complete sentences. Cardiovascular: Lower extreme edema persists. Regular rate. No appreciable JVD with body positioning. Abdomen: Soft. Nondistended. Normal bowel sounds.  CBC Latest Ref Rng & Units 07/27/2016 07/26/2016 07/25/2016  WBC 4.0 - 10.5 K/uL 12.5(H) 15.0(H)  15.2(H)  Hemoglobin 13.0 - 17.0 g/dL 8.4(L) 8.6(L) 8.7(L)  Hematocrit 39.0 - 52.0 % 27.0(L) 28.0(L) 28.7(L)  Platelets 150 - 400 K/uL 67(L) 134(L) 171    BMP Latest Ref Rng & Units 07/27/2016 07/26/2016 07/25/2016  Glucose 65 - 99 mg/dL 176(H) 215(H) 199(H)  BUN 6 - 20 mg/dL 18 28(H) 29(H)  Creatinine 0.61 - 1.24 mg/dL 0.58(L) 0.68 0.69  Sodium 135 - 145 mmol/L 139 136 136  Potassium 3.5 - 5.1 mmol/L 3.8 4.5 4.4  Chloride 101 - 111 mmol/L 91(L) 95(L) 97(L)  CO2 22 - 32 mmol/L 40(H) 37(H) 35(H)  Calcium 8.9 - 10.3 mg/dL 8.6(L) 8.7(L) 8.6(L)    IMAGING/STUDIES: PORT CXR 3/26:  Previously reviewed by me. Persistent bilateral lower lobe small pleural effusions versus opacity secondary to pneumonia. No pneumothorax appreciated. Low lung volumes. Bilateral chest tubes removed. CT CHEST W/O 3/27:  Previously reviewed by me. Small bilateral pleural effusions with some air pockets within the fluid post previous chest tubes. Bilateral lower lobe predominant consolidation with air bronchograms particularly on the right.  MICROBIOLOGY: MRSA PCR 3/11:  Negative Right Pleural Fluid Culture 3/16:  Pseudomonas aeruginosa (pan-sensitive) Blood Cultures x2 3/16:  Negative  Urine Culture 3/17:  Negative    ANTIBIOTICS: Vancomycin 3/16 - 3/18 Zosyn 3/16 - 3/18 Fortaz 3/18 - 3/24 Cipro 3/24 >>>  LINES/TUBES: OETT 3/15 - 3/16 (self extubated); 3/16 - 3/22 Bilateral Chest tubes 3/16 - 3/25 RUE DL PICC 3/17>>>  SIGNIFICANT EVENTS: 03/10 - Admit 03/15 - Splenectomy 03/16 - Right thoracentesis:  800cc appearing as old blood 03/20 - Started TPA/DNase 03/21 - Lasix stopped due to metabolic alkalosis &  Amiodarone stopped 03/22 - Finished TPA/DNase 03/27 - Requested PCCM to continue to follow given tenuous respiratory status  ASSESSMENT/PLAN:  76 y.o. male with ITP status post splenectomy. Postoperative course complicated by Pseudomonas pneumonia with parapneumonic effusions. Post bilateral chest tubes  and intrapleural lytic therapy. Improving leukocytosis is reassuring. Patient with improving airway clearance utilizing his current percussion vest and hypertonic nebs. Patient and wife educated at length regarding the length of recovery he will require.  1. Bilateral complicated pleural effusions: Status post bilateral chest tubes and intrapleural lytic therapy. Completed on 3/22 with chest tube removal on 3/25. 2. Bilateral Pseudomonas pneumonia: Completing course of ciprofloxacin ending on 3/30. Plan for repeat chest x-ray/chest CT imaging in 4 weeks to ensure resolution of her opacities. 3. Acute hypoxic respiratory failure: Secondary to #1 and #2 above. Continuing pulmonary toilette and ambulating 3 times a day. Weaning FiO2 for saturation greater than 88%. 4. Bronchiectasis: Continuing percussion vest every 4 hours & 3% hypertonic saline nebulized twice a day. Continuing Xopenex, Atrovent, & Pulmicort nebs as ordered.  5. Follow-up:  Patient scheduled an appointment in our office on 4/10 at noon with Rexene Edison, NP.   Further management per primary service and other consultants. Patient stable to transition to rehabilitation from a pulmonary standpoint. Please contact the rounding physician over the holiday 3 day weekend if there are any further questions or concerns.  I have spent a total of 42 minutes of time today caring for the patient, reviewing the patient's electronic medical record, and with more than 50% of that time spent coordinating Chang with the patient as well as reviewing the continuing plan of Chang with the patient, his wife, and his nurse at bedside.  Sonia Baller Ashok Cordia, M.D. [facility] Pager:  [phone] After 3pm or if no response, call [phone] 9:37 AM 07/27/16

## 2016-07-27 NOTE — Progress Notes (Signed)
Pt doing well.  Discussed with nurse to give vaccines today.  Diet as tolerated.  Will leave follow up info in discharge tab.

## 2016-07-27 NOTE — Progress Notes (Signed)
Inpatient Diabetes Program Recommendations  AACE/ADA: New Consensus Statement on Inpatient Glycemic Control (2015)  Target Ranges:  Prepandial:   less than 140 mg/dL      Peak postprandial:   less than 180 mg/dL (1-2 hours)      Critically ill patients:  140 - 180 mg/dL   Results for [name], [name] (MRN [id number]) as of 07/27/2016 10:48  Ref. Range 07/26/2016 07:20 07/26/2016 12:30 07/26/2016 16:27 07/26/2016 23:08 07/27/2016 07:33  Glucose-Capillary Latest Ref Range: 65 - 99 mg/dL 191 (H) 216 (H) 300 (H) 189 (H) 151 (H)   Review of Glycemic Control  Current orders for Inpatient glycemic control: Lantus 8 units daily, Novolog 0-20 units TID with meals  Inpatient Diabetes Program Recommendations: Correction (SSI): Please consider adding Novolog 0-5 units QHS for bedtime correction scale. Insulin - Meal Coverage: Post prandial glucose is consistently elevated. Please consider ordering Novolog 5 units TID with meals for meal coverage.  Thanks, Barnie Alderman, RN, MSN, CDE Diabetes Coordinator Inpatient Diabetes Program [phone] (Team Pager from 8am to 5pm)

## 2016-07-27 NOTE — Progress Notes (Signed)
PT Cancellation Note  Patient Details Name: Merland Holness MRN: [id number] DOB: 10/14/1940   Cancelled Treatment:     attempted x 2 to see.  AM pt had just finished working with OT.                                                                                 PM sleeping, spouse requested I return tomorrow   Rica Koyanagi  PTA [facility]  Acute  Rehab Pager      [phone]

## 2016-07-27 NOTE — Discharge Instructions (Signed)
[place] Office Phone Number [phone]   POST OP INSTRUCTIONS  Always review your discharge instruction sheet given to you by the facility where your surgery was performed.  IF YOU HAVE DISABILITY OR FAMILY LEAVE FORMS, YOU MUST BRING THEM TO THE OFFICE FOR PROCESSING.  DO NOT GIVE THEM TO YOUR DOCTOR.  1. A prescription for pain medication may be given to you upon discharge.  Take your pain medication as prescribed, if needed.  If narcotic pain medicine is not needed, then you may take acetaminophen (Tylenol) or ibuprofen (Advil) as needed. 2. Take your usually prescribed medications unless otherwise directed 3. If you need a refill on your pain medication, please contact your pharmacy.  They will contact our office to request authorization.  Prescriptions will not be filled after 5pm or on week-ends. 4. You should eat very light the first 24 hours after surgery, such as soup, crackers, pudding, etc.  Resume your normal diet the day after surgery 5. It is common to experience some constipation if taking pain medication after surgery.  Increasing fluid intake and taking a stool softener will usually help or prevent this problem from occurring.  A mild laxative (Milk of Magnesia or Miralax) should be taken according to package directions if there are no bowel movements after 48 hours. 6. You may shower in 48 hours.  The surgical glue will flake off in 2-3 weeks.   7. ACTIVITIES:  No strenuous activity or heavy lifting for 4 weeks.   a. You may drive when you no longer are taking prescription pain medication, you can comfortably wear a seatbelt, and you can safely maneuver your car and apply brakes. b. RETURN TO WORK:  _________n/a_____________ Dennis Bast should see your doctor in the office for a follow-up appointment approximately three-four weeks after your surgery.    WHEN TO CALL YOUR DOCTOR: 1. Fever over 101.0 2. Nausea and/or vomiting. 3. Extreme swelling or  bruising. 4. Continued bleeding from incision. 5. Increased pain, redness, or drainage from the incision.  The clinic staff is available to answer your questions during regular business hours.  Please dont hesitate to call and ask to speak to one of the nurses for clinical concerns.  If you have a medical emergency, go to the nearest emergency room or call 911.  A surgeon from [facility] is always on call at the hospital.  For further questions, please visit [email or web address]

## 2016-07-27 NOTE — Evaluation (Signed)
Occupational Therapy Evaluation Patient Details Name: [name] MRN: [id number] DOB: 1940/11/02 Today's Date: 07/27/2016    History of Present Illness [name] is a 76 y.o. male with medical history significant of recently diagnosed with ITP, bronchiectasis, hypertension, diabetes mellitus type 2, afib,Presented to the ED 3/10/18complaining of shortness of breath. 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 Complicated further by baseline COPD and Bronchiectasis . Extubated 07/20/16. Bilateral chest tubes in place.   Clinical Impression   Pt admitted as above currently demonstrating generalized weakness and deconditioning (see OT problem list below). He should benefit from acute OT to assist in maximizing independence with ADL's and functional mobility related in preparation for d/c to SNF Rehab. He is currently +2 Min-Mod A for safety/lines & requires consistent vc's for breathing techniques. Monitor O2 SATs and HR during session.    Follow Up Recommendations  SNF;Supervision/Assistance - 24 hour    Equipment Recommendations  Other (comment) (Defer to next venue)    Recommendations for Other Services       Precautions / Restrictions Precautions Precautions: Fall Precaution Comments: monitor HR and sats,  Restrictions Weight Bearing Restrictions: No      Mobility Bed Mobility Overal bed mobility:  (Pt in recliner upon OT arrival)                Transfers Overall transfer level: Needs assistance Equipment used: Rolling walker (2 wheeled) Transfers: Sit to/from [facility] Sit to Stand: Min assist Stand pivot transfers: Mod assist;+2 physical assistance;+2 safety/equipment       General transfer comment: From 3:1 x2 and recliner chair x1 during this session. vc's for safety and sequencing as well as pursed lip breathing.    Balance Overall balance assessment: Needs  assistance Sitting-balance support: No upper extremity supported;Feet supported Sitting balance-Leahy Scale: Fair     Standing balance support: Bilateral upper extremity supported;During functional activity Standing balance-Leahy Scale: Fair                             ADL either performed or assessed with clinical judgement   ADL Overall ADL's : Needs assistance/impaired     Grooming: Wash/dry hands;Wash/dry face;Standing;Minimal assistance Grooming Details (indicate cue type and reason): Standing at sink in bathroom, fatigues easily req vc for pursed lip breathing as he tends to breath out of his mouth. Portable O2 on 3L via nasal cannula and pt reports that he cannot feel it, O2 was assessed and @90 , HR 94, pt cont w/ c/o SOB, increased O2 to 6L per his request then returned to 3L after pt reported feeling better. Upper Body Bathing: Moderate assistance;Sitting;Cueing for safety   Lower Body Bathing: Moderate assistance;Sit to/from stand;Cueing for safety   Upper Body Dressing : Minimal assistance;Sitting   Lower Body Dressing: Moderate assistance;Maximal assistance;Sit to/from stand;Cueing for safety   Toilet Transfer: Minimal assistance;+2 for safety/equipment;BSC;RW;Ambulation (Performed today 3:1 over toilet in bathroom) Toilet Transfer Details (indicate cue type and reason): VC's for pursed lip breathing techniques Toileting- Clothing Manipulation and Hygiene: Moderate assistance;Sit to/from stand;Cueing for safety Toileting - Clothing Manipulation Details (indicate cue type and reason): Pt leaning on RW and req vc's for safety Tub/ Shower Transfer:  (NT)   Functional mobility during ADLs: Minimal assistance;+2 for safety/equipment;Rolling walker (Portable O2) General ADL Comments: Pt/spouse were educated in role of OT and assessment was performed. Pt was agreeable to ADL retraining session for toilet transfer,  LB bathing and standing at sink briefly for grooming.  He requires consistent vc's for breathing techniques (O2 SATS remained in 90-95 range during session on 3-6L per pt request - note only on 6L via nasal cannula less than 1 min). Pt required multiple rest breaks and then requested ambulation in hall, second person followed w/ chair for safety secondary to deconditioning and lines. Pt amb in hallway from his bathroom, past next room and back. Sat on EOB for rest break at conclusion. vc's for breathing techniques, HR 96 and O2 SATs @ 90 on 3L via nasal cannula. RN in room to replace condom cath and apply cream to buttocks. RN to assist back to recliner chair.     Vision Baseline Vision/History: Wears glasses Patient Visual Report: No change from baseline       Perception     Praxis      Pertinent Vitals/Pain Pain Assessment: No/denies pain Faces Pain Scale: No hurt     Hand Dominance Right   Extremity/Trunk Assessment Upper Extremity Assessment Upper Extremity Assessment: Generalized weakness   Lower Extremity Assessment Lower Extremity Assessment: Generalized weakness       Communication Communication Communication: No difficulties   Cognition Arousal/Alertness: Awake/alert Behavior During Therapy: Flat affect Overall Cognitive Status: Within Functional Limits for tasks assessed                                 General Comments: slow to respond at times.   General Comments       Exercises     Shoulder Instructions      Home Living Family/patient expects to be discharged to:: Skilled nursing facility Living Arrangements: Spouse/significant other Available Help at Discharge: Family Type of Home: House Home Access: Level entry     Home Layout: Two level;Able to live on main level with bedroom/bathroom Alternate Level Stairs-Number of Steps: flight             Home Equipment: Cane - single point;Walker - 2 wheels;Shower seat   Additional Comments: adjustable bed is upstairs; pt walking 2-3 miles a  day until a few months ago(from anther encounter)      Prior Functioning/Environment Level of Independence: Independent                 OT Problem List: Decreased strength;Decreased knowledge of precautions;Pain;Increased edema;Cardiopulmonary status limiting activity;Decreased activity tolerance;Decreased knowledge of use of DME or AE      OT Treatment/Interventions: Self-care/ADL training;DME and/or AE instruction;Therapeutic activities;Therapeutic exercise;Energy conservation;Patient/family education    OT Goals(Current goals can be found in the care plan section) Acute Rehab OT Goals Patient Stated Goal: To walk Time For Goal Achievement: 08/10/16 Potential to Achieve Goals: Fair  OT Frequency: Min 2X/week   Barriers to D/C:            Co-evaluation              End of Session Equipment Utilized During Treatment: Gait belt;Rolling walker;Oxygen Nurse Communication: Mobility status;Other (comment) (Pt needs new condom cath and requesting "cream" for his buttocks)  Activity Tolerance: Patient tolerated treatment well;Patient limited by fatigue Patient left: with nursing/sitter in room;with family/visitor present (Sitting EOB, RN in room to replace condom cath and assist back to recliner)  OT Visit Diagnosis: Other abnormalities of gait and mobility (R26.89);Muscle weakness (generalized) (M62.81);Pain;Other (comment) (Fall risk; deconditioned; Need to monitor O2 SATs and HR)  Time: 7619-5093 OT Time Calculation (min): 36 min Charges:  OT General Charges $OT Visit: 1 Procedure OT Evaluation $OT Eval Moderate Complexity: 1 Procedure OT Treatments $Self Care/Home Management : 8-22 mins G-Codes: OT G-codes **NOT FOR INPATIENT CLASS** Functional Assessment Tool Used: AM-PAC 6 Clicks Daily Activity;Clinical judgement Functional Limitation: Self care Self Care Current Status (O6712): At least 60 percent but less than 80 percent impaired, limited or  restricted Self Care Goal Status (W5809): At least 20 percent but less than 40 percent impaired, limited or restricted    Almyra Deforest, OTR/L 07/27/2016, 11:21 AM

## 2016-07-27 NOTE — Progress Notes (Addendum)
PROGRESS NOTE    [name]  SWN:[id number] DOB: [birth year] DOA: 07/08/2016 PCP: [name], MD  Brief Narrative:  [name] a 76 y.o.malewith medical history significant of hypertension, hyperlipidemia, diabetes mellitus (possible steroid induced), COPD  (home o2 3liters started in 06/2016), bronchiectasis, GERD, idiopathic thrombocytopenia, dCHF, bilateral pleural effusion,PSVT who presents with shortness of breath and bilateral leg edema.  He was recently hospitalized from 2/10 to 2/20 for pleural effusion( had thoracentesis and diuresed), SVT, ITP, he Returns to the ED on 3/11 for HR >150 noted while he was checking Vitals at home. ER performed electrical cardioverstion due to concomitant complaint of dyspnea and hypotension, he was started on Cardizem and amiodarone infusion and admitted to SDU under hospitalist service.  Had severe refractory ITP so Oncology consulted and recommended Splenectomy so General Surgery consulted patient underwent splenectomy on 3/15. Patient is s/p Left Thoracentesis 3/14, culture+ pseudomonas. He was  transferred to ICU intubated post op. He self extubated on 3/15, had to be reintubated due to respiratory failure. Extubated on 3/22  He has bilatearal chest tube placed on 3/16, he received TPA/DNAse due to loculated pleural effusion, chest tube removed on 3/25. He is transferred back to hospitalist service on 3/25  Repeat ct chest  On 3/27 showed continued consolidation; We will consider broaden abx if wbc continue to trend up or if developing fever. Continue lasix if bp and cr allows, nutrition supplement as well. PCCM reconsulted for additional recc's  Still not bringing up much phlegm and has significant leg swelling so IV Lasix was doubled. Repeat CXR in AM and continue to Monitor. Pulmonary cleared patient to go to SNF however his platelet count started dropping so Heparin was stopped because of concern for HIT.   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 Complicated further by baseline COPD and Bronchiectasis    Disorders of fluid, electrolyte, and acid-base balance   Anemia in acute on chronic illness   Severe protein-calorie malnutrition (HCC)   Anasarca   Severe muscle deconditioning   Septic shock (HCC)   Acute encephalopathy   Arterial hypotension   Pneumonia   Pleural effusion, bilateral  SVT At home and in ER (presenting symptom) Paroxysmal A-fib with RVR subsequently - s/p electrical cardioversion in ER following by Cardizem (stopped on 3/17)/amiodarone infusion (stopped on 3/21), echo with lvef 60-65%, grade II diastolic dysfunction - now on coreg, CHADSVASC score 4( age, htn,dm2, diastolic chf), per cardiology Dr Ellyn Hack " suspect that the arrhythmia was related to his critical illness. May warrant OP monitor to determine +/- true Dx of Afib. Will ask EP to review EKGs, but for now, esp. In light of his recent acute illness, I would opt for NOT STARTING DOAC.""increasing BB dose as BP tolerates. " - Cardiology signed off, he needs outpatient cardiology follow up.  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  -Repeat CBC in AM  Hypoxic respiratory failure/septic shock from Pseudomonas Pneumonia -Self extubated post op on 3/15, but reintubated due to hypoxic respiratory failure and septic shock/pna/empyema -Improving, extubated on 3/22, on Ciprofloxacin 500 mg po BID for pna/empyema, blood culture no growth, pleural fluids from 3/16 +PSEUDOMONAS AERUGINOSA -Currently on 2-3liter o2supplement at rest, his o2 sats dropped to 86% briefly with increased activity on 3/26 -continue wean o2 as tolerated -Plan is for Repeat CXR/Chest CT in 4 weeks to ensure resolution of Opacities  B/l pleural effusions, hydropneumothorax , bilateral pneumonia with pseudomonas empyema, dCHF- chronic, 2+ pedal edema, anasarca - S/p Left Thoracentesis done by IR  3/14 after platelet infusions , -s/p bilateral chest tube placed on 3/16 with TPA/DNAse ( not a candidate for VATs per pulmonology), cytology negative,  culture + psuedomonas, abx 11/14 as of 3/27, chest tubes removed on 3/25,  -patient continued to have congested cough, oxygen requirement, wbc seems trending up, Chest CT on 3/27 "Bilateral infiltrates right greater than left increasing particularly in the right lower lobe. Stable appearing bilateral pleural effusions with air pockets within." - Case was discussed with thoracic surgery Dr Roxy Manns over the phone by Dr. Erlinda Hong yesterday on 3/27, Dr Roxy Manns reviewed the chest CT who recommend continue aggressive pulmonary PT, abx, may consider bronchoscopy, nothing surgical currently, but if infection does not improve, he is at risk of developing empyema in the future in this immunocompromised patient" - Pulmonology signed off on 3/26, I have talked to Pulmonology Dr Ashok Cordia who graciously agreed to continue to follow patient , they will see patient on 3/28 and recommended continue Abx Regimen and broaden Abxs if clinical status worsens. -consider broaden abx if wbc continue to trend up or if developing fever.  -Increased  Lasix to 40 mg IV BD if bp and cr allows, nutrition supplement as well. 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 -As Above  ICU delirium:  -Required precedex and seroquel -Decreased seroquel to 25mg  qhs with a plan to d/c in 1-2 days if no confusion.  Insulin dependent Diabetes mellitus( prednisone induced? He was on prednisone for ITP) - A1c 6.6 - off steroids,  -continue adjust lantus/ssi  HLD  -continue Atovastatin 40 mg po  Abnormal thyroid functions -TSH was 5, free T4 1.39 - recommend these be rechecked in 3-4 wks  Deconditioning and Generalized Weakness -PT Recommending SNF  DVT prophylaxis: TED Hose, SCD's; Heparin D/C'd Code Status: FULL CODE Family Communication: Discussed with Patient's wife at Bedside Disposition Plan: SNF when medically stable  Consultants:   Cardiology (signed off on 3/26)  Oncology ( Oncology Dr Gudena/Dr Magrinat/Lindsey Holley Dexter, NP follow peripherally now (if any hematology issues arise p [phone].)  General Surgery (continue to follow daily,but oked with discharge)  Interventional Radiology  Pulmonology /critical care ( signed off on 3/26, reconsulted and evaluated patient 3/28)   Procedures:   Left Thoracentesis by IR on 3/14  s/p Transfusions of 4 pools of Platelets   s/p 4prbc transfusion  s/p Splenectomy on 3/15  intubation on 3/15, self extubation on 3/15, reintubation on 3/15, extubation on 3/22  Bilateral chest tube placement on 3/16, removed on 3/25  R picc line placement on 3/17  Aline discontinued on  3/23  Antimicrobials: Anti-infectives    Start     Dose/Rate Route Frequency Ordered Stop   07/22/16 1400  ciprofloxacin (CIPRO) tablet 500 mg     500 mg Oral 2 times daily 07/22/16 1223 07/28/16 2359   07/16/16 1200  cefTAZidime (FORTAZ) 2 g in dextrose 5 % 50 mL IVPB  Status:  Discontinued     2 g 100 mL/hr over 30 Minutes Intravenous Every 8 hours 07/16/16 0942 07/22/16 1207   07/15/16 0600  vancomycin (VANCOCIN) 1,250 mg in sodium chloride 0.9 % 250 mL IVPB  Status:  Discontinued     1,250 mg 166.7 mL/hr over 90 Minutes Intravenous Every 12 hours 07/14/16 1901 07/16/16 0942   07/15/16 0200  piperacillin-tazobactam (ZOSYN) IVPB 3.375 g  Status:  Discontinued     3.375 g 12.5 mL/hr over 240 Minutes Intravenous Every 8 hours 07/14/16 1901 07/16/16 0942   07/14/16 1930  vancomycin (VANCOCIN) 1,500 mg in sodium chloride 0.9 % 500 mL IVPB     1,500 mg 250 mL/hr over 120 Minutes Intravenous  Once 07/14/16 1901 07/14/16 2251   07/14/16 1930  piperacillin-tazobactam (ZOSYN) IVPB 3.375 g     3.375 g 100 mL/hr over 30 Minutes Intravenous  Once 07/14/16 1901 07/14/16 2058     Subjective: Seen and examined at bedside and leg swelling was much improved and better and he felt slightly better. Was frustrated as platelet count dropped and explained to him about suspicion for HIT. Asked Dr. [name] to stop by and disccuss with the patient. Patient's Foley fell off so accurate I's and O's were not obtained today. No other concerns or complaints at this time.   Objective: Vitals:   07/27/16 0909 07/27/16 1521 07/27/16 2031 07/27/16 2058  BP:  (!) 105/58 (!) 99/52   Pulse:  83 77   Resp:  20 18   Temp:  98.2 F (36.8 C) 98.5 F (36.9 C)   TempSrc:  Oral Oral   SpO2: 91% 99% 98% 94%  Weight:      Height:        Intake/Output Summary (Last 24 hours) at 07/27/16 2204 Last data filed at 07/27/16 2057  Gross per 24 hour  Intake              480 ml  Output             3150 ml  Net             -2670 ml   Filed Weights   07/24/16 0600 07/25/16 0446 07/26/16 0513  Weight: 83.3 kg (183 lb 10.3 oz) 83.2 kg (183 lb 6.4 oz) 84.7 kg (186 lb 11.7 oz)   Examination: Physical Exam:  Constitutional: NAD and appears calm and comfortable sitting bedside in chair Eyes: Lids and conjunctivae normal, sclerae anicteric  ENMT: External Ears, Nose appear normal. Grossly normal hearing.  Neck: Appears normal, supple, no cervical masses, normal ROM, no appreciable thyromegaly, no JVD Respiratory: Diminished to auscultation bilaterally with some rales and mild crackles. Normal respiratory effort and patient is not tachypenic. No accessory muscle use.  Cardiovascular: RRR, no murmurs / rubs / gallops. S1 and S2 auscultated. 1-2+ Lower Extremity Edema wearing TED Hose.  Abdomen: Soft, non-tender, non-distended. No masses palpated. No appreciable hepatosplenomegaly. Bowel sounds positive x4.  GU: Deferred. Condom catheter fell off.  Musculoskeletal: No clubbing / cyanosis of digits/nails. No joint deformity upper and lower extremities.  Skin: No rashes, lesions, ulcers on limited skin evaluation. No induration; Warm and dry.  Neurologic: CN 2-12 grossly intact with no focal deficits. Romberg sign cerebellar reflexes not assessed.  Psychiatric: Normal judgment and insight. Alert and oriented x 3. Normal mood and appropriate affect.   Data Reviewed: I have personally reviewed following labs and imaging studies  CBC:  Recent Labs Lab 07/23/16 0500 07/24/16 0355 07/25/16 0428 07/26/16 0457 07/27/16 0423  WBC 12.7* 13.7* 15.2* 15.0* 12.5*  NEUTROABS  --  10.9*  --   --  9.4*  HGB 9.5* 9.1* 8.7* 8.6* 8.4*  HCT 31.3* 29.8* 28.7* 28.0* 27.0*  MCV 89.4 89.0 87.8 88.9 88.2  PLT 266 219 171 134* 67*   Basic Metabolic Panel:  Recent Labs Lab 07/23/16 0500 07/24/16 0355 07/25/16 0428 07/26/16 0457 07/27/16 0423  NA 142 140 136 136 139  K 3.9 4.0 4.4 4.5 3.8  CL 104 103 97* 95* 91*  CO2 33*  32 35* 37* 40*  GLUCOSE 193* 165* 199* 215* 176*  BUN 36* 33* 29* 28* 18  CREATININE 0.80 0.81 0.69 0.68 0.58*  CALCIUM 8.7* 8.5* 8.6* 8.7* 8.6*  MG 1.7 1.7  --   --  1.5*  PHOS 2.5  --   --   --  2.0*   GFR: Estimated Creatinine Clearance: 86.3 mL/min (A) (by C-G formula based on SCr of 0.58 mg/dL (L)). Liver Function Tests:  Recent Labs Lab 07/22/16 0442 07/27/16 0423  AST 13* 25  ALT 11* 23  ALKPHOS 55 111  BILITOT 0.9 0.4  PROT 5.3* 5.3*  ALBUMIN 2.6* 2.0*   No results for input(s): LIPASE, AMYLASE in the last 168 hours. No results for input(s): AMMONIA in the last 168 hours. Coagulation Profile: No results for input(s): INR, PROTIME in the last 168 hours. Cardiac Enzymes: No results for input(s): CKTOTAL, CKMB, CKMBINDEX, TROPONINI in the last 168 hours. BNP (last 3 results)  Recent Labs  02/17/16 0948 05/05/16 1450  PROBNP 40.0 17.0   HbA1C: No results for input(s): HGBA1C in the last 72 hours. CBG:  Recent Labs Lab 07/26/16 1627 07/26/16 2308 07/27/16 0733 07/27/16 1152 07/27/16 1740  GLUCAP 300* 189* 151* 320* 125*   Lipid Profile: No results for input(s): CHOL, HDL, LDLCALC, TRIG, CHOLHDL, LDLDIRECT in the last 72 hours. Thyroid Function Tests: No results for input(s): TSH, T4TOTAL, FREET4, T3FREE, THYROIDAB in the last 72 hours. Anemia Panel: No results for input(s): VITAMINB12, FOLATE, FERRITIN, TIBC, IRON, RETICCTPCT in the last 72 hours. Sepsis Labs:  Recent Labs Lab 07/21/16 1805 07/22/16 0442 07/23/16 0500  PROCALCITON 0.21 0.28 0.18    No results found for this or any previous visit (from the past 240 hour(s)).   Radiology Studies: Dg Chest Port 1 View  Result Date: 07/27/2016 CLINICAL DATA:  Pneumonia EXAM: PORTABLE CHEST 1 VIEW COMPARISON:  Chest x-ray of July 24 2016 and chest CT scan of July 25, 2016. FINDINGS: The lungs are mildly hypoinflated. Moderate-sized bilateral pleural effusions remain. There is some gas within the  right pleural space and very early. Atelectasis or pneumonia at the right lung base is present with some atelectasis at the left lung base is well. The cardiac silhouette is not enlarged. The pulmonary vascularity is mildly prominent. The right sided PICC line tip projects over the midportion of the SVC. IMPRESSION: The patient is positioned  in a lordotic matter today which accentuates the hypoinflation. Persistent bilateral pleural effusions with bibasilar atelectasis or pneumonia. There has not been dramatic change since 24 July 2016. Electronically Signed   By: David  Martinique M.D.   On: 07/27/2016 07:24   Scheduled Meds: . atorvastatin  40 mg Per Tube q1800  . budesonide (PULMICORT) nebulizer solution  0.25 mg Nebulization BID  . carvedilol  3.125 mg Oral Once  . carvedilol  6.25 mg Oral BID WC  . ciprofloxacin  500 mg Oral BID  . furosemide  40 mg Intravenous BID  . guaiFENesin  600 mg Oral BID  . haemophilus B polysaccharide conjugate vaccine  0.5 mL Intramuscular Once  . insulin aspart  0-20 Units Subcutaneous TID WC  . insulin glargine  8 Units Subcutaneous Daily  . ipratropium  0.5 mg Nebulization TID  . lactose free nutrition  237 mL Oral TID BM  . levalbuterol  1.25 mg Nebulization TID  . loratadine  10 mg Oral Daily  . magnesium oxide  200 mg Oral Daily  . mouth rinse  15 mL Mouth Rinse BID  . multivitamin with minerals  1 tablet Oral Daily  . polyethylene glycol  17 g Oral Daily  . QUEtiapine  25 mg Per Tube QHS  . senna-docusate  1 tablet Oral BID  . sodium chloride flush  10-40 mL Intracatheter Q12H  . sodium chloride flush  3 mL Intravenous Q12H  . sodium chloride HYPERTONIC  4 mL Nebulization Q12H   Continuous Infusions:   LOS: 18 days   Kerney Elbe, DO [facility] Pager [phone]  If 7PM-7AM, please contact night-coverage [email or web address] Password [facility] 07/27/2016, 10:04 PM

## 2016-07-27 NOTE — Progress Notes (Signed)
HEMATOLOGY-ONCOLOGY PROGRESS NOTE  SUBJECTIVE: Continuing to make progress with his breathing. Some shortness of breath even at rest. No evidence of bleeding.  OBJECTIVE: REVIEW OF SYSTEMS:   Eyes: Denies blurriness of vision Ears, nose, mouth, throat, and face: Denies mucositis or sore throat Respiratory:Shortness of breath to minimal exertion  Cardiovascular: Denies palpitation, chest discomfort Gastrointestinal:  Denies nausea, heartburn or change in bowel habits Skin: Denies abnormal skin rashes Lymphatics: Denies new lymphadenopathy or easy bruising Neurological:Denies numbness, tingling or new weaknesses Behavioral/Psych: Mood is stable, no new changes  Extremities:Profound  lower extremity edema  All other systems were reviewed with the patient and are negative.  PHYSICAL EXAMINATION: ECOG PERFORMANCE STATUS: 3 - Symptomatic, >50% confined to bed  Vitals:   07/27/16 0500 07/27/16 1521  BP: (!) 114/56 (!) 105/58  Pulse: 81 83  Resp: 18 20  Temp: 97.8 F (36.6 C) 98.2 F (36.8 C)   Filed Weights   07/24/16 0600 07/25/16 0446 07/26/16 0513  Weight: 183 lb 10.3 oz (83.3 kg) 183 lb 6.4 oz (83.2 kg) 186 lb 11.7 oz (84.7 kg)    GENERAL:alert, no distress and comfortable SKIN: skin color, texture, turgor are normal, no rashes or significant lesions EYES: normal, Conjunctiva are pink and non-injected, sclera clear OROPHARYNX:no exudate, no erythema and lips, buccal mucosa, and tongue normal  NECK: supple, thyroid normal size, non-tender, without nodularity LYMPH:  no palpable lymphadenopathy in the cervical, axillary or inguinal LUNGS:Diminished breath sounds and crackles at the bases  HEART:Irregular with lower extremity edema 3+  ABDOMEN:abdomen soft, non-tender and normal bowel sounds Musculoskeletal:no cyanosis of digits and no clubbing  NEURO: alert & oriented x 3 with fluent speech, no focal motor/sensory deficits  LABORATORY DATA:  I have reviewed the data as  listed CMP Latest Ref Rng & Units 07/27/2016 07/26/2016 07/25/2016  Glucose 65 - 99 mg/dL 176(H) 215(H) 199(H)  BUN 6 - 20 mg/dL 18 28(H) 29(H)  Creatinine 0.61 - 1.24 mg/dL 0.58(L) 0.68 0.69  Sodium 135 - 145 mmol/L 139 136 136  Potassium 3.5 - 5.1 mmol/L 3.8 4.5 4.4  Chloride 101 - 111 mmol/L 91(L) 95(L) 97(L)  CO2 22 - 32 mmol/L 40(H) 37(H) 35(H)  Calcium 8.9 - 10.3 mg/dL 8.6(L) 8.7(L) 8.6(L)  Total Protein 6.5 - 8.1 g/dL 5.3(L) - -  Total Bilirubin 0.3 - 1.2 mg/dL 0.4 - -  Alkaline Phos 38 - 126 U/L 111 - -  AST 15 - 41 U/L 25 - -  ALT 17 - 63 U/L 23 - -    Lab Results  Component Value Date   WBC 12.5 (H) 07/27/2016   HGB 8.4 (L) 07/27/2016   HCT 27.0 (L) 07/27/2016   MCV 88.2 07/27/2016   PLT 67 (L) 07/27/2016   NEUTROABS 9.4 (H) 07/27/2016    ASSESSMENT AND PLAN: 1. Worsening thrombocytopenia: Patient had a previous history of ITP and required splenectomy. This is highly suspicious for HIT. I agree with the current plan of discontinuing all heparin products. I discussed the patient and his family that it may take a few days with a platelet count to recover HIT antibody panel has been ordered. After he gets discharged to the nursing home he will need weekly CBC with differential.  Please ensure that he gets the CBCs and followers those results to our office fax [phone]   2. Lower extremity edema: Due to heart and hypoalbuminemia  3. Anemia due to chronic disease 4. Severe COPD 5. Recent bilateral chest tube placement

## 2016-07-28 DIAGNOSIS — G9340 Encephalopathy, unspecified: Secondary | ICD-10-CM

## 2016-07-28 DIAGNOSIS — E878 Other disorders of electrolyte and fluid balance, not elsewhere classified: Secondary | ICD-10-CM

## 2016-07-28 LAB — CBC WITH DIFFERENTIAL/PLATELET
BASOS ABS: 0 10*3/uL (ref 0.0–0.1)
BASOS PCT: 0 %
EOS ABS: 0.1 10*3/uL (ref 0.0–0.7)
Eosinophils Relative: 1 %
HCT: 27.3 % — ABNORMAL LOW (ref 39.0–52.0)
HEMOGLOBIN: 8.5 g/dL — AB (ref 13.0–17.0)
Lymphocytes Relative: 15 %
Lymphs Abs: 1.6 10*3/uL (ref 0.7–4.0)
MCH: 27.3 pg (ref 26.0–34.0)
MCHC: 31.1 g/dL (ref 30.0–36.0)
MCV: 87.8 fL (ref 78.0–100.0)
MONOS PCT: 9 %
Monocytes Absolute: 1 10*3/uL (ref 0.1–1.0)
NEUTROS PCT: 75 %
Neutro Abs: 8.2 10*3/uL — ABNORMAL HIGH (ref 1.7–7.7)
Platelets: 36 10*3/uL — ABNORMAL LOW (ref 150–400)
RBC: 3.11 MIL/uL — ABNORMAL LOW (ref 4.22–5.81)
RDW: 16.1 % — AB (ref 11.5–15.5)
WBC: 10.9 10*3/uL — ABNORMAL HIGH (ref 4.0–10.5)

## 2016-07-28 LAB — COMPREHENSIVE METABOLIC PANEL
ALBUMIN: 1.9 g/dL — AB (ref 3.5–5.0)
ALT: 20 U/L (ref 17–63)
ANION GAP: 4 — AB (ref 5–15)
AST: 21 U/L (ref 15–41)
Alkaline Phosphatase: 101 U/L (ref 38–126)
BUN: 13 mg/dL (ref 6–20)
CHLORIDE: 92 mmol/L — AB (ref 101–111)
CO2: 43 mmol/L — ABNORMAL HIGH (ref 22–32)
CREATININE: 0.67 mg/dL (ref 0.61–1.24)
Calcium: 8.4 mg/dL — ABNORMAL LOW (ref 8.9–10.3)
GFR calc Af Amer: >60 mL/min (ref >60)
GFR calc non Af Amer: >60 mL/min (ref >60)
Glucose, Bld: 156 mg/dL — ABNORMAL HIGH (ref 65–99)
POTASSIUM: 3.7 mmol/L (ref 3.5–5.1)
SODIUM: 139 mmol/L (ref 135–145)
Total Bilirubin: 0.7 mg/dL (ref 0.3–1.2)
Total Protein: 5.4 g/dL — ABNORMAL LOW (ref 6.5–8.1)

## 2016-07-28 LAB — MAGNESIUM: MAGNESIUM: 1.6 mg/dL — AB (ref 1.7–2.4)

## 2016-07-28 LAB — PHOSPHORUS: PHOSPHORUS: 2.6 mg/dL (ref 2.5–4.6)

## 2016-07-28 LAB — GLUCOSE, CAPILLARY
GLUCOSE-CAPILLARY: 161 mg/dL — AB (ref 65–99)
Glucose-Capillary: 323 mg/dL — ABNORMAL HIGH (ref 65–99)
Glucose-Capillary: 168 mg/dL — ABNORMAL HIGH (ref 65–99)

## 2016-07-28 LAB — HEPARIN INDUCED PLATELET AB (HIT ANTIBODY): HEPARIN INDUCED PLT AB: 0.245 {OD_unit} (ref 0.000–0.400)

## 2016-07-28 MED ORDER — MAGNESIUM SULFATE 2 GM/50ML IV SOLN
2.0000 g | Freq: Once | INTRAVENOUS | Status: AC
  Administered 2016-07-28: 2 g via INTRAVENOUS
  Filled 2016-07-28: qty 50

## 2016-07-28 NOTE — Progress Notes (Signed)
Pt discharging to SNF. CSW following.  

## 2016-07-28 NOTE — Progress Notes (Signed)
qPhysical Therapy Treatment Patient Details Name: [name] MRN: [id number] DOB: 1941-02-14 Today's Date: 07/28/2016    History of Present Illness [name] is a 76 y.o. male with medical history significant of recently diagnosed with ITP, bronchiectasis, hypertension, diabetes mellitus type 2, afib,Presented to the ED 3/10/18complaining of shortness of breath. 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 Complicated further by baseline COPD and Bronchiectasis . Extubated 07/20/16. Bilateral chest tubes in place.    PT Comments    Pt OOB in recliner.  Assisted with amb a greater distance.  Still requires + 2 assist for safety.    Follow Up Recommendations  SNF     Equipment Recommendations  None recommended by PT    Recommendations for Other Services       Precautions / Restrictions Precautions Precautions: Fall Precaution Comments: monitor HR and sats,  Restrictions Weight Bearing Restrictions: No    Mobility  Bed Mobility               General bed mobility comments: OOB in recliner  Transfers Overall transfer level: Needs assistance Equipment used: Rolling walker (2 wheeled) Transfers: Sit to/from [employer] Transfers Sit to Stand: Min assist;Mod assist;+2 safety/equipment Stand pivot transfers: Min assist;Mod assist;+2 safety/equipment       General transfer comment: 25% VC's on proper hand placement and turn ciompletion prior to sit plus increased time due to recent ABD surgery  Ambulation/Gait Ambulation/Gait assistance: Min assist;+2 safety/equipment Ambulation Distance (Feet): 85 Feet Assistive device: Rolling walker (2 wheeled) Gait Pattern/deviations: Step-to pattern;Step-through pattern Gait velocity: decreased   General Gait Details: tolerated an increased distance, spouse assisted by following with recliner.  Remained on 3 lts nasal sats ranged from 94 to 87% VC's for  deep purse lip breathing.     Stairs            Wheelchair Mobility    Modified Rankin (Stroke Patients Only)       Balance                                            Cognition Arousal/Alertness: Awake/alert Behavior During Therapy: WFL for tasks assessed/performed Overall Cognitive Status: Within Functional Limits for tasks assessed                                        Exercises      General Comments        Pertinent Vitals/Pain Pain Assessment: No/denies pain    Home Living                      Prior Function            PT Goals (current goals can now be found in the care plan section) Progress towards PT goals: Progressing toward goals    Frequency           PT Plan Current plan remains appropriate    Co-evaluation             End of Session Equipment Utilized During Treatment: Oxygen Activity Tolerance: Patient tolerated treatment well;Patient limited by fatigue Patient left: in chair;with call bell/phone within reach;with nursing/sitter in room;with family/visitor present Nurse Communication: Mobility status PT Visit Diagnosis: Other abnormalities of  gait and mobility (R26.89);Muscle weakness (generalized) (M62.81);Pain     Time: 1002-1020 PT Time Calculation (min) (ACUTE ONLY): 18 min  Charges:  $Gait Training: 8-22 mins                    G Codes:       Rica Koyanagi  PTA [facility]  Acute  Rehab Pager      [phone]

## 2016-07-28 NOTE — Progress Notes (Signed)
PROGRESS NOTE    [name]  DUK:[id number] DOB: 05-04-1940 DOA: 07/08/2016 PCP: [name], MD  Brief Narrative:  [name] a 76 y.o.malewith medical history significant of hypertension, hyperlipidemia, diabetes mellitus (possible steroid induced), COPD  (home o2 3liters started in 06/2016), bronchiectasis, GERD, idiopathic thrombocytopenia, dCHF, bilateral pleural effusion,PSVT who presents with shortness of breath and bilateral leg edema.  He was recently hospitalized from 2/10 to 2/20 for pleural effusion( had thoracentesis and diuresed), SVT, ITP, he Returns to the ED on 3/11 for HR >150 noted while he was checking Vitals at home. ER performed electrical cardioverstion due to concomitant complaint of dyspnea and hypotension, he was started on Cardizem and amiodarone infusion and admitted to SDU under hospitalist service.  Had severe refractory ITP so Oncology consulted and recommended Splenectomy so General Surgery consulted patient underwent splenectomy on 3/15. Patient is s/p Left Thoracentesis 3/14, culture+ pseudomonas. He was  transferred to ICU intubated post op. He self extubated on 3/15, had to be reintubated due to respiratory failure. Extubated on 3/22  He has bilatearal chest tube placed on 3/16, he received TPA/DNAse due to loculated pleural effusion, chest tube removed on 3/25. He is transferred back to hospitalist service on 3/25  Repeat ct chest  On 3/27 showed continued consolidation; We will consider broaden abx if wbc continue to trend up or if developing fever. Continue lasix if bp and cr allows, nutrition supplement as well. PCCM reconsulted for additional recc's  Still not bringing up much phlegm and has significant leg swelling so IV Lasix was doubled. Repeat CXR in AM and continue to Monitor. Pulmonary cleared patient to go to SNF however his platelet count started dropping so Heparin was stopped because of concern for HIT. Oncology suspects that  platelet count may get worse before it gets better and may need to administer Thrombopoietin mimetics like Promacta if platelet count drops below 10,000. Patient slowly feeling better.   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 Complicated further by baseline COPD and Bronchiectasis    Disorders of fluid, electrolyte, and acid-base balance   Anemia in acute on chronic illness   Severe protein-calorie malnutrition (HCC)   Anasarca   Severe muscle deconditioning   Septic shock (HCC)   Acute encephalopathy   Arterial hypotension   Pneumonia   Pleural effusion, bilateral  SVT At home and in ER (presenting symptom) Paroxysmal A-fib with RVR subsequently - s/p electrical cardioversion in ER following by Cardizem (stopped on 3/17)/amiodarone infusion (stopped on 3/21), echo with lvef 60-65%, grade II diastolic dysfunction - now on coreg, CHADSVASC score 4( age, htn,dm2, diastolic chf), per cardiology Dr Ellyn Hack " suspect that the arrhythmia was related to his critical illness. May warrant OP monitor to determine +/- true Dx of Afib. Will ask EP to review EKGs, but for now, esp. In light of his recent acute illness, I would opt for NOT STARTING DOAC.""increasing BB dose as BP tolerates. " - Cardiology signed off, he needs outpatient cardiology follow up.  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  -Repeat CBC in AM  Hypoxic respiratory failure/septic shock from Pseudomonas Pneumonia -Self extubated post op on 3/15, but reintubated due to hypoxic respiratory failure and septic shock/pna/empyema -Improving, extubated on 3/22, on Ciprofloxacin 500 mg po BID for pna/empyema, blood culture no growth, pleural fluids from 3/16 +PSEUDOMONAS AERUGINOSA; Ciprofloxacin to D/C today -Currently on 2-3liter o2supplement at rest, his o2 sats dropped to 86% briefly with increased activity on 3/26 -continue wean o2 as tolerated -Plan is for Repeat CXR/Chest CT in 4 weeks to ensure resolution of Opacities  B/l pleural effusions, hydropneumothorax , bilateral pneumonia with pseudomonas empyema, dCHF- chronic, 2+ pedal edema, anasarca - S/p Left Thoracentesis done by IR  3/14 after platelet infusions , -s/p bilateral chest tube placed on 3/16 with TPA/DNAse ( not a candidate for VATs per pulmonology), cytology negative,  culture + psuedomonas, abx 11/14 as of 3/27, chest tubes removed on 3/25,  -patient continued to have congested cough, oxygen requirement, wbc seems trending up, Chest CT on 3/27 "Bilateral infiltrates right greater than left increasing particularly in the right lower lobe. Stable appearing bilateral pleural effusions with air pockets within." - Case was discussed with thoracic surgery Dr Roxy Manns over the phone by Dr. Erlinda Hong on 3/27, Dr Roxy Manns reviewed the chest CT who recommend continue aggressive pulmonary PT, abx, may consider bronchoscopy, nothing surgical currently, but if infection does not improve, he is at risk of developing empyema in the future in this immunocompromised patient" -consider broaden abx if wbc continue to trend up or if developing fever.  -C/w Lasix to  40 mg IV BD if bp and cr allows,  -Nutrition supplement as well. 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  -As Above  Insulin dependent Diabetes mellitus( prednisone induced? He was on prednisone for ITP) - A1c 6.6 - off steroids,  - CBG's ranging from 120-323 -continue adjust lantus/ssi  HLD  -continue Atovastatin 40 mg po  Abnormal thyroid functions -TSH was 5, free T4 1.39 - recommend these be rechecked in 3-4 wks  Deconditioning and Generalized Weakness -PT Recommending SNF  Hypomagnesemia -Patients Mag level was 1.5 -Replete with IV Mag Sulfate -Repeat Mag Level in AM  DVT prophylaxis: TED Hose, SCD's; Heparin D/C'd Code Status: FULL CODE Family Communication: Discussed with Patient's wife at Bedside Disposition Plan: SNF when medically stable  Consultants:   Cardiology (signed off on 3/26)  Oncology ( Oncology Dr Gudena/Dr Magrinat/Lindsey Holley Dexter, NP follow peripherally now (if any hematology issues arise p [phone].)  General Surgery (continue to follow daily,but oked with discharge)  Interventional Radiology  Pulmonology /critical care ( signed off on 3/26, reconsulted and evaluated patient 3/28)   Procedures:   Left Thoracentesis by IR on 3/14  s/p Transfusions of 4 pools of Platelets   s/p 4prbc transfusion  s/p Splenectomy on 3/15  intubation on 3/15, self extubation on 3/15, reintubation on 3/15, extubation on 3/22  Bilateral chest tube placement on 3/16, removed on 3/25  R picc line  placement on 3/17  Aline discontinued on 3/23  Antimicrobials: Anti-infectives    Start     Dose/Rate Route Frequency Ordered Stop   07/22/16 1400  ciprofloxacin (CIPRO) tablet 500 mg     500 mg Oral 2 times daily 07/22/16 1223 07/28/16 2359   07/16/16 1200  cefTAZidime (FORTAZ) 2 g in dextrose 5 % 50 mL IVPB  Status:  Discontinued     2 g 100 mL/hr over 30 Minutes Intravenous Every 8 hours 07/16/16 0942 07/22/16 1207   07/15/16 0600  vancomycin (VANCOCIN) 1,250 mg in sodium chloride 0.9 % 250 mL IVPB  Status:  Discontinued     1,250 mg 166.7 mL/hr over 90 Minutes Intravenous Every 12 hours 07/14/16 1901 07/16/16 0942   07/15/16 0200  piperacillin-tazobactam (ZOSYN) IVPB 3.375 g  Status:  Discontinued     3.375 g 12.5 mL/hr over 240 Minutes Intravenous Every 8 hours 07/14/16 1901 07/16/16 0942   07/14/16 1930  vancomycin (VANCOCIN) 1,500 mg in sodium chloride 0.9 % 500 mL IVPB     1,500 mg 250 mL/hr over 120 Minutes Intravenous  Once 07/14/16 1901 07/14/16 2251   07/14/16 1930  piperacillin-tazobactam (ZOSYN) IVPB 3.375 g     3.375 g 100 mL/hr over 30 Minutes Intravenous  Once 07/14/16 1901 07/14/16 2058     Subjective: Seen and examined at bedside and leg swelling was improved and thinks breathing has improved slightly. No CP. No nausea or vomiting.  Objective: Vitals:   07/28/16 0159 07/28/16 0626 07/28/16 0822 07/28/16 1416  BP: (!) 114/51 (!) 115/54    Pulse:  83    Resp:  18    Temp:  97.6 F (36.4 C)    TempSrc:  Oral    SpO2:  96% 97% 92%  Weight:      Height:        Intake/Output Summary (Last 24 hours) at 07/28/16 1726 Last data filed at 07/28/16 1232  Gross per 24 hour  Intake              490 ml  Output             2303 ml  Net            -1813 ml   Filed Weights   07/24/16 0600 07/25/16 0446 07/26/16 0513  Weight: 83.3 kg (183 lb 10.3 oz) 83.2 kg (183 lb 6.4 oz) 84.7 kg (186 lb 11.7 oz)   Examination: Physical Exam:  Constitutional: NAD and appears  calm and comfortable sitting bedside in chair Eyes: Lids and conjunctivae normal, sclerae anicteric  ENMT: External Ears, Nose appear normal. Grossly normal hearing.  Neck: Appears normal, supple, no cervical masses, normal ROM, no appreciable thyromegaly Respiratory: Diminished to auscultation bilaterally with some mild crackles. Normal respiratory effort and patient is not tachypenic. No accessory muscle use. Wearing Supplemental O2 via [place].  Cardiovascular: RRR, no murmurs / rubs / gallops. S1 and S2 auscultated. 1+ Lower Extremity Edema.  Abdomen: Soft, non-tender, non-distended. No masses palpated. No appreciable hepatosplenomegaly. Bowel sounds positive x4.  GU: Deferred. Musculoskeletal: No clubbing / cyanosis of digits/nails. No joint deformity upper and lower extremities.  Skin: No rashes, lesions, ulcers on limited skin evaluation. No induration; Warm and dry.  Neurologic: CN 2-12 grossly intact with no focal deficits. Romberg  sign cerebellar reflexes not assessed.  Psychiatric: Normal judgment and insight. Alert and oriented x 3. Normal mood and appropriate affect.   Data Reviewed: I have personally reviewed following labs and imaging studies  CBC:  Recent Labs Lab 07/24/16 0355 07/25/16 0428 07/26/16 0457 07/27/16 0423 07/28/16 0546  WBC 13.7* 15.2* 15.0* 12.5* 10.9*  NEUTROABS 10.9*  --   --  9.4* 8.2*  HGB 9.1* 8.7* 8.6* 8.4* 8.5*  HCT 29.8* 28.7* 28.0* 27.0* 27.3*  MCV 89.0 87.8 88.9 88.2 87.8  PLT 219 171 134* 67* 36*   Basic Metabolic Panel:  Recent Labs Lab 07/23/16 0500 07/24/16 0355 07/25/16 0428 07/26/16 0457 07/27/16 0423 07/28/16 0546  NA 142 140 136 136 139 139  K 3.9 4.0 4.4 4.5 3.8 3.7  CL 104 103 97* 95* 91* 92*  CO2 33* 32 35* 37* 40* 43*  GLUCOSE 193* 165* 199* 215* 176* 156*  BUN 36* 33* 29* 28* 18 13  CREATININE 0.80 0.81 0.69 0.68 0.58* 0.67  CALCIUM 8.7* 8.5* 8.6* 8.7* 8.6* 8.4*  MG 1.7 1.7  --   --  1.5* 1.6*  PHOS 2.5  --   --   --   2.0* 2.6   GFR: Estimated Creatinine Clearance: 86.3 mL/min (by C-G formula based on SCr of 0.67 mg/dL). Liver Function Tests:  Recent Labs Lab 07/22/16 0442 07/27/16 0423 07/28/16 0546  AST 13* 25 21  ALT 11* 23 20  ALKPHOS 55 111 101  BILITOT 0.9 0.4 0.7  PROT 5.3* 5.3* 5.4*  ALBUMIN 2.6* 2.0* 1.9*   No results for input(s): LIPASE, AMYLASE in the last 168 hours. No results for input(s): AMMONIA in the last 168 hours. Coagulation Profile: No results for input(s): INR, PROTIME in the last 168 hours. Cardiac Enzymes: No results for input(s): CKTOTAL, CKMB, CKMBINDEX, TROPONINI in the last 168 hours. BNP (last 3 results)  Recent Labs  02/17/16 0948 05/05/16 1450  PROBNP 40.0 17.0   HbA1C: No results for input(s): HGBA1C in the last 72 hours. CBG:  Recent Labs Lab 07/27/16 1152 07/27/16 1740 07/27/16 2208 07/28/16 0745 07/28/16 1203  GLUCAP 320* 125* 120* 161* 323*   Lipid Profile: No results for input(s): CHOL, HDL, LDLCALC, TRIG, CHOLHDL, LDLDIRECT in the last 72 hours. Thyroid Function Tests: No results for input(s): TSH, T4TOTAL, FREET4, T3FREE, THYROIDAB in the last 72 hours. Anemia Panel: No results for input(s): VITAMINB12, FOLATE, FERRITIN, TIBC, IRON, RETICCTPCT in the last 72 hours. Sepsis Labs:  Recent Labs Lab 07/21/16 1805 07/22/16 0442 07/23/16 0500  PROCALCITON 0.21 0.28 0.18    No results found for this or any previous visit (from the past 240 hour(s)).   Radiology Studies: Dg Chest Port 1 View  Result Date: 07/27/2016 CLINICAL DATA:  Pneumonia EXAM: PORTABLE CHEST 1 VIEW COMPARISON:  Chest x-ray of July 24 2016 and chest CT scan of July 25, 2016. FINDINGS: The lungs are mildly hypoinflated. Moderate-sized bilateral pleural effusions remain. There is some gas within the right pleural space and very early. Atelectasis or pneumonia at the right lung base is present with some atelectasis at the left lung base is well. The cardiac  silhouette is not enlarged. The pulmonary vascularity is mildly prominent. The right sided PICC line tip projects over the midportion of the SVC. IMPRESSION: The patient is positioned in a lordotic matter today which accentuates the hypoinflation. Persistent bilateral pleural effusions with bibasilar atelectasis or pneumonia. There has not been dramatic change since 24 July 2016. Electronically Signed  By: David  Martinique M.D.   On: 07/27/2016 07:24   Scheduled Meds: . atorvastatin  40 mg Per Tube q1800  . budesonide (PULMICORT) nebulizer solution  0.25 mg Nebulization BID  . carvedilol  3.125 mg Oral Once  . carvedilol  6.25 mg Oral BID WC  . ciprofloxacin  500 mg Oral BID  . furosemide  40 mg Intravenous BID  . guaiFENesin  600 mg Oral BID  . insulin aspart  0-20 Units Subcutaneous TID WC  . insulin glargine  8 Units Subcutaneous Daily  . ipratropium  0.5 mg Nebulization TID  . lactose free nutrition  237 mL Oral TID BM  . levalbuterol  1.25 mg Nebulization TID  . loratadine  10 mg Oral Daily  . magnesium oxide  200 mg Oral Daily  . mouth rinse  15 mL Mouth Rinse BID  . multivitamin with minerals  1 tablet Oral Daily  . polyethylene glycol  17 g Oral Daily  . QUEtiapine  25 mg Per Tube QHS  . senna-docusate  1 tablet Oral BID  . sodium chloride flush  10-40 mL Intracatheter Q12H  . sodium chloride flush  3 mL Intravenous Q12H   Continuous Infusions:   LOS: 19 days   Kerney Elbe, DO [facility] Pager [phone]  If 7PM-7AM, please contact night-coverage [email or web address] Password [facility] 07/28/2016, 5:26 PM

## 2016-07-28 NOTE — Progress Notes (Signed)
CSW assisting with d/c planning. [facility] contacted CSW to report Market researcher from [employer] reviewed pt's clinical's and found pt to be too acute for SNF placement at this time. Authorization has been denied for the time being. SNF will request [employer] review updated clinicals on Monday for authorization for placement. MD has been updated. CSW will continue to follow to assist with d/c planning.  Werner Lean LCSW [phone]

## 2016-07-28 NOTE — Progress Notes (Signed)
OT Cancellation Note  Patient Details Name: [name] MRN: [id number] DOB: 05/02/1940   Cancelled Treatment:    Reason Eval/Treat Not Completed: Fatigue/lethargy limiting ability to participate.  Will check later, if schedule permits.  Malyk Girouard 07/28/2016, 1:56 PM  Lesle Chris, OTR/L [phone] 07/28/2016

## 2016-07-28 NOTE — Progress Notes (Signed)
HEMATOLOGY-ONCOLOGY PROGRESS NOTE  SUBJECTIVE: Worsening thrombocytopenia Patient having increasing cough with inability to sleep along with shortness of breath at rest.  OBJECTIVE: REVIEW OF SYSTEMS:   Constitutional: Denies fevers, chills or abnormal weight loss Eyes: Denies blurriness of vision Ears, nose, mouth, throat, and face: Denies mucositis or sore throat Respiratory: Cough and shortness of breath Cardiovascular: Denies palpitation, chest discomfort Gastrointestinal:  Denies nausea, heartburn or change in bowel habits Neurological: Generalized weakness Behavioral/Psych: Mood is stable, no new changes  Extremities: Profound lower extremity edema All other systems were reviewed with the patient and are negative.  I have reviewed the past medical history, past surgical history, social history and family history with the patient and they are unchanged from previous note.   PHYSICAL EXAMINATION: ECOG PERFORMANCE STATUS: 1 - Symptomatic but completely ambulatory  Vitals:   07/28/16 0159 07/28/16 0626  BP: (!) 114/51 (!) 115/54  Pulse:  83  Resp:  18  Temp:  97.6 F (36.4 C)   Filed Weights   07/24/16 0600 07/25/16 0446 07/26/16 0513  Weight: 183 lb 10.3 oz (83.3 kg) 183 lb 6.4 oz (83.2 kg) 186 lb 11.7 oz (84.7 kg)    GENERAL:alert, no distress and comfortable SKIN: skin color, texture, turgor are normal, no rashes or significant lesions EYES: normal, Conjunctiva are pink and non-injected, sclera clear OROPHARYNX:no exudate, no erythema and lips, buccal mucosa, and tongue normal  LUNGS: Coarse breath sounds HEART: regular rate & rhythm and no murmurs and no lower extremity edema ABDOMEN:abdomen soft, non-tender and normal bowel sounds Musculoskeletal:no cyanosis of digits and no clubbing  NEURO: alert & oriented x 3 with fluent speech, no focal motor/sensory deficits  LABORATORY DATA:  I have reviewed the data as listed CMP Latest Ref Rng & Units 07/28/2016  07/27/2016 07/26/2016  Glucose 65 - 99 mg/dL 156(H) 176(H) 215(H)  BUN 6 - 20 mg/dL 13 18 28(H)  Creatinine 0.61 - 1.24 mg/dL 0.67 0.58(L) 0.68  Sodium 135 - 145 mmol/L 139 139 136  Potassium 3.5 - 5.1 mmol/L 3.7 3.8 4.5  Chloride 101 - 111 mmol/L 92(L) 91(L) 95(L)  CO2 22 - 32 mmol/L 43(H) 40(H) 37(H)  Calcium 8.9 - 10.3 mg/dL 8.4(L) 8.6(L) 8.7(L)  Total Protein 6.5 - 8.1 g/dL 5.4(L) 5.3(L) -  Total Bilirubin 0.3 - 1.2 mg/dL 0.7 0.4 -  Alkaline Phos 38 - 126 U/L 101 111 -  AST 15 - 41 U/L 21 25 -  ALT 17 - 63 U/L 20 23 -    Lab Results  Component Value Date   WBC 10.9 (H) 07/28/2016   HGB 8.5 (L) 07/28/2016   HCT 27.3 (L) 07/28/2016   MCV 87.8 07/28/2016   PLT 36 (L) 07/28/2016   NEUTROABS 8.2 (H) 07/28/2016    ASSESSMENT AND PLAN: 1. 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 The patient's platelet count may get worse before it gets better. However if the platelet counts drop below 10,000, we'll may have to administer thrombopoietin mimetics like Promacta. 2. because of this reason it is necessary to watch the patient in the hospital until the platelet count stabilize (not drop any further) Hematology will follow the patient for the weekend. From Monday onwards Dr. Jana Hakim will follow the patient.

## 2016-07-29 DIAGNOSIS — Z9081 Acquired absence of spleen: Secondary | ICD-10-CM

## 2016-07-29 LAB — CBC WITH DIFFERENTIAL/PLATELET
BASOS ABS: 0 10*3/uL (ref 0.0–0.1)
Basophils Relative: 0 %
EOS PCT: 1 %
Eosinophils Absolute: 0.1 10*3/uL (ref 0.0–0.7)
HEMATOCRIT: 27.5 % — AB (ref 39.0–52.0)
Hemoglobin: 8.7 g/dL — ABNORMAL LOW (ref 13.0–17.0)
Lymphocytes Relative: 15 %
Lymphs Abs: 1.8 10*3/uL (ref 0.7–4.0)
MCH: 27.8 pg (ref 26.0–34.0)
MCHC: 31.6 g/dL (ref 30.0–36.0)
MCV: 87.9 fL (ref 78.0–100.0)
MONOS PCT: 9 %
Monocytes Absolute: 1.1 10*3/uL — ABNORMAL HIGH (ref 0.1–1.0)
NEUTROS PCT: 75 %
Neutro Abs: 9.1 10*3/uL — ABNORMAL HIGH (ref 1.7–7.7)
PLATELETS: 17 10*3/uL — AB (ref 150–400)
RBC: 3.13 MIL/uL — AB (ref 4.22–5.81)
RDW: 16.3 % — ABNORMAL HIGH (ref 11.5–15.5)
WBC: 12.1 10*3/uL — AB (ref 4.0–10.5)

## 2016-07-29 LAB — BASIC METABOLIC PANEL
BUN: 11 mg/dL (ref 4–21)
Creatinine: 0.6 mg/dL (ref 0.6–1.3)
Glucose: 179 mg/dL
Potassium: 3.5 mmol/L (ref 3.4–5.3)
SODIUM: 138 mmol/L (ref 137–147)

## 2016-07-29 LAB — COMPREHENSIVE METABOLIC PANEL
ALT: 20 U/L (ref 17–63)
AST: 23 U/L (ref 15–41)
Albumin: 1.9 g/dL — ABNORMAL LOW (ref 3.5–5.0)
Alkaline Phosphatase: 99 U/L (ref 38–126)
Anion gap: 7 (ref 5–15)
BILIRUBIN TOTAL: 0.5 mg/dL (ref 0.3–1.2)
BUN: 11 mg/dL (ref 6–20)
CHLORIDE: 88 mmol/L — AB (ref 101–111)
CO2: 43 mmol/L — ABNORMAL HIGH (ref 22–32)
Calcium: 8.2 mg/dL — ABNORMAL LOW (ref 8.9–10.3)
Creatinine, Ser: 0.61 mg/dL (ref 0.61–1.24)
GFR calc Af Amer: >60 mL/min (ref >60)
GLUCOSE: 179 mg/dL — AB (ref 65–99)
Potassium: 3.5 mmol/L (ref 3.5–5.1)
Sodium: 138 mmol/L (ref 135–145)
Total Protein: 5.2 g/dL — ABNORMAL LOW (ref 6.5–8.1)

## 2016-07-29 LAB — CBC AND DIFFERENTIAL
HEMATOCRIT: 28 % — AB (ref 41–53)
Hemoglobin: 8.7 g/dL — AB (ref 13.5–17.5)
PLATELETS: 17 10*3/uL — AB (ref 150–399)
WBC: 12.1 10^3/mL

## 2016-07-29 LAB — MAGNESIUM: Magnesium: 1.7 mg/dL (ref 1.7–2.4)

## 2016-07-29 LAB — HEPATIC FUNCTION PANEL
ALK PHOS: 99 U/L (ref 25–125)
ALT: 20 U/L (ref 10–40)
AST: 23 U/L (ref 14–40)
BILIRUBIN, TOTAL: 0.5 mg/dL

## 2016-07-29 LAB — GLUCOSE, CAPILLARY
GLUCOSE-CAPILLARY: 174 mg/dL — AB (ref 65–99)
Glucose-Capillary: 180 mg/dL — ABNORMAL HIGH (ref 65–99)
Glucose-Capillary: 227 mg/dL — ABNORMAL HIGH (ref 65–99)
Glucose-Capillary: 150 mg/dL — ABNORMAL HIGH (ref 65–99)
Glucose-Capillary: 220 mg/dL — ABNORMAL HIGH (ref 65–99)

## 2016-07-29 LAB — PHOSPHORUS: PHOSPHORUS: 2.4 mg/dL — AB (ref 2.5–4.6)

## 2016-07-29 MED ORDER — FUROSEMIDE 10 MG/ML IJ SOLN
40.0000 mg | Freq: Every day | INTRAMUSCULAR | Status: DC
  Administered 2016-07-30 – 2016-08-02 (×4): 40 mg via INTRAVENOUS
  Filled 2016-07-29 (×4): qty 4

## 2016-07-29 MED ORDER — DIPHENHYDRAMINE HCL 50 MG PO CAPS
50.0000 mg | ORAL_CAPSULE | Freq: Every evening | ORAL | Status: DC | PRN
  Administered 2016-07-29: 50 mg via ORAL
  Filled 2016-07-29: qty 1

## 2016-07-29 MED ORDER — AMINOCAPROIC ACID 500 MG PO TABS
1.0000 g | ORAL_TABLET | Freq: Three times a day (TID) | ORAL | Status: DC
  Administered 2016-07-29 – 2016-08-02 (×12): 1 g via ORAL
  Filled 2016-07-29 (×15): qty 2

## 2016-07-29 MED ORDER — AMINOCAPROIC ACID 500 MG PO TABS
2.0000 g | ORAL_TABLET | Freq: Three times a day (TID) | ORAL | Status: DC
  Administered 2016-07-29: 1 g via ORAL
  Filled 2016-07-29: qty 4

## 2016-07-29 MED ORDER — AMINOCAPROIC ACID 500 MG PO TABS: 1.0000 g | ORAL_TABLET | Freq: Three times a day (TID) | ORAL | Status: DC

## 2016-07-29 NOTE — Progress Notes (Signed)
IP PROGRESS NOTE  Subjective:   No bleeding. He continues to have dyspnea. He was placed on heparin 07/19/2016. Heparin was discontinued 07/27/2016 secondary to a falling platelet count and concern for HIT.  Objective: Vital signs in last 24 hours: Blood pressure (!) 102/49, pulse 78, temperature 98.3 F (36.8 C), temperature source Oral, resp. rate 18, height 5\' 11"  (1.803 m), weight 182 lb 15.7 oz (83 kg), SpO2 96 %.  Intake/Output from previous day: 03/30 0701 - 03/31 0700 In: 10 [I.V.:10] Out: 1828 [Urine:1825; Stool:3]  Physical Exam:  HEENT: No thrush or bleeding Lungs: Decreased breath sounds at the right lower chest, inspiratory rhonchi at the left posterior base, increased respiratory rate Cardiac: Regular rate and rhythm, 2/6 systolic murmur Abdomen: No hepatomegaly Extremities: Trace low leg/ankle edema bilaterally Skin: Small ecchymosis at the left upper arm, no petechiae  Portacath/PICC-without erythema  Lab Results:  Recent Labs  07/28/16 0546 07/29/16 0418  WBC 10.9* 12.1*  HGB 8.5* 8.7*  HCT 27.3* 27.5*  PLT 36* 17*    BMET  Recent Labs  07/28/16 0546 07/29/16 0418  NA 139 138  K 3.7 3.5  CL 92* 88*  CO2 43* 43*  GLUCOSE 156* 179*  BUN 13 11  CREATININE 0.67 0.61  CALCIUM 8.4* 8.2*    Studies/Results: No results found.  Medications: I have reviewed the patient's current medications.  Assessment/Plan: 1. Refractory ITP, status post splenectomy 07/13/2016 2. Respiratory failure secondary to COPD and empyema 3. SVT/paroxysmal atrial fibrillation 4. Pneumonia/Septic shock secondary to pseudomonas aeruginosa 5. Diabetes  Mr. Uecker has a history of refractory ITP. He has felt multiple systemic therapies for ITP and was taken to a splenectomy procedure 07/13/2016. I suspect the current thrombocytopenia is related to failure of the splenectomy, though there may be a component related to infection. I have a low clinical suspicion for HIT  given the time course of the thrombocytopenia and negative HIT antibody study. Treatment options for the thrombocytopenia and present include continued observation as he recovers from the recent infection versus starting a trial of thrombopoietin therapy or pulse Decadron.  I favor following him off of therapy today. At the platelet count falls tomorrow I will begin nplate therapy.    LOS: 20 days   Betsy Coder, MD   07/29/2016, 8:42 AM

## 2016-07-29 NOTE — Progress Notes (Signed)
PROGRESS NOTE    [name]  HEN:[id number] DOB: [birth year] DOA: 07/08/2016 PCP: [name], MD  Brief Narrative:  [name] a 76 y.o.malewith medical history significant of hypertension, hyperlipidemia, diabetes mellitus (possible steroid induced), COPD  (home o2 3liters started in 06/2016), bronchiectasis, GERD, idiopathic thrombocytopenia, dCHF, bilateral pleural effusion,PSVT who presents with shortness of breath and bilateral leg edema.  He was recently hospitalized from 2/10 to 2/20 for pleural effusion( had thoracentesis and diuresed), SVT, ITP, he Returns to the ED on 3/11 for HR >150 noted while he was checking Vitals at home. ER performed electrical cardioverstion due to concomitant complaint of dyspnea and hypotension, he was started on Cardizem and amiodarone infusion and admitted to SDU under hospitalist service.  Had severe refractory ITP so Oncology consulted and recommended Splenectomy so General Surgery consulted patient underwent splenectomy on 3/15. Patient is s/p Left Thoracentesis 3/14, culture+ pseudomonas. He was  transferred to ICU intubated post op. He self extubated on 3/15, had to be reintubated due to respiratory failure. Extubated on 3/22  He has bilatearal chest tube placed on 3/16, he received TPA/DNAse due to loculated pleural effusion, chest tube removed on 3/25. He is transferred back to hospitalist service on 3/25  Repeat ct chest  On 3/27 showed continued consolidation;  Continued lasix if bp and cr allows, nutrition supplement as well. PCCM reconsulted for additional recc's  Still not bringing up much phlegm and has significant leg swelling so IV Lasix was doubled. Pulmonary cleared patient to go to SNF however his platelet count started dropping so Heparin was stopped because of concern for HIT. Oncology reviewed course and do not feel as if it is HIT but may give IV Decadron and nplate therapy tomorrow. Patient slowly feeling better  however started having nose bleeding so Amnicar was restarted.    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 Complicated further by baseline COPD and Bronchiectasis    Disorders of fluid, electrolyte, and acid-base balance   Anemia in acute on chronic illness   Severe protein-calorie malnutrition (HCC)   Anasarca   Severe muscle deconditioning   Septic shock (HCC)   Acute encephalopathy   Arterial hypotension   Pneumonia   Pleural effusion, bilateral  SVT At home and in ER (presenting symptom) Paroxysmal A-fib with RVR subsequently - s/p electrical cardioversion in ER following by Cardizem (stopped on 3/17)/amiodarone infusion (stopped on 3/21), echo with lvef 60-65%, grade II diastolic dysfunction - now on coreg, CHADSVASC score 4( age, htn,dm2, diastolic chf), per cardiology Dr Ellyn Hack " suspect that the arrhythmia was related to his critical illness. May warrant OP monitor to determine +/- true Dx of Afib. Will ask EP to review EKGs, but for now, esp. In light of his recent acute illness, I would opt for NOT STARTING DOAC.""increasing BB dose as BP tolerates. " - Cardiology signed off, he needs outpatient cardiology follow up.  ITP and now Concern for HIT r/o'd out - failed multiple line of medical therapy,  plt count 5 on admission, nose bleed (was on around clock amicar), blood loss anemia -> Home Amnicar started because patient had blood nose again - s/p Transfusions of 4 pools  of Platelets s/p 4prbc transfusion, s/p Splenectomy on 3/15 -plt had normalized sicne 3/22 but started worsening 3/27 and now Platelets  decreased to 17 -Sent HIT Panel and D/C''d Heparin Products; Heparin Induced Platelet Ab was 0.245 and WNL and after discussion with Dr. Benay Spice in Oncology do not feel as if patient has hit and it is more 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  -Possible nPlate therapy to be initiated by Oncology -Surgery continue to follow and post splenectomy vaccinations per general surgery.  -Repeat CBC in AM  Hypoxic respiratory failure/septic shock from Pseudomonas Pneumonia -Self extubated post op on 3/15, but reintubated due to hypoxic respiratory failure and septic shock/pna/empyema -Improving, extubated on 3/22, on Ciprofloxacin 500 mg po BID for pna/empyema, blood culture no growth, pleural fluids from 3/16 +PSEUDOMONAS AERUGINOSA; Ciprofloxacin to D/C 3/30 -Currently on 2-3liter o2supplement at rest, his o2 sats dropped to 86% briefly with increased activity on 3/26 -continue wean o2 as tolerated -Plan is for Repeat CXR/Chest CT in 4 weeks to ensure resolution of Opacities  B/l pleural effusions, hydropneumothorax , bilateral pneumonia with pseudomonas empyema, Acute on Chronic CHF - S/p Left Thoracentesis done by IR  3/14 after platelet infusions , -s/p bilateral chest tube placed on 3/16 with TPA/DNAse ( not a candidate for VATs per pulmonology), cytology negative,  culture + psuedomonas, abx 11/14 as of 3/27, chest tubes removed on 3/25,  -patient continued to have congested cough, oxygen requirement, wbc seems trending up, Chest CT on 3/27 "Bilateral infiltrates right greater than left increasing particularly in the right lower lobe. Stable appearing bilateral pleural effusions with air pockets within." - Case was discussed with thoracic surgery Dr Roxy Manns over the phone by Dr. Erlinda Hong on 3/27, Dr Roxy Manns reviewed the chest CT who recommend continue aggressive pulmonary PT, abx, may consider bronchoscopy, nothing surgical currently, but if infection does not improve, he is at risk of developing empyema in the future in  this immunocompromised patient" -consider broaden abx if wbc continue to trend up or if developing fever.  -Changed IV Lasix 40 mg IV BID to IV Daily if bp and cr allows, Patient has diuresed 16.2 Liters.  -Nutrition supplement as well. 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  -As Above  Insulin dependent Diabetes mellitus( prednisone induced? He was on prednisone for ITP) - A1c 6.6 - off steroids,  - CBG's ranging from 120-323 -continue adjust lantus/ssi  HLD  -continue Atovastatin 40 mg po  Abnormal thyroid functions -TSH was 5, free T4 1.39 - recommend these be rechecked in 3-4 wks  Deconditioning and Generalized Weakness -PT Recommending SNF  Hypomagnesemia -Patients Mag level was 1.7 -Replete with IV Mag Sulfate -Repeat Mag Level in AM  DVT prophylaxis: TED Hose, SCD's; Heparin D/C'd Code Status: FULL CODE Family Communication: Discussed with Patient's wife at Bedside Disposition Plan: SNF when medically stable  Consultants:   Cardiology (signed off on 3/26)  Oncology ( Oncology Dr Gudena/Dr Magrinat/Dr. Janine Ores, NP follow peripherally now (if any hematology issues arise p [phone].)  General Surgery (continue to follow daily,but oked with discharge)  Interventional Radiology  Pulmonology /critical care ( signed off  on 3/26, reconsulted and evaluated patient 3/28)   Procedures:   Left Thoracentesis by IR on 3/14  s/p Transfusions of 4 pools of Platelets   s/p 4prbc transfusion  s/p Splenectomy on  3/15  intubation on 3/15, self extubation on 3/15, reintubation on 3/15, extubation on 3/22  Bilateral chest tube placement on 3/16, removed on 3/25  R picc line placement on 3/17  Aline discontinued on 3/23  Antimicrobials: Anti-infectives    Start     Dose/Rate Route Frequency Ordered Stop   07/22/16 1400  ciprofloxacin (CIPRO) tablet 500 mg     500 mg Oral 2 times daily 07/22/16 1223 07/28/16 2029   07/16/16 1200  cefTAZidime (FORTAZ) 2 g in dextrose 5 % 50 mL IVPB  Status:  Discontinued     2 g 100 mL/hr over 30 Minutes Intravenous Every 8 hours 07/16/16 0942 07/22/16 1207   07/15/16 0600  vancomycin (VANCOCIN) 1,250 mg in sodium chloride 0.9 % 250 mL IVPB  Status:  Discontinued     1,250 mg 166.7 mL/hr over 90 Minutes Intravenous Every 12 hours 07/14/16 1901 07/16/16 0942   07/15/16 0200  piperacillin-tazobactam (ZOSYN) IVPB 3.375 g  Status:  Discontinued     3.375 g 12.5 mL/hr over 240 Minutes Intravenous Every 8 hours 07/14/16 1901 07/16/16 0942   07/14/16 1930  vancomycin (VANCOCIN) 1,500 mg in sodium chloride 0.9 % 500 mL IVPB     1,500 mg 250 mL/hr over 120 Minutes Intravenous  Once 07/14/16 1901 07/14/16 2251   07/14/16 1930  piperacillin-tazobactam (ZOSYN) IVPB 3.375 g     3.375 g 100 mL/hr over 30 Minutes Intravenous  Once 07/14/16 1901 07/14/16 2058     Subjective: Seen and examined at bedside and feels like legs have improved significantly. Was worried about platelet count dropping and later in the day had some blood when he blew his nose so Amicar was restarted. No other concerns or complaints at this time.   Objective: Vitals:   07/28/16 2047 07/29/16 0544 07/29/16 0845 07/29/16 1330  BP:  (!) 102/49 (!) 109/54 (!) 110/59  Pulse:  78 100 79  Resp:  18  18  Temp:  98.3 F (36.8 C)  98.5 F (36.9 C)  TempSrc:  Oral  Oral  SpO2: 93% 96%  98%  Weight:  83 kg (182 lb 15.7 oz)    Height:        Intake/Output Summary (Last 24 hours) at 07/29/16 1950 Last  data filed at 07/29/16 1900  Gross per 24 hour  Intake              720 ml  Output             3375 ml  Net            -2655 ml   Filed Weights   07/25/16 0446 07/26/16 0513 07/29/16 0544  Weight: 83.2 kg (183 lb 6.4 oz) 84.7 kg (186 lb 11.7 oz) 83 kg (182 lb 15.7 oz)   Examination: Physical Exam:  Constitutional: NAD and appears calm and comfortable sitting bedside in chair Eyes: Lids and conjunctivae normal, sclerae anicteric  ENMT: External Ears, Nose appear normal. Grossly normal hearing.  Neck: Appears normal, supple, no cervical masses, normal ROM, no appreciable thyromegaly Respiratory: Diminished to auscultation bilaterally with some mild crackles. Normal respiratory effort and patient is not tachypenic. No accessory muscle use. Wearing Supplemental O2 via .  Cardiovascular: RRR, no murmurs / rubs / gallops. S1 and S2  auscultated. Trace Lower Extremity Edema. Wearing compression stockings  Abdomen: Soft, non-tender, non-distended. No masses palpated. No appreciable hepatosplenomegaly. Bowel sounds positive x4.  GU: Deferred. Musculoskeletal: No clubbing / cyanosis of digits/nails. No joint deformity upper and lower extremities.  Skin: No rashes, lesions, ulcers on limited skin evaluation. No induration; Warm and dry.  Neurologic: CN 2-12 grossly intact with no focal deficits. Romberg sign cerebellar reflexes not assessed.  Psychiatric: Normal judgment and insight. Alert and oriented x 3. Normal mood and appropriate affect.   Data Reviewed: I have personally reviewed following labs and imaging studies  CBC:  Recent Labs Lab 07/24/16 0355 07/25/16 0428 07/26/16 0457 07/27/16 0423 07/28/16 0546 07/29/16 0418  WBC 13.7* 15.2* 15.0* 12.5* 10.9* 12.1*  NEUTROABS 10.9*  --   --  9.4* 8.2* 9.1*  HGB 9.1* 8.7* 8.6* 8.4* 8.5* 8.7*  HCT 29.8* 28.7* 28.0* 27.0* 27.3* 27.5*  MCV 89.0 87.8 88.9 88.2 87.8 87.9  PLT 219 171 134* 67* 36* 17*   Basic Metabolic Panel:  Recent  Labs Lab 07/23/16 0500 07/24/16 0355 07/25/16 0428 07/26/16 0457 07/27/16 0423 07/28/16 0546 07/29/16 0418  NA 142 140 136 136 139 139 138  K 3.9 4.0 4.4 4.5 3.8 3.7 3.5  CL 104 103 97* 95* 91* 92* 88*  CO2 33* 32 35* 37* 40* 43* 43*  GLUCOSE 193* 165* 199* 215* 176* 156* 179*  BUN 36* 33* 29* 28* 18 13 11   CREATININE 0.80 0.81 0.69 0.68 0.58* 0.67 0.61  CALCIUM 8.7* 8.5* 8.6* 8.7* 8.6* 8.4* 8.2*  MG 1.7 1.7  --   --  1.5* 1.6* 1.7  PHOS 2.5  --   --   --  2.0* 2.6 2.4*   GFR: Estimated Creatinine Clearance: 86.3 mL/min (by C-G formula based on SCr of 0.61 mg/dL). Liver Function Tests:  Recent Labs Lab 07/27/16 0423 07/28/16 0546 07/29/16 0418  AST 25 21 23   ALT 23 20 20   ALKPHOS 111 101 99  BILITOT 0.4 0.7 0.5  PROT 5.3* 5.4* 5.2*  ALBUMIN 2.0* 1.9* 1.9*   No results for input(s): LIPASE, AMYLASE in the last 168 hours. No results for input(s): AMMONIA in the last 168 hours. Coagulation Profile: No results for input(s): INR, PROTIME in the last 168 hours. Cardiac Enzymes: No results for input(s): CKTOTAL, CKMB, CKMBINDEX, TROPONINI in the last 168 hours. BNP (last 3 results)  Recent Labs  02/17/16 0948 05/05/16 1450  PROBNP 40.0 17.0   HbA1C: No results for input(s): HGBA1C in the last 72 hours. CBG:  Recent Labs Lab 07/28/16 1735 07/28/16 2219 07/29/16 0736 07/29/16 1127 07/29/16 1757  GLUCAP 168* 180* 174* 227* 150*   Lipid Profile: No results for input(s): CHOL, HDL, LDLCALC, TRIG, CHOLHDL, LDLDIRECT in the last 72 hours. Thyroid Function Tests: No results for input(s): TSH, T4TOTAL, FREET4, T3FREE, THYROIDAB in the last 72 hours. Anemia Panel: No results for input(s): VITAMINB12, FOLATE, FERRITIN, TIBC, IRON, RETICCTPCT in the last 72 hours. Sepsis Labs:  Recent Labs Lab 07/23/16 0500  PROCALCITON 0.18    No results found for this or any previous visit (from the past 240 hour(s)).   Radiology Studies: No results found. Scheduled  Meds: . aminocaproic acid  1 g Oral Q8H  . atorvastatin  40 mg Per Tube q1800  . budesonide (PULMICORT) nebulizer solution  0.25 mg Nebulization BID  . carvedilol  3.125 mg Oral Once  . carvedilol  6.25 mg Oral BID WC  . furosemide  40 mg Intravenous BID  .  guaiFENesin  600 mg Oral BID  . insulin aspart  0-20 Units Subcutaneous TID WC  . insulin glargine  8 Units Subcutaneous Daily  . ipratropium  0.5 mg Nebulization TID  . lactose free nutrition  237 mL Oral TID BM  . levalbuterol  1.25 mg Nebulization TID  . loratadine  10 mg Oral Daily  . magnesium oxide  200 mg Oral Daily  . mouth rinse  15 mL Mouth Rinse BID  . multivitamin with minerals  1 tablet Oral Daily  . polyethylene glycol  17 g Oral Daily  . senna-docusate  1 tablet Oral BID  . sodium chloride flush  10-40 mL Intracatheter Q12H  . sodium chloride flush  3 mL Intravenous Q12H   Continuous Infusions:   LOS: 20 days   Kerney Elbe, DO [facility] Pager [phone]  If 7PM-7AM, please contact night-coverage [email or web address] Password [email or web address] 07/29/2016, 7:50 PM

## 2016-07-30 ENCOUNTER — Inpatient Hospital Stay (HOSPITAL_COMMUNITY): Payer: Medicare HMO

## 2016-07-30 DIAGNOSIS — R29898 Other symptoms and signs involving the musculoskeletal system: Secondary | ICD-10-CM

## 2016-07-30 LAB — COMPREHENSIVE METABOLIC PANEL
ALBUMIN: 2 g/dL — AB (ref 3.5–5.0)
ALT: 18 U/L (ref 17–63)
ANION GAP: 7 (ref 5–15)
AST: 18 U/L (ref 15–41)
Alkaline Phosphatase: 97 U/L (ref 38–126)
BILIRUBIN TOTAL: 0.4 mg/dL (ref 0.3–1.2)
BUN: 10 mg/dL (ref 6–20)
CO2: 45 mmol/L — ABNORMAL HIGH (ref 22–32)
Calcium: 8.3 mg/dL — ABNORMAL LOW (ref 8.9–10.3)
Chloride: 87 mmol/L — ABNORMAL LOW (ref 101–111)
Creatinine, Ser: 0.51 mg/dL — ABNORMAL LOW (ref 0.61–1.24)
GFR calc Af Amer: >60 mL/min (ref >60)
Glucose, Bld: 181 mg/dL — ABNORMAL HIGH (ref 65–99)
POTASSIUM: 3.2 mmol/L — AB (ref 3.5–5.1)
Sodium: 139 mmol/L (ref 135–145)
TOTAL PROTEIN: 5.3 g/dL — AB (ref 6.5–8.1)

## 2016-07-30 LAB — HEPATIC FUNCTION PANEL
ALT: 18 U/L (ref 10–40)
AST: 18 U/L (ref 14–40)
Alkaline Phosphatase: 97 U/L (ref 25–125)
Bilirubin, Total: 0.4 mg/dL

## 2016-07-30 LAB — CBC WITH DIFFERENTIAL/PLATELET
BASOS PCT: 0 %
Basophils Absolute: 0 10*3/uL (ref 0.0–0.1)
EOS PCT: 1 %
Eosinophils Absolute: 0.1 10*3/uL (ref 0.0–0.7)
HEMATOCRIT: 28.7 % — AB (ref 39.0–52.0)
Hemoglobin: 8.9 g/dL — ABNORMAL LOW (ref 13.0–17.0)
Lymphocytes Relative: 16 %
Lymphs Abs: 1.8 10*3/uL (ref 0.7–4.0)
MCH: 27.2 pg (ref 26.0–34.0)
MCHC: 31 g/dL (ref 30.0–36.0)
MCV: 87.8 fL (ref 78.0–100.0)
MONO ABS: 1.1 10*3/uL — AB (ref 0.1–1.0)
MONOS PCT: 9 %
NEUTROS ABS: 8.6 10*3/uL — AB (ref 1.7–7.7)
Neutrophils Relative %: 74 %
Platelets: 14 10*3/uL — CL (ref 150–400)
RBC: 3.27 MIL/uL — ABNORMAL LOW (ref 4.22–5.81)
RDW: 16.2 % — ABNORMAL HIGH (ref 11.5–15.5)
WBC: 11.6 10*3/uL — AB (ref 4.0–10.5)

## 2016-07-30 LAB — GLUCOSE, CAPILLARY
GLUCOSE-CAPILLARY: 166 mg/dL — AB (ref 65–99)
GLUCOSE-CAPILLARY: 216 mg/dL — AB (ref 65–99)
Glucose-Capillary: 336 mg/dL — ABNORMAL HIGH (ref 65–99)
Glucose-Capillary: 245 mg/dL — ABNORMAL HIGH (ref 65–99)

## 2016-07-30 LAB — BASIC METABOLIC PANEL
BUN: 10 mg/dL (ref 4–21)
CREATININE: 0.5 mg/dL — AB (ref 0.6–1.3)
Glucose: 181 mg/dL
POTASSIUM: 3.2 mmol/L — AB (ref 3.4–5.3)
SODIUM: 139 mmol/L (ref 137–147)

## 2016-07-30 LAB — CBC AND DIFFERENTIAL
HCT: 29 % — AB (ref 41–53)
HEMOGLOBIN: 8.9 g/dL — AB (ref 13.5–17.5)
Platelets: 14 10*3/uL — AB (ref 150–399)
WBC: 11.6 10^3/mL

## 2016-07-30 LAB — PHOSPHORUS: Phosphorus: 2.9 mg/dL (ref 2.5–4.6)

## 2016-07-30 LAB — MAGNESIUM: MAGNESIUM: 1.6 mg/dL — AB (ref 1.7–2.4)

## 2016-07-30 MED ORDER — MAGNESIUM SULFATE 2 GM/50ML IV SOLN
2.0000 g | Freq: Once | INTRAVENOUS | Status: AC
  Administered 2016-07-30: 2 g via INTRAVENOUS
  Filled 2016-07-30: qty 50

## 2016-07-30 MED ORDER — ROMIPLOSTIM 250 MCG ~~LOC~~ SOLR
1.0000 ug/kg | SUBCUTANEOUS | Status: DC
  Administered 2016-07-30: 85 ug via SUBCUTANEOUS
  Filled 2016-07-30: qty 0.17

## 2016-07-30 MED ORDER — POTASSIUM CHLORIDE CRYS ER 20 MEQ PO TBCR
40.0000 meq | EXTENDED_RELEASE_TABLET | Freq: Two times a day (BID) | ORAL | Status: DC
  Administered 2016-07-30: 40 meq via ORAL
  Filled 2016-07-30: qty 2

## 2016-07-30 MED ORDER — POTASSIUM CHLORIDE 20 MEQ/15ML (10%) PO SOLN
40.0000 meq | Freq: Four times a day (QID) | ORAL | Status: AC
  Administered 2016-07-30 (×2): 40 meq via ORAL
  Filled 2016-07-30 (×2): qty 30

## 2016-07-30 MED ORDER — POTASSIUM CHLORIDE CRYS ER 20 MEQ PO TBCR: 40.0000 meq | EXTENDED_RELEASE_TABLET | Freq: Two times a day (BID) | ORAL | Status: DC

## 2016-07-30 NOTE — Progress Notes (Signed)
PROGRESS NOTE    [name]  CXK:[id number] DOB: 24-Jan-1941 DOA: 07/08/2016 PCP: [name], MD  Brief Narrative:  [name] a 76 y.o.malewith medical history significant of hypertension, hyperlipidemia, diabetes mellitus (possible steroid induced), COPD  (home o2 3liters started in 06/2016), bronchiectasis, GERD, idiopathic thrombocytopenia, dCHF, bilateral pleural effusion,PSVT who presents with shortness of breath and bilateral leg edema.  He was recently hospitalized from 2/10 to 2/20 for pleural effusion( had thoracentesis and diuresed), SVT, ITP, he Returns to the ED on 3/11 for HR >150 noted while he was checking Vitals at home. ER performed electrical cardioverstion due to concomitant complaint of dyspnea and hypotension, he was started on Cardizem and amiodarone infusion and admitted to SDU under hospitalist service.  Had severe refractory ITP so Oncology consulted and recommended Splenectomy so General Surgery consulted patient underwent splenectomy on 3/15. Patient is s/p Left Thoracentesis 3/14, culture+ pseudomonas. He was  transferred to ICU intubated post op. He self extubated on 3/15, had to be reintubated due to respiratory failure. Extubated on 3/22  He has bilatearal chest tube placed on 3/16, he received TPA/DNAse due to loculated pleural effusion, chest tube removed on 3/25. He is transferred back to hospitalist service on 3/25  Repeat ct chest  On 3/27 showed continued consolidation;  Continued lasix if bp and cr allows, nutrition supplement as well. PCCM reconsulted for additional recc's  Still not bringing up much phlegm and has significant leg swelling so IV Lasix was doubled with improvement and now currently reduced to 1x day dosing. Pulmonary cleared patient to go to SNF however his platelet count started dropping so Heparin was stopped because of concern for HIT. Oncology reviewed course and do not feel as if it is HIT and nplate therapy started  today. Patient slowly feeling better however started having nose bleeding yesterday so Amnicar was restarted.    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 Complicated further by baseline COPD and Bronchiectasis    Disorders of fluid, electrolyte, and acid-base balance   Anemia in acute on chronic illness   Severe protein-calorie malnutrition (HCC)   Anasarca   Severe muscle deconditioning   Septic shock (HCC)   Acute encephalopathy   Arterial hypotension   Pneumonia   Pleural effusion, bilateral  ITP and now Concern for HIT r/o'd out - failed multiple line of medical therapy,  plt count 5 on admission, nose bleed (was on around clock amicar), blood loss anemia -> Home Amnicar restarted 07/29/16 because patient had blood nose again - s/p Transfusions of 4 pools of Platelets s/p 4prbc transfusion, s/p Splenectomy on 3/15 -plt had normalized sicne 3/22 but started worsening 3/27 and now Platelets decreased to 14 -Sent HIT Panel and D/C''d Heparin Products; Heparin Induced Platelet Ab was 0.245 and WNL and after discussion with Dr. Benay Spice in Oncology do not feel as if patient has hit and it is more 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  -Discussed with Dr. Benay Spice in Oncology and nPlate therapy to be initiated today and patient received dose of 85 mcg sq (weekly)  -Surgery continue to follow and post splenectomy vaccinations per general surgery.  -Repeat CBC in AM  Hypoxic respiratory failure/septic  shock from Pseudomonas Pneumonia -Self extubated post op on 3/15, but reintubated due to hypoxic respiratory failure and septic  shock/pna/empyema -Improving, extubated on 3/22, on Ciprofloxacin 500 mg po BID for pna/empyema, blood culture no growth, pleural fluids from 3/16 +PSEUDOMONAS AERUGINOSA; Ciprofloxacin to D/C 3/30 -Currently on 2-3liter o2supplement at rest, his o2 sats dropped to 86% briefly with increased activity on 3/26 -continue wean o2 as tolerated -Plan is for Repeat CXR/Chest CT in 4 weeks to ensure resolution of Opacities  B/l pleural effusions, hydropneumothorax , bilateral pneumonia with pseudomonas empyema, Acute on Chronic CHF - S/p Left Thoracentesis done by IR  3/14 after platelet infusions , -s/p bilateral chest tube placed on 3/16 with TPA/DNAse ( not a candidate for VATs per pulmonology), cytology negative,  culture + psuedomonas, abx 11/14 as of 3/27, chest tubes removed on 3/25,  -patient continued to have congested cough, oxygen requirement, wbc seems trending up, Chest CT on 3/27 "Bilateral infiltrates right greater than left increasing particularly in the right lower lobe. Stable appearing bilateral pleural effusions with air pockets within." - Case was discussed with thoracic surgery Dr Roxy Manns over the phone by Dr. Erlinda Hong on 3/27, Dr Roxy Manns reviewed the chest CT who recommend continue aggressive pulmonary PT, abx, may consider bronchoscopy, nothing surgical currently, but if infection does not improve, he is at risk of developing empyema in the future in this immunocompromised patient" -consider broaden abx if wbc continue to trend up or if developing fever.  -C/w Lasix IV Daily if bp and cr allows, Patient has diuresed 16.3 Liters.  -Nutrition supplement as well. 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  -As Above  SVT At home and in ER (presenting symptom) Paroxysmal A-fib with RVR subsequently - s/p electrical cardioversion in ER following by Cardizem (stopped on 3/17)/amiodarone infusion (stopped on 3/21), echo with lvef 60-65%, grade II diastolic dysfunction - now on coreg, CHADSVASC score 4( age, htn,dm2, diastolic chf), per cardiology Dr Ellyn Hack " suspect that the arrhythmia was related to his critical illness. May warrant OP monitor to determine +/- true Dx of Afib. Will ask EP to review EKGs, but for now, esp. In light of his recent acute illness, I would opt for NOT STARTING DOAC.""increasing BB dose as BP tolerates. " - Cardiology signed off, he needs outpatient cardiology follow up once able to be D/C'd   Insulin dependent Diabetes mellitus( prednisone induced? He was on prednisone for ITP) - A1c 6.6 - off steroids,  - CBG's ranging from 150-336 -continue adjust lantus/ssi  Hypokalemia -Patient's Potassium was 3.2 -Likely from Diuresis as it was steadily dropping -Replete with 40 mEQ of KCl BID -Repeat CMP in AM  HLD  -continue Atovastatin 40 mg po  Abnormal thyroid functions -TSH was 5, free T4 1.39 - recommend these be rechecked in 3-4 wks  Deconditioning and Generalized Weakness -PT Recommending SNF and will discharge when stable  Hypomagnesemia -Patients Mag level was 1.6 -Replete with IV Mag Sulfate -Repeat Mag Level in AM  DVT prophylaxis: TED Hose, SCD's; Heparin D/C'd Code Status: FULL CODE Family Communication: Discussed with Patient's wife at Bedside Disposition Plan: SNF when medically  stable  Consultants:   Cardiology (signed off on 3/26)  Oncology ( Oncology Dr Gudena/Dr Magrinat/Dr. Janine Ores, NP follow peripherally now (if any hematology issues arise p [phone].)  General Surgery (continue to follow  daily,but oked with discharge)  Interventional Radiology  Pulmonology /critical care ( signed off on 3/26, reconsulted and evaluated patient 3/28)   Procedures:   Left Thoracentesis by IR on 3/14  s/p Transfusions of 4 pools of Platelets   s/p 4prbc transfusion  s/p Splenectomy on 3/15  intubation on 3/15, self extubation on 3/15, reintubation on 3/15, extubation on 3/22  Bilateral chest tube placement on 3/16, removed on 3/25  R picc line placement on 3/17  Aline discontinued on 3/23  nPlate given 0/9/81  Antimicrobials: Anti-infectives    Start     Dose/Rate Route Frequency Ordered Stop   07/22/16 1400  ciprofloxacin (CIPRO) tablet 500 mg     500 mg Oral 2 times daily 07/22/16 1223 07/28/16 2029   07/16/16 1200  cefTAZidime (FORTAZ) 2 g in dextrose 5 % 50 mL IVPB  Status:  Discontinued     2 g 100 mL/hr over 30 Minutes Intravenous Every 8 hours 07/16/16 0942 07/22/16 1207   07/15/16 0600  vancomycin (VANCOCIN) 1,250 mg in sodium chloride 0.9 % 250 mL IVPB  Status:  Discontinued     1,250 mg 166.7 mL/hr over 90 Minutes Intravenous Every 12 hours 07/14/16 1901 07/16/16 0942   07/15/16 0200  piperacillin-tazobactam (ZOSYN) IVPB 3.375 g  Status:  Discontinued     3.375 g 12.5 mL/hr over 240 Minutes Intravenous Every 8 hours 07/14/16 1901 07/16/16 0942   07/14/16 1930  vancomycin (VANCOCIN) 1,500 mg in sodium chloride 0.9 % 500 mL IVPB     1,500 mg 250 mL/hr over 120 Minutes Intravenous  Once 07/14/16 1901 07/14/16 2251   07/14/16 1930  piperacillin-tazobactam (ZOSYN) IVPB 3.375 g     3.375 g 100 mL/hr over 30 Minutes Intravenous  Once 07/14/16 1901 07/14/16 2058     Subjective: Seen and examined at bedside states he hasn't been sleeping well. No nausea or vomiting. Legs significantly improved and breathing the same. No nosebleeds today.   Objective: Vitals:   07/30/16 0851 07/30/16 0906 07/30/16 1359 07/30/16 1454  BP: 112/82  (!) 102/50   Pulse:   83   Resp:    18   Temp:   99.5 F (37.5 C)   TempSrc:   Oral   SpO2:  92% 97% 95%  Weight:      Height:        Intake/Output Summary (Last 24 hours) at 07/30/16 1724 Last data filed at 07/30/16 1445  Gross per 24 hour  Intake             1680 ml  Output             2000 ml  Net             -320 ml   Filed Weights   07/26/16 0513 07/29/16 0544 07/30/16 0412  Weight: 84.7 kg (186 lb 11.7 oz) 83 kg (182 lb 15.7 oz) 84.8 kg (186 lb 15.2 oz)   Examination: Physical Exam:  Constitutional: NAD and appears calm and comfortable sitting bedside in chair Eyes: Lids and conjunctivae normal, sclerae anicteric  ENMT: External Ears, Nose appear normal. Grossly normal hearing.  Neck: Appears normal, supple, no cervical masses, normal ROM, no appreciable thyromegaly Respiratory: Diminished to auscultation bilaterally with some mild crackles. Normal respiratory  effort and patient is not tachypenic. No accessory muscle use. Wearing Supplemental O2 via [place].  Cardiovascular: RRR, no murmurs / rubs / gallops. S1 and S2 auscultated. Trace Lower Extremity Edema. Wearing compression stockings again Abdomen: Soft, non-tender, non-distended. No masses palpated. No appreciable hepatosplenomegaly. Bowel sounds positive x4.  GU: Deferred. Musculoskeletal: No clubbing / cyanosis of digits/nails. No joint deformity upper and lower extremities.  Skin: No rashes, lesions, ulcers on limited skin evaluation. No induration; Warm and dry.  Neurologic: CN 2-12 grossly intact with no focal deficits. Romberg sign cerebellar reflexes not assessed.  Psychiatric: Normal judgment and insight. Alert and oriented x 3. Normal mood and appropriate affect.   Data Reviewed: I have personally reviewed following labs and imaging studies  CBC:  Recent Labs Lab 07/24/16 0355  07/26/16 0457 07/27/16 0423 07/28/16 0546 07/29/16 0418 07/30/16 0329  WBC 13.7*  < > 15.0* 12.5* 10.9* 12.1* 11.6*  NEUTROABS 10.9*  --   --  9.4* 8.2* 9.1* 8.6*   HGB 9.1*  < > 8.6* 8.4* 8.5* 8.7* 8.9*  HCT 29.8*  < > 28.0* 27.0* 27.3* 27.5* 28.7*  MCV 89.0  < > 88.9 88.2 87.8 87.9 87.8  PLT 219  < > 134* 67* 36* 17* 14*  < > = values in this interval not displayed. Basic Metabolic Panel:  Recent Labs Lab 07/24/16 0355  07/26/16 0457 07/27/16 0423 07/28/16 0546 07/29/16 0418 07/30/16 0329  NA 140  < > 136 139 139 138 139  K 4.0  < > 4.5 3.8 3.7 3.5 3.2*  CL 103  < > 95* 91* 92* 88* 87*  CO2 32  < > 37* 40* 43* 43* 45*  GLUCOSE 165*  < > 215* 176* 156* 179* 181*  BUN 33*  < > 28* 18 13 11 10   CREATININE 0.81  < > 0.68 0.58* 0.67 0.61 0.51*  CALCIUM 8.5*  < > 8.7* 8.6* 8.4* 8.2* 8.3*  MG 1.7  --   --  1.5* 1.6* 1.7 1.6*  PHOS  --   --   --  2.0* 2.6 2.4* 2.9  < > = values in this interval not displayed. GFR: Estimated Creatinine Clearance: 86.3 mL/min (A) (by C-G formula based on SCr of 0.51 mg/dL (L)). Liver Function Tests:  Recent Labs Lab 07/27/16 0423 07/28/16 0546 07/29/16 0418 07/30/16 0329  AST 25 21 23 18   ALT 23 20 20 18   ALKPHOS 111 101 99 97  BILITOT 0.4 0.7 0.5 0.4  PROT 5.3* 5.4* 5.2* 5.3*  ALBUMIN 2.0* 1.9* 1.9* 2.0*   No results for input(s): LIPASE, AMYLASE in the last 168 hours. No results for input(s): AMMONIA in the last 168 hours. Coagulation Profile: No results for input(s): INR, PROTIME in the last 168 hours. Cardiac Enzymes: No results for input(s): CKTOTAL, CKMB, CKMBINDEX, TROPONINI in the last 168 hours. BNP (last 3 results)  Recent Labs  02/17/16 0948 05/05/16 1450  PROBNP 40.0 17.0   HbA1C: No results for input(s): HGBA1C in the last 72 hours. CBG:  Recent Labs Lab 07/29/16 1757 07/29/16 2111 07/30/16 0751 07/30/16 1148 07/30/16 1650  GLUCAP 150* 220* 166* 336* 245*   Lipid Profile: No results for input(s): CHOL, HDL, LDLCALC, TRIG, CHOLHDL, LDLDIRECT in the last 72 hours. Thyroid Function Tests: No results for input(s): TSH, T4TOTAL, FREET4, T3FREE, THYROIDAB in the last 72  hours. Anemia Panel: No results for input(s): VITAMINB12, FOLATE, FERRITIN, TIBC, IRON, RETICCTPCT in the last 72 hours. Sepsis Labs: No results for  input(s): PROCALCITON, LATICACIDVEN in the last 168 hours.  No results found for this or any previous visit (from the past 240 hour(s)).   Radiology Studies: No results found. Scheduled Meds: . aminocaproic acid  1 g Oral Q8H  . atorvastatin  40 mg Per Tube q1800  . budesonide (PULMICORT) nebulizer solution  0.25 mg Nebulization BID  . carvedilol  3.125 mg Oral Once  . carvedilol  6.25 mg Oral BID WC  . furosemide  40 mg Intravenous Daily  . guaiFENesin  600 mg Oral BID  . insulin aspart  0-20 Units Subcutaneous TID WC  . insulin glargine  8 Units Subcutaneous Daily  . ipratropium  0.5 mg Nebulization TID  . lactose free nutrition  237 mL Oral TID BM  . levalbuterol  1.25 mg Nebulization TID  . loratadine  10 mg Oral Daily  . magnesium oxide  200 mg Oral Daily  . mouth rinse  15 mL Mouth Rinse BID  . multivitamin with minerals  1 tablet Oral Daily  . polyethylene glycol  17 g Oral Daily  . potassium chloride  40 mEq Oral Q6H  . romiPLOStim  1 mcg/kg Subcutaneous Weekly  . senna-docusate  1 tablet Oral BID  . sodium chloride flush  10-40 mL Intracatheter Q12H  . sodium chloride flush  3 mL Intravenous Q12H   Continuous Infusions:   LOS: 21 days   Kerney Elbe, DO [facility] Pager [phone]  If 7PM-7AM, please contact night-coverage [email or web address] Password [facility] 07/30/2016, 5:23 PM

## 2016-07-30 NOTE — Progress Notes (Signed)
Pt states he takes his Amicar every 7 hours.  Currently it is ordered every 8 hours here and wants it changed to every 7 hours please.

## 2016-07-30 NOTE — Progress Notes (Signed)
IP PROGRESS NOTE  Subjective:   No bleeding.  Objective: Vital signs in last 24 hours: Blood pressure (!) 110/55, pulse 79, temperature 97.8 F (36.6 C), temperature source Oral, resp. rate 18, height 5\' 11"  (1.803 m), weight 186 lb 15.2 oz (84.8 kg), SpO2 93 %.  Intake/Output from previous day: 03/31 0701 - 04/01 0700 In: 1680 [P.O.:1680] Out: 2625 [Urine:2625]  Physical Exam:  HEENT: No thrush or bleeding  Abdomen: No hepatomegaly Extremities: Trace low leg/ankle edema bilaterally Skin: No petechiae  Portacath/PICC-without erythema  Lab Results:  Recent Labs  07/29/16 0418 07/30/16 0329  WBC 12.1* 11.6*  HGB 8.7* 8.9*  HCT 27.5* 28.7*  PLT 17* 14*    BMET  Recent Labs  07/29/16 0418 07/30/16 0329  NA 138 139  K 3.5 3.2*  CL 88* 87*  CO2 43* 45*  GLUCOSE 179* 181*  BUN 11 10  CREATININE 0.61 0.51*  CALCIUM 8.2* 8.3*    Studies/Results: No results found.  Medications: I have reviewed the patient's current medications.  Assessment/Plan: 1. Refractory ITP, status post splenectomy 07/13/2016 2. Respiratory failure secondary to COPD and empyema 3. SVT/paroxysmal atrial fibrillation 4. Pneumonia/Septic shock secondary to pseudomonas aeruginosa 5. Diabetes  [name] has a history of refractory ITP.The platelet count remains low now greater than 2 weeks out from a splenectomy. I will begin a trial of nplate. [name] agrees to proceed. We can consider Promacta or pulse Decadron if the thrombocytopenia does not respond to nplate.      LOS: 21 days   Betsy Coder, MD   07/30/2016, 8:41 AM

## 2016-07-31 ENCOUNTER — Inpatient Hospital Stay (HOSPITAL_COMMUNITY): Payer: Medicare HMO

## 2016-07-31 DIAGNOSIS — Z8249 Family history of ischemic heart disease and other diseases of the circulatory system: Secondary | ICD-10-CM

## 2016-07-31 DIAGNOSIS — Z800 Family history of malignant neoplasm of digestive organs: Secondary | ICD-10-CM

## 2016-07-31 DIAGNOSIS — E118 Type 2 diabetes mellitus with unspecified complications: Secondary | ICD-10-CM

## 2016-07-31 DIAGNOSIS — Z87891 Personal history of nicotine dependence: Secondary | ICD-10-CM

## 2016-07-31 DIAGNOSIS — Z794 Long term (current) use of insulin: Secondary | ICD-10-CM

## 2016-07-31 DIAGNOSIS — Z888 Allergy status to other drugs, medicaments and biological substances status: Secondary | ICD-10-CM

## 2016-07-31 DIAGNOSIS — Z79899 Other long term (current) drug therapy: Secondary | ICD-10-CM

## 2016-07-31 DIAGNOSIS — L8990 Pressure ulcer of unspecified site, unspecified stage: Secondary | ICD-10-CM | POA: Insufficient documentation

## 2016-07-31 LAB — CBC WITH DIFFERENTIAL/PLATELET
BASOS ABS: 0 10*3/uL (ref 0.0–0.1)
BASOS PCT: 0 %
EOS PCT: 1 %
Eosinophils Absolute: 0.1 10*3/uL (ref 0.0–0.7)
HEMATOCRIT: 27.7 % — AB (ref 39.0–52.0)
Hemoglobin: 8.7 g/dL — ABNORMAL LOW (ref 13.0–17.0)
Lymphocytes Relative: 19 %
Lymphs Abs: 2.5 10*3/uL (ref 0.7–4.0)
MCH: 27.4 pg (ref 26.0–34.0)
MCHC: 31.4 g/dL (ref 30.0–36.0)
MCV: 87.1 fL (ref 78.0–100.0)
MONO ABS: 1.2 10*3/uL — AB (ref 0.1–1.0)
MONOS PCT: 9 %
NEUTROS ABS: 9.1 10*3/uL — AB (ref 1.7–7.7)
Neutrophils Relative %: 71 %
PLATELETS: 5 10*3/uL — AB (ref 150–400)
RBC: 3.18 MIL/uL — ABNORMAL LOW (ref 4.22–5.81)
RDW: 16.3 % — AB (ref 11.5–15.5)
WBC: 12.8 10*3/uL — ABNORMAL HIGH (ref 4.0–10.5)

## 2016-07-31 LAB — COMPREHENSIVE METABOLIC PANEL
ALBUMIN: 1.9 g/dL — AB (ref 3.5–5.0)
ALT: 19 U/L (ref 17–63)
ANION GAP: 5 (ref 5–15)
AST: 20 U/L (ref 15–41)
Alkaline Phosphatase: 93 U/L (ref 38–126)
BUN: 12 mg/dL (ref 6–20)
CHLORIDE: 89 mmol/L — AB (ref 101–111)
CO2: 42 mmol/L — AB (ref 22–32)
Calcium: 8.2 mg/dL — ABNORMAL LOW (ref 8.9–10.3)
Creatinine, Ser: 0.59 mg/dL — ABNORMAL LOW (ref 0.61–1.24)
GFR calc Af Amer: >60 mL/min (ref >60)
GFR calc non Af Amer: >60 mL/min (ref >60)
GLUCOSE: 193 mg/dL — AB (ref 65–99)
POTASSIUM: 4.3 mmol/L (ref 3.5–5.1)
Sodium: 136 mmol/L (ref 135–145)
Total Bilirubin: 0.4 mg/dL (ref 0.3–1.2)
Total Protein: 5.4 g/dL — ABNORMAL LOW (ref 6.5–8.1)

## 2016-07-31 LAB — BASIC METABOLIC PANEL
BUN: 12 mg/dL (ref 4–21)
CREATININE: 0.6 mg/dL (ref 0.6–1.3)
GLUCOSE: 193 mg/dL
Potassium: 4.3 mmol/L (ref 3.4–5.3)
SODIUM: 136 mmol/L — AB (ref 137–147)

## 2016-07-31 LAB — CBC AND DIFFERENTIAL
HCT: 28 % — AB (ref 41–53)
Hemoglobin: 8.7 g/dL — AB (ref 13.5–17.5)
PLATELETS: 5 10*3/uL — AB (ref 150–399)
WBC: 12.8 10^3/mL

## 2016-07-31 LAB — GLUCOSE, CAPILLARY
GLUCOSE-CAPILLARY: 189 mg/dL — AB (ref 65–99)
GLUCOSE-CAPILLARY: 126 mg/dL — AB (ref 65–99)
GLUCOSE-CAPILLARY: 95 mg/dL (ref 65–99)
Glucose-Capillary: 273 mg/dL — ABNORMAL HIGH (ref 65–99)

## 2016-07-31 LAB — HEPATIC FUNCTION PANEL
ALK PHOS: 93 U/L (ref 25–125)
ALT: 20 U/L (ref 10–40)
AST: 19 U/L (ref 14–40)
BILIRUBIN, TOTAL: 0.4 mg/dL

## 2016-07-31 LAB — PHOSPHORUS: PHOSPHORUS: 2.6 mg/dL (ref 2.5–4.6)

## 2016-07-31 LAB — MAGNESIUM: Magnesium: 1.7 mg/dL (ref 1.7–2.4)

## 2016-07-31 MED ORDER — BUDESONIDE 0.5 MG/2ML IN SUSP
0.5000 mg | Freq: Two times a day (BID) | RESPIRATORY_TRACT | Status: DC
  Administered 2016-07-31 – 2016-08-02 (×4): 0.5 mg via RESPIRATORY_TRACT
  Filled 2016-07-31 (×4): qty 2

## 2016-07-31 MED ORDER — ZINC OXIDE 40 % EX OINT
TOPICAL_OINTMENT | Freq: Two times a day (BID) | CUTANEOUS | Status: DC
  Administered 2016-07-31 – 2016-08-02 (×5): via TOPICAL
  Filled 2016-07-31: qty 114

## 2016-07-31 MED ORDER — TECHNETIUM TC 99M SULFUR COLLOID
5.2000 | Freq: Once | INTRAVENOUS | Status: AC | PRN
  Administered 2016-07-31: 5.2 via INTRAVENOUS

## 2016-07-31 MED ORDER — SODIUM CHLORIDE 0.9 % IV SOLN: Freq: Once | INTRAVENOUS | Status: DC

## 2016-07-31 NOTE — Progress Notes (Signed)
ADDENDUM:  I had not noticed that the patient had a total of 7 units platelets transfused pre-splenectomy-- that accounts for the "bump" in his counts 07/12/2016

## 2016-07-31 NOTE — Progress Notes (Signed)
qPhysical Therapy Treatment Patient Details Name: [name] MRN: [id number] DOB: [birth year] Today's Date: 07/31/2016    History of Present Illness [name] is a 76 y.o. male with medical history significant of recently diagnosed with ITP, bronchiectasis, hypertension, diabetes mellitus type 2, afib,Presented to the ED 3/10/18complaining of shortness of breath. 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 Complicated further by baseline COPD and Bronchiectasis . Extubated 07/20/16. Bilateral chest tubes in place.    PT Comments    The patient is slowly progressing in ambulation distance. Requires oxygen at 3 liters. Continue PT.    Follow Up Recommendations  SNF;Supervision/Assistance - 24 hour     Equipment Recommendations  None recommended by PT    Recommendations for Other Services       Precautions / Restrictions Precautions Precautions: Fall Precaution Comments: monitor HR and sats, on O2    Mobility  Bed Mobility               General bed mobility comments: OOB in recliner  Transfers Overall transfer level: Needs assistance Equipment used: Rolling walker (2 wheeled) Transfers: Sit to/from Stand Sit to Stand: Min assist;Min guard         General transfer comment: cues for safey, no assist to rise from recliner  Ambulation/Gait Ambulation/Gait assistance: Min assist;+2 safety/equipment Ambulation Distance (Feet): 96 Feet (then 90') Assistive device: Rolling walker (2 wheeled) Gait Pattern/deviations: Step-through pattern     General Gait Details: tolerated an increased distance, spouse assisted by following with recliner.  Remained on 3 lts nasal sats ranged from 94 to 88% VC's for deep purse lip breathing.     Stairs            Wheelchair Mobility    Modified Rankin (Stroke Patients Only)       Balance                                             Cognition Arousal/Alertness: Awake/alert                                            Exercises      General Comments        Pertinent Vitals/Pain Pain Assessment: No/denies pain Faces Pain Scale: No hurt    Home Living                      Prior Function            PT Goals (current goals can now be found in the care plan section) Progress towards PT goals: Progressing toward goals    Frequency    Min 3X/week      PT Plan Current plan remains appropriate    Co-evaluation             End of Session Equipment Utilized During Treatment: Oxygen Activity Tolerance: Patient tolerated treatment well;Patient limited by fatigue Patient left: in chair;with call bell/phone within reach;with nursing/sitter in room;with family/visitor present Nurse Communication: Mobility status PT Visit Diagnosis: Other abnormalities of gait and mobility (R26.89);Muscle weakness (generalized) (M62.81)     Time: 1050-1106 PT Time Calculation (min) (ACUTE ONLY): 16 min  Charges:  $Gait Training: 8-22 mins  G CodesTresa Endo PT [phone]    Claretha Cooper 07/31/2016, 12:15 PM

## 2016-07-31 NOTE — Progress Notes (Signed)
Nutrition Follow-up  DOCUMENTATION CODES:   Not applicable  INTERVENTION:   Boost Plus chocolate TID- Each supplement provides 360kcal and 14g protein.    MVI  NUTRITION DIAGNOSIS:   Inadequate oral intake related to inability to eat as evidenced by NPO status. Resolved   GOAL:   Patient will meet greater than or equal to 90% of their needs  MONITOR:   PO intake, Supplement acceptance, Labs, Weight trends, Skin  ASSESSMENT:   76 y.o. w/ HTN, DM, COPD, BTX and diastolic dysfxn, severe thrombocytopenia and recurrent pleural effusions w/ exudative by analysis dating back to Feb. He was being followed by Heme for ITP (did not respond to IVIG, or rituxan). Was admitted once again on 3/10 w/ HR 150s, shortness of breath and increased LE edema. Also felt to be in refractory ITP. Ultimately consulted by cards for SVT and surgery as well as heme for splenectomy. Treated supportively including a left thoracentesis on 2/14 which was reported as bloody but no fluid analysis sent. He returns to the ICU s/p splenectomy on 3/15 on the vent. Pt self extubated.  Chest tubes ultimately removed.    Pt postoperative course complicated by PNA with effusions. Pt continues to do well; eating 100% meals and drinking Boost Plus. Pt had chest tubes removed 3/25. 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 Pt with multiple weight fluctuations but appears to have lost weight since admit; RD unsure if weight loss r/t fluid changes as pt with recent bilateral chest effusion and chest tubes. PICC placed 3/17. Plan is for pt to have repeat CT chest imaging in 4 weeks to ensure resolution of opacities. Continue to encourage meals and supplements to encourage wound healing.    Medications reviewed and include: Lasix, insulin, Mg Oxide, MVI, miralax, senokot   Labs reviewed: Cl 89(L), CO2 42(H), creat 0.59(L), Ca 8.2(L) adj. 9.88 wnl,  Wbc- 12.8(H), Hgb 8.7(L), Hct 27.7(L) cbgs- 181, 193 x 24 hrs AIC  6.6(H)  Diet Order:  Diet Carb Modified Fluid consistency: Thin; Room service appropriate? Yes  Skin:  Wound (see comment) (Stage III buttocks, abdominal incision )  Last BM:  3/31  Height:   Ht Readings from Last 1 Encounters:  07/14/16 5\' 11"  (1.803 m)    Weight:   Wt Readings from Last 1 Encounters:  07/31/16 183 lb 13.8 oz (83.4 kg)    Ideal Body Weight:  78.18 kg  BMI:  Body mass index is 25.64 kg/m.  Estimated Nutritional Needs:   Kcal:  2090-2260 (25-27 kcal/kg)  Protein:  100-117 grams (1.2-1.4 grams/kg)  Fluid:  per MD/NP given edema and need for Lasix drip  EDUCATION NEEDS:   No education needs identified at this time  Koleen Distance, [place], [place] Pager #- [phone]

## 2016-07-31 NOTE — Consult Note (Signed)
[place] Nurse wound consult note Reason for Consult:Ongoing moisture associated skin damage (incontinence associated) to buttocks and sacral area.   Wound type:moisture and pressure Pressure Injury POA: Yes Measurement:1 cm x 1.5 cm x 0.1 cm to sacrum and extends to bilateral gluteal folds (erythema). Wound HUD:JSHF and moist Drainage (amount, consistency, odor) scant serous Periwound:blanchable erythema Dressing procedure/placement/frequency:Cleanse sacral wounds with soap and water and pat gently dry.  Apply Boudreaux's butt paste twice daily  And PRN incontinence.  Will not follow at this time.  Please re-consult if needed.  Domenic Moras RN BSN [place] Pager [phone]

## 2016-07-31 NOTE — Consult Note (Signed)
[facility]ious Disease       Reason for Consult: ? infection    Referring Physician: Dr. 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 Complicated further by baseline COPD and Bronchiectasis    Disorders of fluid, electrolyte, and acid-base balance   Anemia in acute on chronic illness   Severe protein-calorie malnutrition (HCC)   Anasarca   Severe muscle deconditioning   Septic shock (HCC)   Acute encephalopathy   Arterial hypotension   Pneumonia   Pleural effusion, bilateral   Pressure injury of skin   . sodium chloride   Intravenous Once  . sodium chloride   Intravenous Once  . aminocaproic acid  1 g Oral Q8H  . atorvastatin  40 mg Per Tube q1800  . budesonide (PULMICORT) nebulizer solution  0.5 mg Nebulization BID  . carvedilol  3.125 mg Oral Once  . carvedilol  6.25 mg Oral BID WC  . furosemide  40 mg Intravenous Daily  . guaiFENesin  600 mg Oral BID  . insulin aspart  0-20 Units Subcutaneous TID WC  . insulin glargine  8 Units Subcutaneous Daily  . ipratropium  0.5 mg Nebulization TID  . lactose free nutrition  237 mL Oral TID BM  . levalbuterol  1.25 mg Nebulization TID  . liver oil-zinc oxide   Topical BID  . loratadine  10 mg Oral Daily  . magnesium oxide  200 mg Oral Daily  . mouth rinse  15 mL Mouth Rinse BID  . multivitamin with minerals  1 tablet Oral Daily  . polyethylene glycol  17 g Oral Daily  . romiPLOStim  1 mcg/kg Subcutaneous Weekly  . senna-docusate  1 tablet Oral BID  . sodium chloride flush  10-40 mL Intracatheter Q12H  . sodium chloride flush  3 mL Intravenous Q12H     Recommendations: Continue to observe off of antibiotics   Assessment: He has had a recent lung infection with Pseudomonas and I was asked to comment on steroid use for ITP.  Also possible bronchiectasis.  He has no active infection at this time, though certainly is at risk and steroids increase anyone's risk for infection.  No absolute contraindication from an ID standpoint if steroids are needed for his ITP.    Antibiotics: none  HPI: [name] is a 76 y.o. male with chronic ITP, COPD on home O2, bronchiectasis and chronic pleural effussions who initially came in with tachycardia at home and underwent cardioversion and admitted.  With his ITP he underwent splenectomy by surgery and also had a left thoracentesis with a culture positive for Pseudomonas which he finished cipro on 3/30.  He was intubated for surgery and self-extubated, got reintubated and extubated again on 3/22 due to respiratory failure.  He has continued to have pleural effusions but improved after chest tubes.  I was asked to see him due to refractory ITP and use of steroids.   CXR independently reviewed and effusions much smaller compared to previous CXR.   Review of Systems:  Constitutional: negative for fevers, chills and anorexia Respiratory: positive for cough or sputum, negative for hemoptysis Gastrointestinal: negative for diarrhea All other systems reviewed and are negative  Past Medical History:  Diagnosis Date  . Adenomatous colon polyp    01/2006  . Chronic pansinusitis   . COPD (chronic obstructive pulmonary disease) ([place])   . Diverticula of colon   . History of ITP   . History of nephrolithiasis   . Hyperlipemia   . Hypertension   . 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 Pleural effusion   . Sinus drainage    Cough with post nasal drip  . Thrombocytopenia ([place])   . Type 2 diabetes mellitus ([place])     Social History  Substance Use Topics  . Smoking status: Former Smoker    Packs/day: 1.00     Years: 42.00    Types: Cigarettes    Start date: 05/02/1959    Quit date: 05/01/2001  . Smokeless tobacco: Never Used  . Alcohol use Yes     Comment: social    Family History  Problem Relation Age of Onset  . Colon cancer Mother   . Heart disease Mother   . Liver cancer Father   . Heart disease Father     Allergies  Allergen Reactions  . Heparin Other (See Comments)    Heparin-induced thrombocytopenia/thrombosis    Physical Exam: Constitutional: in no apparent distress and alert  Vitals:   07/31/16 1400 07/31/16 1443  BP: (!) 110/57   Pulse: 99 77  Resp: 13 18  Temp: 98.2 F (36.8 C)    EYES: anicteric ENMT: no thrush Cardiovascular: Cor RRR Respiratory: CTA B; normal respiratory effort GI: Bowel sounds are normal, soft, nt Musculoskeletal: no pedal edema noted Skin: negatives: no rash Hematologic: no thrush  Lab Results  Component Value Date   WBC 12.8 (H) 07/31/2016   HGB 8.7 (L) 07/31/2016   HCT 27.7 (L) 07/31/2016   MCV 87.1 07/31/2016   PLT 5 (LL) 07/31/2016    Lab Results  Component Value Date   CREATININE 0.59 (L) 07/31/2016   BUN 12 07/31/2016   NA 136 07/31/2016   K 4.3 07/31/2016   CL 89 (L) 07/31/2016   CO2 42 (H) 07/31/2016    Lab Results  Component Value Date   ALT 19 07/31/2016   AST 20 07/31/2016   ALKPHOS 93 07/31/2016     Microbiology: No results found for this or any previous visit (from the past 240 hour(s)).  Scharlene Gloss, [facility] for Infectious Disease [place] [email or web address] [phone] pager  [phone] cell 07/31/2016, 4:12 PM

## 2016-07-31 NOTE — Progress Notes (Signed)
Platelet count 5. Dr. Alen Blew aware. Per MD do not transfuse platelets at this time. Patient has no signs of active bleeding. VSS. Pt in no acute distress. Will continue to monitor closely.

## 2016-07-31 NOTE — Progress Notes (Signed)
[name]   DOB:Feb 21, 1941   ZO#:[id number]   JWJ#:[id number]  Subjective: [name] tells me his breathing is "no better." He coughs all the time, he says, and stillbrings up phlegm. When he walks the hall he feels exhausted. He wonders if it could be his heart (echo 07/09/2016 shows an excellent ejection fraction). Denies bleeding. His wife is currently in Rehab following hip replacement. No family in room   Objective: older White male examined inrecliner Vitals:   07/30/16 2057 07/31/16 0519  BP: (!) 98/52 103/61  Pulse: 79 80  Resp: 20 19  Temp: 97.7 F (36.5 C) 97.9 F (36.6 C)    Body mass index is 25.64 kg/m.  Intake/Output Summary (Last 24 hours) at 07/31/16 0814 Last data filed at 07/31/16 0726  Gross per 24 hour  Intake             1060 ml  Output             1000 ml  Net               60 ml     Sclerae unicteric  No cervical or supraclavicular adenopathy  Lungs no rhonchi or wheezes  Heart regular rate and rhythm  Abdomen soft, +BS  Neuro nonfocal   CBG (last 3)   Recent Labs  07/30/16 1148 07/30/16 1650 07/30/16 2202  GLUCAP 336* 245* 216*     Labs:  Lab Results  Component Value Date   WBC 12.8 (H) 07/31/2016   HGB 8.7 (L) 07/31/2016   HCT 27.7 (L) 07/31/2016   MCV 87.1 07/31/2016   PLT 5 (LL) 07/31/2016   NEUTROABS 9.1 (H) 07/31/2016    '@LASTCHEMISTRY'$ @  Urine Studies No results for input(s): UHGB, CRYS in the last 72 hours.  Invalid input(s): UACOL, UAPR, USPG, UPH, UTP, UGL, UKET, UBIL, UNIT, UROB, [place], UEPI, UWBC, [place], [place], [place], [place], [place]  Basic Metabolic Panel:  Recent Labs Lab 07/27/16 0423 07/28/16 0546 07/29/16 0418 07/30/16 0329 07/31/16 0349  NA 139 139 138 139 136  K 3.8 3.7 3.5 3.2* 4.3  CL 91* 92* 88* 87* 89*  CO2 40* 43* 43* 45* 42*  GLUCOSE 176* 156* 179* 181* 193*  BUN '18 13 11 10 12  '$ CREATININE 0.58* 0.67 0.61 0.51* 0.59*  CALCIUM 8.6* 8.4* 8.2* 8.3* 8.2*  MG 1.5* 1.6* 1.7 1.6* 1.7  PHOS 2.0* 2.6 2.4* 2.9  2.6   GFR Estimated Creatinine Clearance: 86.3 mL/min (A) (by C-G formula based on SCr of 0.59 mg/dL (L)). Liver Function Tests:  Recent Labs Lab 07/27/16 0423 07/28/16 0546 07/29/16 0418 07/30/16 0329 07/31/16 0349  AST '25 21 23 18 20  '$ ALT '23 20 20 18 19  '$ ALKPHOS 111 101 99 97 93  BILITOT 0.4 0.7 0.5 0.4 0.4  PROT 5.3* 5.4* 5.2* 5.3* 5.4*  ALBUMIN 2.0* 1.9* 1.9* 2.0* 1.9*   No results for input(s): LIPASE, AMYLASE in the last 168 hours. No results for input(s): AMMONIA in the last 168 hours. Coagulation profile No results for input(s): INR, PROTIME in the last 168 hours.  CBC:  Recent Labs Lab 07/27/16 0423 07/28/16 0546 07/29/16 0418 07/30/16 0329 07/31/16 0349  WBC 12.5* 10.9* 12.1* 11.6* 12.8*  NEUTROABS 9.4* 8.2* 9.1* 8.6* 9.1*  HGB 8.4* 8.5* 8.7* 8.9* 8.7*  HCT 27.0* 27.3* 27.5* 28.7* 27.7*  MCV 88.2 87.8 87.9 87.8 87.1  PLT 67* 36* 17* 14* 5*   Cardiac Enzymes: No results for input(s): CKTOTAL, CKMB, CKMBINDEX, TROPONINI in the  last 168 hours. BNP: Invalid input(s): POCBNP CBG:  Recent Labs Lab 07/29/16 2111 07/30/16 0751 07/30/16 1148 07/30/16 1650 07/30/16 2202  GLUCAP 220* 166* 336* 245* 216*   D-Dimer No results for input(s): DDIMER in the last 72 hours. Hgb A1c No results for input(s): HGBA1C in the last 72 hours. Lipid Profile No results for input(s): CHOL, HDL, LDLCALC, TRIG, CHOLHDL, LDLDIRECT in the last 72 hours. Thyroid function studies No results for input(s): TSH, T4TOTAL, T3FREE, THYROIDAB in the last 72 hours.  Invalid input(s): FREET3 Anemia work up No results for input(s): VITAMINB12, FOLATE, FERRITIN, TIBC, IRON, RETICCTPCT in the last 72 hours. Microbiology No results found for this or any previous visit (from the past 240 hour(s)).    Studies:  Dg Chest Port 1 View  Result Date: 07/30/2016 CLINICAL DATA:  Dyspnea, history of ITP with splenectomy 07/13/2016 EXAM: PORTABLE CHEST 1 VIEW COMPARISON:  07/27/2016  FINDINGS: Right-sided PICC line tip projects in the cavoatrial junction. The visualized heart and mediastinal contours are stable with aortic atherosclerosis. Small left and small to moderate right pleural effusions are again visualized with adjacent atelectasis. These appear without significant change. Small amount of extrapleural gas noted within the pleural space as before. There is small pulmonary vascular congestion with slightly low lung volumes as before. IMPRESSION: Persistent bilateral pleural effusions, small to moderate on the right and small on the left with adjacent atelectasis. Mild vascular congestion is seen. No significant change from prior. Electronically Signed   By: Ashley Royalty M.D.   On: 07/30/2016 20:18    Assessment: 76 y.o. 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/2016 showing increased MGKs and negative HIT screen 07/27/2016  (a) on aminocaproic acid TID  (2) treated with IVIG 1/18 and 2/13 w/o obvious response  (3) treated with rituximab x3 (05/30/2016 to 06/20/2016) without obvious response  (4) also received prednisone, taper completed 07/16/2016-- no obvious response  (5) s/p splenectomy 07/13/2016 with apparent response, but rapid relapse  (6) note platelet count appeared to be recovering 07/12/2016, BEFORE splenectomy  (a) this raises the question of possible delayed response to IVIG or rituximab  (7) romiplostim started 07/30/2016  Plan:  [name]'s situation is difficult and I discussed it extensively with him. If the splenectomy in fact temporarily normalized the platelet count, this raises the question of possible accessory spleens. I will write for a liver-spleen scan to evaluate this.  I am interested in the platelet count of 45 K the day BEFORE splenectomy. If  this is not an error, it suggests a possible response to his earlier treatments (IVIG, rituximab and steroids). If no accessory spleens are apparent, we can consider returning to those treatments. My concern of course is that all of them involve immunosuppression and the patient's lung function still appears borderline.  Meanwhile we have started romiplostim. In my experience this can take some weeks to work. You are appropriately continuing amicar.  Will follow with you.     Chauncey Cruel, MD 07/31/2016  8:14 AM Medical Oncology and Hematology [facility] [address] [place], [place] [address] Tel. [phone]    Fax. [phone]

## 2016-07-31 NOTE — Progress Notes (Signed)
PROGRESS NOTE    [name]  DXA:[id number] DOB: 1940/11/09 DOA: 07/08/2016 PCP: [name], MD  Brief Narrative:  [name] a 76 y.o.malewith medical history significant of hypertension, hyperlipidemia, diabetes mellitus (possible steroid induced), COPD  (home o2 3liters started in 06/2016), bronchiectasis, GERD, idiopathic thrombocytopenia, dCHF, bilateral pleural effusion,PSVT who presents with shortness of breath and bilateral leg edema.  He was recently hospitalized from 2/10 to 2/20 for pleural effusion( had thoracentesis and diuresed), SVT, ITP, he Returns to the ED on 3/11 for HR >150 noted while he was checking Vitals at home. ER performed electrical cardioverstion due to concomitant complaint of dyspnea and hypotension, he was started on Cardizem and amiodarone infusion and admitted to SDU under hospitalist service.  Had severe refractory ITP so Oncology consulted and recommended Splenectomy so General Surgery consulted patient underwent splenectomy on 3/15. Patient is s/p Left Thoracentesis 3/14, culture+ pseudomonas. He was  transferred to ICU intubated post op. He self extubated on 3/15, had to be reintubated due to respiratory failure. Extubated on 3/22  He has bilatearal chest tube placed on 3/16, he received TPA/DNAse due to loculated pleural effusion, chest tube removed on 3/25. He is transferred back to hospitalist service on 3/25  Repeat ct chest  On 3/27 showed continued consolidation;  Continued lasix if bp and cr allows, nutrition supplement as well. PCCM reconsulted for additional recc's  Still not bringing up much phlegm and has significant leg swelling so IV Lasix was doubled with improvement and now currently reduced to 1x day dosing. Pulmonary cleared patient to go to SNF however his platelet count started dropping so Heparin was stopped because of concern for HIT. Oncology reviewed course and do not feel as if it is HIT and nplate therapy started  today. Patient slowly feeling better however started having nose bleeding yesterday so Amnicar was restarted. Dr. Jana Hakim of Oncology evaluated today and ordered a Liver-Spleen Scan to r/o accessory Spleens.  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 Complicated further by baseline COPD and Bronchiectasis    Disorders of fluid, electrolyte, and acid-base balance   Anemia in acute on chronic illness   Severe protein-calorie malnutrition (HCC)   Anasarca   Severe muscle deconditioning   Septic shock (HCC)   Acute encephalopathy   Arterial hypotension   Pneumonia   Pleural effusion, bilateral   Pressure injury of skin  ITP and now Concern for HIT r/o'd out - failed multiple line of medical therapy,  plt count 5 on admission, nose bleed (was on around clock amicar), blood loss anemia -> Home Amnicar restarted 07/29/16 because patient had blood nose again - s/p Transfusions of 4 pools of Platelets s/p 4prbc transfusion, s/p Splenectomy on 3/15 -plt had normalized sicne 3/22 but started worsening 3/27 and now Platelets decreased to 5 -Sent HIT Panel and D/C''d Heparin Products; Heparin Induced Platelet Ab was 0.245 and WNL and after discussion with Dr. Benay Spice in Oncology do not feel as if patient has hit and it is more 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  -nPlate therapy initiated 07/30/16 and patient received dose of 85 mcg sq (weekly)  -Discussed with Dr. Jana Hakim and he is ordering Liver-Spleen Scan  to r/o Accessory Spleens -> Came back Negative.  -Dr. Jana Hakim wants to use IV Steroids but hesitant because of infection so requested ID  Consultation. Dr. Linus Salmons Evaluated and patient is s/p Treatment and states no active infection at this time.  -Surgery continue to follow and post splenectomy vaccinations per general surgery.  -Repeat CBC in AM  Hypoxic respiratory failure/septic shock from Pseudomonas Pneumonia, stable -Self extubated post op on 3/15, but reintubated due to hypoxic respiratory failure and septic shock/pna/empyema -Extubated on 3/22, on Ciprofloxacin 500 mg po BID for pna/empyema, blood culture no growth, pleural fluids from 3/16 +PSEUDOMONAS AERUGINOSA; Ciprofloxacin D/C'd 3/30 -Currently on 2-3liter o2supplement at rest, his o2 sats dropped to 86% briefly with increased activity on 3/26 -continue wean o2 as tolerated -Plan is for Repeat CXR/Chest CT in 4 weeks to ensure resolution of Opacities -> Follow up with Tilman Neat, NP on 4/10 at 12 noon  B/l pleural effusions, hydropneumothorax , bilateral pneumonia with pseudomonas empyema, Acute on Chronic CHF - S/p Left Thoracentesis done by IR  3/14 after platelet infusions , -s/p bilateral chest tube placed on 3/16 with TPA/DNAse ( not a candidate for VATs per pulmonology), cytology negative,  culture + psuedomonas, abx 11/14 as of 3/27, chest tubes removed on 3/25,  -patient continued to have congested cough, oxygen requirement, wbc seems trending up, Chest CT on 3/27 "Bilateral infiltrates right greater than left increasing particularly in the right lower lobe. Stable appearing bilateral pleural effusions with air pockets within." - Case was discussed with thoracic surgery Dr Roxy Manns over the phone by Dr. Erlinda Hong on 3/27, Dr Roxy Manns reviewed the chest CT who recommend continue aggressive pulmonary PT, abx, may consider bronchoscopy, nothing surgical currently, but if infection does not improve, he is at risk of developing empyema in the future in this immunocompromised patient" -consider broaden abx if wbc continue to trend up or if developing fever.  -C/w Lasix IV Daily  if bp and cr allows, Patient has diuresed 15.5 Liters.  -Nutrition supplement as well. -C/w Percussion Vest q4h -WBC 11.6 now  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 hypercarbia  -C/w Atrovent/Xopenex; Hypertonic Saline Nebs now Discontinued by Pulmonary -Chest Physiotherapy whith Percussion Vest and Flutter Valve -C/w Pulmnicort 0.5 mg Nebulized BID -Appreciated Pulmonology seeing patient again today  -he has an follow up appointment with Dr Melvyn Novas on 4/23 and follow up with Pulmonary Office 4/10 with Rexene Edison, NP.  -As Above  SVT At home and in ER (presenting symptom) Paroxysmal A-fib with RVR subsequently - s/p electrical cardioversion in ER following by Cardizem (stopped on 3/17)/amiodarone infusion (stopped on 3/21), echo with lvef 60-65%, grade II diastolic dysfunction - now on coreg, CHADSVASC score 4( age, htn,dm2, diastolic chf), per cardiology Dr Ellyn Hack " suspect that the arrhythmia was related to his critical illness. May warrant OP monitor to determine +/- true Dx of Afib. Will ask EP to review EKGs, but for now, esp. In light of his recent acute illness, I would opt for NOT STARTING DOAC.""increasing BB dose as BP tolerates. " - Cardiology signed off, he needs outpatient cardiology follow up once able to be D/C'd;   Insulin dependent Diabetes mellitus( prednisone induced? He was on prednisone for ITP) - A1c 6.6 - off steroids,  - CBG's ranging from 150-336 - Continue adjust lantus/ssi  Hypokalemia, improved -Patient's Potassium was 4.3 -Likely from Diuresis as it was steadily dropping -Repeat CMP in AM  HLD  -continue Atovastatin 40 mg po  Abnormal thyroid functions -  TSH was 5, free T4 1.39 - recommend these be rechecked in 3-4 wks  Deconditioning and Generalized Weakness -PT Recommending SNF and will discharge when stable  Hypomagnesemia -Patients Mag level was 1.7 -Replete with IV Mag Sulfate -Repeat Mag Level in AM  DVT prophylaxis: TED  Hose, SCD's; Heparin D/C'd Code Status: FULL CODE Family Communication: Discussed with Patient's wife at Bedside Disposition Plan: SNF when medically stable  Consultants:   Cardiology (signed off on 3/26)  Oncology ( Oncology Dr Gudena/Dr Magrinat/Dr. Janine Ores, NP follow peripherally now (if any hematology issues arise p [phone].)  General Surgery (continue to follow daily,but oked with discharge)  Interventional Radiology  Pulmonology /critical care ( signed off on 3/26, reconsulted and evaluated patient 3/28 and 4/2)  Infectious Diseases Dr. Scharlene Gloss   Procedures:   Left Thoracentesis by IR on 3/14  s/p Transfusions of 4 pools of Platelets   s/p 4prbc transfusion  s/p Splenectomy on 3/15  intubation on 3/15, self extubation on 3/15, reintubation on 3/15, extubation on 3/22  Bilateral chest tube placement on 3/16, removed on 3/25  R picc line placement on 3/17  Aline discontinued on 3/23  nPlate given 05/08/59  Antimicrobials: Anti-infectives    Start     Dose/Rate Route Frequency Ordered Stop   07/22/16 1400  ciprofloxacin (CIPRO) tablet 500 mg     500 mg Oral 2 times daily 07/22/16 1223 07/28/16 2029   07/16/16 1200  cefTAZidime (FORTAZ) 2 g in dextrose 5 % 50 mL IVPB  Status:  Discontinued     2 g 100 mL/hr over 30 Minutes Intravenous Every 8 hours 07/16/16 0942 07/22/16 1207   07/15/16 0600  vancomycin (VANCOCIN) 1,250 mg in sodium chloride 0.9 % 250 mL IVPB  Status:  Discontinued     1,250 mg 166.7 mL/hr over 90 Minutes Intravenous Every 12 hours 07/14/16 1901 07/16/16 0942   07/15/16 0200  piperacillin-tazobactam (ZOSYN) IVPB 3.375 g  Status:  Discontinued     3.375 g 12.5 mL/hr over 240 Minutes Intravenous Every 8 hours 07/14/16 1901 07/16/16 0942   07/14/16 1930  vancomycin (VANCOCIN) 1,500 mg in sodium chloride 0.9 % 500 mL IVPB     1,500 mg 250 mL/hr over 120 Minutes Intravenous  Once 07/14/16 1901 07/14/16 2251     07/14/16 1930  piperacillin-tazobactam (ZOSYN) IVPB 3.375 g     3.375 g 100 mL/hr over 30 Minutes Intravenous  Once 07/14/16 1901 07/14/16 2058     Subjective: Seen and examined at bedside states he hasn't been sleeping well because of coughing at night. No nausea or vomiting. Legs stable. No nosebleeds today. Walked with PT and per their note is slowly progressing in ambulation distance.   Objective: Vitals:   07/31/16 0839 07/31/16 1400 07/31/16 1443 07/31/16 2054  BP:  (!) 110/57    Pulse: 73 99 77   Resp: 18 13 18    Temp:  98.2 F (36.8 C)    TempSrc:  Oral    SpO2: 92% 99% 97% 93%  Weight:      Height:        Intake/Output Summary (Last 24 hours) at 07/31/16 2108 Last data filed at 07/31/16 1709  Gross per 24 hour  Intake              590 ml  Output              800 ml  Net             -  210 ml   Filed Weights   07/29/16 0544 07/30/16 0412 07/31/16 0519  Weight: 83 kg (182 lb 15.7 oz) 84.8 kg (186 lb 15.2 oz) 83.4 kg (183 lb 13.8 oz)   Examination: Physical Exam:  Constitutional: NAD and appears calm and comfortable sitting bedside in chair Eyes: Lids and conjunctivae normal, sclerae anicteric  ENMT: External Ears, Nose appear normal. Grossly normal hearing.  Neck: Appears normal, supple, no cervical masses, normal ROM, no appreciable thyromegaly Respiratory: Diminished to auscultation bilaterally with some mild crackles more on Right than Left. Normal respiratory effort and patient is not tachypenic. No accessory muscle use. Wearing Supplemental O2 via [place].  Cardiovascular: RRR, no murmurs / rubs / gallops. S1 and S2 auscultated. Trace Lower Extremity Edema. Wearing compression stockings again Abdomen: Soft, non-tender, non-distended. No masses palpated. No appreciable hepatosplenomegaly. Bowel sounds positive x4.  GU: Deferred. Musculoskeletal: No clubbing / cyanosis of digits/nails. No joint deformity upper and lower extremities.  Skin: No rashes, lesions, ulcers  on limited skin evaluation. No induration; Warm and dry.  Neurologic: CN 2-12 grossly intact with no focal deficits. Romberg sign cerebellar reflexes not assessed.  Psychiatric: Normal judgment and insight. Alert and oriented x 3. Normal mood and appropriate affect.   Data Reviewed: I have personally reviewed following labs and imaging studies  CBC:  Recent Labs Lab 07/27/16 0423 07/28/16 0546 07/29/16 0418 07/30/16 0329 07/31/16 0349  WBC 12.5* 10.9* 12.1* 11.6* 12.8*  NEUTROABS 9.4* 8.2* 9.1* 8.6* 9.1*  HGB 8.4* 8.5* 8.7* 8.9* 8.7*  HCT 27.0* 27.3* 27.5* 28.7* 27.7*  MCV 88.2 87.8 87.9 87.8 87.1  PLT 67* 36* 17* 14* 5*   Basic Metabolic Panel:  Recent Labs Lab 07/27/16 0423 07/28/16 0546 07/29/16 0418 07/30/16 0329 07/31/16 0349  NA 139 139 138 139 136  K 3.8 3.7 3.5 3.2* 4.3  CL 91* 92* 88* 87* 89*  CO2 40* 43* 43* 45* 42*  GLUCOSE 176* 156* 179* 181* 193*  BUN 18 13 11 10 12   CREATININE 0.58* 0.67 0.61 0.51* 0.59*  CALCIUM 8.6* 8.4* 8.2* 8.3* 8.2*  MG 1.5* 1.6* 1.7 1.6* 1.7  PHOS 2.0* 2.6 2.4* 2.9 2.6   GFR: Estimated Creatinine Clearance: 86.3 mL/min (A) (by C-G formula based on SCr of 0.59 mg/dL (L)). Liver Function Tests:  Recent Labs Lab 07/27/16 0423 07/28/16 0546 07/29/16 0418 07/30/16 0329 07/31/16 0349  AST 25 21 23 18 20   ALT 23 20 20 18 19   ALKPHOS 111 101 99 97 93  BILITOT 0.4 0.7 0.5 0.4 0.4  PROT 5.3* 5.4* 5.2* 5.3* 5.4*  ALBUMIN 2.0* 1.9* 1.9* 2.0* 1.9*   No results for input(s): LIPASE, AMYLASE in the last 168 hours. No results for input(s): AMMONIA in the last 168 hours. Coagulation Profile: No results for input(s): INR, PROTIME in the last 168 hours. Cardiac Enzymes: No results for input(s): CKTOTAL, CKMB, CKMBINDEX, TROPONINI in the last 168 hours. BNP (last 3 results)  Recent Labs  02/17/16 0948 05/05/16 1450  PROBNP 40.0 17.0   HbA1C: No results for input(s): HGBA1C in the last 72 hours. CBG:  Recent Labs Lab  07/30/16 1650 07/30/16 2202 07/31/16 0800 07/31/16 1348 07/31/16 1654  GLUCAP 245* 216* 189* 273* 126*   Lipid Profile: No results for input(s): CHOL, HDL, LDLCALC, TRIG, CHOLHDL, LDLDIRECT in the last 72 hours. Thyroid Function Tests: No results for input(s): TSH, T4TOTAL, FREET4, T3FREE, THYROIDAB in the last 72 hours. Anemia Panel: No results for input(s): VITAMINB12, FOLATE, FERRITIN, TIBC, IRON,  RETICCTPCT in the last 72 hours. Sepsis Labs: No results for input(s): PROCALCITON, LATICACIDVEN in the last 168 hours.  No results found for this or any previous visit (from the past 240 hour(s)).   Radiology Studies: Nm Liver Spleen Scan  Result Date: 07/31/2016 CLINICAL DATA:  ITP status post splenectomy 07/13/2016. Thrombocytopenia. EXAM: NUCLEAR MEDICINE LIVER - SPLEEN SCAN TECHNIQUE: Abdominal images were obtained in multiple projections after intravenous injection of radiopharmaceutical. RADIOPHARMACEUTICALS:  5.2 mCi Tc-59m sulfur colloid IV COMPARISON:  Preoperative CT 06/09/2016. FINDINGS: There is homogeneous activity throughout the liver. No residual splenic tissue identified. Low-level activity is present throughout the bone marrow. IMPRESSION: Normal examination status post splenectomy. No residual or recurrent splenic uptake identified. Electronically Signed   By: Richardean Sale M.D.   On: 07/31/2016 14:09   Dg Chest Port 1 View  Result Date: 07/30/2016 CLINICAL DATA:  Dyspnea, history of ITP with splenectomy 07/13/2016 EXAM: PORTABLE CHEST 1 VIEW COMPARISON:  07/27/2016 FINDINGS: Right-sided PICC line tip projects in the cavoatrial junction. The visualized heart and mediastinal contours are stable with aortic atherosclerosis. Small left and small to moderate right pleural effusions are again visualized with adjacent atelectasis. These appear without significant change. Small amount of extrapleural gas noted within the pleural space as before. There is small pulmonary vascular  congestion with slightly low lung volumes as before. IMPRESSION: Persistent bilateral pleural effusions, small to moderate on the right and small on the left with adjacent atelectasis. Mild vascular congestion is seen. No significant change from prior. Electronically Signed   By: Ashley Royalty M.D.   On: 07/30/2016 20:18   Scheduled Meds: . sodium chloride   Intravenous Once  . sodium chloride   Intravenous Once  . aminocaproic acid  1 g Oral Q8H  . atorvastatin  40 mg Per Tube q1800  . budesonide (PULMICORT) nebulizer solution  0.5 mg Nebulization BID  . carvedilol  3.125 mg Oral Once  . carvedilol  6.25 mg Oral BID WC  . furosemide  40 mg Intravenous Daily  . guaiFENesin  600 mg Oral BID  . insulin aspart  0-20 Units Subcutaneous TID WC  . insulin glargine  8 Units Subcutaneous Daily  . ipratropium  0.5 mg Nebulization TID  . lactose free nutrition  237 mL Oral TID BM  . levalbuterol  1.25 mg Nebulization TID  . liver oil-zinc oxide   Topical BID  . loratadine  10 mg Oral Daily  . magnesium oxide  200 mg Oral Daily  . mouth rinse  15 mL Mouth Rinse BID  . multivitamin with minerals  1 tablet Oral Daily  . polyethylene glycol  17 g Oral Daily  . romiPLOStim  1 mcg/kg Subcutaneous Weekly  . senna-docusate  1 tablet Oral BID  . sodium chloride flush  10-40 mL Intracatheter Q12H  . sodium chloride flush  3 mL Intravenous Q12H   Continuous Infusions:   LOS: 22 days   Kerney Elbe, DO [facility] Pager [phone]  If 7PM-7AM, please contact night-coverage [email or web address] Password [email or web address] 07/31/2016, 9:08 PM

## 2016-07-31 NOTE — Progress Notes (Signed)
[facility]   Presenting HPI:  76 y.o. w/ HTN, DM, COPD, BTX and diastolic dysfxn, severe thrombocytopenia and recurrent pleural effusions w/ exudative by analysis dating back to Feb. He was being followed by Heme for ITP (did not respond to IVIG, or rituxan). Was admitted once again on 3/10 w/ HR 150s, shortness of breath and increased LE edema. Also felt to be in refractory ITP. Ultimately consulted by cards for SVT and surgery as well as heme for splenectomy. Treated supportively including a left thoracentesis on 2/14 which was reported as bloody but no fluid analysis sent. He returns to the ICU s/p splenectomy on 3/15 on the vent. Pt self extubated.  Chest tubes ultimately removed.    Subjective:   Pt reports concern that he is not "progressing" as well as he should.  Denies significant pain except when using vibra vest.  Pt remains on 3L/[place]  Temp:  [97.7 F (36.5 C)-99.5 F (37.5 C)] 97.9 F (36.6 C) (04/02 0519) Pulse Rate:  [73-83] 73 (04/02 0839) Resp:  [18-20] 18 (04/02 0839) BP: (98-110)/(50-61) 103/61 (04/02 0519) SpO2:  [92 %-97 %] 92 % (04/02 0839) Weight:  [183 lb 13.8 oz (83.4 kg)] 183 lb 13.8 oz (83.4 kg) (04/02 0519)  General: well developed adult male in NAD, sitting up in chair HEENT: MM pink/moist, good dentition  PSY: calm / appropriate Neuro: AAOx4, speech clear, MAE CV: s1s2 rrr, no m/r/g PULM: even/non-labored, lungs bilaterally diminished posterior, faint wheeze on L TK:WIOX, non-tender, bsx4 active  Extremities: warm/dry, 1-2+ BLE pitting edema  Skin: no rashes or lesions   CBC Latest Ref Rng & Units 07/31/2016 07/30/2016 07/29/2016  WBC 4.0 - 10.5 K/uL 12.8(H) 11.6(H) 12.1(H)  Hemoglobin 13.0 - 17.0 g/dL 8.7(L) 8.9(L) 8.7(L)  Hematocrit 39.0 - 52.0 % 27.7(L) 28.7(L) 27.5(L)  Platelets 150 - 400 K/uL 5(LL) 14(LL) 17(LL)    BMP Latest Ref Rng & Units 07/31/2016 07/30/2016 07/29/2016  Glucose 65 - 99 mg/dL 193(H) 181(H) 179(H)  BUN 6 - 20 mg/dL  12 10 11   Creatinine 0.61 - 1.24 mg/dL 0.59(L) 0.51(L) 0.61  Sodium 135 - 145 mmol/L 136 139 138  Potassium 3.5 - 5.1 mmol/L 4.3 3.2(L) 3.5  Chloride 101 - 111 mmol/L 89(L) 87(L) 88(L)  CO2 22 - 32 mmol/L 42(H) 45(H) 43(H)  Calcium 8.9 - 10.3 mg/dL 8.2(L) 8.3(L) 8.2(L)    IMAGING/STUDIES: PORT CXR 3/26 >>  Previously reviewed by me. Persistent bilateral lower lobe small pleural effusions versus opacity secondary to pneumonia. No pneumothorax appreciated. Low lung volumes. Bilateral chest tubes removed. CT CHEST W/O 3/27 >>  Previously reviewed by me. Small bilateral pleural effusions with some air pockets within the fluid post previous chest tubes. Bilateral lower lobe predominant consolidation with air bronchograms particularly on the right.  MICROBIOLOGY: MRSA PCR 3/11 >>  Negative Right Pleural Fluid Culture 3/16 >> Pseudomonas aeruginosa (pan-sensitive) Blood Cultures x2 3/16 >>  Negative  Urine Culture 3/17 >> Negative    ANTIBIOTICS: Vancomycin 3/16 - 3/18 Zosyn 3/16 - 3/18 Fortaz 3/18 - 3/24 Cipro 3/24 >> 3/30  LINES/TUBES: OETT 3/15 - 3/16 (self extubated); 3/16 - 3/22 Bilateral Chest tubes 3/16 - 3/25 RUE PICC 3/17 >>  SIGNIFICANT EVENTS: 03/10 - Admit 03/15 - Splenectomy 03/16 - Right thoracentesis:  800cc appearing as old blood 03/20 - Started TPA/DNase 03/21 - Lasix stopped due to metabolic alkalosis & Amiodarone stopped 03/22 - Finished TPA/DNase 03/27 - Requested PCCM to continue to follow given tenuous respiratory status  ASSESSMENT/PLAN:  76 y.o. male with ITP status post splenectomy. Postoperative course complicated by Pseudomonas pneumonia with parapneumonic effusions. Post bilateral chest tubes and intrapleural lytic therapy for exudative effusions. Improving leukocytosis is reassuring. Patient with improving airway clearance utilizing his current percussion vest and hypertonic nebs.   1.  Bilateral Complicated Pleural Effusions - s/p bilateral chest tubes  & intrapleural lytic therapy, chest tubes removed 3/25.    Plan: Follow serial CXR  Push pulmonary hygiene   2.  Bilateral Pseudomonas Pneumonia - completed cipro 3/30  Plan: Repeat CT Chest imaging in 4 weeks to ensure resolution of opacities  Follow up with Rexene Edison, NP arranged for 4/10 at 12 noon   3.  Acute Hypoxic Respiratory Failure - secondary to above   Plan: Push pulmonary hygiene  Wean O2 for sats > 92% Ambulate TID  OOB to chair   4.  Bronchiectasis   Plan: Continue chest vest  Nebulized saline     PCCM will be available PRN.  Please call back if new needs arise.   Noe Gens, NP-C [facility] Pgr: [phone] or if no answer [phone] 07/31/2016, 9:23 AM

## 2016-07-31 NOTE — Progress Notes (Signed)
OT Cancellation Note  Patient Details Name: [name] MRN: [id number] DOB: 02-Nov-1940   Cancelled Treatment:    Reason Eval/Treat Not Completed: Patient at procedure or test/ unavailable. Pt currently down for a liver/spleen scan. Will re-attempt tx at a later time.  Almon Register [id number] 07/31/2016, 12:18 PM

## 2016-07-31 NOTE — Progress Notes (Signed)
CRITICAL VALUE ALERT  Critical value received:  plt 5  Date of notification:  07/31/16  Time of notification:  8209  Critical value read back:Yes.    Nurse who received alert:  Virgina Norfolk  MD notified (1st page):  Schorr  Time of first page:  0430  MD notified (2nd page):  Time of second page:  Responding MD:  Schorr  Time MD responded: [phone]

## 2016-08-01 DIAGNOSIS — Q8901 Asplenia (congenital): Secondary | ICD-10-CM

## 2016-08-01 LAB — GLUCOSE, CAPILLARY
GLUCOSE-CAPILLARY: 168 mg/dL — AB (ref 65–99)
GLUCOSE-CAPILLARY: 188 mg/dL — AB (ref 65–99)
GLUCOSE-CAPILLARY: 254 mg/dL — AB (ref 65–99)
GLUCOSE-CAPILLARY: 121 mg/dL — AB (ref 65–99)

## 2016-08-01 LAB — CBC WITH DIFFERENTIAL/PLATELET
BASOS PCT: 0 %
Basophils Absolute: 0 10*3/uL (ref 0.0–0.1)
EOS ABS: 0.1 10*3/uL (ref 0.0–0.7)
Eosinophils Relative: 1 %
HCT: 31.2 % — ABNORMAL LOW (ref 39.0–52.0)
HEMOGLOBIN: 9.5 g/dL — AB (ref 13.0–17.0)
Lymphocytes Relative: 19 %
Lymphs Abs: 2.3 10*3/uL (ref 0.7–4.0)
MCH: 26.8 pg (ref 26.0–34.0)
MCHC: 30.4 g/dL (ref 30.0–36.0)
MCV: 87.9 fL (ref 78.0–100.0)
MONOS PCT: 6 %
Monocytes Absolute: 0.8 10*3/uL (ref 0.1–1.0)
NEUTROS PCT: 74 %
Neutro Abs: 9.3 10*3/uL — ABNORMAL HIGH (ref 1.7–7.7)
Platelets: 11 10*3/uL — CL (ref 150–400)
RBC: 3.55 MIL/uL — AB (ref 4.22–5.81)
RDW: 16.3 % — ABNORMAL HIGH (ref 11.5–15.5)
WBC: 12.5 10*3/uL — AB (ref 4.0–10.5)

## 2016-08-01 LAB — CREATININE, SERUM
Creatinine, Ser: 0.54 mg/dL — ABNORMAL LOW (ref 0.61–1.24)
GFR calc non Af Amer: >60 mL/min (ref >60)

## 2016-08-01 LAB — HEPATIC FUNCTION PANEL
ALK PHOS: 107 U/L (ref 25–125)
ALT: 21 U/L (ref 10–40)
AST: 28 U/L (ref 14–40)
BILIRUBIN, TOTAL: 0.5 mg/dL

## 2016-08-01 LAB — CBC AND DIFFERENTIAL
HCT: 31 % — AB (ref 41–53)
HEMOGLOBIN: 9.5 g/dL — AB (ref 13.5–17.5)
PLATELETS: 11 10*3/uL — AB (ref 150–399)
WBC: 12.5 10^3/mL

## 2016-08-01 LAB — COMPREHENSIVE METABOLIC PANEL
ALBUMIN: 2.2 g/dL — AB (ref 3.5–5.0)
ALK PHOS: 107 U/L (ref 38–126)
ALT: 21 U/L (ref 17–63)
ANION GAP: 6 (ref 5–15)
AST: 28 U/L (ref 15–41)
BUN: 10 mg/dL (ref 6–20)
CO2: 39 mmol/L — AB (ref 22–32)
Calcium: 8.5 mg/dL — ABNORMAL LOW (ref 8.9–10.3)
Chloride: 89 mmol/L — ABNORMAL LOW (ref 101–111)
Creatinine, Ser: 0.57 mg/dL — ABNORMAL LOW (ref 0.61–1.24)
GFR calc Af Amer: >60 mL/min (ref >60)
GFR calc non Af Amer: >60 mL/min (ref >60)
GLUCOSE: 224 mg/dL — AB (ref 65–99)
Potassium: 4.6 mmol/L (ref 3.5–5.1)
SODIUM: 134 mmol/L — AB (ref 135–145)
Total Bilirubin: 0.5 mg/dL (ref 0.3–1.2)
Total Protein: 6.1 g/dL — ABNORMAL LOW (ref 6.5–8.1)

## 2016-08-01 LAB — MAGNESIUM: Magnesium: 1.6 mg/dL — ABNORMAL LOW (ref 1.7–2.4)

## 2016-08-01 LAB — BASIC METABOLIC PANEL
BUN: 10 mg/dL (ref 4–21)
CREATININE: 0.6 mg/dL (ref 0.6–1.3)
Glucose: 224 mg/dL
Potassium: 4.6 mmol/L (ref 3.4–5.3)
SODIUM: 134 mmol/L — AB (ref 137–147)

## 2016-08-01 LAB — PHOSPHORUS: Phosphorus: 3 mg/dL (ref 2.5–4.6)

## 2016-08-01 MED ORDER — MENINGOCOCCAL A C Y&W-135 OLIG IM SOLR: 0.5000 mL | Freq: Once | INTRAMUSCULAR | Status: DC

## 2016-08-01 MED ORDER — HAEMOPHILUS B POLYSAC CONJ VAC IM SOLR
0.5000 mL | INTRAMUSCULAR | Status: DC | PRN
  Filled 2016-08-01: qty 0.5

## 2016-08-01 MED ORDER — MAGNESIUM SULFATE 2 GM/50ML IV SOLN
2.0000 g | Freq: Once | INTRAVENOUS | Status: AC
  Administered 2016-08-01: 2 g via INTRAVENOUS
  Filled 2016-08-01: qty 50

## 2016-08-01 MED ORDER — CARBAMIDE PEROXIDE 6.5 % OT SOLN
5.0000 [drp] | Freq: Two times a day (BID) | OTIC | Status: DC
  Administered 2016-08-01 – 2016-08-02 (×2): 5 [drp] via OTIC
  Filled 2016-08-01: qty 15

## 2016-08-01 MED ORDER — PNEUMOCOCCAL VAC POLYVALENT 25 MCG/0.5ML IJ INJ: 0.5000 mL | INJECTION | INTRAMUSCULAR | Status: DC

## 2016-08-01 NOTE — Progress Notes (Signed)
Patient up in chair eating breakfast.  No complaints of pain noted.

## 2016-08-01 NOTE — Progress Notes (Signed)
Pt states that he is tired and does not wish to use chest vest tonight.  Pt wants to use his flutter for tonight.  RT to monitor and assess as needed.

## 2016-08-01 NOTE — Progress Notes (Signed)
  Patient received post-splenectomy vaccinations on 3/29 - pneumococcal 23-valent, hib, and meningococcal.  He is recovering well from a surgical standpoint. Follow up provided. General surgery will sign off - please call with questions/concerns.  Obie Dredge, PA-C [facility] Pager: [phone] Consults: [phone] Mon-Fri 7:00 am-4:30 pm Sat-Sun 7:00 am-11:30 am

## 2016-08-01 NOTE — Progress Notes (Signed)
PROGRESS NOTE    [name]  DGU:[id number] DOB: 1941/02/19 DOA: 07/08/2016 PCP: [name], MD  Brief Narrative:  [name] a 76 y.o.malewith medical history significant of hypertension, hyperlipidemia, diabetes mellitus (possible steroid induced), COPD  (home o2 3liters started in 06/2016), bronchiectasis, GERD, idiopathic thrombocytopenia, dCHF, bilateral pleural effusion,PSVT who presents with shortness of breath and bilateral leg edema.  He was recently hospitalized from 2/10 to 2/20 for pleural effusion (had thoracentesis and diuresed), SVT, ITP, he Returns to the ED on 3/11 for HR >150 noted while he was checking Vitals at home. ER performed electrical cardioverstion due to concomitant complaint of dyspnea and hypotension, he was started on Cardizem and amiodarone infusion and admitted to SDU under hospitalist service.  Had severe refractory ITP so Oncology consulted and recommended Splenectomy so General Surgery consulted patient underwent splenectomy on 3/15. Patient is s/p Left Thoracentesis 3/14, culture+ pseudomonas. He was  transferred to ICU intubated post op. He self extubated on 3/15, had to be reintubated due to respiratory failure. Extubated on 3/22  He has bilatearal chest tube placed on 3/16, he received TPA/DNAse due to loculated pleural effusion, chest tube removed on 3/25. He was transferred back to hospitalist service on 3/25  Repeat CT chest on 3/27 showed continued consolidation;  Continued lasix if bp and cr allows, nutrition supplement as well. PCCM reconsulted for additional recc's  Was still not bringing up much phlegm so was placed on aggressive Chest Physiotherapy. Had significant leg swelling so IV Lasix was doubled with improvement and now currently reduced to 1x day dosing. Pulmonary cleared patient to go to SNF however his platelet count started dropping so Heparin was stopped because of concern for HIT.   Oncology reviewed case and do not  feel as if it is HIT and nplate therapy started 07/30/16. Patient slowly feeling better however started having nose bleeding yesterday so Amnicar was restarted. Dr. Jana Hakim of Oncology evaluated and ordered a Liver-Spleen Scan to r/o accessory Spleens which was negative. ID evaluated as Dr. Jana Hakim wanted ID clearance prior to giving IV Decadron (patient refused). Platelet count started coming up and was now 11 and after discussion with Dr. Jana Hakim would ideally like to check CBC in AM and have Platelet Counts above 20,000 prior to D/C'ing to SNF. Patient will also need Weekly Romiplostim Injections and will need to discuss transporting patient back to [facility] for weekly injections. Hopefully able to be D/C'd in AM to SNF.   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 Complicated further by baseline COPD and Bronchiectasis    Disorders of fluid, electrolyte, and acid-base balance   Anemia in acute on chronic illness   Severe protein-calorie malnutrition (HCC)   Anasarca   Severe muscle deconditioning   Septic shock (HCC)   Acute encephalopathy   Arterial hypotension   Pneumonia   Pleural effusion, bilateral   Pressure injury of skin  ITP and now Concern for HIT r/o'd out - failed multiple line of medical therapy,  plt count 5 on admission, nose bleed (was on around clock amicar), blood loss anemia -> Home Amnicar restarted 07/29/16 because patient had blood nose again - s/p Transfusions of 4  pools of Platelets s/p 4prbc transfusion, s/p Splenectomy on 3/15 -plt had normalized sicne 3/22 but started worsening 3/27 and now Platelets  decreased to 5 (07/31/16) -> IMPROVED TO 11 today -Sent HIT Panel and D/C''d Heparin Products; Heparin Induced Platelet Ab was 0.245 and WNL and after discussion with Dr. Benay Spice in Oncology do not feel as if patient has hit and it is more likely Refractory ITP.  -nPlate therapy initiated 07/30/16 and patient received dose of 85 mcg sq (weekly) -> Will need weekly indefinitely -Discussed with Dr. Jana Hakim and he is ordering Liver-Spleen Scan to r/o Accessory Spleens -> Came back Negative.  -Dr. Jana Hakim wants to use IV Steroids but hesitant because of infection so requested ID Consultation. Dr. Linus Salmons Evaluated and patient is s/p Treatment and states no active infection at this time. -Patient refused Decadron -Surgery followed and signed off and gave post splenectomy vaccinations on 3/29 (Pneumococcal 23-Valent, Hib and Meningococcal) -Per Discussion with Dr. Jana Hakim, he would like patient to have a Platelet Count over 20 prior to D/C'ing to SNF -Repeat CBC in AM  Hypoxic respiratory failure/septic shock from Pseudomonas Pneumonia, stable -Self extubated post op on 3/15, but reintubated due to hypoxic respiratory failure and septic shock/pna/empyema -Extubated on 3/22, on Ciprofloxacin 500 mg po BID for pna/empyema, blood culture no growth, pleural fluids from 3/16 +PSEUDOMONAS AERUGINOSA; Ciprofloxacin D/C'd 3/30 -Currently on 2-3liter o2supplement at rest, his o2 sats dropped to 86% briefly with increased activity on 3/26 -continue wean o2 as tolerated -Plan is for Repeat CXR/Chest CT in 4 weeks to ensure resolution of Opacities -> Follow up with Tilman Neat, NP on 4/10 at 12 noon  B/l pleural effusions, hydropneumothorax , bilateral pneumonia with pseudomonas empyema, Acute on Chronic CHF - S/p Left Thoracentesis done by IR  3/14 after platelet infusions , -s/p bilateral chest tube placed on 3/16 with TPA/DNAse ( not a candidate for VATs per pulmonology), cytology negative,  culture +  psuedomonas, abx 11/14 as of 3/27, chest tubes removed on 3/25,  -patient continued to have congested cough, oxygen requirement, wbc seems trending up, Chest CT on 3/27 "Bilateral infiltrates right greater than left increasing particularly in the right lower lobe. Stable appearing bilateral pleural effusions with air pockets within." - Case was discussed with thoracic surgery Dr Roxy Manns over the phone by Dr. Erlinda Hong on 3/27, Dr Roxy Manns reviewed the chest CT who recommend continue aggressive pulmonary PT, abx, may consider bronchoscopy, nothing surgical currently, but if infection does not improve, he is at risk of developing empyema in the future in this immunocompromised patient" -C/w Lasix IV Daily if bp and cr allows, Patient has diuresed 14.7 Liters.  -Nutrition supplement as well. 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  -As Above  SVT At home and in ER (presenting symptom) Paroxysmal A-fib with RVR subsequently - s/p electrical cardioversion in ER following by Cardizem (stopped on 3/17)/amiodarone infusion (stopped on 3/21), echo with lvef 60-65%, grade II diastolic dysfunction - now on coreg, CHADSVASC score 4( age, htn,dm2, diastolic chf), per cardiology Dr Ellyn Hack " suspect that the arrhythmia was related to his critical illness. May warrant OP monitor to  determine +/- true Dx of Afib. Will ask EP to review EKGs, but for now, esp. In light of his recent acute illness, I would opt for NOT STARTING DOAC.""increasing BB dose as BP tolerates. " - Cardiology signed off, he needs outpatient cardiology  follow up once able to be D/C'd;   Insulin dependent Diabetes mellitus( prednisone induced? He was on prednisone for ITP) - A1c 6.6 - off steroids,  - CBG's ranging from 95-188 - Continue adjust lantus/ssi  Hypokalemia, improved -Patient's Potassium was 4.6 -Was low likely from Diuresis -Repeat CMP in AM  HLD  -continue Atovastatin 40 mg po  Abnormal thyroid functions -TSH was 5, free T4 1.39 - recommend these be rechecked in 3-4 wks  Deconditioning and Generalized Weakness -PT Recommending SNF and will discharge when stable  Hypomagnesemia -Patients Mag level was 1.6 -Replete with IV Mag Sulfate and Daily Mag Oxide -Repeat Mag Level in AM  DVT prophylaxis: TED Hose, SCD's; Heparin D/C'd Code Status: FULL CODE Family Communication: Discussed with Patient's wife at Bedside Disposition Plan: SNF when medically stable Possibly tomorrow if Platelet Counts are above 20  Consultants:   Cardiology (signed off on 3/26)  Oncology ( Oncology Dr Gudena/Dr Magrinat/Dr. Janine Ores, NP follow peripherally now (if any hematology issues arise p [phone].)  General Surgery (Signed off on 08/01/16)  Thoracic Surgery Dr. Roxy Manns Discussed Case with Dr. Erlinda Hong on 3/27  Interventional Radiology  Pulmonology /critical care ( signed off on 3/26, reconsulted and evaluated patient 3/28 and 4/2)  Infectious Diseases Dr. Scharlene Gloss   Procedures:   Left Thoracentesis by IR on 3/14  s/p Transfusions of 4 pools of Platelets   s/p 4prbc transfusion  s/p Splenectomy on 3/15  intubation on 3/15, self extubation on 3/15, reintubation on 3/15, extubation on 3/22  Bilateral chest tube placement on 3/16, removed on 3/25  R picc line placement on 3/17  Aline discontinued on 3/23  nPlate given 10/03/31  Antimicrobials: Anti-infectives    Start     Dose/Rate Route Frequency Ordered Stop   07/22/16 1400  ciprofloxacin (CIPRO) tablet 500 mg     500 mg Oral 2  times daily 07/22/16 1223 07/28/16 2029   07/16/16 1200  cefTAZidime (FORTAZ) 2 g in dextrose 5 % 50 mL IVPB  Status:  Discontinued     2 g 100 mL/hr over 30 Minutes Intravenous Every 8 hours 07/16/16 0942 07/22/16 1207   07/15/16 0600  vancomycin (VANCOCIN) 1,250 mg in sodium chloride 0.9 % 250 mL IVPB  Status:  Discontinued     1,250 mg 166.7 mL/hr over 90 Minutes Intravenous Every 12 hours 07/14/16 1901 07/16/16 0942   07/15/16 0200  piperacillin-tazobactam (ZOSYN) IVPB 3.375 g  Status:  Discontinued     3.375 g 12.5 mL/hr over 240 Minutes Intravenous Every 8 hours 07/14/16 1901 07/16/16 0942   07/14/16 1930  vancomycin (VANCOCIN) 1,500 mg in sodium chloride 0.9 % 500 mL IVPB     1,500 mg 250 mL/hr over 120 Minutes Intravenous  Once 07/14/16 1901 07/14/16 2251   07/14/16 1930  piperacillin-tazobactam (ZOSYN) IVPB 3.375 g     3.375 g 100 mL/hr over 30 Minutes Intravenous  Once 07/14/16 1901 07/14/16 2058     Subjective: Seen and examined at bedside states his breathing was stable. No nausea or vomiting and did not sleep as well last night. No bleeding but complained of wax buildup in Left Ear. No other concerns or complaints but was encouraged that platelet count was  now 11.    Objective: Vitals:   08/01/16 0951 08/01/16 1000 08/01/16 1354 08/01/16 1432  BP:    109/61  Pulse: 83  80 86  Resp: 18  18 18   Temp:    98.7 F (37.1 C)  TempSrc:    Oral  SpO2: 93% 93%  98%  Weight:      Height:        Intake/Output Summary (Last 24 hours) at 08/01/16 1454 Last data filed at 08/01/16 1453  Gross per 24 hour  Intake              310 ml  Output             1700 ml  Net            -1390 ml   Filed Weights   07/30/16 0412 07/31/16 0519 08/01/16 0540  Weight: 84.8 kg (186 lb 15.2 oz) 83.4 kg (183 lb 13.8 oz) 82.4 kg (181 lb 10.5 oz)   Examination: Physical Exam:  Constitutional: NAD and appears calm and comfortable sitting bedside in chair Eyes: Lids and conjunctivae normal,  sclerae anicteric  ENMT: External Ears, Nose appear normal. Grossly normal hearing.  Neck: Appears normal, supple, no cervical masses, normal ROM, no appreciable thyromegaly Respiratory: Diminished to auscultation bilaterally with some mild crackles more on Right than Left. Normal respiratory effort and patient is not tachypenic. No accessory muscle use. Wearing Supplemental O2 via [place].  Cardiovascular: RRR, no murmurs / rubs / gallops. S1 and S2 auscultated. Trace Lower Extremity Edema. Wearing compression stockings again Abdomen: Soft, non-tender, non-distended. No masses palpated. No appreciable hepatomegaly. Bowel sounds positive x4.  GU: Deferred. Musculoskeletal: No clubbing / cyanosis of digits/nails. No joint deformity upper and lower extremities.  Skin: No rashes, lesions, ulcers on limited skin evaluation. No induration; Warm and dry.  Neurologic: CN 2-12 grossly intact with no focal deficits. Romberg sign cerebellar reflexes not assessed.  Psychiatric: Normal judgment and insight. Alert and oriented x 3. Normal mood and appropriate affect.   Data Reviewed: I have personally reviewed following labs and imaging studies  CBC:  Recent Labs Lab 07/28/16 0546 07/29/16 0418 07/30/16 0329 07/31/16 0349 08/01/16 0847  WBC 10.9* 12.1* 11.6* 12.8* 12.5*  NEUTROABS 8.2* 9.1* 8.6* 9.1* 9.3*  HGB 8.5* 8.7* 8.9* 8.7* 9.5*  HCT 27.3* 27.5* 28.7* 27.7* 31.2*  MCV 87.8 87.9 87.8 87.1 87.9  PLT 36* 17* 14* 5* 11*   Basic Metabolic Panel:  Recent Labs Lab 07/28/16 0546 07/29/16 0418 07/30/16 0329 07/31/16 0349 08/01/16 0425 08/01/16 0847  NA 139 138 139 136  --  134*  K 3.7 3.5 3.2* 4.3  --  4.6  CL 92* 88* 87* 89*  --  89*  CO2 43* 43* 45* 42*  --  39*  GLUCOSE 156* 179* 181* 193*  --  224*  BUN 13 11 10 12   --  10  CREATININE 0.67 0.61 0.51* 0.59* 0.54* 0.57*  CALCIUM 8.4* 8.2* 8.3* 8.2*  --  8.5*  MG 1.6* 1.7 1.6* 1.7  --  1.6*  PHOS 2.6 2.4* 2.9 2.6  --  3.0    GFR: Estimated Creatinine Clearance: 86.3 mL/min (A) (by C-G formula based on SCr of 0.57 mg/dL (L)). Liver Function Tests:  Recent Labs Lab 07/28/16 0546 07/29/16 0418 07/30/16 0329 07/31/16 0349 08/01/16 0847  AST 21 23 18 20 28   ALT 20 20 18 19 21   ALKPHOS 101 99 97 93 107  BILITOT 0.7 0.5  0.4 0.4 0.5  PROT 5.4* 5.2* 5.3* 5.4* 6.1*  ALBUMIN 1.9* 1.9* 2.0* 1.9* 2.2*   No results for input(s): LIPASE, AMYLASE in the last 168 hours. No results for input(s): AMMONIA in the last 168 hours. Coagulation Profile: No results for input(s): INR, PROTIME in the last 168 hours. Cardiac Enzymes: No results for input(s): CKTOTAL, CKMB, CKMBINDEX, TROPONINI in the last 168 hours. BNP (last 3 results)  Recent Labs  02/17/16 0948 05/05/16 1450  PROBNP 40.0 17.0   HbA1C: No results for input(s): HGBA1C in the last 72 hours. CBG:  Recent Labs Lab 07/31/16 1348 07/31/16 1654 07/31/16 2326 08/01/16 0734 08/01/16 1141  GLUCAP 273* 126* 95 168* 188*   Lipid Profile: No results for input(s): CHOL, HDL, LDLCALC, TRIG, CHOLHDL, LDLDIRECT in the last 72 hours. Thyroid Function Tests: No results for input(s): TSH, T4TOTAL, FREET4, T3FREE, THYROIDAB in the last 72 hours. Anemia Panel: No results for input(s): VITAMINB12, FOLATE, FERRITIN, TIBC, IRON, RETICCTPCT in the last 72 hours. Sepsis Labs: No results for input(s): PROCALCITON, LATICACIDVEN in the last 168 hours.  No results found for this or any previous visit (from the past 240 hour(s)).   Radiology Studies: Nm Liver Spleen Scan  Result Date: 07/31/2016 CLINICAL DATA:  ITP status post splenectomy 07/13/2016. Thrombocytopenia. EXAM: NUCLEAR MEDICINE LIVER - SPLEEN SCAN TECHNIQUE: Abdominal images were obtained in multiple projections after intravenous injection of radiopharmaceutical. RADIOPHARMACEUTICALS:  5.2 mCi Tc-75m sulfur colloid IV COMPARISON:  Preoperative CT 06/09/2016. FINDINGS: There is homogeneous activity  throughout the liver. No residual splenic tissue identified. Low-level activity is present throughout the bone marrow. IMPRESSION: Normal examination status post splenectomy. No residual or recurrent splenic uptake identified. Electronically Signed   By: Richardean Sale M.D.   On: 07/31/2016 14:09   Dg Chest Port 1 View  Result Date: 07/30/2016 CLINICAL DATA:  Dyspnea, history of ITP with splenectomy 07/13/2016 EXAM: PORTABLE CHEST 1 VIEW COMPARISON:  07/27/2016 FINDINGS: Right-sided PICC line tip projects in the cavoatrial junction. The visualized heart and mediastinal contours are stable with aortic atherosclerosis. Small left and small to moderate right pleural effusions are again visualized with adjacent atelectasis. These appear without significant change. Small amount of extrapleural gas noted within the pleural space as before. There is small pulmonary vascular congestion with slightly low lung volumes as before. IMPRESSION: Persistent bilateral pleural effusions, small to moderate on the right and small on the left with adjacent atelectasis. Mild vascular congestion is seen. No significant change from prior. Electronically Signed   By: Ashley Royalty M.D.   On: 07/30/2016 20:18   Scheduled Meds: . sodium chloride   Intravenous Once  . sodium chloride   Intravenous Once  . aminocaproic acid  1 g Oral Q8H  . atorvastatin  40 mg Per Tube q1800  . budesonide (PULMICORT) nebulizer solution  0.5 mg Nebulization BID  . carbamide peroxide  5 drop Left Ear BID  . carvedilol  3.125 mg Oral Once  . carvedilol  6.25 mg Oral BID WC  . furosemide  40 mg Intravenous Daily  . guaiFENesin  600 mg Oral BID  . insulin aspart  0-20 Units Subcutaneous TID WC  . insulin glargine  8 Units Subcutaneous Daily  . ipratropium  0.5 mg Nebulization TID  . lactose free nutrition  237 mL Oral TID BM  . levalbuterol  1.25 mg Nebulization TID  . liver oil-zinc oxide   Topical BID  . loratadine  10 mg Oral Daily  .  magnesium oxide  200 mg Oral Daily  . mouth rinse  15 mL Mouth Rinse BID  . multivitamin with minerals  1 tablet Oral Daily  . polyethylene glycol  17 g Oral Daily  . romiPLOStim  1 mcg/kg Subcutaneous Weekly  . senna-docusate  1 tablet Oral BID  . sodium chloride flush  10-40 mL Intracatheter Q12H  . sodium chloride flush  3 mL Intravenous Q12H   Continuous Infusions:   LOS: 23 days   Kerney Elbe, DO [facility] Pager [phone]  If 7PM-7AM, please contact night-coverage [email or web address] Password [email or web address] 08/01/2016, 2:54 PM

## 2016-08-01 NOTE — Progress Notes (Signed)
Kramer Hanrahan   DOB:[birth year]   GX#:[id number]   CXK#:[id number]  Subjective: Boy had an uneventful day yesterday-- "not much happened." He is no worse, perhaps a little better, as far as his breathing is concerned. Liver-spleen can went fine except it did not show any accessory spleen tissue. He denies epistaxis, gingival bleeding, BRBPR or hematuria; bruises easily. No family in room   Objective: older White male eating breakfast in recliner Vitals:   07/31/16 2332 08/01/16 0540  BP: 100/61 117/63  Pulse: 72 87  Resp: 16 16  Temp: 97.9 F (36.6 C) 98.1 F (36.7 C)    Body mass index is 25.34 kg/m.  Intake/Output Summary (Last 24 hours) at 08/01/16 0756 Last data filed at 08/01/16 0200  Gross per 24 hour  Intake              310 ml  Output             1200 ml  Net             -890 ml    CBG (last 3)   Recent Labs  07/31/16 1654 07/31/16 2326 08/01/16 0734  GLUCAP 126* 95 168*     Labs:  Lab Results  Component Value Date   WBC 12.8 (H) 07/31/2016   HGB 8.7 (L) 07/31/2016   HCT 27.7 (L) 07/31/2016   MCV 87.1 07/31/2016   PLT 5 (LL) 07/31/2016   NEUTROABS 9.1 (H) 07/31/2016    '@LASTCHEMISTRY'$ @  Urine Studies No results for input(s): UHGB, CRYS in the last 72 hours.  Invalid input(s): UACOL, UAPR, USPG, UPH, UTP, UGL, UKET, UBIL, UNIT, UROB, Page, UEPI, UWBC, Junie Panning [place], [place], [place]  Basic Metabolic Panel:  Recent Labs Lab 07/27/16 0423 07/28/16 0546 07/29/16 0418 07/30/16 0329 07/31/16 0349 08/01/16 0425  NA 139 139 138 139 136  --   K 3.8 3.7 3.5 3.2* 4.3  --   CL 91* 92* 88* 87* 89*  --   CO2 40* 43* 43* 45* 42*  --   GLUCOSE 176* 156* 179* 181* 193*  --   BUN '18 13 11 10 12  '$ --   CREATININE 0.58* 0.67 0.61 0.51* 0.59* 0.54*  CALCIUM 8.6* 8.4* 8.2* 8.3* 8.2*  --   MG 1.5* 1.6* 1.7 1.6* 1.7  --   PHOS 2.0* 2.6 2.4* 2.9 2.6  --    GFR Estimated Creatinine Clearance: 86.3 mL/min (A) (by C-G formula based on SCr of 0.54 mg/dL (L)). Liver  Function Tests:  Recent Labs Lab 07/27/16 0423 07/28/16 0546 07/29/16 0418 07/30/16 0329 07/31/16 0349  AST '25 21 23 18 20  '$ ALT '23 20 20 18 19  '$ ALKPHOS 111 101 99 97 93  BILITOT 0.4 0.7 0.5 0.4 0.4  PROT 5.3* 5.4* 5.2* 5.3* 5.4*  ALBUMIN 2.0* 1.9* 1.9* 2.0* 1.9*   No results for input(s): LIPASE, AMYLASE in the last 168 hours. No results for input(s): AMMONIA in the last 168 hours. Coagulation profile No results for input(s): INR, PROTIME in the last 168 hours.  CBC:  Recent Labs Lab 07/27/16 0423 07/28/16 0546 07/29/16 0418 07/30/16 0329 07/31/16 0349  WBC 12.5* 10.9* 12.1* 11.6* 12.8*  NEUTROABS 9.4* 8.2* 9.1* 8.6* 9.1*  HGB 8.4* 8.5* 8.7* 8.9* 8.7*  HCT 27.0* 27.3* 27.5* 28.7* 27.7*  MCV 88.2 87.8 87.9 87.8 87.1  PLT 67* 36* 17* 14* 5*   Cardiac Enzymes: No results for input(s): CKTOTAL, CKMB, CKMBINDEX, TROPONINI in the last 168 hours. BNP: Invalid input(s):  POCBNP CBG:  Recent Labs Lab 07/31/16 0800 07/31/16 1348 07/31/16 1654 07/31/16 2326 08/01/16 0734  GLUCAP 189* 273* 126* 95 168*   D-Dimer No results for input(s): DDIMER in the last 72 hours. Hgb A1c No results for input(s): HGBA1C in the last 72 hours. Lipid Profile No results for input(s): CHOL, HDL, LDLCALC, TRIG, CHOLHDL, LDLDIRECT in the last 72 hours. Thyroid function studies No results for input(s): TSH, T4TOTAL, T3FREE, THYROIDAB in the last 72 hours.  Invalid input(s): FREET3 Anemia work up No results for input(s): VITAMINB12, FOLATE, FERRITIN, TIBC, IRON, RETICCTPCT in the last 72 hours. Microbiology No results found for this or any previous visit (from the past 240 hour(s)).    Studies:  Nm Liver Spleen Scan  Result Date: 07/31/2016 CLINICAL DATA:  ITP status post splenectomy 07/13/2016. Thrombocytopenia. EXAM: NUCLEAR MEDICINE LIVER - SPLEEN SCAN TECHNIQUE: Abdominal images were obtained in multiple projections after intravenous injection of radiopharmaceutical.  RADIOPHARMACEUTICALS:  5.2 mCi Tc-60msulfur colloid IV COMPARISON:  Preoperative CT 06/09/2016. FINDINGS: There is homogeneous activity throughout the liver. No residual splenic tissue identified. Low-level activity is present throughout the bone marrow. IMPRESSION: Normal examination status post splenectomy. No residual or recurrent splenic uptake identified. Electronically Signed   By: WRichardean SaleM.D.   On: 07/31/2016 14:09   Dg Chest Port 1 View  Result Date: 07/30/2016 CLINICAL DATA:  Dyspnea, history of ITP with splenectomy 07/13/2016 EXAM: PORTABLE CHEST 1 VIEW COMPARISON:  07/27/2016 FINDINGS: Right-sided PICC line tip projects in the cavoatrial junction. The visualized heart and mediastinal contours are stable with aortic atherosclerosis. Small left and small to moderate right pleural effusions are again visualized with adjacent atelectasis. These appear without significant change. Small amount of extrapleural gas noted within the pleural space as before. There is small pulmonary vascular congestion with slightly low lung volumes as before. IMPRESSION: Persistent bilateral pleural effusions, small to moderate on the right and small on the left with adjacent atelectasis. Mild vascular congestion is seen. No significant change from prior. Electronically Signed   By: DAshley RoyaltyM.D.   On: 07/30/2016 20:18    Assessment: 76y.o. 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, as follows  (1) platelets <20K noted 05/05/2016, previously normal; workup included smear review (no schistocytes), negative HEP B and C studies, bone marrow biopsy 05/30/2016 showing increased MGKs and negative HIT screen 07/27/2016  (a) on aminocaproic acid TID  (2) treated with IVIG 1/18 and 2/13 w/o obvious response  (3) treated with rituximab x3 (05/30/2016 to 06/20/2016) without obvious response  (4) also received prednisone, taper completed 07/16/2016-- no obvious response  (5) s/p splenectomy  07/13/2016 with apparent response, but rapid relapse  (a) liver-spleen scan 07/31/2016 shows no accessory spleens  (6) platelet count >40K on 07/12/2016, before splenectomy, due to platelet transfusions pre-op  (7) romiplostim started 07/30/2016  (8) discussed high-dose dexamethasone with patient 08/01/2016--demurs    Plan:  Today's labs pending.  I reviewed results of liver-spleen scan with patient, also ID's evaluation commenting that of course steroids would somewhat increase risk of infection but can be tried. I proposed a 4 day course of high-dose dexamethasone and discussed possible complications/ side effects with mr [employer]. He is not encouraged, as he was on 90 mg prednisone/ day w/o effect in past. He wishes to "think about it" for now and wait on possible effectiveness of romiplostim.  Will follow with you.     MChauncey Cruel MD 08/01/2016  7:56 AM Medical Oncology  and Hematology [facility] [address] [place], [place] [address] Tel. [phone]    Fax. [phone]

## 2016-08-02 ENCOUNTER — Telehealth: Payer: Self-pay | Admitting: Oncology

## 2016-08-02 ENCOUNTER — Other Ambulatory Visit: Payer: Self-pay | Admitting: Oncology

## 2016-08-02 DIAGNOSIS — D693 Immune thrombocytopenic purpura: Secondary | ICD-10-CM

## 2016-08-02 LAB — CBC WITH DIFFERENTIAL/PLATELET
BASOS ABS: 0 10*3/uL (ref 0.0–0.1)
BASOS PCT: 0 %
Eosinophils Absolute: 0.1 10*3/uL (ref 0.0–0.7)
Eosinophils Relative: 1 %
HEMATOCRIT: 28.1 % — AB (ref 39.0–52.0)
HEMOGLOBIN: 8.7 g/dL — AB (ref 13.0–17.0)
Lymphocytes Relative: 20 %
Lymphs Abs: 2.7 10*3/uL (ref 0.7–4.0)
MCH: 27 pg (ref 26.0–34.0)
MCHC: 31 g/dL (ref 30.0–36.0)
MCV: 87.3 fL (ref 78.0–100.0)
Monocytes Absolute: 1.1 10*3/uL — ABNORMAL HIGH (ref 0.1–1.0)
Monocytes Relative: 8 %
NEUTROS ABS: 9.8 10*3/uL — AB (ref 1.7–7.7)
NEUTROS PCT: 71 %
Platelets: 10 10*3/uL — CL (ref 150–400)
RBC: 3.22 MIL/uL — ABNORMAL LOW (ref 4.22–5.81)
RDW: 16.4 % — ABNORMAL HIGH (ref 11.5–15.5)
WBC: 13.8 10*3/uL — ABNORMAL HIGH (ref 4.0–10.5)

## 2016-08-02 LAB — COMPREHENSIVE METABOLIC PANEL
ALBUMIN: 1.9 g/dL — AB (ref 3.5–5.0)
ALT: 20 U/L (ref 17–63)
AST: 24 U/L (ref 15–41)
Alkaline Phosphatase: 96 U/L (ref 38–126)
Anion gap: 4 — ABNORMAL LOW (ref 5–15)
BILIRUBIN TOTAL: 0.2 mg/dL — AB (ref 0.3–1.2)
BUN: 11 mg/dL (ref 6–20)
CALCIUM: 8.1 mg/dL — AB (ref 8.9–10.3)
CO2: 41 mmol/L — ABNORMAL HIGH (ref 22–32)
Chloride: 90 mmol/L — ABNORMAL LOW (ref 101–111)
Creatinine, Ser: 0.56 mg/dL — ABNORMAL LOW (ref 0.61–1.24)
GFR calc Af Amer: >60 mL/min (ref >60)
GFR calc non Af Amer: >60 mL/min (ref >60)
Glucose, Bld: 156 mg/dL — ABNORMAL HIGH (ref 65–99)
POTASSIUM: 3.8 mmol/L (ref 3.5–5.1)
Sodium: 135 mmol/L (ref 135–145)
TOTAL PROTEIN: 5.3 g/dL — AB (ref 6.5–8.1)

## 2016-08-02 LAB — BASIC METABOLIC PANEL
BUN: 11 mg/dL (ref 4–21)
CREATININE: 0.6 mg/dL (ref 0.6–1.3)
Glucose: 156 mg/dL
Sodium: 135 mmol/L — AB (ref 137–147)

## 2016-08-02 LAB — GLUCOSE, CAPILLARY
GLUCOSE-CAPILLARY: 217 mg/dL — AB (ref 65–99)
GLUCOSE-CAPILLARY: 139 mg/dL — AB (ref 65–99)
Glucose-Capillary: 231 mg/dL — ABNORMAL HIGH (ref 65–99)

## 2016-08-02 LAB — PHOSPHORUS: Phosphorus: 2.6 mg/dL (ref 2.5–4.6)

## 2016-08-02 LAB — HEPATIC FUNCTION PANEL: Bilirubin, Total: 0.2 mg/dL

## 2016-08-02 LAB — CBC AND DIFFERENTIAL: WBC: 13.8 10^3/mL

## 2016-08-02 LAB — MAGNESIUM: Magnesium: 1.8 mg/dL (ref 1.7–2.4)

## 2016-08-02 MED ORDER — BOOST PLUS PO LIQD: 237.0000 mL | Freq: Three times a day (TID) | ORAL | 0 refills | Status: DC

## 2016-08-02 MED ORDER — BUDESONIDE 0.5 MG/2ML IN SUSP: 0.5000 mg | Freq: Two times a day (BID) | RESPIRATORY_TRACT | 0 refills | Status: AC

## 2016-08-02 MED ORDER — ROMIPLOSTIM 250 MCG ~~LOC~~ SOLR: 1.0000 ug/kg | SUBCUTANEOUS | Status: DC

## 2016-08-02 MED ORDER — IPRATROPIUM BROMIDE 0.02 % IN SOLN: 0.5000 mg | Freq: Three times a day (TID) | RESPIRATORY_TRACT | 12 refills | Status: AC

## 2016-08-02 MED ORDER — MAGNESIUM OXIDE 400 (241.3 MG) MG PO TABS: 200.0000 mg | ORAL_TABLET | Freq: Every day | ORAL | Status: DC

## 2016-08-02 MED ORDER — CARVEDILOL 3.125 MG PO TABS: 3.1250 mg | ORAL_TABLET | Freq: Once | ORAL | 0 refills | Status: DC

## 2016-08-02 MED ORDER — FUROSEMIDE 20 MG PO TABS: 60.0000 mg | ORAL_TABLET | Freq: Every day | ORAL | Status: DC

## 2016-08-02 MED ORDER — CARVEDILOL 6.25 MG PO TABS: 6.2500 mg | ORAL_TABLET | Freq: Two times a day (BID) | ORAL | Status: DC

## 2016-08-02 MED ORDER — AMINOCAPROIC ACID 500 MG PO TABS: 1000.0000 mg | ORAL_TABLET | Freq: Three times a day (TID) | ORAL | Status: AC

## 2016-08-02 NOTE — Progress Notes (Signed)
Patient was picked up by EMS and transported to [facility]. Patients discharge summary was given and all questions answered. Day shift nurse Museum/gallery conservator called and gave report to nurse at [facility]. Thanks

## 2016-08-02 NOTE — Progress Notes (Signed)
[facility]   Presenting HPI:  76 y.o. w/ HTN, DM, COPD, BTX and diastolic dysfxn, severe thrombocytopenia and recurrent pleural effusions w/ exudative by analysis dating back to Feb. He was being followed by Heme for ITP (did not respond to IVIG, or rituxan). Was admitted once again on 3/10 w/ HR 150s, shortness of breath and increased LE edema. Also felt to be in refractory ITP. Ultimately consulted by cards for SVT and surgery as well as heme for splenectomy. Treated supportively including a left thoracentesis on 2/14 which was reported as bloody but no fluid analysis sent. He returns to the ICU s/p splenectomy on 3/15 on the vent. Pt self extubated.  Chest tubes ultimately removed.    Subjective:   Pt reports feeling better.  Concerned about getting back to "normal" for his breathing.  Reports dyspnea with eating  Temp:  [98.3 F (36.8 C)-98.7 F (37.1 C)] 98.5 F (36.9 C) (04/04 0518) Pulse Rate:  [72-93] 82 (04/04 0518) Resp:  [18] 18 (04/03 2038) BP: (102-112)/(60-62) 112/62 (04/04 0518) SpO2:  [93 %-98 %] 97 % (04/04 0518) Weight:  [184 lb 8.4 oz (83.7 kg)] 184 lb 8.4 oz (83.7 kg) (04/04 0518)  General: elderly male in NAD, sitting up in chair HEENT: MM pink/moist PSY: calm/appropriate Neuro: AAOx4,speech clear, MAE CV: s1s2 rrr, no m/r/g PULM: even/non-labored, lungs bilaterally coarse QB:HALP, non-tender, bsx4 active  Extremities: warm/dry, 1+ BLE edema  Skin: no rashes or lesions    CBC Latest Ref Rng & Units 08/02/2016 08/01/2016 07/31/2016  WBC 4.0 - 10.5 K/uL 13.8(H) 12.5(H) 12.8(H)  Hemoglobin 13.0 - 17.0 g/dL 8.7(L) 9.5(L) 8.7(L)  Hematocrit 39.0 - 52.0 % 28.1(L) 31.2(L) 27.7(L)  Platelets 150 - 400 K/uL 10(LL) 11(LL) 5(LL)    BMP Latest Ref Rng & Units 08/02/2016 08/01/2016 08/01/2016  Glucose 65 - 99 mg/dL 156(H) 224(H) -  BUN 6 - 20 mg/dL 11 10 -  Creatinine 0.61 - 1.24 mg/dL 0.56(L) 0.57(L) 0.54(L)  Sodium 135 - 145 mmol/L 135 134(L) -  Potassium  3.5 - 5.1 mmol/L 3.8 4.6 -  Chloride 101 - 111 mmol/L 90(L) 89(L) -  CO2 22 - 32 mmol/L 41(H) 39(H) -  Calcium 8.9 - 10.3 mg/dL 8.1(L) 8.5(L) -    IMAGING/STUDIES: PORT CXR 3/26 >>  Previously reviewed by me. Persistent bilateral lower lobe small pleural effusions versus opacity secondary to pneumonia. No pneumothorax appreciated. Low lung volumes. Bilateral chest tubes removed. CT CHEST W/O 3/27 >>  Previously reviewed by me. Small bilateral pleural effusions with some air pockets within the fluid post previous chest tubes. Bilateral lower lobe predominant consolidation with air bronchograms particularly on the right.  MICROBIOLOGY: MRSA PCR 3/11 >>  Negative Right Pleural Fluid Culture 3/16 >> Pseudomonas aeruginosa (pan-sensitive) Blood Cultures x2 3/16 >>  Negative  Urine Culture 3/17 >> Negative    ANTIBIOTICS: Vancomycin 3/16 - 3/18 Zosyn 3/16 - 3/18 Fortaz 3/18 - 3/24 Cipro 3/24 >> 3/30  LINES/TUBES: OETT 3/15 - 3/16 (self extubated); 3/16 - 3/22 Bilateral Chest tubes 3/16 - 3/25 RUE PICC 3/17 >>  SIGNIFICANT EVENTS: 03/10 - Admit 03/15 - Splenectomy 03/16 - Right thoracentesis:  800cc appearing as old blood 03/20 - Started TPA/DNase 03/21 - Lasix stopped due to metabolic alkalosis & Amiodarone stopped 03/22 - Finished TPA/DNase 03/27 - Requested PCCM to continue to follow given tenuous respiratory status   ASSESSMENT/PLAN:  76 y.o. male with ITP status post splenectomy. Postoperative course complicated by Pseudomonas pneumonia with parapneumonic effusions.  Post bilateral chest tubes and intrapleural lytic therapy for exudative effusions. Improving leukocytosis is reassuring. Patient with improving airway clearance utilizing his current percussion vest and hypertonic nebs.   1.  Bilateral Complicated Pleural Effusions - s/p bilateral chest tubes & intrapleural lytic therapy, chest tubes removed 3/25.    Plan: Follow serial CXR  Push pulmonary hygiene > discussed use  of flutter valve at home  2.  Bilateral Pseudomonas Pneumonia - completed cipro 3/30  Plan: Follow up with Pulmonary as arranged > Tammy Parrett, NP at noon on 4/10  Plan for repeat CT Chest in 4 weeks to ensure resolution of opacities   3.  Acute Hypoxic Respiratory Failure - secondary to above   Plan: Push pulmonary hygiene Plan for 2L O2 24/7 at discharge  He will need a portable (lightest available) tank at time of discharge from SNF Consider outpatient pulmonary rehab  4.  Bronchiectasis   Plan: Continue flutter valve TID    Noe Gens, NP-C [facility] Pgr: [phone] or if no answer [phone] 08/02/2016, 11:06 AM

## 2016-08-02 NOTE — Telephone Encounter (Signed)
Left message for patient re 4/9 appointments.

## 2016-08-02 NOTE — Progress Notes (Signed)
PT Cancellation Note  Patient Details Name: [name] MRN: [id number] DOB: [birth year]   Cancelled Treatment:    Reason Eval/Treat Not Completed: Patient politely declined.  Pt requesting to rest this afternoon.  Pt reports getting up with OT earlier and going to the bathroom.  Encouraged pt to get up to chair later for dinner.  Will check back tomorrow.   Santiago Glad L. Tamala Julian, [place] Pager [phone] 08/02/2016    Galen Manila 08/02/2016, 2:54 PM

## 2016-08-02 NOTE — Progress Notes (Addendum)
Occupational Therapy Treatment Patient Details Name: Yohann Curl MRN: [id number] DOB: May 17, 1940 Today's Date: 08/02/2016    History of present illness Kadarious Dikes is a 76 y.o. male with medical history significant of recently diagnosed with ITP, bronchiectasis, hypertension, diabetes mellitus type 2, afib,Presented to the ED 3/10/18complaining of shortness of breath. 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 Complicated further by baseline COPD and Bronchiectasis . Extubated 07/20/16.    OT comments  Pt wanted to stand at sink for grooming.  Plts 10 today.  Needed to use bathroom and walked to 3:1 commode.  Pt steady with RW and good safety awareness.  Rest breaks encouraged  Follow Up Recommendations  SNF    Equipment Recommendations  3 in 1 bedside commode    Recommendations for Other Services      Precautions / Restrictions Precautions Precautions: Fall Precaution Comments: monitor HR and sats, on O2 Restrictions Weight Bearing Restrictions: No       Mobility Bed Mobility               General bed mobility comments: OOB in recliner  Transfers   Equipment used: Rolling walker (2 wheeled)   Sit to Stand: Min guard         General transfer comment: cues for UE placement    Balance                                           ADL either performed or assessed with clinical judgement   ADL       Grooming: Oral care;Min guard;Standing                   Toilet Transfer: Min guard;Ambulation;BSC;RW   Toileting- Clothing Manipulation and Hygiene: Total assistance;Sit to/from stand         General ADL Comments: pt wanted to brush teeth:  stood to do this and he needed to use commode afterwards. Cues for rest breaks.       Vision       Perception     Praxis      Cognition Arousal/Alertness: Awake/alert Behavior During Therapy: WFL for tasks  assessed/performed Overall Cognitive Status: Within Functional Limits for tasks assessed                                          Exercises     Shoulder Instructions       General Comments      Pertinent Vitals/ Pain       Pain Assessment: No/denies pain  Home Living                                          Prior Functioning/Environment              Frequency           Progress Toward Goals  OT Goals(current goals can now be found in the care plan section)  Progress towards OT goals: Progressing toward goals     Plan   Plan needs to be updated   Co-evaluation  End of Session    OT Visit Diagnosis: Muscle weakness (generalized) (M62.81)   Activity Tolerance  Tolerated well; left in chair with visitor and pulmonary MD came in at end of session   Patient Left     Nurse Communication          Time: 4383-8184 OT Time Calculation (min): 25 min  Charges: OT General Charges $OT Visit: 1 Procedure OT Treatments $Self Care/Home Management : 23-37 mins  Lesle Chris, OTR/L [phone] 08/02/2016   [place] 08/02/2016, 11:45 AM

## 2016-08-02 NOTE — Progress Notes (Signed)
Attempted to call report to [facility] facility [phone]. NO answer, will place f/u call.

## 2016-08-02 NOTE — Progress Notes (Signed)
[name]   DOB:April 15, 1941   KP#:[id number]   NTZ#:[id number]  Subjective:  Discussed platelet count with patient-- no real change. No bleeding. He "has his doubts" about [facility] but is expecting to be discharged there today. His wife is still rehabbing after recent surgery and is not able to help him at home.   Objective: older White male eating breakfast in recliner Vitals:   08/01/16 2038 08/02/16 0518  BP: 102/60 112/62  Pulse: 72 82  Resp: 18   Temp: 98.3 F (36.8 C) 98.5 F (36.9 C)    Body mass index is 25.74 kg/m.  Intake/Output Summary (Last 24 hours) at 08/02/16 0814 Last data filed at 08/02/16 0521  Gross per 24 hour  Intake               60 ml  Output             2175 ml  Net            -2115 ml    CBG (last 3)   Recent Labs  08/01/16 1141 08/01/16 1752 08/01/16 2100  GLUCAP 188* 254* 121*     Labs:  Lab Results  Component Value Date   WBC 13.8 (H) 08/02/2016   HGB 8.7 (L) 08/02/2016   HCT 28.1 (L) 08/02/2016   MCV 87.3 08/02/2016   PLT 10 (LL) 08/02/2016   NEUTROABS 9.8 (H) 08/02/2016    '@LASTCHEMISTRY'$ @  Urine Studies No results for input(s): UHGB, CRYS in the last 72 hours.  Invalid input(s): UACOL, UAPR, USPG, UPH, UTP, UGL, UKET, UBIL, UNIT, UROB, [place], UEPI, UWBC, Junie Panning [place], [place], [place]  Basic Metabolic Panel:  Recent Labs Lab 07/29/16 [phone] 07/30/16 0329 07/31/16 0349 08/01/16 0425 08/01/16 0847 08/02/16 0432  NA 138 139 136  --  134* 135  K 3.5 3.2* 4.3  --  4.6 3.8  CL 88* 87* 89*  --  89* 90*  CO2 43* 45* 42*  --  39* 41*  GLUCOSE 179* 181* 193*  --  224* 156*  BUN '11 10 12  '$ --  10 11  CREATININE 0.61 0.51* 0.59* 0.54* 0.57* 0.56*  CALCIUM 8.2* 8.3* 8.2*  --  8.5* 8.1*  MG 1.7 1.6* 1.7  --  1.6* 1.8  PHOS 2.4* 2.9 2.6  --  3.0 2.6   GFR Estimated Creatinine Clearance: 85 mL/min (A) (by C-G formula based on SCr of 0.56 mg/dL (L)). Liver Function Tests:  Recent Labs Lab 07/29/16 0418 07/30/16 0329  07/31/16 0349 08/01/16 0847 08/02/16 0432  AST '23 18 20 28 24  '$ ALT '20 18 19 21 20  '$ ALKPHOS 99 97 93 107 96  BILITOT 0.5 0.4 0.4 0.5 0.2*  PROT 5.2* 5.3* 5.4* 6.1* 5.3*  ALBUMIN 1.9* 2.0* 1.9* 2.2* 1.9*   No results for input(s): LIPASE, AMYLASE in the last 168 hours. No results for input(s): AMMONIA in the last 168 hours. Coagulation profile No results for input(s): INR, PROTIME in the last 168 hours.  CBC:  Recent Labs Lab 07/29/16 0418 07/30/16 0329 07/31/16 0349 08/01/16 0847 08/02/16 0432  WBC 12.1* 11.6* 12.8* 12.5* 13.8*  NEUTROABS 9.1* 8.6* 9.1* 9.3* 9.8*  HGB 8.7* 8.9* 8.7* 9.5* 8.7*  HCT 27.5* 28.7* 27.7* 31.2* 28.1*  MCV 87.9 87.8 87.1 87.9 87.3  PLT 17* 14* 5* 11* 10*   Cardiac Enzymes: No results for input(s): CKTOTAL, CKMB, CKMBINDEX, TROPONINI in the last 168 hours. BNP: Invalid input(s): POCBNP CBG:  Recent Labs Lab 07/31/16 2326 08/01/16  9702 08/01/16 1141 08/01/16 1752 08/01/16 2100  GLUCAP 95 168* 188* 254* 121*   D-Dimer No results for input(s): DDIMER in the last 72 hours. Hgb A1c No results for input(s): HGBA1C in the last 72 hours. Lipid Profile No results for input(s): CHOL, HDL, LDLCALC, TRIG, CHOLHDL, LDLDIRECT in the last 72 hours. Thyroid function studies No results for input(s): TSH, T4TOTAL, T3FREE, THYROIDAB in the last 72 hours.  Invalid input(s): FREET3 Anemia work up No results for input(s): VITAMINB12, FOLATE, FERRITIN, TIBC, IRON, RETICCTPCT in the last 72 hours. Microbiology No results found for this or any previous visit (from the past 240 hour(s)).    Studies:  Nm Liver Spleen Scan  Result Date: 07/31/2016 CLINICAL DATA:  ITP status post splenectomy 07/13/2016. Thrombocytopenia. EXAM: NUCLEAR MEDICINE LIVER - SPLEEN SCAN TECHNIQUE: Abdominal images were obtained in multiple projections after intravenous injection of radiopharmaceutical. RADIOPHARMACEUTICALS:  5.2 mCi Tc-22msulfur colloid IV COMPARISON:   Preoperative CT 06/09/2016. FINDINGS: There is homogeneous activity throughout the liver. No residual splenic tissue identified. Low-level activity is present throughout the bone marrow. IMPRESSION: Normal examination status post splenectomy. No residual or recurrent splenic uptake identified. Electronically Signed   By: WRichardean SaleM.D.   On: 07/31/2016 14:09    Assessment: 76y.o. 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, as follows  (1) platelets <20K noted 05/05/2016, previously normal; workup included smear review (no schistocytes), negative HEP B and C studies, bone marrow biopsy 05/30/2016 showing increased MGKs and negative HIT screen 07/27/2016  (a) on aminocaproic acid TID  (2) treated with IVIG 1/18 and 2/13 w/o obvious response  (3) treated with rituximab x3 (05/30/2016 to 06/20/2016) without obvious response  (4) also received prednisone, taper completed 07/16/2016-- no obvious response  (5) s/p splenectomy 07/13/2016 with apparent response, but rapid relapse  (a) liver-spleen scan 07/31/2016 shows no accessory spleens  (6) platelet count >40K on 07/12/2016, before splenectomy, due to platelet transfusions pre-op  (7) romiplostim started 07/30/2016  (8) discussed high-dose dexamethasone with patient 08/01/2016--demurs    Plan:  It would be great if we got a rapid response to Nplate but it can take weeks before a response is seen. It would be unreasonable to keep him in the hospital just to monitor his platelet counts pending response. Accordingly discharge is planned and [place]  Would continue the amicar as prescribed. He will need to be on bleeding precautions.  Ideally he would have CBC done every Thursday and Monday and results can be faxed to [place](c/o Dr GLindi Adie [phone]. He is due for his next Nplate dose 077/41 In case the SNF cannot provide this expensive medication I have scheduled the patient to see [place] in the cancer center 08/07/2016 at 9:30 AM. We will give him his Nplate dose at that time  Greatly appreciate your help to this complex patient. Please let [place] if we can be of further help.   MChauncey Cruel MD 08/02/2016  8:14 AM Medical Oncology and [facility] [address]Tel. [phone]   Fax. [phone]

## 2016-08-02 NOTE — Progress Notes (Addendum)
Report called to Pilar Jarvis at [facility] [place] [facility] Questions, concerns denied. Also spoke to facility [facility] [phone] to facilitate transfer. At time of discharge pt was a&ox4, ambulatory with walker and 1 assist. No change from am assessment.

## 2016-08-02 NOTE — Care Management Important Message (Signed)
Important Message  Patient Details  Name: [name] MRN: [id number] Date of Birth: November 22, 1940   Medicare Important Message Given:  Yes    Kerin Salen 08/02/2016, 10:20 AMImportant Message  Patient Details  Name: [name] MRN: [id number] Date of Birth: 04-29-1941   Medicare Important Message Given:  Yes    Kerin Salen 08/02/2016, 10:20 AM

## 2016-08-02 NOTE — Progress Notes (Signed)
Chaplain stopped in to visit with patient and patient's spouse.  Patient is asleep.  Spouse states that he is mostly awake at night so he sleeps during the daytime.  Patient's spouse is awaiting a definite answer about discharge.  She states she would like to know because she is a Insurance account manager.  Chaplain stated she would share this information with Amber, the patient's nurse.    Chaplain spoke with [employer] who is well aware of the spouses concern and will be speaking with her soon.  Thank you Amber for your care for this patient and his family.    08/02/16 1507  Clinical Encounter Type  Visited With Family;Patient (Patient asleep)  Visit Type Follow-up;Social support;Spiritual support   Berneice Heinrich

## 2016-08-02 NOTE — Clinical Social Work Placement (Signed)
Patient accepted and received bed offer at [facility]. [facility] contacted, patient and family aware. Patient's RN can call report to [phone], packet complete.   CLINICAL SOCIAL WORK PLACEMENT  NOTE  Date:  08/02/2016  Patient Details  Name: [name] MRN: [id number] Date of Birth: Apr 25, 1941  Clinical Social Work is seeking post-discharge placement for this patient at the [place] level of care (*CSW will initial, date and re-position this form in  chart as items are completed):  Yes   Patient/family provided with [place] Work Department's list of facilities offering this level of care within the geographic area requested by the patient (or if unable, by the patient's family).  Yes   Patient/family informed of their freedom to choose among providers that offer the needed level of care, that participate in Medicare, Medicaid or managed care program needed by the patient, have an available bed and are willing to accept the patient.  Yes   Patient/family informed of [facility]'s ownership interest in [facility] and [facility], as well as of the fact that they are under no obligation to receive care at these facilities.  PASRR submitted to EDS on       PASRR number received on       Existing PASRR number confirmed on 07/25/16     FL2 transmitted to all facilities in geographic area requested by pt/family on 07/25/16     FL2 transmitted to all facilities within larger geographic area on       Patient informed that his/her managed care company has contracts with or will negotiate with certain facilities, including the following:        Yes   Patient/family informed of bed offers received.  Patient chooses bed at [facility]     Physician recommends and patient chooses bed at      Patient to be transferred to [facility] on 08/02/16.  Patient to be transferred to facility by [facility]      Patient  family notified on 08/02/16 of transfer.  Name of family member notified:  Charletta Cousin      PHYSICIAN       Additional Comment:    _______________________________________________ Burnis Medin, LCSW 08/02/2016, 4:11 PM

## 2016-08-02 NOTE — Discharge Summary (Signed)
Physician Discharge Summary  [name]  AYT:[id number]  DOB: Jun 05, 1940  DOA: 07/08/2016 PCP: [name], MD  Admit date: 07/08/2016 Discharge date: 08/02/2016  Admitted From: Home Disposition:  SNF  Recommendations for Outpatient Follow-up:  1. Follow up with PCP in 1-2 weeks 2. Please obtain BMP/CBC in one week to monitor Cr and platelets  3. Need to discussed NOAC with cardiology in 1-2 weeks  4. Follow up with pulmonary in 2-3 weeks 5. Repeat CXR in 3-4 weeks to assure resolution of opacities 6. Follow up with Heme/Onc - next Nplate dosed on 07/04/55  Equipment/Devices: O2 3L [place]  Discharge Condition: Improved  CODE STATUS: FULL  Diet recommendation: Heart Healthy  Brief/Interim Summary: Stephen Crawfordis a 76 y.o.malewith medical history significant of hypertension, hyperlipidemia, diabetes mellitus (possible steroid induced), COPD (home o2 3liters started in 06/2016), bronchiectasis, GERD, idiopathic thrombocytopenia, dCHF, bilateral pleural effusion,PSVT who presents with shortness of breath and bilateral leg edema.  He was recently hospitalized from 2/10 to 2/20 for pleural effusion (had thoracentesis and diuresed), SVT, ITP, he Returns to the ED on 3/11 for HR >150 noted while he was checking Vitals at home. ER performed electrical cardioverstion due to concomitant complaint of dyspnea and hypotension, he was started on Cardizem and amiodarone infusion and admitted to SDU under hospitalist service.  Had severe refractory ITP so Oncology consulted and recommended Splenectomy so General Surgery consulted patient underwent splenectomy on 3/15. Patient is s/p Left Thoracentesis 3/14, culture+ pseudomonas. He was  transferred to ICU intubated post op. He self extubated on 3/15, had to be reintubated due to respiratory failure. Extubated on 3/22  He has bilatearal chest tube placed on 3/16, he received TPA/DNAse due to loculated pleural effusion, chest tube removed on  3/25. He was transferred back to hospitalist service on 3/25  Repeat CT chest on 3/27 showed continued consolidation;  Continued lasix if bp and cr allows, nutrition supplement as well. PCCM reconsulted for additional recc's  Was still not bringing up much phlegm so was placed on aggressive Chest Physiotherapy. Had significant leg swelling so IV Lasix was doubled with improvement and now currently reduced to 1x day dosing. Pulmonary cleared patient to go to SNF however his platelet count started dropping so Heparin was stopped because of concern for HIT.   Oncology reviewed case and do not feel as if it is HIT and nplate therapy started 07/30/16. Patient slowly feeling better however started having nose bleeding yesterday so Amnicar was restarted. Dr. Jana Hakim of Oncology evaluated and ordered a Liver-Spleen Scan to r/o accessory Spleens which was negative.   Subjective: Patient seen and examined with wife at bedside. Patient concern about slow progression and reaccumulation of pleural effusion. No acute events overnight, breathing at baseline. Remains afebrile.   Discharge Diagnoses/Hospital Course:  ITP and now Concern for HIT r/o'd out - failed multiple line of medical therapy, plt count 5 on admission, nose bleed (was on around clock amicar), blood loss anemia -> Home Amnicar restarted 07/29/16 because patient had blood nose again - s/p Transfusions of 4 pools of Platelets s/p 4prbc transfusion, s/p Splenectomy on 3/15 -plt had normalized since 3/22 but started worsening 3/27 and now Platelets decreased to 5 -Sent HIT Panel and D/C''d Heparin Products; Heparin Induced Platelet Ab was 0.245 and WNL and after discussion with Oncology do not feel as if patient has HIT and it is more 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  -nPlate therapy initiated 07/30/16 and patient received dose of 85 mcg sq (weekly)  -  Discussed with Dr. Jana Hakim and he is ordering Liver-Spleen Scan to r/o Accessory Spleens -> Came back  Negative.  -Dr. Jana Hakim wants to use IV Steroids but hesitant because of infection so requested ID Consultation. Dr. Linus Salmons Evaluated and patient is s/p Treatment and states no active infection at this time.  -Surgery continue to follow andpost splenectomy vaccinations  -Nplate can take time to see response, patient will be discharge to SNF with close monitoring by heme/Onc team. Next dose 08/06/16 Patient has an appointment at the cancer center 08/07/16 at 9:30 AM   Hypoxic respiratory failure/septic shock from Pseudomonas Pneumonia, stable on O2 3L [place] -Self extubated post op on 3/15, reintubated due to hypoxic respiratory failure and septic shock/pna/empyema -Extubated on 3/22, on Ciprofloxacin 500 mg po BID for pna/empyema, blood culture no growth, pleural fluids from 3/16 +PSEUDOMONAS AERUGINOSA; Ciprofloxacin completed 3/30 -Currently on 2-3liter o2supplement at rest, his O2 sats dropped to 86% briefly with increased activity on 3/26 -Plan is for Repeat CXR/Chest CT in 4 weeks to ensure resolution of Opacities -> Follow up with Tilman Neat, NP on 4/10 at 12 noon  B/l pleural effusions, hydropneumothorax , bilateral pneumonia with pseudomonas empyema, Acute on Chronic CHF - S/p Left Thoracentesis done by IR 3/14 after platelet infusions , -s/p bilateral chest tube placed on 3/16 with TPA/DNAse ( not a candidate for VATs per pulmonology), cytology negative, culture + psuedomonas, abx 11/14 as of 3/27, chest tubes removed on 3/25,  -patient continued to have congested cough, oxygen requirement, wbc seems trending up, Chest CT on 3/27 "Bilateral infiltrates right greater than left increasing particularly in the right lower lobe.Stable appearing bilateral pleural effusions with air pockets within." - Case was discussed with thoracic surgery Dr Roxy Manns over the phone by Dr. Erlinda Hong on 3/27, Dr Roxy Manns reviewed the chest CT who recommend continue aggressive pulmonary PT, abx, may consider bronchoscopy,  nothing surgical currently, but if infection does not improve, he is at risk of developing empyema in the future in this immunocompromised patient" -Patient has diuresed ~ 15.5 Liters.  -Nutrition supplement as well. 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   SVT At home and in ER (presenting symptom) Paroxysmal A-fib with RVR subsequently - s/p electrical cardioversion in ER following by Cardizem (stopped on 3/17)/amiodarone infusion (stopped on 3/21), echo with lvef 60-65%, grade II diastolic dysfunction - now on coreg, CHADSVASC score 4( age, htn,dm2, diastolic chf), per cardiology Dr Ellyn Hack " suspect that the arrhythmia was related to his critical illness. May warrant OP monitor to determine +/- true Dx of Afib.  - He needs outpatient cardiology follow in 1 -2 weeks   Insulin dependent Diabetes mellitus( prednisone induced? He was on prednisone for ITP) - A1c 6.6 - off steroids,  - CBG's ranging from 150-336 - Continue adjust lantus/ssi  Hypokalemia resolved  -Check BMP in 1 week   HLD -continue Atovastatin 40 mg po  Abnormal thyroid functions -TSH was 5, free T4 1.39 - recommend these be rechecked in 3-4 wks  Deconditioning and Generalized Weakness -PT Recommending SNF and will discharge when stable  Hypomagnesemia - Resolved  All other chronic medical condition were stable during the hospitalization.  Patient was seen by physical therapy, recommending SNF  On the day of the discharge the patient's vitals were stable, and no other acute medical condition  were reported by patient. Patient was felt safe to be discharge to SNF  Discharge Instructions  You were cared for by a hospitalist during your hospital stay. If you have any questions about your discharge medications or the care you received while you were in the hospital after you are discharged, you can call the unit and asked to speak with the hospitalist on call if the hospitalist that took care of you is not available. Once you are discharged, your primary care physician will handle any further medical issues. Please note that NO REFILLS for any discharge medications will be authorized once you are discharged, as it is imperative that you return to your primary care physician (or establish a relationship with a primary care physician if you do not have one) for your aftercare needs so that they can reassess your need for medications and monitor your lab values.  Discharge Instructions    (HEART FAILURE PATIENTS) Call MD:  Anytime you have any of the following symptoms: 1) 3 pound weight gain in 24 hours or 5 pounds in 1 week 2) shortness of breath, with or without a dry hacking cough 3) swelling in the hands, feet or stomach 4) if you have to sleep on extra pillows at night in order to breathe.    Complete by:  As directed    AMB referral to pulmonary rehabilitation    Complete by:  As directed    Please select a program:  Pulmonary Rehabilitation (COPD)   COPD Diagnosis: (See requirements below):  COPD-Gold 3   Program Prescription:  O2 Administration by RT, EP, or RN if SpO2<88%   Call MD for:  difficulty breathing, headache or visual disturbances    Complete by:  As directed    Call MD for:  extreme fatigue    Complete by:  As directed    Call MD for:  hives    Complete by:  As directed    Call MD for:  persistant dizziness or light-headedness    Complete by:  As directed    Call MD for:  persistant nausea and vomiting    Complete by:  As directed    Call MD for:  redness,  tenderness, or signs of infection (pain, swelling, redness, odor or green/yellow discharge around incision site)    Complete by:  As directed    Call MD for:  severe uncontrolled pain    Complete by:  As directed    Call MD for:  temperature >100.4    Complete by:  As directed    Diet - low sodium heart healthy    Complete by:  As directed    Increase activity slowly    Complete by:  As directed      Allergies as of 08/02/2016      Reactions   Heparin Other (See Comments)   Heparin-induced thrombocytopenia/thrombosis      Medication List    STOP taking these medications   bumetanide 1 MG tablet Commonly known as:  BUMEX     TAKE these medications   albuterol 108 (90 Base) MCG/ACT inhaler Commonly known as:  PROAIR HFA 2 puffs every 4 hours as needed only  if your can't catch your breath   aminocaproic acid 500 MG tablet Commonly  known as:  AMICAR Take 2 tablets (1,000 mg total) by mouth every 8 (eight) hours.   budesonide 0.5 MG/2ML nebulizer solution Commonly known as:  PULMICORT Take 2 mLs (0.5 mg total) by nebulization 2 (two) times daily.   carvedilol 3.125 MG tablet Commonly known as:  COREG Take 1 tablet (3.125 mg total) by mouth once.   carvedilol 6.25 MG tablet Commonly known as:  COREG Take 1 tablet (6.25 mg total) by mouth 2 (two) times daily with a meal.   chlorpheniramine-HYDROcodone 10-8 MG/5ML Suer Commonly known as:  TUSSIONEX Take 5 mLs by mouth at bedtime as needed for cough.   cyclophosphamide 50 MG tablet Commonly known as:  CYTOXAN Take 3 tablets (150 mg total) by mouth daily. Give on an empty stomach 1 hour before or 2 hours after meals.   ECHINACEA EXTRACT PO Take 1 capsule by mouth daily.   famotidine 20 MG tablet Commonly known as:  PEPCID One at bedtime What changed:  how much to take  how to take this  when to take this  additional instructions   fexofenadine 180 MG tablet Commonly known as:  ALLEGRA Take 180 mg by mouth  daily.   FLUTTER Devi Use as directed   furosemide 20 MG tablet Commonly known as:  LASIX Take 3 tablets (60 mg total) by mouth daily.   GINKGO BILOBA PO Take 1 tablet by mouth daily.   GLUCOSAMINE 1500 COMPLEX PO Take 1,500 mg by mouth 2 (two) times daily.   Insulin Glargine 300 UNIT/ML Sopn Commonly known as:  TOUJEO SOLOSTAR Inject 30 Units into the skin daily.   ipratropium 0.02 % nebulizer solution Commonly known as:  ATROVENT Take 2.5 mLs (0.5 mg total) by nebulization 3 (three) times daily.   lactose free nutrition Liqd Take 237 mLs by mouth 3 (three) times daily between meals.   losartan 50 MG tablet Commonly known as:  COZAAR Take 1 tablet (50 mg total) by mouth daily.   magnesium oxide 400 (241.3 Mg) MG tablet Commonly known as:  MAG-OX Take 0.5 tablets (200 mg total) by mouth daily. Start taking on:  08/03/2016   metFORMIN 1000 MG tablet Commonly known as:  GLUCOPHAGE Take 1,000 mg by mouth 2 (two) times daily with a meal.   MULTIVITAMIN PO Take 1 tablet by mouth daily.   pantoprazole 40 MG tablet Commonly known as:  PROTONIX Take 1 tablet (40 mg total) by mouth daily. Take 30-60 min before first meal of the day   predniSONE 10 MG tablet Commonly known as:  DELTASONE Take 1 tablet (10 mg total) by mouth daily with breakfast.   romiPLOStim 250 MCG injection Commonly known as:  NPLATE Inject 5.46 mLs (85 mcg total) into the skin once a week. Start taking on:  08/06/2016   simvastatin 80 MG tablet Commonly known as:  ZOCOR Take 80 mg by mouth daily.       Contact information for follow-up providers    Sinclair Grooms, MD Follow up.   Specialty:  Cardiology Why:  The office will call you with a follow up appt Contact information: [address] [place] [address] [phone]        Stark Klein, MD Follow up in 3 week(s).   Specialty:  General Surgery Contact information: [place]  [address] [phone]            Contact information for after-discharge care    Destination  [facility] Follow up.   Specialty:  [place] information: [address] [address] [phone]                 Allergies  Allergen Reactions  . Heparin Other (See Comments)    Heparin-induced thrombocytopenia/thrombosis    Consultations:  ID  Oncology  PCCM   Procedures/Studies: Dg Chest 2 View  Result Date: 07/08/2016 CLINICAL DATA:  Shortness of breath and atrial fibrillation. EXAM: CHEST  2 VIEW COMPARISON:  06/27/2016 FINDINGS: Moderate bilateral pleural effusions are again noted, slightly increased on the left. Bilateral lower lung atelectasis, pulmonary vascular congestion and probable interstitial edema again noted. There is no evidence of pneumothorax. No other significant changes identified. IMPRESSION: Pulmonary vascular congestion and probable interstitial edema again noted. Moderate bilateral pleural effusions, slightly increased on the left. Continued bilateral lower lung atelectasis. Electronically Signed   By: Margarette Canada M.D.   On: 07/08/2016 20:11   Ct Chest Wo Contrast  Result Date: 07/25/2016 CLINICAL DATA:  Follow-up pleural effusion EXAM: CT CHEST WITHOUT CONTRAST TECHNIQUE: Multidetector CT imaging of the chest was performed following the standard protocol without IV contrast. COMPARISON:  07/17/2016 FINDINGS: Cardiovascular: Somewhat limited due to the lack of IV contrast. Diffuse aortic calcifications are noted without aneurysmal dilatation. Coronary calcifications are seen. No significant cardiac enlargement is noted. Mediastinum/Nodes: The thoracic inlet is within normal limits. Right peritracheal lymph node is noted measuring less than 10 mm in short axis. Scattered small mediastinal and hilar lymph nodes are seen likely reactive in nature. Lungs/Pleura: Bilateral pleural  effusions are noted right greater than left with air pockets similar to that seen on the prior exam. The previously seen chest tubes have been removed in the interval. Bilateral lower lobe consolidation is noted considerably greater on the right than the left. These have increased in the interval from the prior exam. Patchy changes are noted in the right upper lobe. Upper Abdomen: Stable from the previous exam. Musculoskeletal: Degenerative change of thoracic spine is noted. IMPRESSION: Stable appearing bilateral pleural effusions with air pockets within. There is been interval removal of chest tubes bilaterally. Bilateral infiltrates right greater than left increasing particularly in the right lower lobe. Electronically Signed   By: Inez Catalina M.D.   On: 07/25/2016 11:57   Ct Chest Wo Contrast  Result Date: 07/17/2016 CLINICAL DATA:  Right pleural effusion. EXAM: CT CHEST WITHOUT CONTRAST TECHNIQUE: Multidetector CT imaging of the chest was performed following the standard protocol without IV contrast. COMPARISON:  06/09/2016 FINDINGS: Cardiovascular: Heart is normal size. Coronary artery calcifications diffusely. Scattered aortic calcifications. No evidence of aortic aneurysm. Mediastinum/Nodes: No mediastinal, hilar, or axillary adenopathy. Endotracheal tube tip is in the mid trachea. Lungs/Pleura: There are moderate bilateral pleural effusions. Bilateral chest tubes are in place. Locules of pleural care noted bilaterally. The locules of gas are not fall anterior in location compatible with multiple loculations bilaterally, right worse than left. Airspace disease noted in the lower lobes bilaterally. Ground-glass nodular opacities throughout the right upper lung and to a lesser extent lingula. Findings concerning for pneumonia. Upper Abdomen: NG tube is in the stomach. No acute findings in the upper abdomen. Musculoskeletal: Chest wall soft tissues are unremarkable. No acute bony abnormality. IMPRESSION:  Partially loculated moderate bilateral hydropneumothoraces with bilateral chest tubes in place. Bilateral lower lobe airspace opacities. Ground-glass nodular opacities in the right upper lobe and lingula. Findings concerning for pneumonia. Coronary artery disease. Electronically  Signed   By: Rolm Baptise M.D.   On: 07/17/2016 14:39   Nm Liver Spleen Scan  Result Date: 07/31/2016 CLINICAL DATA:  ITP status post splenectomy 07/13/2016. Thrombocytopenia. EXAM: NUCLEAR MEDICINE LIVER - SPLEEN SCAN TECHNIQUE: Abdominal images were obtained in multiple projections after intravenous injection of radiopharmaceutical. RADIOPHARMACEUTICALS:  5.2 mCi Tc-68m sulfur colloid IV COMPARISON:  Preoperative CT 06/09/2016. FINDINGS: There is homogeneous activity throughout the liver. No residual splenic tissue identified. Low-level activity is present throughout the bone marrow. IMPRESSION: Normal examination status post splenectomy. No residual or recurrent splenic uptake identified. Electronically Signed   By: Richardean Sale M.D.   On: 07/31/2016 14:09   Dg Chest Port 1 View  Result Date: 07/30/2016 CLINICAL DATA:  Dyspnea, history of ITP with splenectomy 07/13/2016 EXAM: PORTABLE CHEST 1 VIEW COMPARISON:  07/27/2016 FINDINGS: Right-sided PICC line tip projects in the cavoatrial junction. The visualized heart and mediastinal contours are stable with aortic atherosclerosis. Small left and small to moderate right pleural effusions are again visualized with adjacent atelectasis. These appear without significant change. Small amount of extrapleural gas noted within the pleural space as before. There is small pulmonary vascular congestion with slightly low lung volumes as before. IMPRESSION: Persistent bilateral pleural effusions, small to moderate on the right and small on the left with adjacent atelectasis. Mild vascular congestion is seen. No significant change from prior. Electronically Signed   By: Ashley Royalty M.D.   On:  07/30/2016 20:18   Dg Chest Port 1 View  Result Date: 07/27/2016 CLINICAL DATA:  Pneumonia EXAM: PORTABLE CHEST 1 VIEW COMPARISON:  Chest x-ray of July 24 2016 and chest CT scan of July 25, 2016. FINDINGS: The lungs are mildly hypoinflated. Moderate-sized bilateral pleural effusions remain. There is some gas within the right pleural space and very early. Atelectasis or pneumonia at the right lung base is present with some atelectasis at the left lung base is well. The cardiac silhouette is not enlarged. The pulmonary vascularity is mildly prominent. The right sided PICC line tip projects over the midportion of the SVC. IMPRESSION: The patient is positioned in a lordotic matter today which accentuates the hypoinflation. Persistent bilateral pleural effusions with bibasilar atelectasis or pneumonia. There has not been dramatic change since 24 July 2016. Electronically Signed   By: David  Martinique M.D.   On: 07/27/2016 07:24   Dg Chest Port 1 View  Result Date: 07/24/2016 CLINICAL DATA:  Respiratory failure. EXAM: PORTABLE CHEST 1 VIEW COMPARISON:  07/23/2016. FINDINGS: Interim removal of bilateral chest 2. Right PICC line stable position. Cardiomegaly again noted. Persistent but improved bilateral pulmonary infiltrates. Persistent basilar atelectasis . Persistent bilateral pleural effusions, right side greater than left. Bilateral pleural effusions are again noted . IMPRESSION: 1. Interim removal of bilateral chest tubes. No pneumothorax. Right PICC line stable position. 2. Persistent bilateral pulmonary infiltrates and bilateral pleural effusions, improved slightly from prior exam. Persistent bibasilar atelectasis . Electronically Signed   By: Marcello Moores  Register   On: 07/24/2016 07:06   Dg Chest Port 1 View  Result Date: 07/23/2016 CLINICAL DATA:  Acute respiratory failure. COPD. Pleural effusions. EXAM: PORTABLE CHEST 1 VIEW COMPARISON:  07/22/2016. FINDINGS: No change cardiomediastinal silhouette.  Decreasing BILATERAL pleural effusions. Improved aeration of both bases. RIGHT remains worse than LEFT. BILATERAL chest tubes in good position. No pneumothorax. PICC line stable. Unchanged cardiomediastinal silhouette. IMPRESSION: Slight improvement aeration Electronically Signed   By: Staci Righter M.D.   On: 07/23/2016 07:15   Dg Chest Port 1  View  Result Date: 07/22/2016 CLINICAL DATA:  Pleural effusion EXAM: PORTABLE CHEST 1 VIEW COMPARISON:  July 21, 2016 FINDINGS: Central catheter tip is in the superior vena cava. There are chest tubes bilaterally. No pneumothorax. There are bilateral pleural effusions, stable. There is bibasilar atelectasis. No consolidation evident. Heart is upper normal in size with pulmonary vascularity within normal limits. No bone lesions. IMPRESSION: Tube and catheter positions as described without evident pneumothorax. Small pleural effusions and bibasilar atelectasis persist. No new opacity. Stable cardiac silhouette. Electronically Signed   By: Lowella Grip III M.D.   On: 07/22/2016 07:51   Dg Chest Port 1 View  Result Date: 07/21/2016 CLINICAL DATA:  Recent pneumonia EXAM: PORTABLE CHEST 1 VIEW COMPARISON:  July 20, 2016 FINDINGS: Endotracheal tube and nasogastric tube have been removed. Central catheter tip is in the superior vena cava near the cavoatrial junction. Chest tubes are noted bilaterally. There is no appreciable pneumothorax. There are small pleural effusions bilaterally, slightly larger on the left than on the right. There is mild bibasilar atelectasis. There is new patchy infiltrate in the right mid lung, concerning for early pneumonia in this area. Heart is mildly enlarged with pulmonary vascularity within normal limits. There is atherosclerotic calcification in the aorta. No adenopathy. No bone lesions. IMPRESSION: Patchy infiltrate right mid lung. Suspect early pneumonia. Small pleural effusions with bibasilar atelectasis present with chest tubes  present bilaterally. No pneumothorax. There is aortic atherosclerosis. No change in central catheter position. Electronically Signed   By: Lowella Grip III M.D.   On: 07/21/2016 07:16   Dg Chest Port 1 View  Result Date: 07/20/2016 CLINICAL DATA:  Follow-up pneumonia, check chest tube status EXAM: PORTABLE CHEST 1 VIEW COMPARISON:  07/19/2016 FINDINGS: Cardiac shadow is stable. An endotracheal tube, nasogastric catheter and right-sided PICC line are again seen and stable. Bilateral thoracostomy catheters are noted. No pneumothorax is seen. Minimal bibasilar atelectasis is noted. IMPRESSION: No significant change from the prior exam. Electronically Signed   By: Inez Catalina M.D.   On: 07/20/2016 07:00   Dg Chest Port 1 View  Result Date: 07/19/2016 CLINICAL DATA:  Hypoxia EXAM: PORTABLE CHEST 1 VIEW COMPARISON:  Chest radiograph and chest CT July 17, 2016 FINDINGS: Endotracheal tube tip is 3.3 cm above the carina. Central catheter tip is in the superior vena cava. Nasogastric tube tip and side port are in the stomach. There is a chest tube on the right. No evident pneumothorax. There is patchy atelectatic change in the left base. There is a small pleural effusion on the left. Pleural effusion on the right is smaller. No new opacity is identified. Heart is upper normal in size with pulmonary vascularity within normal limits. No adenopathy. No bone lesions. IMPRESSION: Tube and catheter positions as described without pneumothorax. Patchy atelectatic change noted in left base. Small pleural effusions bilaterally with effusion on the right smaller compared to 2 days prior. No new opacity evident. Stable cardiac silhouette. Electronically Signed   By: Lowella Grip III M.D.   On: 07/19/2016 08:12   Dg Chest Port 1 View  Result Date: 07/17/2016 CLINICAL DATA:  Endotracheal tube check EXAM: PORTABLE CHEST 1 VIEW COMPARISON:  07/16/2016 chest radiograph FINDINGS: Endotracheal tube tip is in unchanged  position just below the level of the clavicles and approximately 4.5 cm above the inferior margin of the carina. Right-sided PICC line tip is at the cavoatrial junction. There bilateral chest tubes in unchanged position. There are small bilateral pleural effusions and associated  atelectasis. No visualized pneumothorax. IMPRESSION: 1. Unchanged support apparatus. 2. Small bilateral pleural effusions and associated atelectasis. Electronically Signed   By: Ulyses Jarred M.D.   On: 07/17/2016 05:28   Dg Chest Port 1 View  Result Date: 07/16/2016 CLINICAL DATA:  Endotracheal tube placement. EXAM: PORTABLE CHEST 1 VIEW COMPARISON:  07/15/2016 FINDINGS: Endotracheal tube unchanged with tip 4.1 cm above the carina. The visualized portions of the nasogastric tube unchanged. Left basilar chest tube unchanged. Interval placement of right-sided PICC line with tip overlying the SVC. Right basilar chest tube unchanged. Lungs are adequately inflated demonstrate persistent hazy opacification over the mid to lower right lung and left base without significant change. Small bilateral pleural effusions right greater than left unchanged. Previous noted small bibasilar loculated pneumothoraces more difficult to visualize as there are smaller with more fluid density. Cardiomediastinal silhouette and remainder of the exam is unchanged. IMPRESSION: Stable hazy opacification over the mid to lower right lung and left base. Findings may be due to infection with small bilateral effusions right greater than left. Previously noted small bibasilar loculated pneumothoraces more difficult to visualize as there are smaller and more filled in with pleural fluid. Tubes and lines as described. Electronically Signed   By: Marin Olp M.D.   On: 07/16/2016 07:23   Dg Chest Port 1 View  Result Date: 07/15/2016 CLINICAL DATA:  Acute respiratory failure. EXAM: PORTABLE CHEST 1 VIEW COMPARISON:  07/14/2016 FINDINGS: Endotracheal tube with tip 4.3 cm  above the carina. Visualize nasogastric tube unchanged. Bibasilar chest tubes unchanged. Lungs are adequately inflated with continued improvement in small loculated bibasilar pneumothoraces. Persistent bibasilar opacification likely atelectasis with possible small amount of bilateral pleural fluid. Slight worsening hazy opacification over the right midlung which may be due to atelectasis or infection. Cardiomediastinal silhouette and remainder of the exam is unchanged. IMPRESSION: Slight worsening hazy opacification over the right midlung which may be due to atelectasis or infection. Persistent bibasilar opacification likely atelectasis with possible small bilateral pleural effusions. Bibasilar chest tubes in place with continued interval improvement of small loculated pneumothoraces. Remaining tubes and lines as described. Electronically Signed   By: Marin Olp M.D.   On: 07/15/2016 08:55   Dg Chest Port 1 View  Result Date: 07/14/2016 CLINICAL DATA:  Status post intubation. EXAM: PORTABLE CHEST 1 VIEW COMPARISON:  07/14/2016 at 12:10 p.m. FINDINGS: The endotracheal tube tip projects 3.6 cm above the carina, well positioned. New nasal/ orogastric tube passes below the diaphragm and below the included field of view, into the stomach. Bilateral lower hemithorax chest tubes are stable. The the home pneumothoraces noted in the lower lungs on the prior study have mildly decreased in size. No other change. IMPRESSION: 1. Well-positioned endotracheal tube. Nasal/orogastric tube passes below the diaphragm into the stomach, but below the included field of view. 2. Mild decrease in the size of the bilateral lower lung zone pneumothoraces. Electronically Signed   By: Lajean Manes M.D.   On: 07/14/2016 13:53   Dg Chest Port 1 View  Result Date: 07/14/2016 CLINICAL DATA:  Status post placement of bilateral chest tubes. EXAM: PORTABLE CHEST 1 VIEW COMPARISON:  07/14/2016 at 4:45 a.m. FINDINGS: Sincerely exam,  bilateral lower hemithorax chest tubes have been placed. On the right, a chest tube tip projects inferior to the right hilar structures. On the left, the chest tube tip projects over the mid to lower left mid hemithorax. There has been a significant decrease in pleural fluid. Loculated areas noted along the peripheral  right mid to lower lung, bb bordering consolidated, partly collapsed lung. On the left, there is a loculated pneumothorax at the lateral left mid to lower hemithorax. Hazy opacities noted in the partly collapsed lower left lung. There is no apical pneumothorax on either side. IMPRESSION: 1. Status post chest tube placement in both lower hemithoraces with significant decrease in pleural fluid. 2. There are now pneumothoraces in the mid to lower hemithoraces. On the right, this appears loculated. This is more simple in appearance on the left. The partly collapsed lower lungs are densely consolidated on the right with more hazy consolidation on the left, which may be atelectasis, edema or infection, or a combination. Electronically Signed   By: Lajean Manes M.D.   On: 07/14/2016 12:39   Portable Chest Xray  Result Date: 07/14/2016 CLINICAL DATA:  Respiratory failure. EXAM: PORTABLE CHEST 1 VIEW COMPARISON:  07/13/2016. FINDINGS: Interim extubation removal of NG tube. Heart size stable. Pulmonary venous congestion and bilateral pulmonary infiltrates with bilateral pleural effusions suggesting CHF. Bilateral pneumonia cannot be excluded. These findings unchanged prior exam. No pneumothorax. IMPRESSION: 1. Interim extubation removal of NG tube. 2. Pulmonary venous congestion and bilateral pulmonary infiltrates with bilateral pleural effusions again noted. Findings suggest congestive heart failure. Bilateral pneumonia cannot be excluded. No interim change from prior exam. Electronically Signed   By: Marcello Moores  Register   On: 07/14/2016 06:52   Portable Chest Xray  Result Date: 07/13/2016 CLINICAL DATA:   Respiratory failure.  Endotracheal tube placement. EXAM: PORTABLE CHEST 1 VIEW COMPARISON:  07/12/2016 and prior radiograph FINDINGS: An endotracheal tube is identified with tip 5 cm above the carina. An NG tube is present with tip difficult to visualize but appears to overlie the distal esophagus. Moderate bilateral pleural effusions, right greater than left, and bilateral lower lung atelectasis/ airspace disease again noted. Pulmonary vascular congestion is present. There is no evidence of pneumothorax. IMPRESSION: Support apparatus as described. NG tube tip difficult to visualize but appears to overlie the lower esophagus. Moderate pleural effusions and bilateral lower lung atelectasis/ airspace disease again noted. Electronically Signed   By: Margarette Canada M.D.   On: 07/13/2016 17:39   Dg Chest Port 1 View  Result Date: 07/12/2016 CLINICAL DATA:  Status post left thoracentesis EXAM: PORTABLE CHEST 1 VIEW COMPARISON:  07/11/2016 FINDINGS: Cardiac shadow is stable. Bilateral pleural effusions are again seen. No pneumothorax is noted. Slight reduction in pleural fluid is noted on the left. No bony abnormality is noted. IMPRESSION: Bilateral pleural effusions with slight reduction on the left. No pneumothorax is noted. Electronically Signed   By: Inez Catalina M.D.   On: 07/12/2016 11:17   Dg Chest Port 1 View  Result Date: 07/11/2016 CLINICAL DATA:  Follow-up pleural effusions EXAM: PORTABLE CHEST 1 VIEW COMPARISON:  PA and lateral chest x-ray of July 08, 2016 FINDINGS: There remain moderate-sized bilateral pleural effusions. The hemidiaphragms are obscured. The lower aspects of the heart border are obscured. The pulmonary vascularity is mildly engorged. There is calcification in the wall of the aortic arch. The trachea is midline. IMPRESSION: Persistent moderate-sized bilateral pleural effusions. Mild pulmonary vascular congestion consistent with low-grade CHF. Thoracic aortic atherosclerosis. Electronically  Signed   By: David  Martinique M.D.   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 Bilateral pleural effusions noted on chest x-ray. Patient to undergo splenectomy tomorrow and having some dyspnea.  Request was made for bilateral thoracenteses. EXAM: ULTRASOUND GUIDED THERAPEUTIC LEFT THORACENTESIS MEDICATIONS: 1% lidocaine. COMPLICATIONS: None immediate. PROCEDURE: An ultrasound guided thoracentesis was thoroughly discussed with the patient and questions answered. The benefits, risks, alternatives and complications were also discussed. The patient understands and wishes to proceed with the procedure. Written consent was obtained. Ultrasound was performed to localize and mark an adequate pocket of fluid in the left chest. The area was then prepped and draped in the normal sterile fashion. 1% Lidocaine was used for local anesthesia. Under ultrasound guidance a Safe-T-Centesis catheter was introduced. Thoracentesis was performed on the left side. It was unable to be completely drained dry secondary to hypotension. The catheter was removed and a dressing applied. FINDINGS: A total of approximately 1.2 L of bloody fluid was removed. IMPRESSION: Successful ultrasound guided left thoracentesis yielding 1.2 L of pleural fluid. I discussed this case with Dr. Barry Dienes who is the ordering physician. Unfortunately, due to persistent hypotension the procedure was terminated early on the left and unable to do the right-sided at all. She was agreeable with terminating in the procedure at this time. Read by: Saverio Danker, PA-C Electronically Signed   By: Aletta Edouard M.D.   On: 07/12/2016 11:20    Discharge Exam: Vitals:   08/02/16 0800 08/02/16 1441  BP: (!) 90/50 (!) 110/59  Pulse: 68 93  Resp:  (!) 22  Temp:  98.7 F (37.1 C)   Vitals:   08/01/16 2041 08/02/16 0518 08/02/16 0800 08/02/16  1441  BP:  112/62 (!) 90/50 (!) 110/59  Pulse:  82 68 93  Resp:    (!) 22  Temp:  98.5 F (36.9 C)  98.7 F (37.1 C)  TempSrc:  Oral  Oral  SpO2: 93% 97%  97%  Weight:  83.7 kg (184 lb 8.4 oz)    Height:        General: Pt is alert, awake, not in acute distress,  Cardiovascular: RRR, S1/S2 +, no rubs, no gallops Respiratory: Good air entry, b/l coarse sounds Abdominal: Soft, NT, ND, bowel sounds + Extremities: Mild lower extremity edema, no cyanosis   The results of significant diagnostics from this hospitalization (including imaging, microbiology, ancillary and laboratory) are listed below for reference.     Microbiology: No results found for this or any previous visit (from the past 240 hour(s)).   Labs: BNP (last 3 results)  Recent Labs  06/10/16 1107 07/08/16 2308 07/15/16 0900  BNP 109.9* 113.8* 38.1   Basic Metabolic Panel:  Recent Labs Lab 07/29/16 0418 07/30/16 0329 07/31/16 0349 08/01/16 0425 08/01/16 0847 08/02/16 0432  NA 138 139 136  --  134* 135  K 3.5 3.2* 4.3  --  4.6 3.8  CL 88* 87* 89*  --  89* 90*  CO2 43* 45* 42*  --  39* 41*  GLUCOSE 179* 181* 193*  --  224* 156*  BUN 11 10 12   --  10 11  CREATININE 0.61 0.51* 0.59* 0.54* 0.57* 0.56*  CALCIUM 8.2* 8.3* 8.2*  --  8.5* 8.1*  MG 1.7 1.6* 1.7  --  1.6* 1.8  PHOS 2.4* 2.9 2.6  --  3.0 2.6   Liver Function Tests:  Recent Labs Lab 07/29/16 0418 07/30/16 0329 07/31/16 0349 08/01/16 0847 08/02/16 0432  AST 23 18 20 28 24   ALT 20 18 19 21 20   ALKPHOS 99 97 93 107 96  BILITOT 0.5 0.4 0.4 0.5 0.2*  PROT 5.2* 5.3* 5.4* 6.1* 5.3*  ALBUMIN  1.9* 2.0* 1.9* 2.2* 1.9*   No results for input(s): LIPASE, AMYLASE in the last 168 hours. No results for input(s): AMMONIA in the last 168 hours. CBC:  Recent Labs Lab 07/29/16 0418 07/30/16 0329 07/31/16 0349 08/01/16 0847 08/02/16 0432  WBC 12.1* 11.6* 12.8* 12.5* 13.8*  NEUTROABS 9.1* 8.6* 9.1* 9.3* 9.8*  HGB 8.7* 8.9* 8.7* 9.5* 8.7*   HCT 27.5* 28.7* 27.7* 31.2* 28.1*  MCV 87.9 87.8 87.1 87.9 87.3  PLT 17* 14* 5* 11* 10*   Cardiac Enzymes: No results for input(s): CKTOTAL, CKMB, CKMBINDEX, TROPONINI in the last 168 hours. BNP: Invalid input(s): POCBNP CBG:  Recent Labs Lab 08/01/16 1141 08/01/16 1752 08/01/16 2100 08/02/16 0832 08/02/16 1213  GLUCAP 188* 254* 121* 231* 217*   D-Dimer No results for input(s): DDIMER in the last 72 hours. Hgb A1c No results for input(s): HGBA1C in the last 72 hours. Lipid Profile No results for input(s): CHOL, HDL, LDLCALC, TRIG, CHOLHDL, LDLDIRECT in the last 72 hours. Thyroid function studies No results for input(s): TSH, T4TOTAL, T3FREE, THYROIDAB in the last 72 hours.  Invalid input(s): FREET3 Anemia work up No results for input(s): VITAMINB12, FOLATE, FERRITIN, TIBC, IRON, RETICCTPCT in the last 72 hours. Urinalysis    Component Value Date/Time   COLORURINE YELLOW 07/15/2016 0547   APPEARANCEUR TURBID (A) 07/15/2016 0547   LABSPEC 1.025 07/15/2016 0547   PHURINE 5.0 07/15/2016 0547   GLUCOSEU >=500 (A) 07/15/2016 0547   HGBUR SMALL (A) 07/15/2016 0547   BILIRUBINUR NEGATIVE 07/15/2016 0547   KETONESUR 20 (A) 07/15/2016 0547   PROTEINUR 100 (A) 07/15/2016 0547   UROBILINOGEN 1.0 11/22/2008 1922   NITRITE NEGATIVE 07/15/2016 0547   LEUKOCYTESUR NEGATIVE 07/15/2016 0547   Sepsis Labs Invalid input(s): PROCALCITONIN,  WBC,  LACTICIDVEN Microbiology No results found for this or any previous visit (from the past 240 hour(s)).   Time coordinating discharge: 40 minutes  SIGNED:  Chipper Oman, MD  [facility] 08/02/2016, 2:55 PM  Pager please text page via  [email or web address] Password [email or web address]

## 2016-08-03 NOTE — Progress Notes (Signed)
Discharge instructions reviewed with patient and spouse. Questions, concerns denied. Heart failure packet given to assist with continued plan of care.

## 2016-08-03 NOTE — Progress Notes (Signed)
[facility] representative reached. Spoke to Portal whom stated they were not aware of patients arrival and would not be able to accept patients admission. Requested to speak with supervisor and or facility liaison number received [phone] Tammy. After speaking with Tammy she assured RN that pt would be able to transfer tonight.

## 2016-08-04 ENCOUNTER — Encounter: Payer: Self-pay | Admitting: Adult Health

## 2016-08-04 ENCOUNTER — Non-Acute Institutional Stay (SKILLED_NURSING_FACILITY): Payer: Medicare HMO | Admitting: Adult Health

## 2016-08-04 DIAGNOSIS — D638 Anemia in other chronic diseases classified elsewhere: Secondary | ICD-10-CM | POA: Diagnosis not present

## 2016-08-04 DIAGNOSIS — I5033 Acute on chronic diastolic (congestive) heart failure: Secondary | ICD-10-CM

## 2016-08-04 DIAGNOSIS — I471 Supraventricular tachycardia: Secondary | ICD-10-CM

## 2016-08-04 DIAGNOSIS — R29898 Other symptoms and signs involving the musculoskeletal system: Secondary | ICD-10-CM

## 2016-08-04 DIAGNOSIS — D693 Immune thrombocytopenic purpura: Secondary | ICD-10-CM

## 2016-08-04 DIAGNOSIS — E119 Type 2 diabetes mellitus without complications: Secondary | ICD-10-CM | POA: Diagnosis not present

## 2016-08-04 DIAGNOSIS — Z794 Long term (current) use of insulin: Secondary | ICD-10-CM

## 2016-08-04 DIAGNOSIS — K219 Gastro-esophageal reflux disease without esophagitis: Secondary | ICD-10-CM | POA: Diagnosis not present

## 2016-08-04 DIAGNOSIS — I110 Hypertensive heart disease with heart failure: Secondary | ICD-10-CM | POA: Diagnosis not present

## 2016-08-04 DIAGNOSIS — Z9889 Other specified postprocedural states: Secondary | ICD-10-CM | POA: Diagnosis not present

## 2016-08-04 DIAGNOSIS — E43 Unspecified severe protein-calorie malnutrition: Secondary | ICD-10-CM | POA: Diagnosis not present

## 2016-08-04 DIAGNOSIS — J449 Chronic obstructive pulmonary disease, unspecified: Secondary | ICD-10-CM

## 2016-08-04 NOTE — Progress Notes (Signed)
Location:   [place] Room Number: 118 Place of Service:  SNF ([id number])  Ok Edwards NP  CODE STATUS: FULL CODE  Allergies  Allergen Reactions  . Heparin Other (See Comments)    Heparin-induced thrombocytopenia/thrombosis    Chief Complaint  Patient presents with  . Hospitalization Follow-up    HPI:  He has had been hospitalized for pleural effusions. 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 He had a left thoracentesis on 07-12-16. He had a chest tube placed; which has been removed. He was hospitalized for respiratory failure with pneumonia and is now on 02.  He is here for short term rehab with his goal to return back home. He does complain of being weak; no energy. There are no nursing concerns at this time.    Past Medical History:  Diagnosis Date  . Adenomatous colon polyp    01/2006  . Chronic pansinusitis   . COPD (chronic obstructive pulmonary disease) ([place])   . Diverticula of colon   . History of ITP   . History of nephrolithiasis   . Hyperlipemia   . Hypertension   . 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 Pleural effusion   . Sinus drainage    Cough with post nasal drip  . Thrombocytopenia ([place])   . Type 2 diabetes mellitus ([place])     Past Surgical History:  Procedure Laterality Date  . COLONOSCOPY    . ESOPHAGOGASTRODUODENOSCOPY    . ESOPHAGOGASTRODUODENOSCOPY  05/19/2011   Procedure: ESOPHAGOGASTRODUODENOSCOPY (EGD);  Surgeon: Wonda Horner, MD;  Location: [facility];  Service: Endoscopy;  Laterality: N/A;  stat CBC  APC also needed  . HOT HEMOSTASIS  05/19/2011   Procedure: HOT HEMOSTASIS (ARGON PLASMA COAGULATION/BICAP);  Surgeon: Wonda Horner, MD;  Location: [facility];  Service: Endoscopy;  Laterality: N/A;  . LAPAROSCOPIC SPLENECTOMY N/A 07/13/2016   Procedure: LAPAROSCOPIC SPLENECTOMY;  Surgeon: Stark Klein, MD;  Location: [facility];  Service: General;   Laterality: N/A;  . Spinal cyst resection      Social History   Social History  . Marital status: Married    Spouse name: N/A  . Number of children: N/A  . Years of education: N/A   Occupational History  . Not on file.   Social History Main Topics  . Smoking status: Former Smoker    Packs/day: 1.00    Years: 42.00    Types: Cigarettes    Start date: 05/02/1959    Quit date: 05/01/2001  . Smokeless tobacco: Never Used  . Alcohol use Yes     Comment: social  . Drug use: No  . Sexual activity: Not on file   Other Topics Concern  . 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 Denies any pets currently. No bird or mold exposure. No recent travel.         Family History  Problem Relation Age of Onset  . Colon cancer Mother   . Heart disease Mother   . Liver cancer Father   . Heart disease Father       VITAL SIGNS BP (!) 95/44   Pulse 94   Temp (!) 96.8 F (36 C)   Resp 20   Ht 5\' 11"  (1.803 m)   Wt 184 lb (83.5 kg)   BMI 25.66 kg/m  Patient's Medications  New Prescriptions   No medications on file  Previous Medications   ALBUTEROL (PROAIR HFA) 108 (90 BASE) MCG/ACT INHALER    2 puffs every 4 hours as needed only  if your can't catch your breath   AMINOCAPROIC ACID (AMICAR) 500 MG TABLET    Take 2 tablets (1,000 mg total) by mouth every 8 (eight) hours.   BUDESONIDE (PULMICORT) 0.5 MG/2ML NEBULIZER SOLUTION    Take 2 mLs (0.5 mg total) by nebulization 2 (two) times daily.   CARVEDILOL (COREG)  6.25 mg TABLET    Take 6.25 mg twice daily    CHLORPHENIRAMINE-HYDROCODONE (TUSSIONEX) 10-8 MG/5ML SUER    Take 5 mLs by mouth at bedtime as needed for cough.   CYCLOPHOSPHAMIDE (CYTOXAN) 50 MG TABLET    Take 3 tablets (150 mg total) by mouth daily. Give on an empty stomach 1 hour before or 2 hours after meals.   ECHINACEA EXTRACT PO    Take 1 capsule by mouth daily.   FAMOTIDINE (PEPCID) 20  MG TABLET    Take 20 mg by mouth at bedtime.   FEXOFENADINE (ALLEGRA) 180 MG TABLET    Take 180 mg by mouth daily.    FUROSEMIDE (LASIX) 20 MG TABLET    Take 3 tablets (60 mg total) by mouth daily.   GINKGO BILOBA PO    Take 1 tablet by mouth daily.    GLUCOSAMINE-CHONDROIT-VIT C-MN (GLUCOSAMINE 1500 COMPLEX PO)    Take 1,500 mg by mouth 2 (two) times daily.   INSULIN GLARGINE (TOUJEO SOLOSTAR) 300 UNIT/ML SOPN    Inject 30 Units into the skin daily.   IPRATROPIUM (ATROVENT) 0.02 % NEBULIZER SOLUTION    Take 2.5 mLs (0.5 mg total) by nebulization 3 (three) times daily.   LACTOSE FREE NUTRITION (BOOST PLUS) LIQD    Take 237 mLs by mouth 3 (three) times daily between meals.   LOSARTAN (COZAAR) 50 MG TABLET    Take 1 tablet (50 mg total) by mouth daily.   MAGNESIUM OXIDE 200 MG TABS    Take 1 tablet by mouth daily.   METFORMIN (GLUCOPHAGE) 1000 MG TABLET    Take 1,000 mg by mouth 2 (two) times daily with a meal.    MULTIPLE VITAMINS-MINERALS (MULTIVITAMIN PO)    Take 1 tablet by mouth daily.    PANTOPRAZOLE (PROTONIX) 40 MG TABLET    Take 1 tablet (40 mg total) by mouth daily. Take 30-60 min before first meal of the day   PREDNISONE (DELTASONE) 10 MG TABLET    Take 1 tablet (10 mg total) by mouth daily with breakfast.   RESPIRATORY THERAPY SUPPLIES (FLUTTER) DEVI    Use as directed   ROMIPLOSTIM (NPLATE) 250 MCG INJECTION    Inject 0.17 mLs (85 mcg total) into the skin once a week.   SIMVASTATIN (ZOCOR) 80 MG TABLET    Take 80 mg by mouth daily.   Modified Medications   No medications on file  Discontinued Medications   CARVEDILOL (COREG) 3.125 MG TABLET    Take 1 tablet (3.125 mg total) by mouth once.   FAMOTIDINE (PEPCID) 20 MG TABLET    One at bedtime   MAGNESIUM OXIDE (MAG-OX) 400 (241.3 MG) MG TABLET    Take 0.5 tablets (200 mg total) by mouth daily.     SIGNIFICANT DIAGNOSTIC EXAMS  07-08-16: chest x-ray: Pulmonary vascular congestion and probable interstitial edema again noted.  Moderate bilateral pleural effusions, slightly increased on the left. Continued bilateral lower lung  atelectasis.  07-09-16: 2-d echo: - Overall limited images. Mild LVH with LVEF 60-65%. Probable grade 2 diastolic dysfunction. Calcified mitral annulus with trivial  mitral regurgitation. Aortic valve is moderately calcified and not well seen. Right ventricle appears mildly dilated. Trivial  tricuspid regurgitation. Right pleural effusion is evident.   07-12-16: left thoracentesis: Successful ultrasound guided left thoracentesis yielding 1.2 L of pleural fluid.   07-13-16: chest x-ray: Support apparatus as described. NG tube tip difficult to visualize but appears to overlie the lower esophagus. Moderate pleural effusions and bilateral lower lung atelectasis/airspace disease again noted.  07-17-16: ct of chest: Partially loculated moderate bilateral hydropneumothoraces with bilateral chest tubes in place. Bilateral lower lobe airspace opacities. Ground-glass nodular opacities in the right upper lobe and lingula. Findings concerning for pneumonia. Coronary artery disease.   07-25-16: ct of chest: Stable appearing bilateral pleural effusions with air pockets within. There is been interval removal of chest tubes bilaterally. Bilateral infiltrates right greater than left increasing particularly in the right lower lobe.   07-31-16: NM liver-spleen: Normal examination status post splenectomy. No residual or recurrent splenic uptake identified.   LABS REVIEWED:   07-08-16: wbc 9.1; hgb 11.2; hct 34.6; mcv 88.;3 plt <5; glucose 206; bun 32; creat 1.23; k+ 3.7; na++ 134; ca 8.3; alt 13; albumin 2.7 07-09-16; tsh 5.003; free T 4: 1.39; chol 126; ldl 63; trig 110; hdl 41; mag 1.0; pre-albumin 10.1 07-15-16: wbc 12.5; hgb 10.5; hct 32.9; mcv 86.1; plt 53; glucose 250; bun 25; creat 1.04; k+ 3.3; na++ 138; ca 8.1 07-23-16: wbc 12.7; hgb 9.5; hct 31.3; mcv 89.4; plt 266 glucose 193; bun 36; creat 0.80; k+ 3.9; na++  142; mag 1.7; phos 2.5  08-02-16: wbc 13.8; hgb 8.7; hct 28.1; mcv 87.3; plt 10; glucose 156; bun 11; creat 0.56; k+ 3.8; na++ 135; total bili 0.2; albumin 1.9; mag 1.8; phos 2.6   Review of Systems  Constitutional: Positive for malaise/fatigue.  Respiratory: Positive for cough and shortness of breath.        Is on 02 at 3L.  Is using flutter valve and I/S.   Cardiovascular: Negative for chest pain, palpitations and leg swelling.  Gastrointestinal: Negative for abdominal pain, constipation and heartburn.  Musculoskeletal: Negative for back pain, joint pain and myalgias.  Skin: Negative.   Neurological: Positive for weakness. Negative for dizziness.  Psychiatric/Behavioral: The patient is not nervous/anxious.      Physical Exam  Constitutional: He is oriented to person, place, and time. No distress.  Eyes: Conjunctivae are normal.  Neck: Neck supple. No JVD present. No thyromegaly present.  Cardiovascular: Normal rate, regular rhythm and intact distal pulses.   Respiratory: Effort normal. No respiratory distress. He has no wheezes.  Is on 02 at 3L Breath sounds diminished in bilateral bases   GI: Soft. Bowel sounds are normal. He exhibits no distension. There is no tenderness.  Musculoskeletal: He exhibits edema.  Able to move all extremities  Has 1+ bilateral lower extremity edema   Lymphadenopathy:    He has no cervical adenopathy.  Neurological: He is alert and oriented to person, place, and time.  Skin: Skin is warm and dry. He is not diaphoretic.  Psychiatric: He has a normal mood and affect.     ASSESSMENT/ PLAN:  1. Acute on chronic diastolic heart failure: EF 60-65% (07-09-16): will continue lasix 60 mg daily   2. SVT: will continue coreg 6.25 mg twice daily and will monitor   3. COPD: has pleural effusion: has  obstructive bronchiectasis: is on 02 at [place]. Will continue pulmicort neb treatment twice daily atrovent neb treatment three times daily albuterol 2 puffs  every 4 hours as needed; has tussionex 5 mL as needed at hs for cough will continue flutter valve and I/S.   4. Allergic rhinitis: will continue allegra 180 mg daily   5. Hypertension will continue coreg 6.25 mg twice daily and will hold for systolic b/p <435 and will lower cozaar to 25 mg daily   6.  Diabetes: will continue metformin 1 gm twice daily will change toujeo to 15 units nightly and will continue to monitor his status.   7. Jerrye Bushy: will continue protonix 40 mg in the AM and pepcid 20 mg in the PM  8. Dyslipidemia: ldl is 63; will continue zocor 80 mg nightly   9. Chronic ITP status post splenectomy (07-13-16): is followed by oncology: will continue prednisone 10 mg daily; amicar 1 gm every 8 hours; and is receiving romiplostim 250 mcg weekly at the cancer center  10. Bilateral pleural effusions: is status post thoracentesis on 07-12-16. Will continue flutter valve and I/S.   11. Anemia: hgb is 8.7; will monitor   12. Severe protein -calorie malnutrition: albumin 1.9; will continue supplements per facility protocol and will monitor   13. Physical deconditioning: will continue therapy as directed to improve upon gait; balance; strength and ald training and will monitor his status.    Will need to follow up with cardiology in and pulmonology in the next 1-2 weeks  will need chest x-ray in 3-4 weeks Will need to follow up with oncology on 08-06-16 for Nplate injection Will check cbc; cmp  MD is aware of resident's narcotic use and is in agreement with current plan of care. We will attempt to wean resident as apropriate   Time spent with patient  55  minutes >50% time spent counseling; reviewing medical record; tests; labs; and developing future plan of care   Ok Edwards NP [facility]  Contact [phone] Monday through Friday 8am- 5pm  After hours call [phone]

## 2016-08-07 ENCOUNTER — Telehealth: Payer: Self-pay | Admitting: Hematology and Oncology

## 2016-08-07 ENCOUNTER — Ambulatory Visit (HOSPITAL_BASED_OUTPATIENT_CLINIC_OR_DEPARTMENT_OTHER): Payer: Medicare HMO | Admitting: Adult Health

## 2016-08-07 ENCOUNTER — Other Ambulatory Visit: Payer: Medicare HMO

## 2016-08-07 ENCOUNTER — Other Ambulatory Visit: Payer: Self-pay | Admitting: Hematology and Oncology

## 2016-08-07 VITALS — BP 111/48 | HR 92 | Temp 98.1°F | Resp 18 | Ht 71.0 in | Wt 181.5 lb

## 2016-08-07 DIAGNOSIS — D693 Immune thrombocytopenic purpura: Secondary | ICD-10-CM

## 2016-08-07 DIAGNOSIS — I959 Hypotension, unspecified: Secondary | ICD-10-CM

## 2016-08-07 DIAGNOSIS — R0602 Shortness of breath: Secondary | ICD-10-CM | POA: Diagnosis not present

## 2016-08-07 LAB — CBC WITH DIFFERENTIAL/PLATELET
BASO%: 0.9 % (ref 0.0–2.0)
Basophils Absolute: 0.1 10*3/uL (ref 0.0–0.1)
EOS%: 0.8 % (ref 0.0–7.0)
Eosinophils Absolute: 0.1 10*3/uL (ref 0.0–0.5)
HEMATOCRIT: 31.1 % — AB (ref 38.4–49.9)
HGB: 9.7 g/dL — ABNORMAL LOW (ref 13.0–17.1)
LYMPH#: 2.2 10*3/uL (ref 0.9–3.3)
LYMPH%: 15.4 % (ref 14.0–49.0)
MCH: 26.3 pg — ABNORMAL LOW (ref 27.2–33.4)
MCHC: 31.2 g/dL — AB (ref 32.0–36.0)
MCV: 84.4 fL (ref 79.3–98.0)
MONO#: 1.1 10*3/uL — AB (ref 0.1–0.9)
MONO%: 8.1 % (ref 0.0–14.0)
NEUT#: 10.5 10*3/uL — ABNORMAL HIGH (ref 1.5–6.5)
NEUT%: 74.8 % (ref 39.0–75.0)
PLATELETS: 54 10*3/uL — AB (ref 140–400)
RBC: 3.68 10*6/uL — ABNORMAL LOW (ref 4.20–5.82)
RDW: 17.6 % — ABNORMAL HIGH (ref 11.0–14.6)
WBC: 14 10*3/uL — ABNORMAL HIGH (ref 4.0–10.3)

## 2016-08-07 MED ORDER — ROMIPLOSTIM 250 MCG ~~LOC~~ SOLR
1.0000 ug/kg | Freq: Once | SUBCUTANEOUS | Status: AC
  Administered 2016-08-07: 80 ug via SUBCUTANEOUS
  Filled 2016-08-07: qty 0.16

## 2016-08-07 NOTE — Patient Instructions (Signed)
Romiplostim injection What is this medicine? ROMIPLOSTIM (roe mi PLOE stim) helps your body make more platelets. This medicine is used to treat low platelets caused by chronic idiopathic thrombocytopenic purpura (ITP). This medicine may be used for other purposes; ask your health care provider or pharmacist if you have questions. COMMON BRAND NAME(S): Nplate What should I tell my health care provider before I take this medicine? They need to know if you have any of these conditions: -cancer or myelodysplastic syndrome -low blood counts, like low white cell, platelet, or red cell counts -take medicines that treat or prevent blood clots -an unusual or allergic reaction to romiplostim, mannitol, other medicines, foods, dyes, or preservatives -pregnant or trying to get pregnant -breast-feeding How should I use this medicine? This medicine is for injection under the skin. It is given by a health care professional in a hospital or clinic setting. A special MedGuide will be given to you before your injection. Read this information carefully each time. Talk to your pediatrician regarding the use of this medicine in children. Special care may be needed. Overdosage: If you think you have taken too much of this medicine contact a poison control center or emergency room at once. NOTE: This medicine is only for you. Do not share this medicine with others. What if I miss a dose? It is important not to miss your dose. Call your doctor or health care professional if you are unable to keep an appointment. What may interact with this medicine? Interactions are not expected. This list may not describe all possible interactions. Give your health care provider a list of all the medicines, herbs, non-prescription drugs, or dietary supplements you use. Also tell them if you smoke, drink alcohol, or use illegal drugs. Some items may interact with your medicine. What should I watch for while using this  medicine? Your condition will be monitored carefully while you are receiving this medicine. Visit your prescriber or health care professional for regular checks on your progress and for the needed blood tests. It is important to keep all appointments. What side effects may I notice from receiving this medicine? Side effects that you should report to your doctor or health care professional as soon as possible: -allergic reactions like skin rash, itching or hives, swelling of the face, lips, or tongue -shortness of breath, chest pain, swelling in a leg -unusual bleeding or bruising Side effects that usually do not require medical attention (report to your doctor or health care professional if they continue or are bothersome): -dizziness -headache -muscle aches -pain in arms and legs -stomach pain -trouble sleeping This list may not describe all possible side effects. Call your doctor for medical advice about side effects. You may report side effects to FDA at 1-800-FDA-1088. Where should I keep my medicine? This drug is given in a hospital or clinic and will not be stored at home. NOTE: This sheet is a summary. It may not cover all possible information. If you have questions about this medicine, talk to your doctor, pharmacist, or health care provider.  2018 Elsevier/Gold Standard (2007-12-16 15:13:04)  

## 2016-08-07 NOTE — Telephone Encounter (Signed)
Gave patient wife avs report and appointments for April.

## 2016-08-07 NOTE — Progress Notes (Signed)
Patient Care Team: Christain Sacramento, MD as PCP - General (Family Medicine)  DIAGNOSIS:  Encounter Diagnoses  Name Primary?  . 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 Acute ITP ([facility])    CHIEF COMPLIANT: Hypotension, nosebleeds  INTERVAL HISTORY: [name] is a 76 year old with above-mentioned history of ITP who has failed prednisone, IVIG, Rituxan, Cytoxan, and splenectomy.  He was discharged from the hospital last week following a multiple week complicated hospital stay requiring mechanical ventilation, chest tube placement, antibiotics, splenectomy.  He is slowly recovering.  He was started on Romiplostim prior to his discharge.  His platelets at discharge were 10.  Today, they are 54.  He is currently living at [facility] in a private room.  So far he has been doing physical therapy two to three times per week.  He has not yet started working with occupational therapy.  His main concern has been decreased stamina and shortness of breath.  He gets short of breath with everything.  He has difficulty eating more than two bites of food, talking, or really anything else.  He does bruise easily, however denies any easy bleeding from the mouth, nose, or in his urine or stool.    REVIEW OF SYSTEMS:   Review of Systems  Constitutional: Positive for malaise/fatigue. Negative for chills, fever and weight loss.  HENT: Negative for congestion, hearing loss and tinnitus.   Eyes: Negative for blurred vision and double vision.  Respiratory: Positive for cough, sputum production and shortness of breath. Negative for hemoptysis and wheezing.   Cardiovascular: Positive for leg swelling. Negative for chest pain and palpitations.  Gastrointestinal: Negative for blood in stool, constipation, heartburn, melena, nausea and vomiting.  Neurological: Positive for weakness. Negative for dizziness, tingling and headaches.  Endo/Heme/Allergies: Negative for environmental  allergies.    All other systems were reviewed with the patient and are negative.  I have reviewed the past medical history, past surgical history, social history and family history with the patient and they are unchanged from previous note.  ALLERGIES:  is allergic to heparin.  MEDICATIONS:  Current Outpatient Prescriptions  Medication Sig Dispense Refill  . albuterol (PROAIR HFA) 108 (90 Base) MCG/ACT inhaler 2 puffs every 4 hours as needed only  if your can't catch your breath 1 Inhaler 11  . aminocaproic acid (AMICAR) 500 MG tablet Take 2 tablets (1,000 mg total) by mouth every 8 (eight) hours.    . budesonide (PULMICORT) 0.5 MG/2ML nebulizer solution Take 2 mLs (0.5 mg total) by nebulization 2 (two) times daily. 220 mL 0  . carvedilol (COREG) 6.25 MG tablet Take 1 tablet (6.25 mg total) by mouth 2 (two) times daily with a meal.    . chlorpheniramine-HYDROcodone (TUSSIONEX) 10-8 MG/5ML SUER Take 5 mLs by mouth at bedtime as needed for cough. 115 mL 0  . cyclophosphamide (CYTOXAN) 50 MG tablet Take 3 tablets (150 mg total) by mouth daily. Give on an empty stomach 1 hour before or 2 hours after meals. 90 tablet 1  . ECHINACEA EXTRACT PO Take 1 capsule by mouth daily.    . famotidine (PEPCID) 20 MG tablet Take 20 mg by mouth at bedtime.    . fexofenadine (ALLEGRA) 180 MG tablet Take 180 mg by mouth daily.     . furosemide (LASIX) 20 MG tablet Take 3 tablets (60 mg total) by mouth daily.    Marland Kitchen GINKGO BILOBA PO Take 1 tablet by mouth daily.     Marland Kitchen  Glucosamine-Chondroit-Vit C-Mn (GLUCOSAMINE 1500 COMPLEX PO) Take 1,500 mg by mouth 2 (two) times daily.    . Insulin Glargine (TOUJEO SOLOSTAR) 300 UNIT/ML SOPN Inject 30 Units into the skin daily. 1 pen   . ipratropium (ATROVENT) 0.02 % nebulizer solution Take 2.5 mLs (0.5 mg total) by nebulization 3 (three) times daily. 75 mL 12  . lactose free nutrition (BOOST PLUS) LIQD Take 237 mLs by mouth 3 (three) times daily between meals.  0  . losartan  (COZAAR) 50 MG tablet Take 1 tablet (50 mg total) by mouth daily. 30 tablet 0  . Magnesium Oxide 200 MG TABS Take 1 tablet by mouth daily.    . metFORMIN (GLUCOPHAGE) 1000 MG tablet Take 1,000 mg by mouth 2 (two) times daily with a meal.     . Multiple Vitamins-Minerals (MULTIVITAMIN PO) Take 1 tablet by mouth daily.     . pantoprazole (PROTONIX) 40 MG tablet Take 1 tablet (40 mg total) by mouth daily. Take 30-60 min before first meal of the day 30 tablet 2  . predniSONE (DELTASONE) 10 MG tablet Take 1 tablet (10 mg total) by mouth daily with breakfast. 30 tablet 0  . Respiratory Therapy Supplies (FLUTTER) DEVI Use as directed 1 each 0  . romiPLOStim (NPLATE) 250 MCG injection Inject 0.17 mLs (85 mcg total) into the skin once a week. 1 each   . simvastatin (ZOCOR) 80 MG tablet Take 80 mg by mouth daily.      No current facility-administered medications for this visit.     PHYSICAL EXAMINATION: ECOG PERFORMANCE STATUS: 1 - Symptomatic but completely ambulatory  Vitals:   08/07/16 1029  BP: (!) 111/48  Pulse: 92  Resp: 18  Temp: 98.1 F (36.7 C)   Filed Weights   08/07/16 1029  Weight: 181 lb 8 oz (82.3 kg)   GENERAL: Patient is a chronically ill appearing older male sitting up in wheelchair wearing oxygen and coughing, he appears fatigued and short of breath HEENT:  Sclerae anicteric. PERRL,  Oropharynx clear and moist. No ulcerations or evidence of oropharyngeal candidiasis. Neck is supple.  NODES:  No cervical, supraclavicular, or axillary lymphadenopathy palpated.  LUNGS:  Diminished in bases bilaterally, crackles on right more than left HEART:  Regular rate and rhythm. No murmur appreciated. ABDOMEN:  Soft, nontender.  Positive, normoactive bowel sounds. No organomegaly palpated (difficult exam due to him being in wheelchair) EXTREMITIES:  + edema bilaterally in lower extremities.   SKIN:  Clear with no obvious rashes or skin changes. No nail dyscrasia. NEURO:  Nonfocal. Well  oriented.  Appropriate affect.   LABORATORY DATA:  I have reviewed the data as listed   Chemistry      Component Value Date/Time   NA 135 08/02/2016 0432   NA 135 (A) 08/02/2016   NA 136 06/06/2016 1101   K 3.8 08/02/2016 0432   K 3.7 06/06/2016 1101   CL 90 (L) 08/02/2016 0432   CO2 41 (H) 08/02/2016 0432   CO2 32 (H) 06/06/2016 1101   BUN 11 08/02/2016 0432   BUN 11 08/02/2016   BUN 24.5 06/06/2016 1101   CREATININE 0.56 (L) 08/02/2016 0432   CREATININE 0.8 06/06/2016 1101   GLU 156 08/02/2016      Component Value Date/Time   CALCIUM 8.1 (L) 08/02/2016 0432   CALCIUM 8.9 06/06/2016 1101   ALKPHOS 96 08/02/2016 0432   ALKPHOS 71 06/06/2016 1101   AST 24 08/02/2016 0432   AST 14 06/06/2016 1101  ALT 20 08/02/2016 0432   ALT 18 06/06/2016 1101   BILITOT 0.2 (L) 08/02/2016 0432   BILITOT 0.56 06/06/2016 1101       Lab Results  Component Value Date   WBC 14.0 (H) 08/07/2016   HGB 9.7 (L) 08/07/2016   HCT 31.1 (L) 08/07/2016   MCV 84.4 08/07/2016   PLT 54 (L) 08/07/2016   NEUTROABS 10.5 (H) 08/07/2016    ASSESSMENT & PLAN:  Acute ITP ([place]) Most likely precipitated by his underlying lung infection.  Treatment summary: 1. High-dose prednisone: No improvement 2. S/P IVIG (lack of response) 3. Rituximab started 05/30/2016 weekly 4  (3 dose of Rituxan were given) 4. Cytoxan 38m/m2 PO  5. Splenectomy on 07/13/2016 6. Romiplostim 180m/kg started on 07/30/2016 Bone marrow biopsy 05/30/2016: Findings suggestive of ITP with lots of megakaryocytes  Hospitalization: 06/10/2016- 06/20/2016 for respiratory distress due to large pleural effusion status post thoracentesis with platelet transfusion support (along with IVIG) Pleural fluid cytology: Benign 07/08/2016-08/02/2016: for SVT, severe refractory ITP, pt underwent to splenectomy, thoracentesis + pseudomonas--treated with antibiotics, was on vent for about a week, received bilateral chest tube placement, loculated  pleural effusion, underwent chest physiotherapy, was followed by cardiology due to CHF/SVT, pulmonology due to pneumonia/COPD/bilateral pleural effusions, hydropneumothorax, ID due to infections, and oncology (ITP and to r/o HIT)during hospitalizations, platelets started dropping at end of hospitalization, so Romiplostim was given on 4/1, liver spleen scan was negative.     Plan:  [place]'s plt count has improved to 54.  He is tolerating the Romiplostim well and will receive an injection today.  We will try to get him in with Dr. BeHaroldine Lawsegarding his CHF prior to April 23.  He will see Dr. WeGustavus Bryanturse tomorrow.  [facility] and got in touch with the nursing director.  I received his chest xray results and will fax Dr. WeGustavus Bryantffice a copy of the results.  Also, will update his medication list.  He will return in one week for labs, evaluation by Dr. GuLindi Adieand Romiplostim. [facility] [place] any hematologic concerns and updated his medication list.    I spent 45 minutes talking to the patient of which more than half was spent in counseling and coordination of care.   The patient has a good understanding of the overall plan. he agrees with it. he will call with any problems that may develop before the next visit here.   LiScot DockNP 08/07/16  Attending Note  I personally saw and examined JeSula RumpleThe plan of care was discussed with him. I agree with the assessment and plan as documented above. We spent almost 1 hours with the patient going over his complaints and treatment plan His ITP is responding to Nplate but he has profound respiratory problems including pulmonary infiltrates and shortness of breat to exertion. We have requested Dr.Bensimhon to see the patient urgently. He has an appt with pulm. [facility] and [facility] He does not need  steroids or cytoxan. We communicated this to the [facility].  Signed GuRulon EisenmengerMD

## 2016-08-08 ENCOUNTER — Encounter: Payer: Self-pay | Admitting: Adult Health

## 2016-08-08 ENCOUNTER — Ambulatory Visit (INDEPENDENT_AMBULATORY_CARE_PROVIDER_SITE_OTHER): Payer: Medicare HMO | Admitting: Adult Health

## 2016-08-08 DIAGNOSIS — J449 Chronic obstructive pulmonary disease, unspecified: Secondary | ICD-10-CM | POA: Diagnosis not present

## 2016-08-08 DIAGNOSIS — J90 Pleural effusion, not elsewhere classified: Secondary | ICD-10-CM | POA: Diagnosis not present

## 2016-08-08 DIAGNOSIS — J479 Bronchiectasis, uncomplicated: Secondary | ICD-10-CM

## 2016-08-08 DIAGNOSIS — I5032 Chronic diastolic (congestive) heart failure: Secondary | ICD-10-CM | POA: Diagnosis not present

## 2016-08-08 DIAGNOSIS — J471 Bronchiectasis with (acute) exacerbation: Secondary | ICD-10-CM

## 2016-08-08 DIAGNOSIS — D693 Immune thrombocytopenic purpura: Secondary | ICD-10-CM

## 2016-08-08 NOTE — Patient Instructions (Addendum)
Continue on current regimen  Continue on Flutter valve.  follow up Dr. Melvyn Novas  In 2-3 weeks as planned with Chest xray .  Please contact office for sooner follow up if symptoms do not improve or worsen or seek emergency care

## 2016-08-08 NOTE — Assessment & Plan Note (Signed)
Recent flare  Cont Lattie Haw Lanetta Inch Philippa Chester

## 2016-08-08 NOTE — Assessment & Plan Note (Signed)
Chest tube suture removed without diff.  Skin care discussed  follow up cxr in 2 weeks

## 2016-08-08 NOTE — Progress Notes (Signed)
@Patient  ID: [name], male    DOB: 1941/02/25, 76 y.o.   MRN: [id number]  Chief Complaint  Patient presents with  . Follow-up    COPD     Referring provider: Christain Sacramento, MD  HPI: 76 yo male former smoker followed for COPD and Bronchiectasis complicated by Diastolic CHF , severe thrombocytopenia and recurrent pleural effusions (exudative)   TEST   08/08/2016 Follow up : COPD/O2 RF /Post hospital  Pt returns for a post hospital follow up . He had 2  prolonged hospitalizations for  Recurrent pleural effusions, refractory thrombocytopenia, Psuedomonas PNA, empyema , acute on chronic resp failure .  Initially admitted in Feb with with pleural effusion s/p throacentesis /diuresis - exudative effusion . He returns shortly after discharge in early March with SVT requiring cardioversion. He had refractory ITP , Hematology consulted with recommendations for splenectomy , he underwent this on 3/15. Pt had persistent left pleural effusion and thoracentesis done on 3/14 was + for psuedomonas. He required prolonged intubation after splenectomy for few days and was extubated on 3/22. He then required bilateral chest tubes placed on 3/16 w/ TPA/DNA due to loculated pleural effusion . Chest tubes were removed on 3/25. CT chest on 3/27 showed persistent consolidation . Chest PT was continued as well as aggressive pulmonary regimen . He was treated with aggressive IV ABX  Completed CIpro course on 3/30.  He continued to have ITP issues,  HIT was ruled out. He was continued on home Amnicar . (he did require 4 Platelets/4 PRBC. He is being followed closely at Hematology . Labs done yesterday showed Plt were improved at 54K. Hbg was improved at 9.7,  Since discharge to rehab center , he is slightly better, making very slow progress. Remains weak, low energy. Still has rattling cough and congestion .  CXR done at rehab report done on , says small effusions .  He denies bleeding , hemoptysis , chest pain,  orthopnea, increased leg swelling .  Does want sutures from chest tube sites removed.     Allergies  Allergen Reactions  . Heparin Other (See Comments)    Heparin-induced thrombocytopenia/thrombosis    Immunization History  Administered Date(s) Administered  . HiB (PRP-T) 07/28/2016  . Influenza Split 02/21/2012  . Influenza, Quadrivalent, Recombinant, Inj, Pf 02/21/2012, 02/17/2016  . Influenza,inj,Quad PF,36+ Mos 02/17/2016  . Meningococcal Mcv4o 07/27/2016  . Pneumococcal Conjugate-13 03/26/2004, 05/01/2008  . Pneumococcal Polysaccharide-23 03/26/2004, 07/27/2016  . Tdap 10/06/2004    Past Medical History:  Diagnosis Date  . Adenomatous colon polyp    01/2006  . Chronic pansinusitis   . COPD (chronic obstructive pulmonary disease) ([place])   . Diverticula of colon   . History of ITP   . History of nephrolithiasis   . Hyperlipemia   . Hypertension   . 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 Pleural effusion   . Sinus drainage    Cough with post nasal drip  . Thrombocytopenia ([place])   . Type 2 diabetes mellitus ([facility])     Tobacco History: History  Smoking Status  . Former Smoker  . Packs/day: 1.00  . Years: 42.00  . Types: Cigarettes  . Start date: 05/02/1959  . Quit date: 05/01/2001  Smokeless Tobacco  . Never Used   Counseling given: Not Answered   Outpatient Encounter Prescriptions as of 08/08/2016  Medication Sig  . albuterol (PROAIR HFA) 108 (90 Base) MCG/ACT inhaler 2 puffs every 4 hours as needed only  if your can't  catch your breath  . aminocaproic acid (AMICAR) 500 MG tablet Take 2 tablets (1,000 mg total) by mouth every 8 (eight) hours.  . budesonide (PULMICORT) 0.5 MG/2ML nebulizer solution Take 2 mLs (0.5 mg total) by nebulization 2 (two) times daily.  . carvedilol (COREG) 6.25 MG tablet Take 1 tablet (6.25 mg total) by mouth 2 (two) times daily with a meal.  . chlorpheniramine-HYDROcodone (TUSSIONEX) 10-8 MG/5ML SUER Take 5 mLs by mouth at bedtime as needed  for cough.  Danella Deis EXTRACT PO Take 1 capsule by mouth daily.  . famotidine (PEPCID) 20 MG tablet Take 20 mg by mouth at bedtime.  . fexofenadine (ALLEGRA) 180 MG tablet Take 180 mg by mouth daily.   . furosemide (LASIX) 20 MG tablet Take 3 tablets (60 mg total) by mouth daily.  Marland Kitchen GINKGO BILOBA PO Take 1 tablet by mouth daily.   . Glucosamine-Chondroit-Vit C-Mn (GLUCOSAMINE 1500 COMPLEX PO) Take 1,500 mg by mouth 2 (two) times daily.  . Insulin Glargine (TOUJEO SOLOSTAR) 300 UNIT/ML SOPN Inject 30 Units into the skin daily.  Marland Kitchen ipratropium (ATROVENT) 0.02 % nebulizer solution Take 2.5 mLs (0.5 mg total) by nebulization 3 (three) times daily.  Marland Kitchen lactose free nutrition (BOOST PLUS) LIQD Take 237 mLs by mouth 3 (three) times daily between meals.  Marland Kitchen losartan (COZAAR) 50 MG tablet Take 1 tablet (50 mg total) by mouth daily.  . Magnesium Oxide 200 MG TABS Take 1 tablet by mouth daily.  . metFORMIN (GLUCOPHAGE) 1000 MG tablet Take 1,000 mg by mouth 2 (two) times daily with a meal.   . Multiple Vitamins-Minerals (MULTIVITAMIN PO) Take 1 tablet by mouth daily.   . pantoprazole (PROTONIX) 40 MG tablet Take 1 tablet (40 mg total) by mouth daily. Take 30-60 min before first meal of the day  . predniSONE (DELTASONE) 10 MG tablet Take 1 tablet (10 mg total) by mouth daily with breakfast.  . Respiratory Therapy Supplies (FLUTTER) DEVI Use as directed  . simvastatin (ZOCOR) 80 MG tablet Take 80 mg by mouth daily.    No facility-administered encounter medications on file as of 08/08/2016.      Review of Systems  Constitutional:   No  weight loss, night sweats,  Fevers, chills,  +fatigue, or  lassitude.  HEENT:   No headaches,  Difficulty swallowing,  Tooth/dental problems, or  Sore throat,                No sneezing, itching, ear ache, +nasal congestion, post nasal drip,   CV:  No chest pain,  Orthopnea, PND, swelling in lower extremities, anasarca, dizziness, palpitations, syncope.   GI  No  heartburn, indigestion, abdominal pain, nausea, vomiting, diarrhea, change in bowel habits, loss of appetite, bloody stools.   Resp: .  No chest wall deformity  Skin: no rash or lesions.  GU: no dysuria, change in color of urine, no urgency or frequency.  No flank pain, no hematuria   MS:  No joint pain or swelling.  No decreased range of motion.  No back pain.    Physical Exam  BP (!) 116/58 (BP Location: Left Arm, Cuff Size: Normal)   Pulse 95   Ht 5' 10.5" (1.791 m)   Wt 180 lb 6.4 oz (81.8 kg)   SpO2 95%   BMI 25.52 kg/m   GEN: A/Ox3; pleasant , NAD, chronically ill appearing in wc   HEENT:  [place]/AT,  EACs-clear, TMs-wnl, NOSE-clear, THROAT-clear, no lesions, no postnasal drip or exudate noted.  NECK:  Supple w/ fair ROM; no JVD; normal carotid impulses w/o bruits; no thyromegaly or nodules palpated; no lymphadenopathy.    RESP  Decreased BS In bases ,  no accessory muscle use, no dullness to percussion, bilateral lateral chest wall with crusted over sutures noted (prev. CT sites) no redness /drainage-removed suture intact w/out difficulty , no bleeding , or redness   CARD:  RRR, no m/r/g, tr -1  peripheral edema, pulses intact, no cyanosis or clubbing.  GI:   Soft & nt; nml bowel sounds; no organomegaly or masses detected.   Musco: Warm bil, no deformities or joint swelling noted.   Neuro: alert, no focal deficits noted.    Skin: Warm, no lesions or rashes    Imaging: Ct Chest Wo Contrast  Result Date: 07/25/2016 CLINICAL DATA:  Follow-up pleural effusion EXAM: CT CHEST WITHOUT CONTRAST TECHNIQUE: Multidetector CT imaging of the chest was performed following the standard protocol without IV contrast. COMPARISON:  07/17/2016 FINDINGS: Cardiovascular: Somewhat limited due to the lack of IV contrast. Diffuse aortic calcifications are noted without aneurysmal dilatation. Coronary calcifications are seen. No significant cardiac enlargement is noted. Mediastinum/Nodes: The  thoracic inlet is within normal limits. Right peritracheal lymph node is noted measuring less than 10 mm in short axis. Scattered small mediastinal and hilar lymph nodes are seen likely reactive in nature. Lungs/Pleura: Bilateral pleural effusions are noted right greater than left with air pockets similar to that seen on the prior exam. The previously seen chest tubes have been removed in the interval. Bilateral lower lobe consolidation is noted considerably greater on the right than the left. These have increased in the interval from the prior exam. Patchy changes are noted in the right upper lobe. Upper Abdomen: Stable from the previous exam. Musculoskeletal: Degenerative change of thoracic spine is noted. IMPRESSION: Stable appearing bilateral pleural effusions with air pockets within. There is been interval removal of chest tubes bilaterally. Bilateral infiltrates right greater than left increasing particularly in the right lower lobe. Electronically Signed   By: Inez Catalina M.D.   On: 07/25/2016 11:57   Ct Chest Wo Contrast  Result Date: 07/17/2016 CLINICAL DATA:  Right pleural effusion. EXAM: CT CHEST WITHOUT CONTRAST TECHNIQUE: Multidetector CT imaging of the chest was performed following the standard protocol without IV contrast. COMPARISON:  06/09/2016 FINDINGS: Cardiovascular: Heart is normal size. Coronary artery calcifications diffusely. Scattered aortic calcifications. No evidence of aortic aneurysm. Mediastinum/Nodes: No mediastinal, hilar, or axillary adenopathy. Endotracheal tube tip is in the mid trachea. Lungs/Pleura: There are moderate bilateral pleural effusions. Bilateral chest tubes are in place. Locules of pleural care noted bilaterally. The locules of gas are not fall anterior in location compatible with multiple loculations bilaterally, right worse than left. Airspace disease noted in the lower lobes bilaterally. Ground-glass nodular opacities throughout the right upper lung and to a  lesser extent lingula. Findings concerning for pneumonia. Upper Abdomen: NG tube is in the stomach. No acute findings in the upper abdomen. Musculoskeletal: Chest wall soft tissues are unremarkable. No acute bony abnormality. IMPRESSION: Partially loculated moderate bilateral hydropneumothoraces with bilateral chest tubes in place. Bilateral lower lobe airspace opacities. Ground-glass nodular opacities in the right upper lobe and lingula. Findings concerning for pneumonia. Coronary artery disease. Electronically Signed   By: Rolm Baptise M.D.   On: 07/17/2016 14:39   Nm Liver Spleen Scan  Result Date: 07/31/2016 CLINICAL DATA:  ITP status post splenectomy 07/13/2016. Thrombocytopenia. EXAM: NUCLEAR MEDICINE LIVER - SPLEEN SCAN TECHNIQUE: Abdominal images were  obtained in multiple projections after intravenous injection of radiopharmaceutical. RADIOPHARMACEUTICALS:  5.2 mCi Tc-27m sulfur colloid IV COMPARISON:  Preoperative CT 06/09/2016. FINDINGS: There is homogeneous activity throughout the liver. No residual splenic tissue identified. Low-level activity is present throughout the bone marrow. IMPRESSION: Normal examination status post splenectomy. No residual or recurrent splenic uptake identified. Electronically Signed   By: Richardean Sale M.D.   On: 07/31/2016 14:09   Dg Chest Port 1 View  Result Date: 07/30/2016 CLINICAL DATA:  Dyspnea, history of ITP with splenectomy 07/13/2016 EXAM: PORTABLE CHEST 1 VIEW COMPARISON:  07/27/2016 FINDINGS: Right-sided PICC line tip projects in the cavoatrial junction. The visualized heart and mediastinal contours are stable with aortic atherosclerosis. Small left and small to moderate right pleural effusions are again visualized with adjacent atelectasis. These appear without significant change. Small amount of extrapleural gas noted within the pleural space as before. There is small pulmonary vascular congestion with slightly low lung volumes as before. IMPRESSION:  Persistent bilateral pleural effusions, small to moderate on the right and small on the left with adjacent atelectasis. Mild vascular congestion is seen. No significant change from prior. Electronically Signed   By: Ashley Royalty M.D.   On: 07/30/2016 20:18   Dg Chest Port 1 View  Result Date: 07/27/2016 CLINICAL DATA:  Pneumonia EXAM: PORTABLE CHEST 1 VIEW COMPARISON:  Chest x-ray of July 24 2016 and chest CT scan of July 25, 2016. FINDINGS: The lungs are mildly hypoinflated. Moderate-sized bilateral pleural effusions remain. There is some gas within the right pleural space and very early. Atelectasis or pneumonia at the right lung base is present with some atelectasis at the left lung base is well. The cardiac silhouette is not enlarged. The pulmonary vascularity is mildly prominent. The right sided PICC line tip projects over the midportion of the SVC. IMPRESSION: The patient is positioned in a lordotic matter today which accentuates the hypoinflation. Persistent bilateral pleural effusions with bibasilar atelectasis or pneumonia. There has not been dramatic change since 24 July 2016. Electronically Signed   By: David  Martinique M.D.   On: 07/27/2016 07:24   Dg Chest Port 1 View  Result Date: 07/24/2016 CLINICAL DATA:  Respiratory failure. EXAM: PORTABLE CHEST 1 VIEW COMPARISON:  07/23/2016. FINDINGS: Interim removal of bilateral chest 2. Right PICC line stable position. Cardiomegaly again noted. Persistent but improved bilateral pulmonary infiltrates. Persistent basilar atelectasis . Persistent bilateral pleural effusions, right side greater than left. Bilateral pleural effusions are again noted . IMPRESSION: 1. Interim removal of bilateral chest tubes. No pneumothorax. Right PICC line stable position. 2. Persistent bilateral pulmonary infiltrates and bilateral pleural effusions, improved slightly from prior exam. Persistent bibasilar atelectasis . Electronically Signed   By: Marcello Moores  Register   On:  07/24/2016 07:06   Dg Chest Port 1 View  Result Date: 07/23/2016 CLINICAL DATA:  Acute respiratory failure. COPD. Pleural effusions. EXAM: PORTABLE CHEST 1 VIEW COMPARISON:  07/22/2016. FINDINGS: No change cardiomediastinal silhouette. Decreasing BILATERAL pleural effusions. Improved aeration of both bases. RIGHT remains worse than LEFT. BILATERAL chest tubes in good position. No pneumothorax. PICC line stable. Unchanged cardiomediastinal silhouette. IMPRESSION: Slight improvement aeration Electronically Signed   By: Staci Righter M.D.   On: 07/23/2016 07:15   Dg Chest Port 1 View  Result Date: 07/22/2016 CLINICAL DATA:  Pleural effusion EXAM: PORTABLE CHEST 1 VIEW COMPARISON:  July 21, 2016 FINDINGS: Central catheter tip is in the superior vena cava. There are chest tubes bilaterally. No pneumothorax. There are bilateral pleural effusions, stable.  There is bibasilar atelectasis. No consolidation evident. Heart is upper normal in size with pulmonary vascularity within normal limits. No bone lesions. IMPRESSION: Tube and catheter positions as described without evident pneumothorax. Small pleural effusions and bibasilar atelectasis persist. No new opacity. Stable cardiac silhouette. Electronically Signed   By: Lowella Grip III M.D.   On: 07/22/2016 07:51   Dg Chest Port 1 View  Result Date: 07/21/2016 CLINICAL DATA:  Recent pneumonia EXAM: PORTABLE CHEST 1 VIEW COMPARISON:  July 20, 2016 FINDINGS: Endotracheal tube and nasogastric tube have been removed. Central catheter tip is in the superior vena cava near the cavoatrial junction. Chest tubes are noted bilaterally. There is no appreciable pneumothorax. There are small pleural effusions bilaterally, slightly larger on the left than on the right. There is mild bibasilar atelectasis. There is new patchy infiltrate in the right mid lung, concerning for early pneumonia in this area. Heart is mildly enlarged with pulmonary vascularity within normal  limits. There is atherosclerotic calcification in the aorta. No adenopathy. No bone lesions. IMPRESSION: Patchy infiltrate right mid lung. Suspect early pneumonia. Small pleural effusions with bibasilar atelectasis present with chest tubes present bilaterally. No pneumothorax. There is aortic atherosclerosis. No change in central catheter position. Electronically Signed   By: Lowella Grip III M.D.   On: 07/21/2016 07:16   Dg Chest Port 1 View  Result Date: 07/20/2016 CLINICAL DATA:  Follow-up pneumonia, check chest tube status EXAM: PORTABLE CHEST 1 VIEW COMPARISON:  07/19/2016 FINDINGS: Cardiac shadow is stable. An endotracheal tube, nasogastric catheter and right-sided PICC line are again seen and stable. Bilateral thoracostomy catheters are noted. No pneumothorax is seen. Minimal bibasilar atelectasis is noted. IMPRESSION: No significant change from the prior exam. Electronically Signed   By: Inez Catalina M.D.   On: 07/20/2016 07:00   Dg Chest Port 1 View  Result Date: 07/19/2016 CLINICAL DATA:  Hypoxia EXAM: PORTABLE CHEST 1 VIEW COMPARISON:  Chest radiograph and chest CT July 17, 2016 FINDINGS: Endotracheal tube tip is 3.3 cm above the carina. Central catheter tip is in the superior vena cava. Nasogastric tube tip and side port are in the stomach. There is a chest tube on the right. No evident pneumothorax. There is patchy atelectatic change in the left base. There is a small pleural effusion on the left. Pleural effusion on the right is smaller. No new opacity is identified. Heart is upper normal in size with pulmonary vascularity within normal limits. No adenopathy. No bone lesions. IMPRESSION: Tube and catheter positions as described without pneumothorax. Patchy atelectatic change noted in left base. Small pleural effusions bilaterally with effusion on the right smaller compared to 2 days prior. No new opacity evident. Stable cardiac silhouette. Electronically Signed   By: Lowella Grip III  M.D.   On: 07/19/2016 08:12   Dg Chest Port 1 View  Result Date: 07/17/2016 CLINICAL DATA:  Endotracheal tube check EXAM: PORTABLE CHEST 1 VIEW COMPARISON:  07/16/2016 chest radiograph FINDINGS: Endotracheal tube tip is in unchanged position just below the level of the clavicles and approximately 4.5 cm above the inferior margin of the carina. Right-sided PICC line tip is at the cavoatrial junction. There bilateral chest tubes in unchanged position. There are small bilateral pleural effusions and associated atelectasis. No visualized pneumothorax. IMPRESSION: 1. Unchanged support apparatus. 2. Small bilateral pleural effusions and associated atelectasis. Electronically Signed   By: Ulyses Jarred M.D.   On: 07/17/2016 05:28   Dg Chest Port 1 View  Result Date: 07/16/2016 CLINICAL  DATA:  Endotracheal tube placement. EXAM: PORTABLE CHEST 1 VIEW COMPARISON:  07/15/2016 FINDINGS: Endotracheal tube unchanged with tip 4.1 cm above the carina. The visualized portions of the nasogastric tube unchanged. Left basilar chest tube unchanged. Interval placement of right-sided PICC line with tip overlying the SVC. Right basilar chest tube unchanged. Lungs are adequately inflated demonstrate persistent hazy opacification over the mid to lower right lung and left base without significant change. Small bilateral pleural effusions right greater than left unchanged. Previous noted small bibasilar loculated pneumothoraces more difficult to visualize as there are smaller with more fluid density. Cardiomediastinal silhouette and remainder of the exam is unchanged. IMPRESSION: Stable hazy opacification over the mid to lower right lung and left base. Findings may be due to infection with small bilateral effusions right greater than left. Previously noted small bibasilar loculated pneumothoraces more difficult to visualize as there are smaller and more filled in with pleural fluid. Tubes and lines as described. Electronically Signed    By: Marin Olp M.D.   On: 07/16/2016 07:23   Dg Chest Port 1 View  Result Date: 07/15/2016 CLINICAL DATA:  Acute respiratory failure. EXAM: PORTABLE CHEST 1 VIEW COMPARISON:  07/14/2016 FINDINGS: Endotracheal tube with tip 4.3 cm above the carina. Visualize nasogastric tube unchanged. Bibasilar chest tubes unchanged. Lungs are adequately inflated with continued improvement in small loculated bibasilar pneumothoraces. Persistent bibasilar opacification likely atelectasis with possible small amount of bilateral pleural fluid. Slight worsening hazy opacification over the right midlung which may be due to atelectasis or infection. Cardiomediastinal silhouette and remainder of the exam is unchanged. IMPRESSION: Slight worsening hazy opacification over the right midlung which may be due to atelectasis or infection. Persistent bibasilar opacification likely atelectasis with possible small bilateral pleural effusions. Bibasilar chest tubes in place with continued interval improvement of small loculated pneumothoraces. Remaining tubes and lines as described. Electronically Signed   By: Marin Olp M.D.   On: 07/15/2016 08:55   Dg Chest Port 1 View  Result Date: 07/14/2016 CLINICAL DATA:  Status post intubation. EXAM: PORTABLE CHEST 1 VIEW COMPARISON:  07/14/2016 at 12:10 p.m. FINDINGS: The endotracheal tube tip projects 3.6 cm above the carina, well positioned. New nasal/ orogastric tube passes below the diaphragm and below the included field of view, into the stomach. Bilateral lower hemithorax chest tubes are stable. The the home pneumothoraces noted in the lower lungs on the prior study have mildly decreased in size. No other change. IMPRESSION: 1. Well-positioned endotracheal tube. Nasal/orogastric tube passes below the diaphragm into the stomach, but below the included field of view. 2. Mild decrease in the size of the bilateral lower lung zone pneumothoraces. Electronically Signed   By: Lajean Manes M.D.    On: 07/14/2016 13:53   Dg Chest Port 1 View  Result Date: 07/14/2016 CLINICAL DATA:  Status post placement of bilateral chest tubes. EXAM: PORTABLE CHEST 1 VIEW COMPARISON:  07/14/2016 at 4:45 a.m. FINDINGS: Sincerely exam, bilateral lower hemithorax chest tubes have been placed. On the right, a chest tube tip projects inferior to the right hilar structures. On the left, the chest tube tip projects over the mid to lower left mid hemithorax. There has been a significant decrease in pleural fluid. Loculated areas noted along the peripheral right mid to lower lung, bb bordering consolidated, partly collapsed lung. On the left, there is a loculated pneumothorax at the lateral left mid to lower hemithorax. Hazy opacities noted in the partly collapsed lower left lung. There is no apical pneumothorax on  either side. IMPRESSION: 1. Status post chest tube placement in both lower hemithoraces with significant decrease in pleural fluid. 2. There are now pneumothoraces in the mid to lower hemithoraces. On the right, this appears loculated. This is more simple in appearance on the left. The partly collapsed lower lungs are densely consolidated on the right with more hazy consolidation on the left, which may be atelectasis, edema or infection, or a combination. Electronically Signed   By: Lajean Manes M.D.   On: 07/14/2016 12:39   Portable Chest Xray  Result Date: 07/14/2016 CLINICAL DATA:  Respiratory failure. EXAM: PORTABLE CHEST 1 VIEW COMPARISON:  07/13/2016. FINDINGS: Interim extubation removal of NG tube. Heart size stable. Pulmonary venous congestion and bilateral pulmonary infiltrates with bilateral pleural effusions suggesting CHF. Bilateral pneumonia cannot be excluded. These findings unchanged prior exam. No pneumothorax. IMPRESSION: 1. Interim extubation removal of NG tube. 2. Pulmonary venous congestion and bilateral pulmonary infiltrates with bilateral pleural effusions again noted. Findings suggest  congestive heart failure. Bilateral pneumonia cannot be excluded. No interim change from prior exam. Electronically Signed   By: Marcello Moores  Register   On: 07/14/2016 06:52   Portable Chest Xray  Result Date: 07/13/2016 CLINICAL DATA:  Respiratory failure.  Endotracheal tube placement. EXAM: PORTABLE CHEST 1 VIEW COMPARISON:  07/12/2016 and prior radiograph FINDINGS: An endotracheal tube is identified with tip 5 cm above the carina. An NG tube is present with tip difficult to visualize but appears to overlie the distal esophagus. Moderate bilateral pleural effusions, right greater than left, and bilateral lower lung atelectasis/ airspace disease again noted. Pulmonary vascular congestion is present. There is no evidence of pneumothorax. IMPRESSION: Support apparatus as described. NG tube tip difficult to visualize but appears to overlie the lower esophagus. Moderate pleural effusions and bilateral lower lung atelectasis/ airspace disease again noted. Electronically Signed   By: Margarette Canada M.D.   On: 07/13/2016 17:39   Dg Chest Port 1 View  Result Date: 07/12/2016 CLINICAL DATA:  Status post left thoracentesis EXAM: PORTABLE CHEST 1 VIEW COMPARISON:  07/11/2016 FINDINGS: Cardiac shadow is stable. Bilateral pleural effusions are again seen. No pneumothorax is noted. Slight reduction in pleural fluid is noted on the left. No bony abnormality is noted. IMPRESSION: Bilateral pleural effusions with slight reduction on the left. No pneumothorax is noted. Electronically Signed   By: Inez Catalina M.D.   On: 07/12/2016 11:17   Dg Chest Port 1 View  Result Date: 07/11/2016 CLINICAL DATA:  Follow-up pleural effusions EXAM: PORTABLE CHEST 1 VIEW COMPARISON:  PA and lateral chest x-ray of July 08, 2016 FINDINGS: There remain moderate-sized bilateral pleural effusions. The hemidiaphragms are obscured. The lower aspects of the heart border are obscured. The pulmonary vascularity is mildly engorged. There is calcification  in the wall of the aortic arch. The trachea is midline. IMPRESSION: Persistent moderate-sized bilateral pleural effusions. Mild pulmonary vascular congestion consistent with low-grade CHF. Thoracic aortic atherosclerosis. Electronically Signed   By: David  Martinique M.D.   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 Bilateral pleural effusions noted on chest x-ray. Patient to undergo splenectomy tomorrow and having some dyspnea. Request was made for bilateral thoracenteses. EXAM: ULTRASOUND GUIDED THERAPEUTIC LEFT THORACENTESIS MEDICATIONS: 1% lidocaine. COMPLICATIONS: None immediate. PROCEDURE: An ultrasound guided thoracentesis was thoroughly discussed with the patient and questions answered. The benefits, risks, alternatives and complications were also discussed. The patient  understands and wishes to proceed with the procedure. Written consent was obtained. Ultrasound was performed to localize and mark an adequate pocket of fluid in the left chest. The area was then prepped and draped in the normal sterile fashion. 1% Lidocaine was used for local anesthesia. Under ultrasound guidance a Safe-T-Centesis catheter was introduced. Thoracentesis was performed on the left side. It was unable to be completely drained dry secondary to hypotension. The catheter was removed and a dressing applied. FINDINGS: A total of approximately 1.2 L of bloody fluid was removed. IMPRESSION: Successful ultrasound guided left thoracentesis yielding 1.2 L of pleural fluid. I discussed this case with Dr. Barry Dienes who is the ordering physician. Unfortunately, due to persistent hypotension the procedure was terminated early on the left and unable to do the right-sided at all. She was agreeable with terminating in the procedure at this time. Read by: Saverio Danker, PA-C Electronically Signed   By: Aletta Edouard  M.D.   On: 07/12/2016 11:20     Assessment & Plan:   COPD  GOLD III  Recent flare with psuedomonas PNA /effusion w/ prolonged hospitalization w/ multiple comorbidities  Recent CXR report appears stable.  Will repeat CXR in 2 weeks to look for clearance .  Cont on aggressive pulmonary hygiene regimen   Plan  Patient Instructions  Continue on current regimen  Continue on Flutter valve.  follow up Dr. Melvyn Novas  In 2-3 weeks as planned with Chest xray .  Please contact office for sooner follow up if symptoms do not improve or worsen or seek emergency care      Obstructive bronchiectasis ([place]) Recent flare  Cont Neb /IS /Flutter   Chronic diastolic CHF (congestive heart failure) ([place]) Appears compensated at present w/out evidence of volume overload.   Pleural effusion, bilateral Chest tube suture removed without diff.  Skin care discussed  follow up cxr in 2 weeks       Rexene Edison, NP 08/08/2016

## 2016-08-08 NOTE — Assessment & Plan Note (Signed)
Recent flare with psuedomonas PNA /effusion w/ prolonged hospitalization w/ multiple comorbidities  Recent CXR report appears stable.  Will repeat CXR in 2 weeks to look for clearance .  Cont on aggressive pulmonary hygiene regimen   Plan  Patient Instructions  Continue on current regimen  Continue on Flutter valve.  follow up Dr. Melvyn Novas  In 2-3 weeks as planned with Chest xray .  Please contact office for sooner follow up if symptoms do not improve or worsen or seek emergency care

## 2016-08-08 NOTE — Assessment & Plan Note (Signed)
Appears compensated at present w/out evidence of volume overload.

## 2016-08-09 ENCOUNTER — Encounter: Payer: Self-pay | Admitting: Internal Medicine

## 2016-08-09 ENCOUNTER — Non-Acute Institutional Stay (SKILLED_NURSING_FACILITY): Payer: Medicare HMO | Admitting: Internal Medicine

## 2016-08-09 ENCOUNTER — Encounter: Payer: Self-pay | Admitting: Adult Health

## 2016-08-09 DIAGNOSIS — I4891 Unspecified atrial fibrillation: Secondary | ICD-10-CM | POA: Diagnosis not present

## 2016-08-09 DIAGNOSIS — E119 Type 2 diabetes mellitus without complications: Secondary | ICD-10-CM | POA: Diagnosis not present

## 2016-08-09 DIAGNOSIS — Z794 Long term (current) use of insulin: Secondary | ICD-10-CM | POA: Diagnosis not present

## 2016-08-09 DIAGNOSIS — J869 Pyothorax without fistula: Secondary | ICD-10-CM

## 2016-08-09 DIAGNOSIS — J449 Chronic obstructive pulmonary disease, unspecified: Secondary | ICD-10-CM

## 2016-08-09 DIAGNOSIS — D693 Immune thrombocytopenic purpura: Secondary | ICD-10-CM | POA: Diagnosis not present

## 2016-08-09 NOTE — Progress Notes (Signed)
Chart and office note reviewed in detail along with available xrays/ labs > agree with a/p as outlined  

## 2016-08-09 NOTE — Progress Notes (Signed)
Provider: Veleta Miners  Location:   [facility] Room Number: 118/A Place of Service:  SNF (31)  PCP: [name], MD Patient Care Team: Christain Sacramento, MD as PCP - General (Family Medicine) Nicholas Lose, MD as Consulting Physician (Hematology and Oncology) Tanda Rockers, MD as Consulting Physician (Pulmonary Disease) Gardenia Phlegm, NP as Nurse Practitioner (Hematology and Oncology)  Extended Emergency Contact Information Primary Emergency Contact: Littler,Shirley Address: [address]          [place] [address] [place] Phone: [phone] Relation: Spouse  Code Status: Full Code Goals of Care: Advanced Directive information Advanced Directives 08/09/2016  Does Patient Have a Medical Advance Directive? Yes  Type of Advance Directive (No Data)  Does patient want to make changes to medical advance directive? No - Patient declined  Pre-existing out of facility DNR order (yellow form or pink MOST form) -      Chief Complaint  Patient presents with  . New Admit To SNF    HPI: Patient is a 76 y.o. male seen today for admission to SNF for therapy.  Patet has complicated history significant for Hypertension, Hyperlipidemia, Diabetes Mellitus , COPD, ITP, CHF and PSVT. Bronchiectasis  He was initially admitted in 02/10 to 02/20 For Pleural Effusion  Was sent home. He came back to ED with Tachycardia. And was found to be in Atrial fibrillation with Hypotension and was treated with cardioversion Shock. He was started on Amiodarone and Cardizem. But he continued to have refractory ITP and underwent Splenectomy on 03/15. He also underwent Left Thoracentesis which was positive for Pseudomonas. He had to be intubated Post op. And had Bilateral Chest tubes which were eventually removed. His Platelets count dropped after initial improvement and he was restarted on Amnicar. Since then he has seen Oncologist agin and his  platelets are up to 51. He has follow up with them. Also has seen NP with Dr Melvyn Novas Repeat Kizzie Ide showed not much change.   He is feeling much better but continues to have Hacking cough with Mucus. He says it is hard for him to Cough due to pain. He continues to work with therapy and is now able to walk with the walker. But very decondition . Gets SOB at small activities. Appetite not very Good wants to know if he can have supplements.  He also thinks his Ear is blocked and want ear drops.    Past Medical History:  Diagnosis Date  . Adenomatous colon polyp    01/2006  . Chronic pansinusitis   . COPD (chronic obstructive pulmonary disease) ([place])   . Diverticula of colon   . History of ITP   . History of nephrolithiasis   . Hyperlipemia   . Hypertension   . 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 Pleural effusion   . Sinus drainage    Cough with post nasal drip  . Thrombocytopenia ([place])   . Type 2 diabetes mellitus ([place])    Past Surgical History:  Procedure Laterality Date  . COLONOSCOPY    . ESOPHAGOGASTRODUODENOSCOPY    . ESOPHAGOGASTRODUODENOSCOPY  05/19/2011   Procedure: ESOPHAGOGASTRODUODENOSCOPY (EGD);  Surgeon: Wonda Horner, MD;  Location: [facility];  Service: Endoscopy;  Laterality: N/A;  stat CBC  APC also needed  . HOT HEMOSTASIS  05/19/2011   Procedure: HOT HEMOSTASIS (ARGON PLASMA COAGULATION/BICAP);  Surgeon: Wonda Horner, MD;  Location: [facility];  Service: Endoscopy;  Laterality: N/A;  . LAPAROSCOPIC SPLENECTOMY N/A 07/13/2016  Procedure: LAPAROSCOPIC SPLENECTOMY;  Surgeon: Stark Klein, MD;  Location: [facility];  Service: General;  Laterality: N/A;  . Spinal cyst resection      reports that he quit smoking about 15 years ago. His smoking use included Cigarettes. He started smoking about 57 years ago. He has a 42.00 pack-year smoking history. He has never used smokeless tobacco. He reports that he drinks alcohol. He reports that he does not use drugs. Social History    Social History  . Marital status: Married    Spouse name: N/A  . Number of children: N/A  . Years of education: N/A   Occupational History  . Not on file.   Social History Main Topics  . Smoking status: Former Smoker    Packs/day: 1.00    Years: 42.00    Types: Cigarettes    Start date: 05/02/1959    Quit date: 05/01/2001  . Smokeless tobacco: Never Used  . Alcohol use Yes     Comment: social  . Drug use: No  . Sexual activity: Not on file   Other Topics Concern  . 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 Denies any pets currently. No bird or mold exposure. No recent travel.          Functional Status Survey:    Family History  Problem Relation Age of Onset  . Colon cancer Mother   . Heart disease Mother   . Liver cancer Father   . Heart disease Father     Health Maintenance  Topic Date Due  . FOOT EXAM  08/03/1950  . OPHTHALMOLOGY EXAM  08/03/1950  . COLONOSCOPY  08/03/1991  . TETANUS/TDAP  10/07/2014  . INFLUENZA VACCINE  11/29/2016  . HEMOGLOBIN A1C  01/09/2017  . PNA vac Low Risk Adult  Completed    Allergies  Allergen Reactions  . Heparin Other (See Comments)    Heparin-induced thrombocytopenia/thrombosis    Allergies as of 08/09/2016      Reactions   Heparin Other (See Comments)   Heparin-induced thrombocytopenia/thrombosis      Medication List       Accurate as of 08/09/16 11:28 AM. Always use your most recent med list.          albuterol 108 (90 Base) MCG/ACT inhaler Commonly known as:  PROAIR HFA 2 puffs every 4 hours as needed only  if your can't catch your breath   aminocaproic acid 500 MG tablet Commonly known as:  AMICAR Take 2 tablets (1,000 mg total) by mouth every 8 (eight) hours.   budesonide 0.5 MG/2ML nebulizer solution Commonly known as:  PULMICORT Take 2 mLs (0.5 mg total) by nebulization 2 (two) times daily.   carvedilol 6.25 MG  tablet Commonly known as:  COREG Take 1 tablet (6.25 mg total) by mouth 2 (two) times daily with a meal.   chlorpheniramine-HYDROcodone 10-8 MG/5ML Suer Commonly known as:  TUSSIONEX Take 5 mLs by mouth at bedtime as needed for cough.   ECHINACEA EXTRACT PO Take 1 capsule by mouth daily.   famotidine 20 MG tablet Commonly known as:  PEPCID Take 20 mg by mouth at bedtime.   fexofenadine 180 MG tablet Commonly known as:  ALLEGRA Take 180 mg by mouth daily.   FLUTTER Devi Use as directed   furosemide 20 MG tablet Commonly known as:  LASIX Take 3 tablets (60 mg total) by mouth daily.   GINKGO  BILOBA PO Take 1 tablet by mouth daily.   GLUCOSAMINE 1500 COMPLEX PO Take 1,500 mg by mouth 2 (two) times daily.   Insulin Glargine 300 UNIT/ML Sopn Inject 15 unit subcutaneously at bedtime for DM   ipratropium 0.02 % nebulizer solution Commonly known as:  ATROVENT Take 2.5 mLs (0.5 mg total) by nebulization 3 (three) times daily.   lactose free nutrition Liqd Take 237 mLs by mouth 3 (three) times daily between meals.   losartan 25 MG tablet Commonly known as:  COZAAR Take 25 mg by mouth daily.   magnesium oxide 400 MG tablet Commonly known as:  MAG-OX Take 400 mg by mouth daily.   metFORMIN 1000 MG tablet Commonly known as:  GLUCOPHAGE Take 1,000 mg by mouth 2 (two) times daily with a meal.   DECUBI-VITE Caps Give 1 dose by mouth one time a day for wound healing   MULTIVITAMIN PO Take 1 tablet by mouth daily.   pantoprazole 40 MG tablet Commonly known as:  PROTONIX Take 1 tablet (40 mg total) by mouth daily. Take 30-60 min before first meal of the day   predniSONE 10 MG tablet Commonly known as:  DELTASONE Take 1 tablet (10 mg total) by mouth daily with breakfast.   simvastatin 80 MG tablet Commonly known as:  ZOCOR Take 80 mg by mouth daily.       Review of Systems  Constitutional: Positive for activity change, appetite change and fatigue. Negative for  chills, diaphoresis and fever.  HENT: Negative.   Respiratory: Positive for cough and shortness of breath. Negative for apnea and chest tightness.   Cardiovascular: Positive for leg swelling. Negative for chest pain.  Gastrointestinal: Negative.   Genitourinary: Negative.   Musculoskeletal: Negative.   Skin: Negative.   Neurological: Positive for weakness. Negative for dizziness and light-headedness.  Psychiatric/Behavioral: Negative.     Vitals:   08/09/16 1127  BP: 122/69  Pulse: 82  Resp: 20  Temp: 97 F (36.1 C)  TempSrc: Oral   There is no height or weight on file to calculate BMI. Physical Exam  Constitutional: He is oriented to person, place, and time. He appears well-developed and well-nourished.  HENT:  Head: Normocephalic.  Mouth/Throat: Oropharynx is clear and moist.  Eyes: Pupils are equal, round, and reactive to light.  Neck: Neck supple.  Cardiovascular: Normal rate, regular rhythm and normal heart sounds.   No murmur heard. Pulmonary/Chest: Effort normal and breath sounds normal. No respiratory distress. He has no wheezes. He has no rales.  Abdominal: Soft. Bowel sounds are normal. He exhibits no distension. There is no tenderness. There is no rebound.  Musculoskeletal:  Moderate edema in Both LE  Neurological: He is alert and oriented to person, place, and time.  No focal deficit  Skin: Skin is warm and dry.  Psychiatric: He has a normal mood and affect. His behavior is normal. Thought content normal.    Labs reviewed: Basic Metabolic Panel:  Recent Labs  07/31/16 0349 08/01/16  08/01/16 0847 08/02/16 08/02/16 0432  NA 136 134*  --  134* 135* 135  K 4.3 4.6  --  4.6  --  3.8  CL 89*  --   --  89*  --  90*  CO2 42*  --   --  39*  --  41*  GLUCOSE 193*  --   --  224*  --  156*  BUN 12 10  --  10 11 11   CREATININE 0.59* 0.6  < >  0.57* 0.6 0.56*  CALCIUM 8.2*  --   --  8.5*  --  8.1*  MG 1.7  --   --  1.6*  --  1.8  PHOS 2.6  --   --  3.0  --  2.6   < > = values in this interval not displayed. Liver Function Tests:  Recent Labs  07/31/16 0349 08/01/16 08/01/16 0847 08/02/16 0432  AST 20 28 28 24   ALT 19 21 21 20   ALKPHOS 93 107 107 96  BILITOT 0.4  --  0.5 0.2*  PROT 5.4*  --  6.1* 5.3*  ALBUMIN 1.9*  --  2.2* 1.9*   No results for input(s): LIPASE, AMYLASE in the last 8760 hours. No results for input(s): AMMONIA in the last 8760 hours. CBC:  Recent Labs  08/01/16 0847 08/02/16 08/02/16 0432 08/07/16 1015  WBC 12.5* 13.8 13.8* 14.0*  NEUTROABS 9.3*  --  9.8* 10.5*  HGB 9.5*  --  8.7* 9.7*  HCT 31.2*  --  28.1* 31.1*  MCV 87.9  --  87.3 84.4  PLT 11*  --  10* 54*   Cardiac Enzymes:  Recent Labs  07/08/16 2058 07/09/16 0535 07/09/16 1722  TROPONINI <0.03 <0.03 <0.03   BNP: Invalid input(s): POCBNP Lab Results  Component Value Date   HGBA1C 6.6 (H) 07/09/2016   Lab Results  Component Value Date   TSH 5.003 (H) 07/09/2016   No results found for: VITAMINB12 No results found for: FOLATE No results found for: IRON, TIBC, FERRITIN  Imaging and Procedures obtained prior to SNF admission: Dg Chest 2 View  Result Date: 07/08/2016 CLINICAL DATA:  Shortness of breath and atrial fibrillation. EXAM: CHEST  2 VIEW COMPARISON:  06/27/2016 FINDINGS: Moderate bilateral pleural effusions are again noted, slightly increased on the left. Bilateral lower lung atelectasis, pulmonary vascular congestion and probable interstitial edema again noted. There is no evidence of pneumothorax. No other significant changes identified. IMPRESSION: Pulmonary vascular congestion and probable interstitial edema again noted. Moderate bilateral pleural effusions, slightly increased on the left. Continued bilateral lower lung atelectasis. Electronically Signed   By: Margarette Canada M.D.   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 He just got  injection for Romiplostim. And his platelet counts are improving. They need to be contacted for any hematology needs. Will do CBC tomorrow. He has follow up appointment with them next week.  He is not having any signs of Bleeding.  Atrial fibrillation with RVR  Rate controlled. Clinically in Sinus rhythm right now. Is on low dose of Coreg. Not candidate for Anticoagulation.  COPD  GOLD III  On 3 litre of oxygen. Monitor POX. He is now on Nebs and his Inhalers. He has follow up with Dr Melvyn Novas . They are going to do Chest Xray in their office to compare.  Diabetes mellitus, type II BS staying mostly less then 200. Will need to continue to monitor.  Empyema  Has finished his Antibiotics and Follow up C xray with Pulmonologist.  Chronic CHF He now has appointment with cardiologist On Lasix 60 mg Daily weights Bmp tomorrow.  Protein Calorie Malnutrition Albumin is 1.9 Start on supplements He is doing well with therapy. His prognosis is still Guarded.  TSH was high.Free T$ was normal. Repeat TSH.   Family/ staff Communication:   Labs/tests ordered: Bmp to follow Electrolytes and TSH. Total time spent in this patient care encounter  was 45_ minutes; greater than 50% of the visit spent counseling patient and coordinating care for problems addressed at this encounter.

## 2016-08-10 NOTE — Assessment & Plan Note (Signed)
Cont follow up with Hematology

## 2016-08-10 NOTE — Progress Notes (Signed)
Chart and office note reviewed in detail along with available xrays/ labs > agree with a/p as outlined  

## 2016-08-14 ENCOUNTER — Other Ambulatory Visit (HOSPITAL_BASED_OUTPATIENT_CLINIC_OR_DEPARTMENT_OTHER): Payer: Medicare HMO

## 2016-08-14 ENCOUNTER — Ambulatory Visit (HOSPITAL_BASED_OUTPATIENT_CLINIC_OR_DEPARTMENT_OTHER): Payer: Medicare HMO

## 2016-08-14 ENCOUNTER — Encounter: Payer: Self-pay | Admitting: Hematology and Oncology

## 2016-08-14 ENCOUNTER — Telehealth: Payer: Self-pay | Admitting: Internal Medicine

## 2016-08-14 ENCOUNTER — Ambulatory Visit (HOSPITAL_BASED_OUTPATIENT_CLINIC_OR_DEPARTMENT_OTHER): Payer: Medicare HMO | Admitting: Hematology and Oncology

## 2016-08-14 DIAGNOSIS — D693 Immune thrombocytopenic purpura: Secondary | ICD-10-CM

## 2016-08-14 DIAGNOSIS — R0602 Shortness of breath: Secondary | ICD-10-CM | POA: Diagnosis not present

## 2016-08-14 DIAGNOSIS — R05 Cough: Secondary | ICD-10-CM | POA: Diagnosis not present

## 2016-08-14 LAB — CBC WITH DIFFERENTIAL/PLATELET
BASO%: 0.5 % (ref 0.0–2.0)
BASOS ABS: 0.1 10*3/uL (ref 0.0–0.1)
EOS%: 0.3 % (ref 0.0–7.0)
Eosinophils Absolute: 0 10*3/uL (ref 0.0–0.5)
HCT: 30.4 % — ABNORMAL LOW (ref 38.4–49.9)
HEMOGLOBIN: 9.6 g/dL — AB (ref 13.0–17.1)
LYMPH%: 13.1 % — ABNORMAL LOW (ref 14.0–49.0)
MCH: 26.2 pg — AB (ref 27.2–33.4)
MCHC: 31.4 g/dL — ABNORMAL LOW (ref 32.0–36.0)
MCV: 83.2 fL (ref 79.3–98.0)
MONO#: 0.8 10*3/uL (ref 0.1–0.9)
MONO%: 6.3 % (ref 0.0–14.0)
NEUT%: 79.8 % — ABNORMAL HIGH (ref 39.0–75.0)
NEUTROS ABS: 10 10*3/uL — AB (ref 1.5–6.5)
Platelets: 68 10*3/uL — ABNORMAL LOW (ref 140–400)
RBC: 3.65 10*6/uL — ABNORMAL LOW (ref 4.20–5.82)
RDW: 17.4 % — AB (ref 11.0–14.6)
WBC: 12.5 10*3/uL — AB (ref 4.0–10.3)
lymph#: 1.6 10*3/uL (ref 0.9–3.3)

## 2016-08-14 MED ORDER — ROMIPLOSTIM 250 MCG ~~LOC~~ SOLR
1.0000 ug/kg | Freq: Once | SUBCUTANEOUS | Status: AC
  Administered 2016-08-14: 80 ug via SUBCUTANEOUS
  Filled 2016-08-14: qty 0.16

## 2016-08-14 NOTE — Telephone Encounter (Signed)
lmtcb for pt.  

## 2016-08-14 NOTE — Progress Notes (Signed)
Thank you so much!!! I know they and we will greatly appreciate it.    [name]

## 2016-08-14 NOTE — Progress Notes (Signed)
Patient Care Team: Christain Sacramento, MD as PCP - General (Family Medicine) Nicholas Lose, MD as Consulting Physician (Hematology and Oncology) Tanda Rockers, MD as Consulting Physician (Pulmonary Disease) Gardenia Phlegm, NP as Nurse Practitioner (Hematology and Oncology)  DIAGNOSIS:  Encounter Diagnosis  Name Primary?  . 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 He continues to be profoundly short of breath and is also has a productive cough. He has seen pulmonology last week and is going to see cardiology next week. He is really hoping that they would have some answers to help his symptoms. His platelet counts continue to improve slowly.  REVIEW OF SYSTEMS:   Constitutional: Denies fevers, chills or abnormal weight loss Eyes: Denies blurriness of vision Ears, nose, mouth, throat, and face: Denies mucositis or sore throat Respiratory: Profound shortness of breath to exertion and a chronic cough Cardiovascular: Denies palpitation, chest discomfort Gastrointestinal:  Denies nausea, heartburn or change in bowel habits Skin: Denies abnormal skin rashes Lymphatics: Denies new lymphadenopathy or easy bruising Neurological: Generalized weakness Behavioral/Psych: Mood is stable, no new changes  Extremities: No lower extremity edema  All other systems were reviewed with the patient and are negative.  I have reviewed the past medical history, past surgical history, social history and family history with the patient and they are unchanged from previous note.  ALLERGIES:  is allergic to heparin.  MEDICATIONS:  Current Outpatient Prescriptions  Medication Sig Dispense Refill  . albuterol (PROAIR HFA) 108 (90 Base) MCG/ACT inhaler 2 puffs every 4  hours as needed only  if your can't catch your breath 1 Inhaler 11  . aminocaproic acid (AMICAR) 500 MG tablet Take 2 tablets (1,000 mg total) by mouth every 8 (eight) hours.    . budesonide (PULMICORT) 0.5 MG/2ML nebulizer solution Take 2 mLs (0.5 mg total) by nebulization 2 (two) times daily. 220 mL 0  . carvedilol (COREG) 6.25 MG tablet Take 1 tablet (6.25 mg total) by mouth 2 (two) times daily with a meal.    . chlorpheniramine-HYDROcodone (TUSSIONEX) 10-8 MG/5ML SUER Take 5 mLs by mouth at bedtime as needed for cough. 115 mL 0  . ECHINACEA EXTRACT PO Take 1 capsule by mouth daily.    . famotidine (PEPCID) 20 MG tablet Take 20 mg by mouth at bedtime.    . fexofenadine (ALLEGRA) 180 MG tablet Take 180 mg by mouth daily.     . furosemide (LASIX) 20 MG tablet Take 3 tablets (60 mg total) by mouth daily.    Marland Kitchen GINKGO BILOBA PO Take 1 tablet by mouth daily.     . Glucosamine-Chondroit-Vit C-Mn (GLUCOSAMINE 1500 COMPLEX PO) Take 1,500 mg by mouth 2 (two) times daily.    . Insulin Glargine 300 UNIT/ML SOPN Inject 15 unit subcutaneously at bedtime for DM    . ipratropium (ATROVENT) 0.02 % nebulizer solution Take 2.5 mLs (0.5 mg total) by nebulization 3 (three) times daily. 75 mL 12  . lactose free nutrition (BOOST PLUS) LIQD Take 237 mLs by mouth 3 (three) times daily between meals.  0  . losartan (COZAAR) 25 MG tablet Take 25 mg by mouth daily.    . magnesium oxide (MAG-OX) 400 MG tablet Take 400 mg by mouth daily.    . metFORMIN (GLUCOPHAGE) 1000 MG  tablet Take 1,000 mg by mouth 2 (two) times daily with a meal.     . Multiple Vitamins-Minerals (DECUBI-VITE) CAPS Give 1 dose by mouth one time a day for wound healing    . Multiple Vitamins-Minerals (MULTIVITAMIN PO) Take 1 tablet by mouth daily.     . pantoprazole (PROTONIX) 40 MG tablet Take 1 tablet (40 mg total) by mouth daily. Take 30-60 min before first meal of the day 30 tablet 2  . predniSONE (DELTASONE) 10 MG tablet Take 1 tablet (10 mg total)  by mouth daily with breakfast. 30 tablet 0  . Respiratory Therapy Supplies (FLUTTER) DEVI Use as directed 1 each 0  . simvastatin (ZOCOR) 80 MG tablet Take 80 mg by mouth daily.      No current facility-administered medications for this visit.     PHYSICAL EXAMINATION: ECOG PERFORMANCE STATUS: 1 - Symptomatic but completely ambulatory  Vitals:   08/14/16 1356  BP: (!) 105/47  Pulse: (!) 51  Resp: 19  Temp: 98 F (36.7 C)   Filed Weights   08/14/16 1356  Weight: 180 lb 9.6 oz (81.9 kg)    GENERAL:alert, no distress and comfortable SKIN: skin color, texture, turgor are normal, no rashes or significant lesions EYES: normal, Conjunctiva are pink and non-injected, sclera clear OROPHARYNX:no exudate, no erythema and lips, buccal mucosa, and tongue normal  NECK: supple, thyroid normal size, non-tender, without nodularity LYMPH:  no palpable lymphadenopathy in the cervical, axillary or inguinal LUNGS: Bilateral crackles and wheezes HEART: regular rate & rhythm and no murmurs and no lower extremity edema ABDOMEN:abdomen soft, non-tender and normal bowel sounds MUSCULOSKELETAL:no cyanosis of digits and no clubbing  NEURO: alert & oriented x 3 with fluent speech, no focal motor/sensory deficits EXTREMITIES: No lower extremity edema  LABORATORY DATA:  I have reviewed the data as listed   Chemistry      Component Value Date/Time   NA 135 08/02/2016 0432   NA 135 (A) 08/02/2016   NA 136 06/06/2016 1101   K 3.8 08/02/2016 0432   K 3.7 06/06/2016 1101   CL 90 (L) 08/02/2016 0432   CO2 41 (H) 08/02/2016 0432   CO2 32 (H) 06/06/2016 1101   BUN 11 08/02/2016 0432   BUN 11 08/02/2016   BUN 24.5 06/06/2016 1101   CREATININE 0.56 (L) 08/02/2016 0432   CREATININE 0.8 06/06/2016 1101   GLU 156 08/02/2016      Component Value Date/Time   CALCIUM 8.1 (L) 08/02/2016 0432   CALCIUM 8.9 06/06/2016 1101   ALKPHOS 96 08/02/2016 0432   ALKPHOS 71 06/06/2016 1101   AST 24 08/02/2016 0432     AST 14 06/06/2016 1101   ALT 20 08/02/2016 0432   ALT 18 06/06/2016 1101   BILITOT 0.2 (L) 08/02/2016 0432   BILITOT 0.56 06/06/2016 1101       Lab Results  Component Value Date   WBC 12.5 (H) 08/14/2016   HGB 9.6 (L) 08/14/2016   HCT 30.4 (L) 08/14/2016   MCV 83.2 08/14/2016   PLT 68 (L) 08/14/2016   NEUTROABS 10.0 (H) 08/14/2016    ASSESSMENT & PLAN:  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  Treatment summary: 1. High-dose prednisone: No improvement 2. S/P IVIG (lack of response) 3.Rituximab started 05/30/2016 weekly 4 (3 dose of Rituxan were given) 4. Cytoxan '75mg'$ /m2 PO  5. Splenectomy on 07/13/2016 6. Romiplostim 41mg/kg started on 07/30/2016 Bone marrow biopsy 05/30/2016: Findings  suggestive of ITP with lots of megakaryocytes  Hospitalization: 06/10/2016- 06/20/2016 for respiratory distress due to large pleural effusion status post thoracentesis with platelet transfusion support (along with IVIG) Pleural fluid cytology: Benign 07/08/2016-08/02/2016: for SVT, severe refractory ITP, pt underwent to splenectomy, thoracentesis + pseudomonas--treated with antibiotics, was on vent for about a week, received bilateral chest tube placement, loculated pleural effusion, underwent chest physiotherapy, was followed by cardiology due to CHF/SVT, pulmonology due to pneumonia/COPD/bilateral pleural effusions, hydropneumothorax, ID due to infections, and oncology (ITP and to r/o HIT)during hospitalizations, platelets started dropping at end of hospitalization, so Romiplostim was given on 4/1, liver spleen scan was negative.    Lab review: Platelets 68 on 08/16/2016 Weekly for Nplate treatment with platelet count check I will see him back in 2 weeks  Respiratory distress: Continues to be a problem. I sent him a prescription for oxygen concentrator He sees pulmonology and he will be seeing  cardiology soon.  I spent 25 minutes talking to the patient of which more than half was spent in counseling and coordination of care.  No orders of the defined types were placed in this encounter.  The patient has a good understanding of the overall plan. he agrees with it. he will call with any problems that may develop before the next visit here.   Rulon Eisenmenger, MD 08/14/16

## 2016-08-14 NOTE — Assessment & Plan Note (Signed)
Most likely precipitated by his underlying lung infection.  Treatment summary: 1. High-dose prednisone: No improvement 2. S/P IVIG (lack of response) 3.Rituximab started 05/30/2016 weekly 4 (3 dose of Rituxan were given) 4. Cytoxan '75mg'$ /m2 PO  5. Splenectomy on 07/13/2016 6. Romiplostim 26mg/kg started on 07/30/2016 Bone marrow biopsy 05/30/2016: Findings suggestive of ITP with lots of megakaryocytes  Hospitalization: 06/10/2016- 06/20/2016 for respiratory distress due to large pleural effusion status post thoracentesis with platelet transfusion support (along with IVIG) Pleural fluid cytology: Benign 07/08/2016-08/02/2016: for SVT, severe refractory ITP, pt underwent to splenectomy, thoracentesis + pseudomonas--treated with antibiotics, was on vent for about a week, received bilateral chest tube placement, loculated pleural effusion, underwent chest physiotherapy, was followed by cardiology due to CHF/SVT, pulmonology due to pneumonia/COPD/bilateral pleural effusions, hydropneumothorax, ID due to infections, and oncology (ITP and to r/o HIT)during hospitalizations, platelets started dropping at end of hospitalization, so Romiplostim was given on 4/1, liver spleen scan was negative.    Lab review: Platelets 54 on 08/09/2016 Weekly for Nplate treatment with platelet count check

## 2016-08-14 NOTE — Telephone Encounter (Signed)
Pt  Wife calling again wanting to know where they can get a light weight portable o2 tank, please advise.Hillery Hunter

## 2016-08-14 NOTE — Telephone Encounter (Signed)
Pt wife says she will be gone most of the day and that it is ok to leave a detailed msg on voice mail.[name]

## 2016-08-15 ENCOUNTER — Encounter: Payer: Self-pay | Admitting: Adult Health

## 2016-08-15 ENCOUNTER — Non-Acute Institutional Stay (SKILLED_NURSING_FACILITY): Payer: Medicare HMO | Admitting: Adult Health

## 2016-08-15 DIAGNOSIS — J449 Chronic obstructive pulmonary disease, unspecified: Secondary | ICD-10-CM | POA: Diagnosis not present

## 2016-08-15 DIAGNOSIS — I110 Hypertensive heart disease with heart failure: Secondary | ICD-10-CM | POA: Diagnosis not present

## 2016-08-15 DIAGNOSIS — J9611 Chronic respiratory failure with hypoxia: Secondary | ICD-10-CM

## 2016-08-15 DIAGNOSIS — I471 Supraventricular tachycardia: Secondary | ICD-10-CM | POA: Diagnosis not present

## 2016-08-15 DIAGNOSIS — J9612 Chronic respiratory failure with hypercapnia: Secondary | ICD-10-CM

## 2016-08-15 DIAGNOSIS — I5033 Acute on chronic diastolic (congestive) heart failure: Secondary | ICD-10-CM | POA: Diagnosis not present

## 2016-08-15 NOTE — Telephone Encounter (Signed)
Called and spoke with pts wife and she wanted to find out about purchasing a POC ---she stated that they did have a doctors appt yesterday and she does have a script for this but they are looking for a light weigh portable POC that makes its own oxygen.  She has checked with [facility] and they advised her that they do not have this type of machine.  PCC's any help with this?  Any idea who they may be able to contact for this?  thanks

## 2016-08-15 NOTE — Progress Notes (Signed)
Location:   [facility] Room Number: 118 A Place of Service:  SNF (31)    CODE STATUS: DNR  Allergies  Allergen Reactions  . Heparin Other (See Comments)    Heparin-induced thrombocytopenia/thrombosis    Chief Complaint  Patient presents with  . Discharge Note    Discharge    HPI:  He is being discharged to home with home health for pt/ot/rn/cna. He will need home 02 at 3L/[place] his room air 02 sat is 80% at rest with 95% with 02. He will need a wheelchair. He will need his prescriptions to be written and will need to follow up with his medical provider.    Past Medical History:  Diagnosis Date  . Adenomatous colon polyp    01/2006  . Chronic pansinusitis   . COPD (chronic obstructive pulmonary disease) ([place])   . Diverticula of colon   . History of ITP   . History of nephrolithiasis   . Hyperlipemia   . Hypertension   . 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 Pleural effusion   . Sinus drainage    Cough with post nasal drip  . Thrombocytopenia ([place])   . Type 2 diabetes mellitus ([place])     Past Surgical History:  Procedure Laterality Date  . COLONOSCOPY    . ESOPHAGOGASTRODUODENOSCOPY    . ESOPHAGOGASTRODUODENOSCOPY  05/19/2011   Procedure: ESOPHAGOGASTRODUODENOSCOPY (EGD);  Surgeon: Wonda Horner, MD;  Location: [facility];  Service: Endoscopy;  Laterality: N/A;  stat CBC  APC also needed  . HOT HEMOSTASIS  05/19/2011   Procedure: HOT HEMOSTASIS (ARGON PLASMA COAGULATION/BICAP);  Surgeon: Wonda Horner, MD;  Location: [facility];  Service: Endoscopy;  Laterality: N/A;  . LAPAROSCOPIC SPLENECTOMY N/A 07/13/2016   Procedure: LAPAROSCOPIC SPLENECTOMY;  Surgeon: Stark Klein, MD;  Location: [facility];  Service: General;  Laterality: N/A;  . Spinal cyst resection      Social History   Social History  . Marital status: Married    Spouse name: N/A  . Number of children: N/A  . Years of education: N/A   Occupational History  . Not on file.   Social History  Main Topics  . Smoking status: Former Smoker    Packs/day: 1.00    Years: 42.00    Types: Cigarettes    Start date: 05/02/1959    Quit date: 05/01/2001  . Smokeless tobacco: Never Used  . Alcohol use Yes     Comment: social  . Drug use: No  . Sexual activity: Not on file   Other Topics Concern  . 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 Denies any pets currently. No bird or mold exposure. No recent travel.         Family History  Problem Relation Age of Onset  . Colon cancer Mother   . Heart disease Mother   . Liver cancer Father   . Heart disease Father     VITAL SIGNS BP 101/60   Pulse 99   Temp (!) 96.8 F (36 C)   Resp 20   Ht 5\' 11"  (1.803 m)   Wt 176 lb 3.2 oz (79.9 kg)   SpO2 95%   BMI 24.57 kg/m   Patient's Medications  New Prescriptions   No medications on file  Previous Medications   ALBUTEROL (PROAIR HFA) 108 (90 BASE) MCG/ACT INHALER    2 puffs every 4 hours as  needed only  if your can't catch your breath   AMINO ACIDS-PROTEIN HYDROLYS (FEEDING SUPPLEMENT, PRO-STAT SUGAR FREE 64,) LIQD    Take 30 mLs by mouth 2 (two) times daily.   AMINOCAPROIC ACID (AMICAR) 500 MG TABLET    Take 2 tablets (1,000 mg total) by mouth every 8 (eight) hours.   BUDESONIDE (PULMICORT) 0.5 MG/2ML NEBULIZER SOLUTION    Take 2 mLs (0.5 mg total) by nebulization 2 (two) times daily.   CARVEDILOL (COREG) 6.25 MG TABLET    Take 1 tablet (6.25 mg total) by mouth 2 (two) times daily with a meal.   CHLORPHENIRAMINE-HYDROCODONE (TUSSIONEX) 10-8 MG/5ML SUER    Take 5 mLs by mouth at bedtime as needed for cough.   ECHINACEA EXTRACT PO    Take 1 capsule by mouth daily.   FAMOTIDINE (PEPCID) 20 MG TABLET    Take 20 mg by mouth at bedtime.   FEXOFENADINE (ALLEGRA) 180 MG TABLET    Take 180 mg by mouth daily.    FUROSEMIDE (LASIX) 20 MG TABLET    Take 3 tablets (60 mg total) by mouth daily.   GINKGO BILOBA PO     Take 1 tablet by mouth daily.    GLUCOSAMINE-CHONDROIT-VIT C-MN (GLUCOSAMINE 1500 COMPLEX PO)    Take 1,500 mg by mouth 2 (two) times daily.   INSULIN GLARGINE 300 UNIT/ML SOPN    Inject 15 unit subcutaneously at bedtime for DM   IPRATROPIUM (ATROVENT) 0.02 % NEBULIZER SOLUTION    Take 2.5 mLs (0.5 mg total) by nebulization 3 (three) times daily.   LOSARTAN (COZAAR) 25 MG TABLET    Take 25 mg by mouth daily.   MAGNESIUM OXIDE (MAG-OX) 400 MG TABLET    Take 400 mg by mouth daily.   METFORMIN (GLUCOPHAGE) 1000 MG TABLET    Take 1,000 mg by mouth 2 (two) times daily with a meal.    MULTIPLE VITAMINS-MINERALS (DECUBI-VITE) CAPS    Give 1 dose by mouth one time a day for wound healing   MULTIPLE VITAMINS-MINERALS (MULTIVITAMIN PO)    Take 1 tablet by mouth daily.    PANTOPRAZOLE (PROTONIX) 40 MG TABLET    Take 1 tablet (40 mg total) by mouth daily. Take 30-60 min before first meal of the day   PREDNISONE (DELTASONE) 10 MG TABLET    Take 1 tablet (10 mg total) by mouth daily with breakfast.   SIMVASTATIN (ZOCOR) 80 MG TABLET    Take 80 mg by mouth daily.    UNABLE TO FIND    Med Pass Sugar Free - 240 cc two times daily  Modified Medications   No medications on file  Discontinued Medications   LACTOSE FREE NUTRITION (BOOST PLUS) LIQD    Take 237 mLs by mouth 3 (three) times daily between meals.   RESPIRATORY THERAPY SUPPLIES (FLUTTER) DEVI    Use as directed     SIGNIFICANT DIAGNOSTIC EXAMS  07-08-16: chest x-ray: Pulmonary vascular congestion and probable interstitial edema again noted. Moderate bilateral pleural effusions, slightly increased on the left. Continued bilateral lower lung atelectasis.  07-09-16: 2-d echo: - Overall limited images. Mild LVH with LVEF 60-65%. Probable grade 2 diastolic dysfunction. Calcified mitral annulus with trivial  mitral regurgitation. Aortic valve is moderately calcified and not well seen. Right ventricle appears mildly dilated. Trivial  tricuspid regurgitation.  Right pleural effusion is evident.   07-12-16: left thoracentesis: Successful ultrasound guided left thoracentesis yielding 1.2 L of pleural fluid.   07-13-16: chest x-ray: Support apparatus  as described. NG tube tip difficult to visualize but appears to overlie the lower esophagus. Moderate pleural effusions and bilateral lower lung atelectasis/airspace disease again noted.  07-17-16: ct of chest: Partially loculated moderate bilateral hydropneumothoraces with bilateral chest tubes in place. Bilateral lower lobe airspace opacities. Ground-glass nodular opacities in the right upper lobe and lingula. Findings concerning for pneumonia. Coronary artery disease.   07-25-16: ct of chest: Stable appearing bilateral pleural effusions with air pockets within. There is been interval removal of chest tubes bilaterally. Bilateral infiltrates right greater than left increasing particularly in the right lower lobe.   07-31-16: NM liver-spleen: Normal examination status post splenectomy. No residual or recurrent splenic uptake identified.   LABS REVIEWED:   07-08-16: wbc 9.1; hgb 11.2; hct 34.6; mcv 88.;3 plt <5; glucose 206; bun 32; creat 1.23; k+ 3.7; na++ 134; ca 8.3; alt 13; albumin 2.7 07-09-16; tsh 5.003; free T 4: 1.39; chol 126; ldl 63; trig 110; hdl 41; mag 1.0; pre-albumin 10.1 07-15-16: wbc 12.5; hgb 10.5; hct 32.9; mcv 86.1; plt 53; glucose 250; bun 25; creat 1.04; k+ 3.3; na++ 138; ca 8.1 07-23-16: wbc 12.7; hgb 9.5; hct 31.3; mcv 89.4; plt 266 glucose 193; bun 36; creat 0.80; k+ 3.9; na++ 142; mag 1.7; phos 2.5  08-02-16: wbc 13.8; hgb 8.7; hct 28.1; mcv 87.3; plt 10; glucose 156; bun 11; creat 0.56; k+ 3.8; na++ 135; total bili 0.2; albumin 1.9; mag 1.8; phos 2.6   Review of Systems  Constitutional: Positive for malaise/fatigue.  Respiratory: Positive for cough and shortness of breath.        Is on 02 at 3L.  Is using flutter valve and I/S.   Cardiovascular: Negative for chest pain, palpitations  and leg swelling.  Gastrointestinal: Negative for abdominal pain, constipation and heartburn.  Musculoskeletal: Negative for back pain, joint pain and myalgias.  Skin: Negative.   Neurological: Positive for weakness. Negative for dizziness.  Psychiatric/Behavioral: The patient is not nervous/anxious.      Physical Exam  Constitutional: He is oriented to person, place, and time. No distress.  Eyes: Conjunctivae are normal.  Neck: Neck supple. No JVD present. No thyromegaly present.  Cardiovascular: Normal rate, regular rhythm and intact distal pulses.   Respiratory: Effort normal. No respiratory distress. He has no wheezes.  Is on 02 at 3L Breath sounds diminished in bilateral bases   GI: Soft. Bowel sounds are normal. He exhibits no distension. There is no tenderness.  Musculoskeletal: He exhibits edema.  Able to move all extremities  Has 1+ bilateral lower extremity edema   Lymphadenopathy:    He has no cervical adenopathy.  Neurological: He is alert and oriented to person, place, and time.  Skin: Skin is warm and dry. He is not diaphoretic.  Psychiatric: He has a normal mood and affect.     ASSESSMENT/ PLAN:  Patient is being discharged with the following home health services:  Pt/ot/rn/cna: to evaluate and treat as indicated for gait balance strength adl training medication management and adl care  Patient is being discharged with the following durable medical equipment:  02 at 3L/[place] continuous; his room air 02 sat 80%; with 02: sat is 95%. Will need a standard wheelchair with elevated leg rests; cushion; anti-tippers; brake extensions to allow him to maintain his current level of independence with his adls which cannot be achieved with a walker; he can self propel.    Patient has been advised to f/u with their PCP in 1-2 weeks to bring them  up to date on their rehab stay.  Social services at facility was responsible for arranging this appointment.  Pt was provided with a 30 day  supply of prescriptions for medications and refills must be obtained from their PCP.  For controlled substances, a more limited supply may be provided adequate until PCP appointment only. 50 cc tussionex    Time spent with patient  45  minutes >50% time spent counseling; reviewing medical record; tests; labs; and developing future plan of care    Ok Edwards NP [facility]  Contact [phone] Monday through Friday 8am- 5pm  After hours call [phone]

## 2016-08-16 ENCOUNTER — Other Ambulatory Visit: Payer: Self-pay | Admitting: Emergency Medicine

## 2016-08-16 DIAGNOSIS — J449 Chronic obstructive pulmonary disease, unspecified: Secondary | ICD-10-CM

## 2016-08-16 MED ORDER — UNABLE TO FIND: 0 refills | Status: AC

## 2016-08-16 NOTE — Telephone Encounter (Signed)
Would guess they can try some of the other dme's to see if they can purchase one from them [facility] [id number]

## 2016-08-16 NOTE — Telephone Encounter (Signed)
Spoke with pt's wife, aware of recs.  Pt's wife has already reached out to [place].  Nothing further needed.

## 2016-08-16 NOTE — Telephone Encounter (Signed)
lmtcb X1 for pt  

## 2016-08-16 NOTE — Telephone Encounter (Signed)
Pt wife returning call and can be reached @[phone].Hillery Hunter

## 2016-08-16 NOTE — Telephone Encounter (Signed)
Order faxed for portable oxygen concentrator and last office note to [place] at [phone]; contact person is [place] at [phone]

## 2016-08-21 ENCOUNTER — Ambulatory Visit (HOSPITAL_COMMUNITY)
Admission: RE | Admit: 2016-08-21 | Discharge: 2016-08-21 | Disposition: A | Discharge status: Home / Self Care | Payer: Medicare HMO | Source: Ambulatory Visit | Attending: Internal Medicine | Admitting: Internal Medicine

## 2016-08-21 ENCOUNTER — Ambulatory Visit: Payer: Medicare HMO | Admitting: Internal Medicine

## 2016-08-21 VITALS — BP 90/46 | HR 94 | Wt 175.0 lb

## 2016-08-21 DIAGNOSIS — Z9081 Acquired absence of spleen: Secondary | ICD-10-CM | POA: Diagnosis not present

## 2016-08-21 DIAGNOSIS — Z800 Family history of malignant neoplasm of digestive organs: Secondary | ICD-10-CM | POA: Insufficient documentation

## 2016-08-21 DIAGNOSIS — I5033 Acute on chronic diastolic (congestive) heart failure: Secondary | ICD-10-CM | POA: Insufficient documentation

## 2016-08-21 DIAGNOSIS — E785 Hyperlipidemia, unspecified: Secondary | ICD-10-CM | POA: Insufficient documentation

## 2016-08-21 DIAGNOSIS — I959 Hypotension, unspecified: Secondary | ICD-10-CM | POA: Diagnosis not present

## 2016-08-21 DIAGNOSIS — I5032 Chronic diastolic (congestive) heart failure: Secondary | ICD-10-CM

## 2016-08-21 DIAGNOSIS — J9611 Chronic respiratory failure with hypoxia: Secondary | ICD-10-CM

## 2016-08-21 DIAGNOSIS — Z888 Allergy status to other drugs, medicaments and biological substances status: Secondary | ICD-10-CM | POA: Diagnosis not present

## 2016-08-21 DIAGNOSIS — Z87891 Personal history of nicotine dependence: Secondary | ICD-10-CM | POA: Diagnosis not present

## 2016-08-21 DIAGNOSIS — Z794 Long term (current) use of insulin: Secondary | ICD-10-CM | POA: Diagnosis not present

## 2016-08-21 DIAGNOSIS — I471 Supraventricular tachycardia, unspecified: Secondary | ICD-10-CM

## 2016-08-21 DIAGNOSIS — Z87442 Personal history of urinary calculi: Secondary | ICD-10-CM | POA: Insufficient documentation

## 2016-08-21 DIAGNOSIS — J449 Chronic obstructive pulmonary disease, unspecified: Secondary | ICD-10-CM | POA: Insufficient documentation

## 2016-08-21 DIAGNOSIS — Z8249 Family history of ischemic heart disease and other diseases of the circulatory system: Secondary | ICD-10-CM | POA: Diagnosis not present

## 2016-08-21 DIAGNOSIS — E119 Type 2 diabetes mellitus without complications: Secondary | ICD-10-CM | POA: Diagnosis not present

## 2016-08-21 DIAGNOSIS — Z86010 Personal history of colonic polyps: Secondary | ICD-10-CM | POA: Diagnosis not present

## 2016-08-21 DIAGNOSIS — J90 Pleural effusion, not elsewhere classified: Secondary | ICD-10-CM | POA: Diagnosis not present

## 2016-08-21 DIAGNOSIS — I110 Hypertensive heart disease with heart failure: Secondary | ICD-10-CM | POA: Diagnosis not present

## 2016-08-21 DIAGNOSIS — D693 Immune thrombocytopenic purpura: Secondary | ICD-10-CM | POA: Diagnosis not present

## 2016-08-21 DIAGNOSIS — R918 Other nonspecific abnormal finding of lung field: Secondary | ICD-10-CM | POA: Insufficient documentation

## 2016-08-21 LAB — BASIC METABOLIC PANEL
ANION GAP: 9 (ref 5–15)
BUN: 14 mg/dL (ref 6–20)
CALCIUM: 8.4 mg/dL — AB (ref 8.9–10.3)
CO2: 38 mmol/L — ABNORMAL HIGH (ref 22–32)
Chloride: 87 mmol/L — ABNORMAL LOW (ref 101–111)
Creatinine, Ser: 0.64 mg/dL (ref 0.61–1.24)
GLUCOSE: 207 mg/dL — AB (ref 65–99)
Potassium: 4.1 mmol/L (ref 3.5–5.1)
Sodium: 134 mmol/L — ABNORMAL LOW (ref 135–145)

## 2016-08-21 MED ORDER — POTASSIUM CHLORIDE CRYS ER 20 MEQ PO TBCR: 40.0000 meq | EXTENDED_RELEASE_TABLET | Freq: Every day | ORAL | 3 refills | Status: AC

## 2016-08-21 MED ORDER — CARVEDILOL 3.125 MG PO TABS: 3.1250 mg | ORAL_TABLET | Freq: Two times a day (BID) | ORAL | 3 refills | Status: AC

## 2016-08-21 MED ORDER — FUROSEMIDE 20 MG PO TABS: 60.0000 mg | ORAL_TABLET | Freq: Every day | ORAL | 3 refills | Status: AC

## 2016-08-21 NOTE — Patient Instructions (Addendum)
Labs today (will call for abnormal results, otherwise no news is good news)  Chest Xray today.  STOP Losartan  DECREASE Coreg to 3.125 mg (1 Tablet) Twice Daily  Take Lasix 60 mg (3 tablets) in the AM, 40 mg (2 Tablets) in the PM for 4 DAYS ONLY.  Then resume 60 mg Daily.  START taking Potassium 40 mEq (2 Tablets) Once Daily  Follow up appointment has been made for Tuesday May 1st @ 11:00 AM.   Please call our office if you start feeling dizzy/orthostatic.

## 2016-08-21 NOTE — Progress Notes (Signed)
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   Admitted 06/10/16-06/20/16 with bilateral pleural effusions, had thoracentesis on 2/14. Also had PSVT and Amio was started however, it was felt that the PSVT was likely related to his acute illness so Amio was discontinued. Diuresed with IV lasix. Discharge weight was 193 pounds.   Admitted 07/08/16-08/02/16 with SVT, ER performed DCCV as he was hypotensive and hypoxic. He was started on Cardizem and Amiodarone gtts. Also had severe refractory ITP, oncology recommended splenectomy which was preformed on 07/13/16. Had left thoracentesis on 07/12/16 and cultures were positive for pseudomonas. He was intubated due to respiratory failure, also had bilateral chest tubes placed. He received TPA/DNAse due to loculated pleural effusion, chest tube removed on 3/25. He was discharged to SNF. His discharge weight was 184 pounds. Discharged with 3.125mg  Coreg, 60mg  Lasix daily, and losartan 50mg  daily.   He presents today for HF evaluation. Weight is 175 pounds, down 10 pounds from when he was discharged. He feels terrible, SOB at rest. Reports orthopnea. He has a productive cough. He is residing at [facility], plan is for him to go home in 2 days. He is taking all his medications with compliance, weights at SNF are stable in the 174-175 pounds range. He is not able to eat much, says that he doesn't have much of an appetite. Drinking less than 2L a day.   Review of Systems: [y] = yes, [ ]  = no    General: Weight gain [] ; Weight loss [ y]; Anorexia [ ] ; Fatigue [y]; Fever [ ] ; Chills [ ] ; Weakness Blue.Reese ]   Cardiac: Chest pain/pressure [ ] ; Resting SOB [ y]; Exertional SOB [y]; Orthopnea [ ] ; Pedal  Edema [y]; Palpitations [ ] ; Syncope [ ] ; Presyncope [ ] ; Paroxysmal nocturnal dyspnea[ ]    Pulmonary: Cough Blue.Reese ]; Wheezing[ ] ; Hemoptysis[ ] ; Sputum [ ] ; Snoring [ ]    GI: Vomiting[ ] ; Dysphagia[ ] ; Melena[ ] ; Hematochezia [ ] ; Heartburn[ ] ; Abdominal pain [ ] ; Constipation [ ] ; Diarrhea [ ] ; BRBPR [ ]    GU: Hematuria[ ] ; Dysuria [ ] ; Nocturia[ ]   Vascular: Pain in legs with walking [ ] ; Pain in feet with lying flat [ ] ; Non-healing sores [ ] ; Stroke [ ] ; TIA [ ] ; Slurred speech [ ] ;   Neuro: Headaches[ ] ; Vertigo[ ] ; Seizures[ ] ; Paresthesias[ ] ;Blurred vision [ ] ; Diplopia [ ] ; Vision changes [ ]    Ortho/Skin: Arthritis [y]; Joint pain [y]; Muscle pain [ ] ; Joint swelling [ ] ; Back Pain [ ] ; Rash [ ]    Psych: Depression[ ] ; Anxiety[ ]    Heme: Bleeding problems [ ] ; Clotting disorders [ ] ; Anemia [ ]    Endocrine: Diabetes [ ] ; Thyroid dysfunction[ ]    Past Medical History:  Diagnosis Date  . Adenomatous colon polyp    01/2006  . Chronic pansinusitis   . COPD (chronic obstructive pulmonary disease) ([place])   . Diverticula of colon   . History of ITP   . History of nephrolithiasis   . Hyperlipemia   . Hypertension   . 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 Pleural effusion   . Sinus drainage    Cough with post nasal drip  . Thrombocytopenia (  [place])   . Type 2 diabetes mellitus ([place])     Current Outpatient Prescriptions  Medication Sig Dispense Refill  . albuterol (PROAIR HFA) 108 (90 Base) MCG/ACT inhaler 2 puffs every 4 hours as needed only  if your can't catch your breath 1 Inhaler 11  . Amino Acids-Protein Hydrolys (FEEDING SUPPLEMENT, PRO-STAT SUGAR FREE 64,) LIQD Take 30 mLs by mouth 2 (two) times daily.    Marland Kitchen aminocaproic acid (AMICAR) 500 MG tablet Take 2 tablets (1,000 mg total) by mouth every 8 (eight) hours.    . budesonide (PULMICORT) 0.5 MG/2ML nebulizer solution Take 2 mLs (0.5 mg total) by nebulization 2 (two) times daily. 220 mL 0  . carvedilol (COREG) 6.25 MG  tablet Take 1 tablet (6.25 mg total) by mouth 2 (two) times daily with a meal.    . chlorpheniramine-HYDROcodone (TUSSIONEX) 10-8 MG/5ML SUER Take 5 mLs by mouth at bedtime as needed for cough. 115 mL 0  . ECHINACEA EXTRACT PO Take 1 capsule by mouth daily.    . famotidine (PEPCID) 20 MG tablet Take 20 mg by mouth at bedtime.    . fexofenadine (ALLEGRA) 180 MG tablet Take 180 mg by mouth daily.     . furosemide (LASIX) 20 MG tablet Take 3 tablets (60 mg total) by mouth daily.    Marland Kitchen GINKGO BILOBA PO Take 1 tablet by mouth daily.     . Glucosamine-Chondroit-Vit C-Mn (GLUCOSAMINE 1500 COMPLEX PO) Take 1,500 mg by mouth 2 (two) times daily.    . Insulin Glargine 300 UNIT/ML SOPN Inject 15 unit subcutaneously at bedtime for DM    . ipratropium (ATROVENT) 0.02 % nebulizer solution Take 2.5 mLs (0.5 mg total) by nebulization 3 (three) times daily. 75 mL 12  . losartan (COZAAR) 25 MG tablet Take 25 mg by mouth daily.    . magnesium oxide (MAG-OX) 400 MG tablet Take 400 mg by mouth daily.    . metFORMIN (GLUCOPHAGE) 1000 MG tablet Take 1,000 mg by mouth 2 (two) times daily with a meal.     . Multiple Vitamins-Minerals (DECUBI-VITE) CAPS Give 1 dose by mouth one time a day for wound healing    . Multiple Vitamins-Minerals (MULTIVITAMIN PO) Take 1 tablet by mouth daily.     . pantoprazole (PROTONIX) 40 MG tablet Take 1 tablet (40 mg total) by mouth daily. Take 30-60 min before first meal of the day 30 tablet 2  . predniSONE (DELTASONE) 10 MG tablet Take 1 tablet (10 mg total) by mouth daily with breakfast. 30 tablet 0  . simvastatin (ZOCOR) 80 MG tablet Take 80 mg by mouth daily.     Marland Kitchen UNABLE TO FIND Med Pass Sugar Free - 240 cc two times daily    . UNABLE TO FIND Portable oxygen concentrator  Diagnosis code; J44.9 1 Units 0   No current facility-administered medications for this encounter.     Allergies  Allergen Reactions  . Heparin Other (See Comments)    Heparin-induced  thrombocytopenia/thrombosis      Social History   Social History  . Marital status: Married    Spouse name: N/A  . Number of children: N/A  . Years of education: N/A   Occupational History  . Not on file.   Social History Main Topics  . Smoking status: Former Smoker    Packs/day: 1.00    Years: 42.00    Types: Cigarettes    Start date: 05/02/1959    Quit date: 05/01/2001  . Smokeless tobacco: Never  Used  . Alcohol use Yes     Comment: social  . Drug use: No  . Sexual activity: Not on file   Other Topics Concern  . 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 Denies any pets currently. No bird or mold exposure. No recent travel.            Family History  Problem Relation Age of Onset  . Colon cancer Mother   . Heart disease Mother   . Liver cancer Father   . Heart disease Father     Vitals:   08/21/16 1111  BP: (!) 90/46  Pulse: 94  SpO2: 91%  Weight: 175 lb (79.4 kg)     PHYSICAL EXAM: General: Elderly male, SOB, cannot speak more than 2 words without stopping for breath.  HEENT: Normal, atraumatic.  Neck: supple. 10 cm JVP. Carotids 2+ bilat; no bruits. No lymphadenopathy or thyromegaly appreciated. Cor: PMI nondisplaced. Regular rate & rhythm. No rubs, gallops or murmurs. Lungs: Diminished in bilateral bases, diffuse rhonchi. + productive cough. RLL diminished.  Abdomen: soft, nontender, nondistended. No hepatosplenomegaly. No bruits or masses. Good bowel sounds. Extremities: no cyanosis, clubbing, rash. Warm. +1 pedal edema.  Neuro: alert & oriented x 3, cranial nerves grossly intact. moves all 4 extremities w/o difficulty. Affect pleasant.   ASSESSMENT & PLAN: 1. Acute on chronic diastolic CHF: Echo in 08/5730 with EF 20-25%, grade 2 diastolic dysfunction - NYHA III - REDs vest 42.  - Increase Lasix to 60mg  in the am, 40mg  in the pm for 4 days Then resume 60mg  daily.  -  Start KCl 34mEq daily.  - BMET today - Decrease Coreg to 3.125mg  BID.  - Stop losartan 25mg  daily as his SBP is 90.  2. 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 [name].  - Plts  68K. No active bleeding 3. COPD/Bronchiectasis  - PFT's in Dec. 2017 with [place] 48% - Followed by Dr. Melvyn Novas. Will see him on May 4th. Plan for repeat CXR.  4. HTN - Hypotensive 5. Bilateral pleural effusions --s/p recent bilateral chest tubes 6. DM - Per primary care  7. History of PSVT - Quiescent  - Continue Coreg.   Follow up next week in the APP clinic to check volume status. Will need BMET next week.   Arbutus Leas, NP 08/21/16   Patient seen and examined with Jettie Booze, NP. We discussed all aspects of the encounter. I agree with the assessment and plan as stated above.   Overall Mr. Millhouse is quite ill and debilitated. He has recently been discharged after prolonged hospitalization for splenectomy and respiratoryfailure with bilateral pleural effusions requiring chest tube drainage. I have reviewed his echo, PFTs and chest CTs personally.   Currently he does have some mid fluid overload but I suspect his dyspnea and severe functional limitation is multifactorial. His PFTs are quite abnormal and I suspect his intrinsic lung disease is also playing a significant role in his current symptoms. We will repeat his CXR and proceed with increasing his lasix to facilitate diuresis. Given low BP will stop losartan and cut carvedilol back.   Currently in NSR and no evidence of recurrent SVT. If SVT recurs can consider referral for ablation but doubt that it will be necessary and suspect he may not be candidate at this point.   We will see back in Clinic neck week to  reassess volume status. Will make sure he has HHPT. He has f/u with Dr. Melvyn Novas in Pulmonary this week as well.   Glori Bickers, MD  6:42 PM

## 2016-08-21 NOTE — Progress Notes (Signed)
    ReDS Vest - 08/21/16 1100      ReDS Vest   MR  No   Estimated volume prior to reading Med   Fitting Posture Standing   Height Marker Tall   Ruler Value 12   Center Strip Aligned   ReDS Value 42

## 2016-08-22 ENCOUNTER — Ambulatory Visit (HOSPITAL_BASED_OUTPATIENT_CLINIC_OR_DEPARTMENT_OTHER): Payer: Medicare HMO

## 2016-08-22 VITALS — BP 118/67 | HR 100 | Temp 98.1°F | Resp 22

## 2016-08-22 DIAGNOSIS — D693 Immune thrombocytopenic purpura: Secondary | ICD-10-CM

## 2016-08-22 MED ORDER — ROMIPLOSTIM 250 MCG ~~LOC~~ SOLR
1.0000 ug/kg | Freq: Once | SUBCUTANEOUS | Status: AC
  Administered 2016-08-22: 80 ug via SUBCUTANEOUS
  Filled 2016-08-22: qty 0.16

## 2016-08-22 NOTE — Patient Instructions (Signed)
Romiplostim injection What is this medicine? ROMIPLOSTIM (roe mi PLOE stim) helps your body make more platelets. This medicine is used to treat low platelets caused by chronic idiopathic thrombocytopenic purpura (ITP). This medicine may be used for other purposes; ask your health care provider or pharmacist if you have questions. COMMON BRAND NAME(S): Nplate What should I tell my health care provider before I take this medicine? They need to know if you have any of these conditions: -cancer or myelodysplastic syndrome -low blood counts, like low white cell, platelet, or red cell counts -take medicines that treat or prevent blood clots -an unusual or allergic reaction to romiplostim, mannitol, other medicines, foods, dyes, or preservatives -pregnant or trying to get pregnant -breast-feeding How should I use this medicine? This medicine is for injection under the skin. It is given by a health care professional in a hospital or clinic setting. A special MedGuide will be given to you before your injection. Read this information carefully each time. Talk to your pediatrician regarding the use of this medicine in children. Special care may be needed. Overdosage: If you think you have taken too much of this medicine contact a poison control center or emergency room at once. NOTE: This medicine is only for you. Do not share this medicine with others. What if I miss a dose? It is important not to miss your dose. Call your doctor or health care professional if you are unable to keep an appointment. What may interact with this medicine? Interactions are not expected. This list may not describe all possible interactions. Give your health care provider a list of all the medicines, herbs, non-prescription drugs, or dietary supplements you use. Also tell them if you smoke, drink alcohol, or use illegal drugs. Some items may interact with your medicine. What should I watch for while using this  medicine? Your condition will be monitored carefully while you are receiving this medicine. Visit your prescriber or health care professional for regular checks on your progress and for the needed blood tests. It is important to keep all appointments. What side effects may I notice from receiving this medicine? Side effects that you should report to your doctor or health care professional as soon as possible: -allergic reactions like skin rash, itching or hives, swelling of the face, lips, or tongue -shortness of breath, chest pain, swelling in a leg -unusual bleeding or bruising Side effects that usually do not require medical attention (report to your doctor or health care professional if they continue or are bothersome): -dizziness -headache -muscle aches -pain in arms and legs -stomach pain -trouble sleeping This list may not describe all possible side effects. Call your doctor for medical advice about side effects. You may report side effects to FDA at 1-800-FDA-1088. Where should I keep my medicine? This drug is given in a hospital or clinic and will not be stored at home. NOTE: This sheet is a summary. It may not cover all possible information. If you have questions about this medicine, talk to your doctor, pharmacist, or health care provider.  2018 Elsevier/Gold Standard (2007-12-16 15:13:04)  

## 2016-08-24 DIAGNOSIS — J449 Chronic obstructive pulmonary disease, unspecified: Secondary | ICD-10-CM

## 2016-08-24 DIAGNOSIS — Z9981 Dependence on supplemental oxygen: Secondary | ICD-10-CM | POA: Diagnosis not present

## 2016-08-24 DIAGNOSIS — J479 Bronchiectasis, uncomplicated: Secondary | ICD-10-CM | POA: Diagnosis not present

## 2016-08-24 DIAGNOSIS — Z794 Long term (current) use of insulin: Secondary | ICD-10-CM | POA: Diagnosis not present

## 2016-08-24 DIAGNOSIS — I10 Essential (primary) hypertension: Secondary | ICD-10-CM | POA: Diagnosis not present

## 2016-08-24 DIAGNOSIS — E119 Type 2 diabetes mellitus without complications: Secondary | ICD-10-CM | POA: Diagnosis not present

## 2016-08-24 DIAGNOSIS — I479 Paroxysmal tachycardia, unspecified: Secondary | ICD-10-CM | POA: Diagnosis not present

## 2016-08-24 NOTE — Telephone Encounter (Signed)
Oral Chemotherapy Pharmacist Encounter  Received notification from [employer] that patient has been approved to receive Promacta at $0 out of pocket cost from the manufacturer until 04/30/17. Noted patient on  Nplate therapy. Per MD, continuing Nplate for now and do not need Promacta.  I called [employer] to cancel application.  They instead will place prescription on hold but leave patient's account active incase he does require [employer] assistance before the end of the year. We will need to provide a new prescription for Promacta to [employer] if needed at some point in the future.  MD will be notified.  No further needs from [facility] identified at this time. Oral Oncology Clinic will sign off. Please let us know if we can be of assistance in the future.   Johny Drilling, PharmD, BCPS, BCOP 08/24/2016  1:08 PM Oral Oncology Clinic [phone]

## 2016-08-25 ENCOUNTER — Ambulatory Visit (INDEPENDENT_AMBULATORY_CARE_PROVIDER_SITE_OTHER)
Admission: RE | Admit: 2016-08-25 | Discharge: 2016-08-25 | Disposition: A | Discharge status: Home / Self Care | Payer: Medicare HMO | Source: Ambulatory Visit | Attending: Internal Medicine | Admitting: Internal Medicine

## 2016-08-25 ENCOUNTER — Ambulatory Visit (INDEPENDENT_AMBULATORY_CARE_PROVIDER_SITE_OTHER): Payer: Medicare HMO | Admitting: Internal Medicine

## 2016-08-25 ENCOUNTER — Encounter: Payer: Self-pay | Admitting: Internal Medicine

## 2016-08-25 VITALS — BP 100/60 | HR 89 | Ht 71.0 in | Wt 163.0 lb

## 2016-08-25 DIAGNOSIS — J479 Bronchiectasis, uncomplicated: Secondary | ICD-10-CM

## 2016-08-25 DIAGNOSIS — J9612 Chronic respiratory failure with hypercapnia: Secondary | ICD-10-CM | POA: Diagnosis not present

## 2016-08-25 DIAGNOSIS — J9611 Chronic respiratory failure with hypoxia: Secondary | ICD-10-CM

## 2016-08-25 DIAGNOSIS — J449 Chronic obstructive pulmonary disease, unspecified: Secondary | ICD-10-CM

## 2016-08-25 MED ORDER — CIPROFLOXACIN HCL 750 MG PO TABS: 750.0000 mg | ORAL_TABLET | Freq: Two times a day (BID) | ORAL | 11 refills | Status: AC

## 2016-08-25 NOTE — Progress Notes (Signed)
@Patient  ID: [name], male    DOB: 1940/08/09, 76 y.o.   MRN: [id number]     HPI: 76 yo male former smoker followed for COPD and Bronchiectasis complicated by Diastolic CHF , severe thrombocytopenia and recurrent pleural effusions (exudative)      08/08/2016  NP  Follow up : COPD/O2 RF /Post hospital  Pt returns for a post hospital follow up . He had 2  prolonged hospitalizations for  Recurrent pleural effusions, refractory thrombocytopenia, Psuedomonas PNA, empyema , acute on chronic resp failure .  Initially admitted in Feb 2018 with with pleural effusion s/p throacentesis /diuresis - exudative effusion . He returns shortly after discharge in early March with SVT requiring cardioversion. He had refractory ITP , Hematology consulted with recommendations for splenectomy , he underwent this on 3/15. Pt had persistent left pleural effusion and thoracentesis done on 3/14 was + for psuedomonas. He required  intubation after splenectomy for few days and was extubated on 3/22. He then required bilateral chest tubes placed on 3/16 w/ TPA/DNA due to loculated pleural effusion . Chest tubes were removed on 3/25. CT chest on 3/27 showed persistent consolidation . Chest PT was continued as well as aggressive pulmonary regimen . He was treated with aggressive IV ABX  Completed CIpro course on 3/30.  He continued to have ITP issues,  HIT was ruled out. He was continued on home Amnicar . (he did require 4 Platelets/4 PRBC. He is being followed closely at Hematology .   Since discharge to rehab center , he is slightly better, making very slow progress. Remains weak, low energy. Still has rattling cough and congestion  rec Continue on current regimen  Continue on Flutter valve.        08/25/2016  f/u ov/Conor Lata re: bronchiectasis rx bud/duoneb qid/ confused with intructions  Chief Complaint  Patient presents with  . Follow-up    Pt states breathing is at his normal baseline. His sats at rest on 3lpm  pulsed o2 are 80%--increased to 91% on 3lpm pulsed o2 with pursed lip breathing   overall doing about the same, very weak is the main problem Congested cough but not using flutter valve/ mucus is white   No sob at rest on 3lpm   No obvious day to day or daytime variability or assoc  purulent sputum or mucus plugs or hemoptysis or cp or chest tightness, subjective wheeze or overt sinus or hb symptoms. No unusual exp hx or h/o childhood pna/ asthma or knowledge of premature birth.  Sleeping ok without nocturnal  or early am exacerbation  of respiratory  c/o's or need for noct saba. Also denies any obvious fluctuation of symptoms with weather or environmental changes or other aggravating or alleviating factors except as outlined above   Current Medications, Allergies, Complete Past Medical History, Past Surgical History, Family History, and Social History were reviewed in [employer] record.  ROS  The following are not active complaints unless bolded sore throat, dysphagia, dental problems, itching, sneezing,  nasal congestion or excess/ purulent secretions, ear ache,   fever, chills, sweats, unintended wt loss, classically pleuritic or exertional cp,  orthopnea pnd or leg swelling, presyncope, palpitations, abdominal pain, anorexia, nausea, vomiting, diarrhea  or change in bowel or bladder habits, change in stools or urine, dysuria,hematuria,  rash, arthralgias, visual complaints, headache, numbness, weakness or ataxia or problems with walking or coordination,  change in mood/affect or memory.           Physical  Exam  Wt Readings from Last 3 Encounters:  08/25/16 163 lb (73.9 kg)  08/21/16 175 lb (79.4 kg)  08/15/16 176 lb 3.2 oz (79.9 kg)    Vital signs reviewed - Note on arrival 02 sats  93% on 3lpm      GEN: A/Ox3; pleasant , NAD, chronically ill appearing in wc/ very rattling sounding cough    HEENT:  [place]/AT,  EACs-clear, TMs-wnl, NOSE-clear, THROAT-clear, no  lesions, no postnasal drip or exudate noted.   NECK:  Supple w/ fair ROM; no JVD; normal carotid impulses w/o bruits; no thyromegaly or nodules palpated; no lymphadenopathy.    RESP  Decreased BS In bases , junky insp and exp rhonchi sym bilaterally   CARD:  RRR, no m/r/g,  2+ pitting bilateral sym lower ext  edema, pulses intact, no cyanosis or clubbing.  GI:   Soft & nt; nml bowel sounds; no organomegaly or masses detected.   Musco: Warm bil, no deformities or joint swelling noted.   Neuro: alert, no focal deficits noted.    Skin: Warm, no lesions or rashes    CXR PA and Lateral:   08/25/2016 :    I personally reviewed images and agree with radiology impression as follows:    Persistent bilateral infiltrates with no interval change           Assessment & Plan:

## 2016-08-25 NOTE — Patient Instructions (Addendum)
Plan A = Automatic = Budesonide twice daily and Ipatropium/albuterol 2.5  Four times daily   Plan B = Backup Only use your albuterol as a rescue medication to be used if you can't catch your breath by resting or doing a relaxed purse lip breathing pattern.  - The less you use it, the better it will work when you need it. - Ok to use the inhaler up to 2 puffs  every 4 hours if you must but call for appointment if use goes up over your usual need - Don't leave home without it !!  (think of it like the spare tire for your car)   If mucus gets nasty and stays nasty > cipro 750 mg twice daily  X 10 days    see Tammy NP w/in 2 weeks with all your medications, even over the counter meds, separated in two separate bags, the ones you take no matter what vs the ones you stop once you feel better and take only as needed when you feel you need them.   Tammy  will generate for you a new user friendly medication calendar that will put [place] all on the same page re: your medication use.     Without this process, it simply isn't possible to assure that we are providing  your outpatient care  with  the attention to detail we feel you deserve.   If we cannot assure that you're getting that kind of care,  then we cannot manage your problem effectively from this clinic.  Once you have seen Tammy and we are sure that we're all on the same page with your medication use she will arrange follow up with me.    Please remember to go to the x-ray department downstairs in the basement  for your tests - we will call you with the results when they are available.

## 2016-08-27 DIAGNOSIS — J9611 Chronic respiratory failure with hypoxia: Secondary | ICD-10-CM | POA: Insufficient documentation

## 2016-08-27 DIAGNOSIS — J9612 Chronic respiratory failure with hypercapnia: Secondary | ICD-10-CM

## 2016-08-27 NOTE — Assessment & Plan Note (Signed)
-   PFTs 03/26/2012 FEV1  1.36 (45%) Ratio 55 and no better p B2, DLCO 65% - Spirometry 01/28/16   FEV1 0.82 (27%)  Ratio 60% . 02/02/2016   changed bevespi to symbicort 80  - 02/17/2016  After extensive coaching HFA effectiveness =    90% > try symbiocrt 160 2bid  - PFT's  04/04/2016  FEV1 1.05 (32 % ) ratio 61  p 3  % improvement from saba p symb 160  prior to study with DLCO  48/49  % corrects to 93  % for alv volume   - 04/04/2016  After extensive coaching HFA effectiveness =    90% try stiolto  - Spirometry 05/23/2016  FEV1 1.30  (42%)  Ratio 61  On stiolto 2 each am  - 05/23/2016  Walked RA x 3 laps @ 185 ft each stopped due to  Sob/ desats to 86% (see resp failure)  - changed to duoneb/pulmocort 07/2016 p prolonged admit    Considering all he's been through I'm really impressed he's doing so well but clearly severely debilitated at this point and will eventually need outpt pulmonary rehab if will go   For now, no change in rx needed but he and wife are in desperate need for med reconciliation:  To keep things simple, I have asked the patient to first separate medicines that are perceived as maintenance, that is to be taken daily "no matter what", from those medicines that are taken on only on an as-needed basis and I have given the patient examples of both, and then return to see our NP to generate a  detailed  medication calendar which should be followed until the next physician sees the patient and updates it.    I had an extended discussion with the patient reviewing all relevant studies completed to date(including extensive hosp records/images)  and  lasting 15 to 20 minutes of a 25 minute visit    Each maintenance medication was reviewed in detail including most importantly the difference between maintenance and prns and under what circumstances the prns are to be triggered using an action plan format that is not reflected in the computer generated alphabetically organized AVS.    Please see  AVS for specific instructions unique to this visit that I personally wrote and verbalized to the the pt in detail and then reviewed with pt  by my nurse highlighting any  changes in therapy recommended at today's visit to their plan of care.

## 2016-08-27 NOTE — Assessment & Plan Note (Signed)
HC03  08/21/16  =  38   Adequate control on present rx, reviewed in detail with pt > no change in rx needed  = 3lpm 24/7

## 2016-08-27 NOTE — Assessment & Plan Note (Addendum)
See CT chest 05/05/16   Reviewed need to use flutter as much as possible - apparently tried vest during admit and couldn't tolerate the one used there but may be able to get a different set up if he'll keep an open mind about this option  Given pseudomonas in pleural fluid most likely this is related to colonization of airways from bronchiectasis so ok to use short courses prn purulent sputum

## 2016-08-28 ENCOUNTER — Other Ambulatory Visit: Payer: Medicare HMO

## 2016-08-28 ENCOUNTER — Ambulatory Visit: Payer: Medicare HMO

## 2016-08-28 ENCOUNTER — Telehealth: Payer: Self-pay | Admitting: *Deleted

## 2016-08-28 ENCOUNTER — Ambulatory Visit: Payer: Medicare HMO | Admitting: Hematology and Oncology

## 2016-08-28 ENCOUNTER — Telehealth (HOSPITAL_COMMUNITY): Payer: Self-pay | Admitting: *Deleted

## 2016-08-28 NOTE — Progress Notes (Signed)
Pt is deceased. 

## 2016-08-28 NOTE — Assessment & Plan Note (Deleted)
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  Treatment summary: 1. High-dose prednisone: No improvement 2. S/P IVIG (lack of response) 3.Rituximab started 05/30/2016 weekly 4 (3 dose of Rituxan were given) 4. Cytoxan '75mg'$ /m2 PO  5. Splenectomy on 07/13/2016 6. Romiplostim 64mg/kg started on 07/30/2016 Bone marrow biopsy 05/30/2016: Findings suggestive of ITP with lots of megakaryocytes  Hospitalization: 06/10/2016- 06/20/2016 for respiratory distress due to large pleural effusion status post thoracentesis with platelet transfusion support (along with IVIG) Pleural fluid cytology: Benign 07/08/2016-08/02/2016: for SVT, severe refractory ITP, pt underwent to splenectomy, thoracentesis + pseudomonas--treated with antibiotics, was on vent for about a week, received bilateral chest tube placement, loculated pleural effusion, underwent chest physiotherapy, was followed by cardiology due to CHF/SVT, pulmonology due to pneumonia/COPD/bilateral pleural effusions, hydropneumothorax, ID due to infections, and oncology (ITP and to r/o HIT)during hospitalizations, platelets started dropping at end of hospitalization, so Romiplostim was given on 4/1, liver spleen scan was negative.   Lab review: Platelets 68 on 08/16/2016 Weekly for Nplate treatment with platelet count check I will see him back in 2 weeks  Respiratory distress:  He sees pulmonology and he will be seeing cardiology soon.

## 2016-08-28 NOTE — Telephone Encounter (Signed)
Pt's daughter called to let us know pt passed away on 07/31/2022.

## 2016-08-28 NOTE — Telephone Encounter (Signed)
FYI Voicemail received reporting this patient expired .  Request we cancel appointments scheduled for today.  Noted all appointments have been cancelled.  Called expressing sincere condolences to patient's son to share with family.

## 2016-08-29 ENCOUNTER — Encounter (HOSPITAL_COMMUNITY): Payer: Medicare HMO

## 2016-08-29 DEATH — deceased

## 2016-09-01 ENCOUNTER — Ambulatory Visit: Payer: Medicare HMO | Admitting: Internal Medicine

## 2016-09-05 ENCOUNTER — Encounter (HOSPITAL_COMMUNITY): Payer: Medicare HMO | Admitting: Internal Medicine

## 2016-09-09 ENCOUNTER — Other Ambulatory Visit: Payer: Self-pay | Admitting: Nurse Practitioner

## 2016-09-15 ENCOUNTER — Ambulatory Visit: Payer: Medicare HMO | Admitting: Internal Medicine

## 2018-04-03 IMAGING — CT CT CHEST W/ CM
2 of 5 series · 13 of 36 positions shown, 16 images · IV contrast (APPLIED)
Comparison: Multiple exams, including chest CT from 05/05/2016 and
abdominal CT from 11/22/2008

CLINICAL DATA: Is breath for 3-4 months. Pleural effusion. Acute
ITP.

EXAM:
CT CHEST, ABDOMEN, AND PELVIS WITH CONTRAST
TECHNIQUE: Multidetector CT imaging of the chest, abdomen and pelvis was
performed following the standard protocol during bolus
administration of intravenous contrast.
CONTRAST:  100mL [id number] IOPAMIDOL ([id number]) INJECTION 61%

[Series 2: cap with · axial · 0.97mm/px · z∈[-606,-21]mm · 10 of 144 slices shown, 13 images]
[im 14/144  mediastinal]
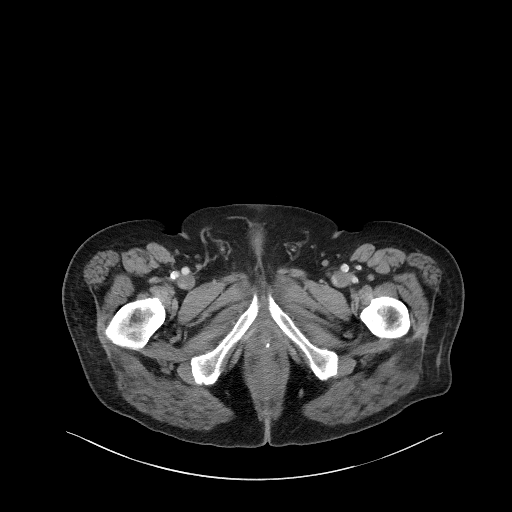
[im 14/144  lung]
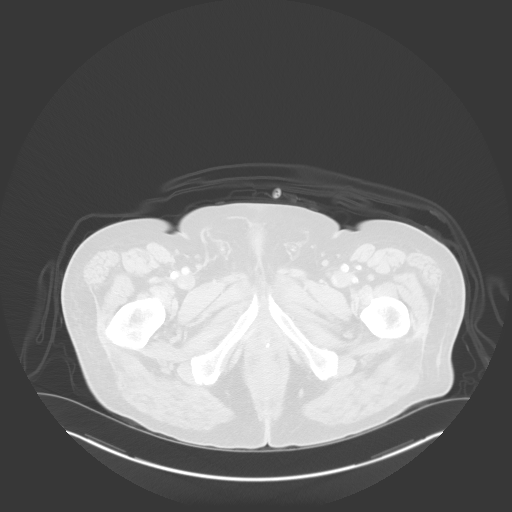
[im 27/144  lung]
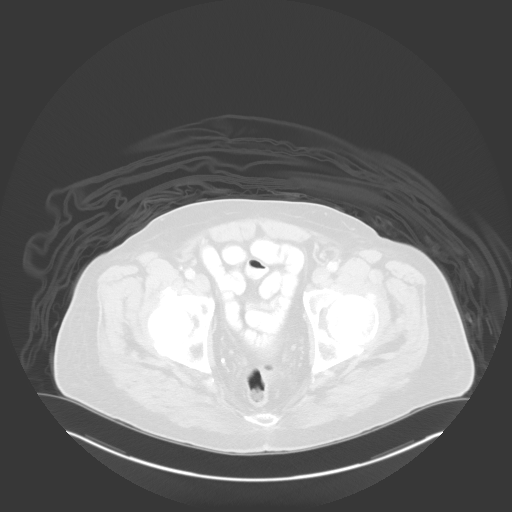
[im 40/144  lung]
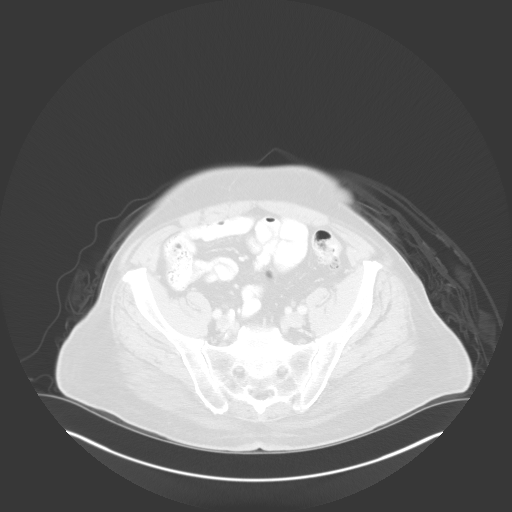
[im 53/144  lung]
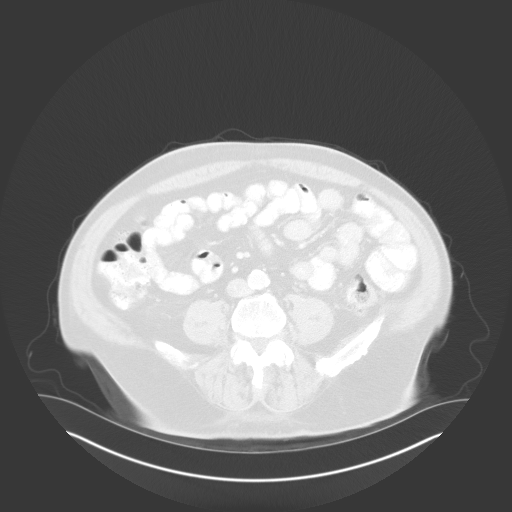
[im 66/144  mediastinal]
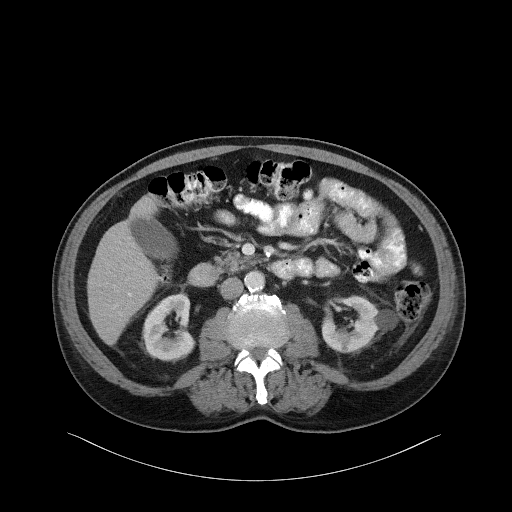
[im 66/144  lung]
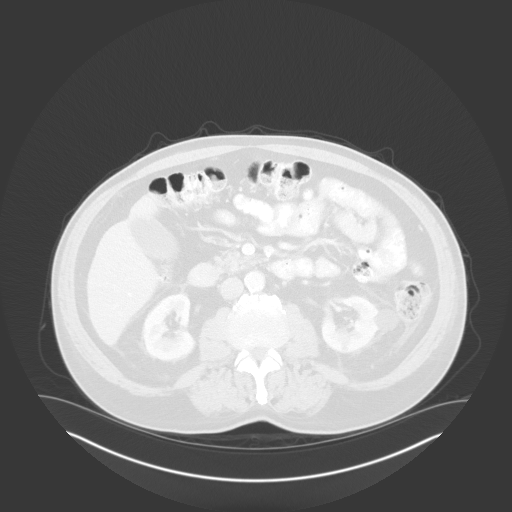
[im 79/144  lung]
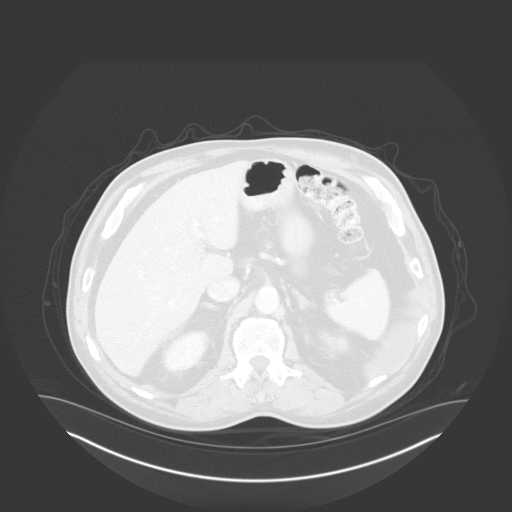
[im 92/144  lung]
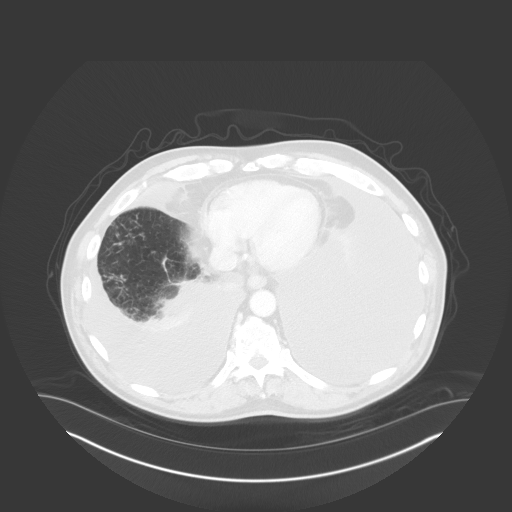
[im 105/144  lung]
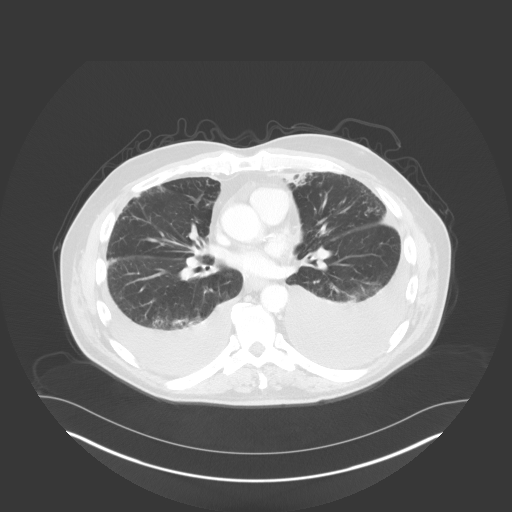
[im 118/144  mediastinal]
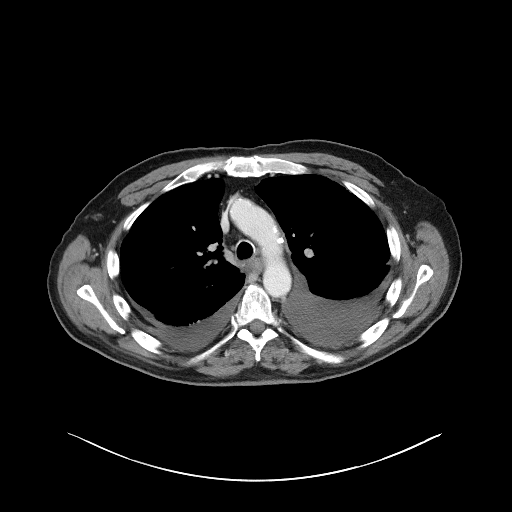
[im 118/144  lung]
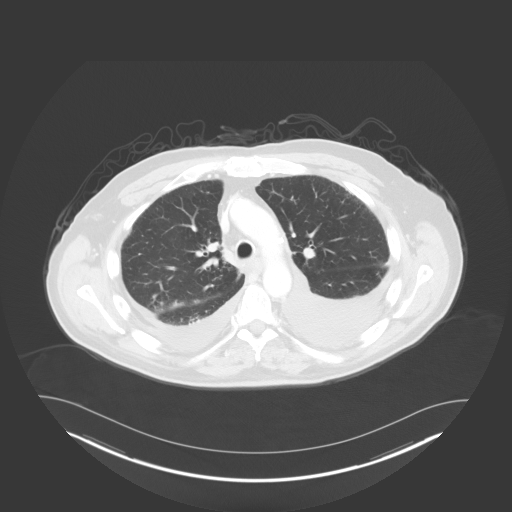
[im 131/144  lung]
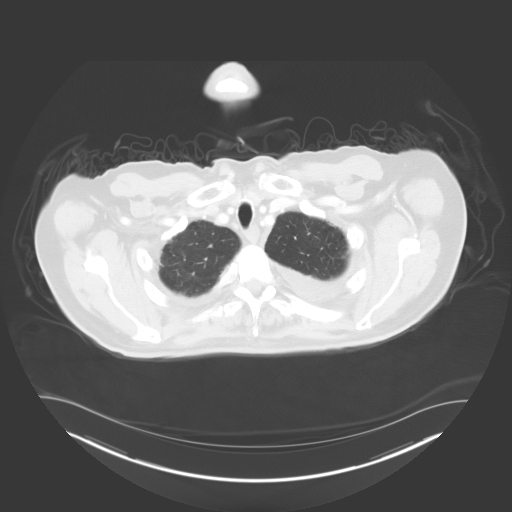

[Series 5: coronals · coronal · 0.88mm/px · 3 of 158 slices shown]
[im 32/158  lung]
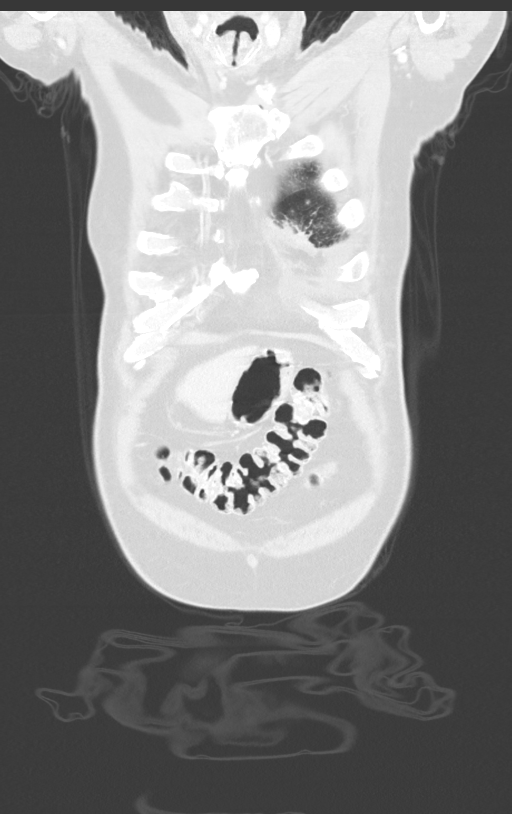
[im 63/158  lung]
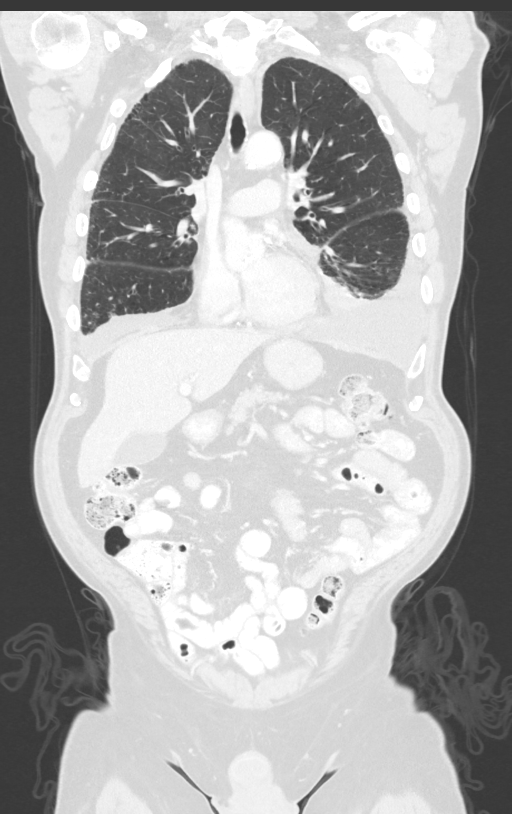
[im 95/158  lung]
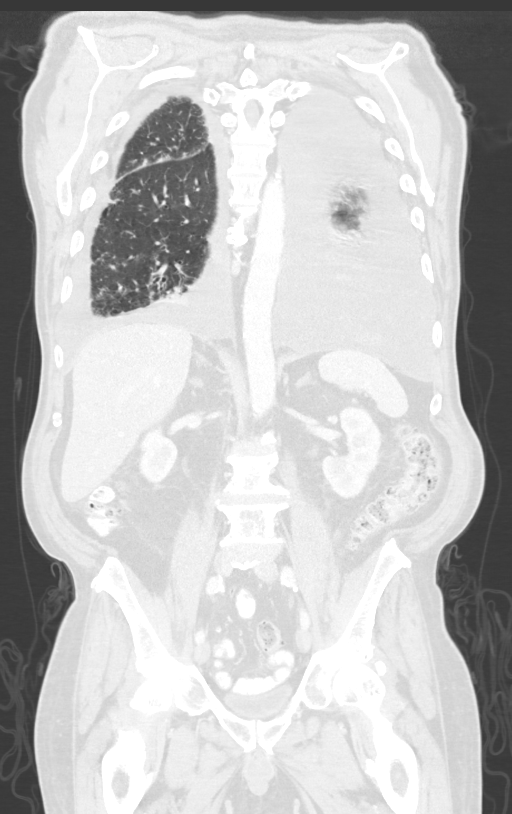

[13 of 36 positions shown; findings below may reference images not displayed]

FINDINGS: CT CHEST FINDINGS

Cardiovascular: Coronary, aortic arch, and branch vessel
atherosclerotic vascular disease.

Mediastinum/Nodes: Part of the patient's face was scanned and shows
bilateral maxillary sinusitis.

No pathologic adenopathy in the chest.

Lungs/Pleura: Large bilateral pleural effusions with passive
atelectasis. Nonspecific for transudative versus exudative etiology
but the effusions for the most part appear free-flowing.

Peripheral interstitial accentuation in both lungs. Probable
scarring scarring and slight nodularity scattered in the right upper
lobe. Airspace opacity in the right middle lobe. Bilateral airway
thickening.

Musculoskeletal: Lower cervical and thoracic spondylosis.

CT ABDOMEN PELVIS FINDINGS

Hepatobiliary: Unremarkable

Pancreas: Unremarkable

Spleen: Unremarkable

Adrenals/Urinary Tract: Adrenal glands normal. Right kidney upper
pole benign-appearing cyst 1.4 cm on image 69/2. Left mid to lower
kidney 2.2 cm benign-appearing cyst on image 79/2. Several
additional hypodense renal lesions are technically too small to
characterize although statistically likely to be cysts.

Stomach/Bowel: Descending colon and proximal sigmoid colon
diverticulosis. No active diverticulitis identified.

Vascular/Lymphatic: Aortoiliac atherosclerotic vascular disease. No
adenopathy identified.

Reproductive: Several punctate calcifications in the central gland.

Other: Trace ascites along the left paracolic gutter

Musculoskeletal: Degenerative arthropathy of both hips. Lumbar
spondylosis and degenerative disc disease.
IMPRESSION: 1. No adenopathy or mass is identified in the chest, abdomen, or
pelvis.
2. Large bilateral pleural effusions with passive atelectasis. There
is some scattered scarring and nodularity in both lungs along with
peripheral interstitial accentuation which may reflect mild
fibrosis.
3. Trace ascites in the left paracolic gutter.
4. Airway thickening is present, suggesting bronchitis or reactive
airways disease.
5. Other imaging findings of potential clinical significance:
Coronary, aortic arch, and branch vessel atherosclerotic vascular
disease. Bilateral chronic appearing maxillary sinusitis. Cervical,
thoracic, and lumbar spondylosis. Degenerative arthropathy of both
hips. Bilateral renal cysts. Descending and proximal sigmoid colon
diverticulosis. Aortoiliac atherosclerotic vascular disease.

## 2018-05-14 IMAGING — DX DG CHEST 1V PORT
1 series · 1 of 1 positions shown · non-contrast
Comparison: 07/19/2016

CLINICAL DATA: Follow-up pneumonia, check chest tube status

EXAM:
PORTABLE CHEST 1 VIEW

[chest ap]
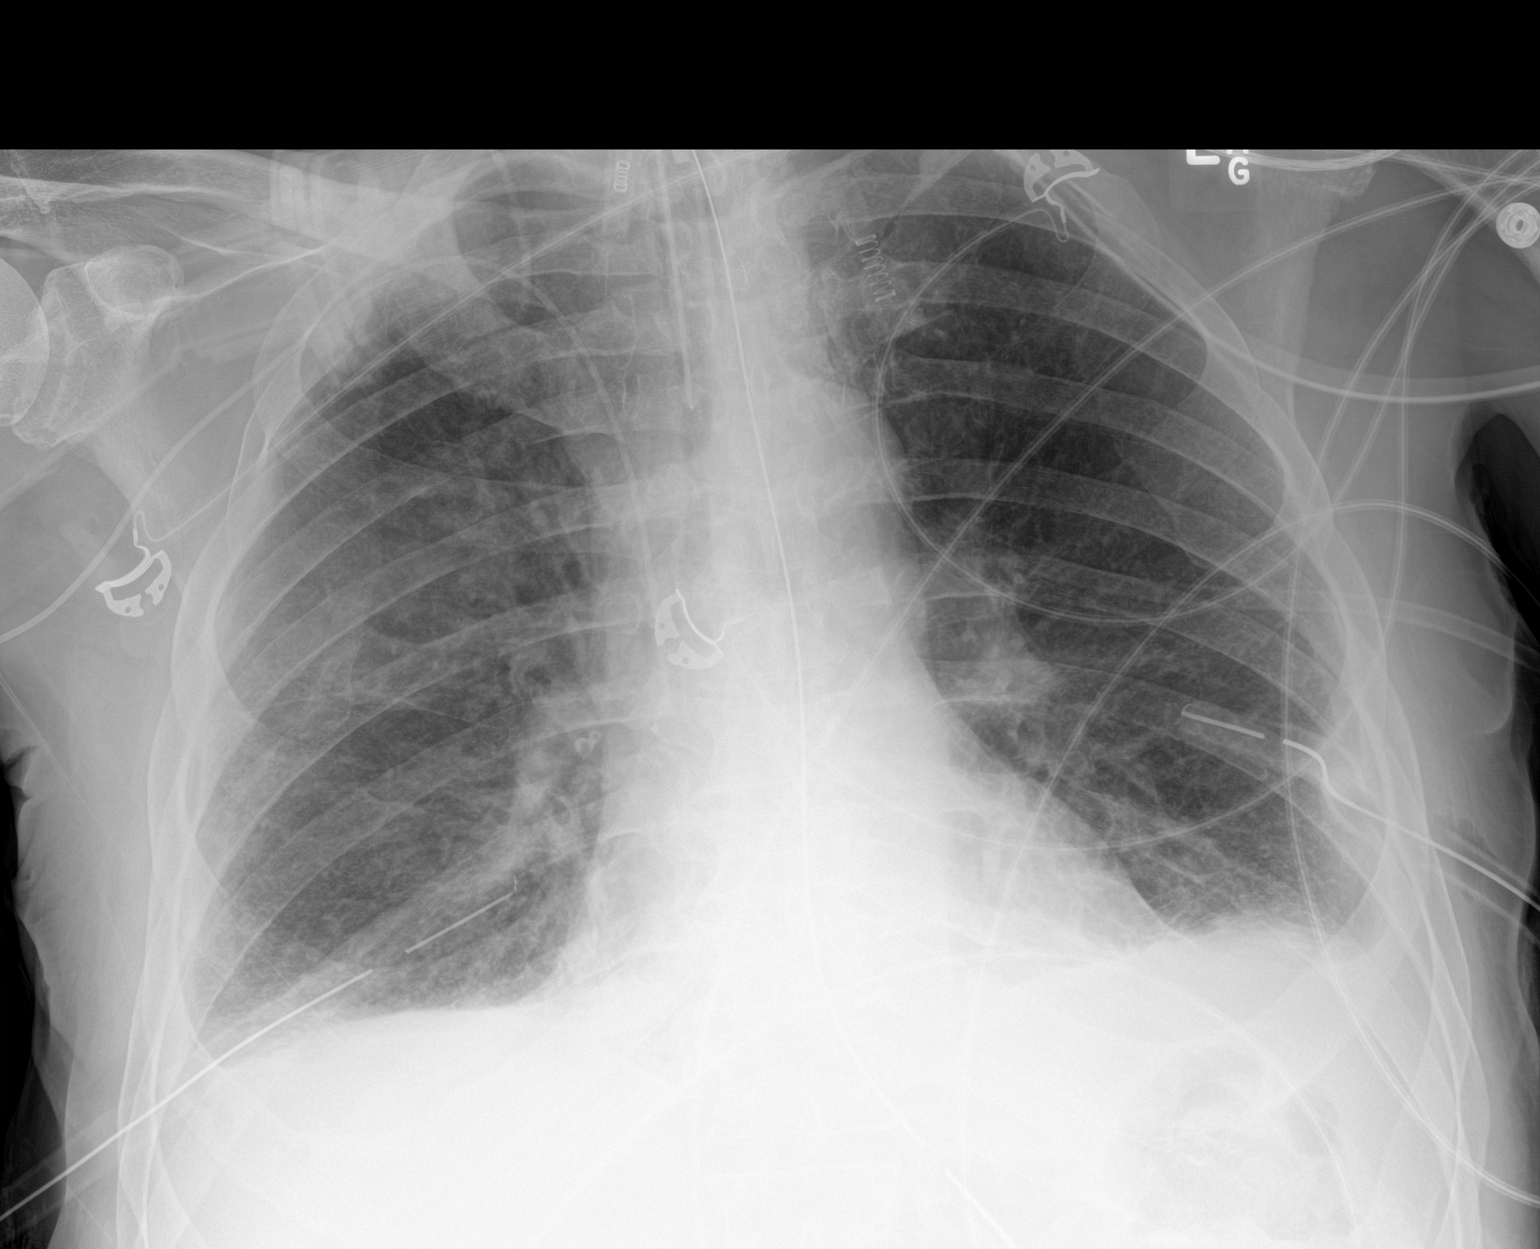

[1 of 1 positions shown; findings below may reference images not displayed]

FINDINGS: Cardiac shadow is stable. An endotracheal tube, nasogastric catheter
and right-sided PICC line are again seen and stable. Bilateral
thoracostomy catheters are noted. No pneumothorax is seen. Minimal
bibasilar atelectasis is noted.
IMPRESSION: No significant change from the prior exam.

## 2018-05-16 IMAGING — DX DG CHEST 1V PORT
1 series · 1 of 1 positions shown · non-contrast
Comparison: July 21, 2016

CLINICAL DATA: Pleural effusion

EXAM:
PORTABLE CHEST 1 VIEW

[chest ap]
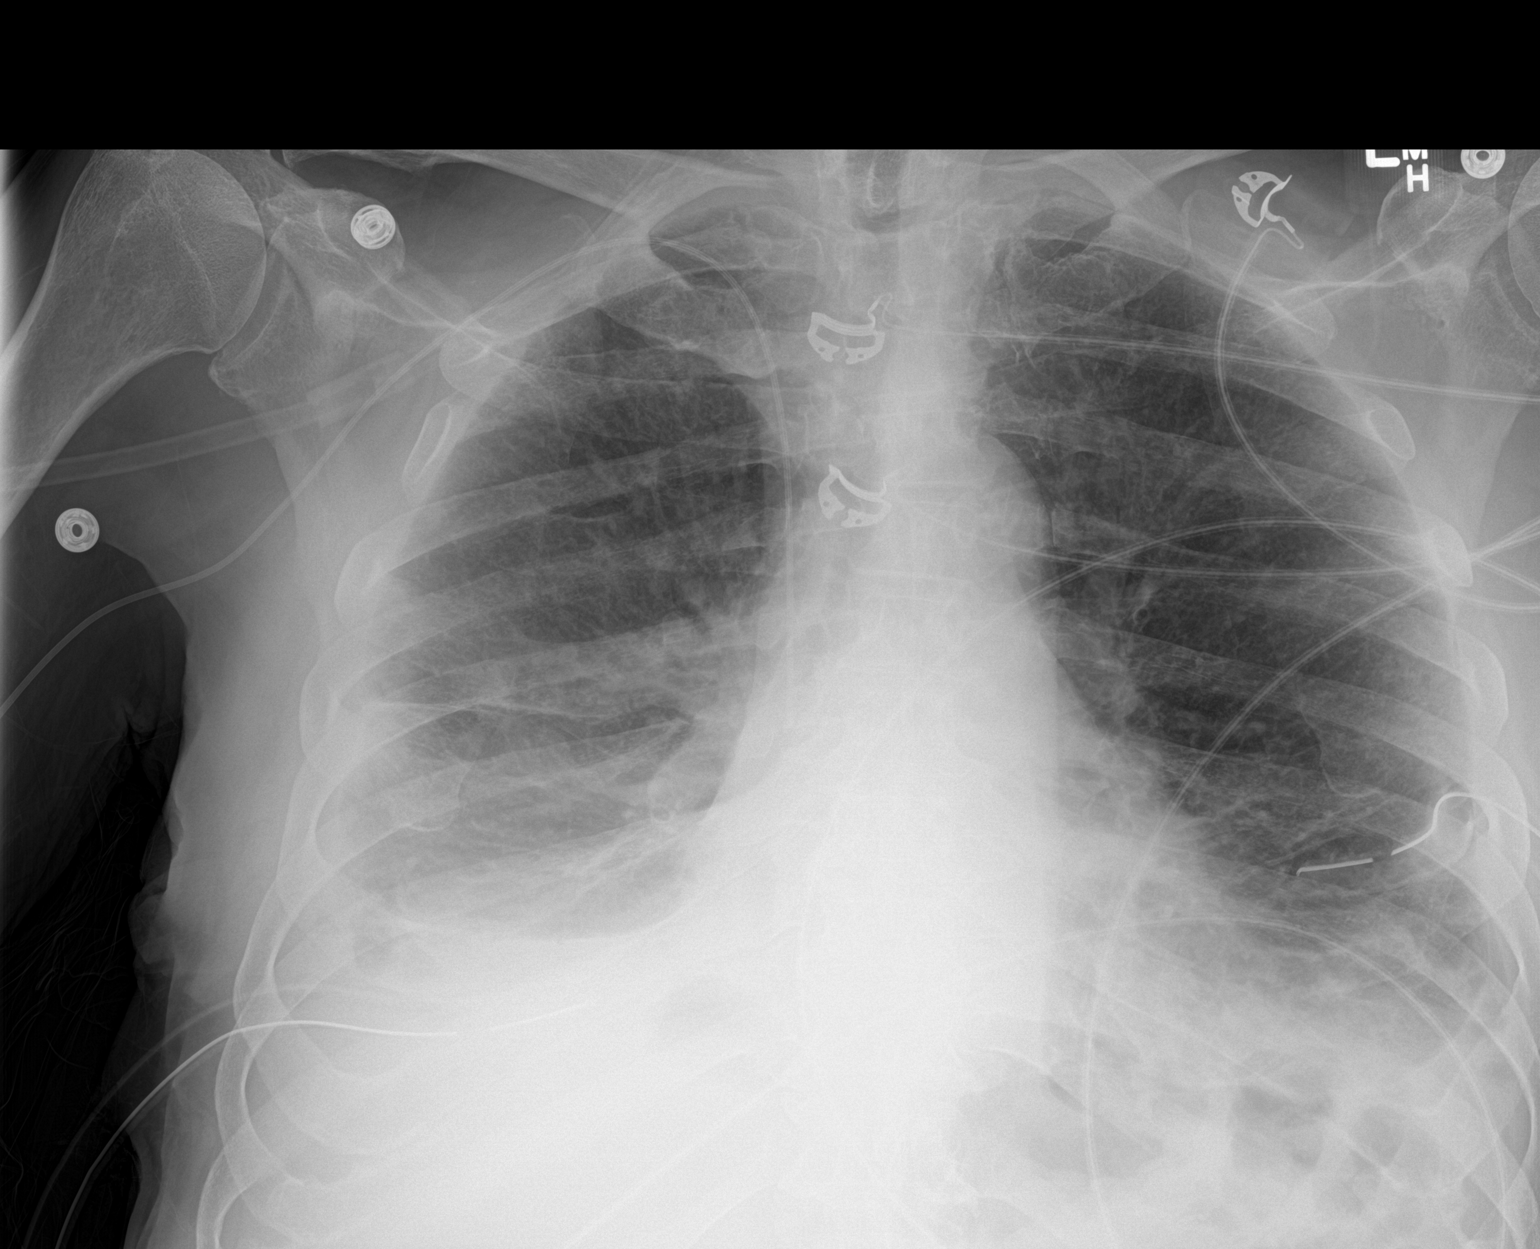

[1 of 1 positions shown; findings below may reference images not displayed]

FINDINGS: Central catheter tip is in the superior vena cava. There are chest
tubes bilaterally. No pneumothorax. There are bilateral pleural
effusions, stable. There is bibasilar atelectasis. No consolidation
evident. Heart is upper normal in size with pulmonary vascularity
within normal limits. No bone lesions.
IMPRESSION: Tube and catheter positions as described without evident
pneumothorax. Small pleural effusions and bibasilar atelectasis
persist. No new opacity. Stable cardiac silhouette.
